# Patient Record
Sex: Male | Born: 1955 | Race: White | Hispanic: No | Marital: Married | Smoking: Current some day smoker
Health system: Southern US, Community
[De-identification: ages and names within clinical notes are randomized; demographics above are authoritative.]

## PROBLEM LIST (undated history)

## (undated) DIAGNOSIS — S43109A Unspecified dislocation of unspecified acromioclavicular joint, initial encounter: Secondary | ICD-10-CM

## (undated) DIAGNOSIS — S92309A Fracture of unspecified metatarsal bone(s), unspecified foot, initial encounter for closed fracture: Secondary | ICD-10-CM

## (undated) DIAGNOSIS — R55 Syncope and collapse: Secondary | ICD-10-CM

## (undated) DIAGNOSIS — I639 Cerebral infarction, unspecified: Secondary | ICD-10-CM

## (undated) DIAGNOSIS — I2510 Atherosclerotic heart disease of native coronary artery without angina pectoris: Secondary | ICD-10-CM

## (undated) DIAGNOSIS — Z720 Tobacco use: Secondary | ICD-10-CM

## (undated) HISTORY — PX: MANDIBLE RECONSTRUCTION: SHX431

## (undated) HISTORY — PX: TONSILLECTOMY: SUR1361

---

## 2012-06-21 ENCOUNTER — Emergency Department: Payer: Self-pay | Admitting: Emergency Medicine

## 2012-07-18 ENCOUNTER — Emergency Department: Payer: Self-pay | Admitting: Internal Medicine

## 2016-05-09 DIAGNOSIS — S43109A Unspecified dislocation of unspecified acromioclavicular joint, initial encounter: Secondary | ICD-10-CM | POA: Insufficient documentation

## 2016-05-21 DIAGNOSIS — S43102A Unspecified dislocation of left acromioclavicular joint, initial encounter: Secondary | ICD-10-CM | POA: Insufficient documentation

## 2016-09-10 ENCOUNTER — Encounter: Payer: Self-pay | Admitting: *Deleted

## 2016-09-10 NOTE — Patient Instructions (Signed)
  Your procedure is scheduled on: 09-11-16 Report to Same Day Surgery 2nd Floor @ 8:30-PT INFORMED OF THIS   Remember: Instructions that are not followed completely may result in serious medical risk, up to and including death, or upon the discretion of your surgeon and anesthesiologist your surgery may need to be rescheduled.    _x___ 1. Do not eat food or drink liquids after midnight. No gum chewing or hard candies.     __x__ 2. No Alcohol for 24 hours before or after surgery.   __x__3. No Smoking for 24 prior to surgery.   ____  4. Bring all medications with you on the day of surgery if instructed.    __x__ 5. Notify your doctor if there is any change in your medical condition     (cold, fever, infections).     Do not wear jewelry, make-up, hairpins, clips or nail polish.  Do not wear lotions, powders, or perfumes. You may wear deodorant.  Do not shave 48 hours prior to surgery. Men may shave face and neck.  Do not bring valuables to the hospital.    [facility] is not responsible for any belongings or valuables.               Contacts, dentures or bridgework may not be worn into surgery.  Leave your suitcase in the car. After surgery it may be brought to your room.  For patients admitted to the hospital, discharge time is determined by your treatment team.   Patients discharged the day of surgery will not be allowed to drive home.    Please read over the following fact sheets that you were given:   [facility] Preparing for Surgery and or MRSA Information   ____ Take these medicines the morning of surgery with A SIP OF WATER:    1. NONE  2.  3.  4.  5.  6.  ____Fleets enema or Magnesium Citrate as directed.   ____ Use CHG Soap or sage wipes as directed on instruction sheet   ____ Use inhalers on the day of surgery and bring to hospital day of surgery  ____ Stop metformin 2 days prior to surgery    ____ Take 1/2 of usual insulin dose the night before surgery and  none on the morning of surgery.   ____ Stop aspirin or coumadin, or plavix  x__ Stop Anti-inflammatories such as Advil, Aleve, Ibuprofen, Motrin, Naproxen,          Naprosyn, Goodies powders or aspirin products. Ok to take Tylenol.   ____ Stop supplements until after surgery.    ____ Bring C-Pap to the hospital.

## 2016-09-10 NOTE — Pre-Procedure Instructions (Signed)
ECG 12-lead6/10/2016 [place] Component Name Value Ref Range  Vent Rate (bpm) 95   PR Interval (msec) 128   QRS Interval (msec) 86   QT Interval (msec) 358   QTc (msec) 449   Result Narrative  Sinus rhythm Multiple ventricular premature complexes Otherwise normal ECG  No previous ECGs available I reviewed and concur with this report. Electronically signed [id number], MD, AUGUSTUS ([id number]) on 05/06/2016 9:49:50 AM  Status Results Details    Hospital Encounter on 05/03/2016 [place]")' [email or web address] Summary

## 2016-09-11 ENCOUNTER — Encounter: Payer: Self-pay | Admitting: Anesthesiology

## 2016-09-11 ENCOUNTER — Ambulatory Visit
Admission: RE | Admit: 2016-09-11 | Discharge: 2016-09-11 | Disposition: A | Discharge status: Home / Self Care | Payer: Medicaid Other | Source: Ambulatory Visit | Attending: Surgery | Admitting: Surgery

## 2016-09-11 ENCOUNTER — Ambulatory Visit: Payer: Medicaid Other | Admitting: Anesthesiology

## 2016-09-11 ENCOUNTER — Encounter
Admission: RE | Disposition: A | Discharge status: Home / Self Care | Payer: Self-pay | Source: Ambulatory Visit | Attending: Surgery

## 2016-09-11 ENCOUNTER — Ambulatory Visit: Payer: Medicaid Other

## 2016-09-11 DIAGNOSIS — M24812 Other specific joint derangements of left shoulder, not elsewhere classified: Secondary | ICD-10-CM | POA: Insufficient documentation

## 2016-09-11 DIAGNOSIS — F17200 Nicotine dependence, unspecified, uncomplicated: Secondary | ICD-10-CM | POA: Insufficient documentation

## 2016-09-11 DIAGNOSIS — Z79899 Other long term (current) drug therapy: Secondary | ICD-10-CM | POA: Diagnosis not present

## 2016-09-11 DIAGNOSIS — Z419 Encounter for procedure for purposes other than remedying health state, unspecified: Secondary | ICD-10-CM

## 2016-09-11 HISTORY — PX: ACROMIO-CLAVICULAR JOINT REPAIR: SHX5183

## 2016-09-11 SURGERY — REPAIR, ACROMIOCLAVICULAR JOINT
Anesthesia: General | Laterality: Left | Wound class: Clean

## 2016-09-11 MED ORDER — PROPOFOL 10 MG/ML IV BOLUS
INTRAVENOUS | Status: DC | PRN
  Administered 2016-09-11: 200 mg via INTRAVENOUS

## 2016-09-11 MED ORDER — ONDANSETRON HCL 4 MG/2ML IJ SOLN: 4.0000 mg | Freq: Once | INTRAMUSCULAR | Status: DC | PRN

## 2016-09-11 MED ORDER — LIDOCAINE HCL (CARDIAC) 20 MG/ML IV SOLN
INTRAVENOUS | Status: DC | PRN
  Administered 2016-09-11: 50 mg via INTRAVENOUS

## 2016-09-11 MED ORDER — CEFAZOLIN SODIUM-DEXTROSE 2-4 GM/100ML-% IV SOLN
INTRAVENOUS | Status: AC
  Filled 2016-09-11: qty 100

## 2016-09-11 MED ORDER — ROCURONIUM BROMIDE 100 MG/10ML IV SOLN
INTRAVENOUS | Status: DC | PRN
  Administered 2016-09-11: 10 mg via INTRAVENOUS
  Administered 2016-09-11: 20 mg via INTRAVENOUS
  Administered 2016-09-11: 40 mg via INTRAVENOUS

## 2016-09-11 MED ORDER — FAMOTIDINE 20 MG PO TABS
20.0000 mg | ORAL_TABLET | Freq: Once | ORAL | Status: AC
  Administered 2016-09-11: 20 mg via ORAL

## 2016-09-11 MED ORDER — CEFAZOLIN SODIUM-DEXTROSE 2-4 GM/100ML-% IV SOLN
2.0000 g | Freq: Once | INTRAVENOUS | Status: AC
  Administered 2016-09-11: 2 g via INTRAVENOUS

## 2016-09-11 MED ORDER — FENTANYL CITRATE (PF) 100 MCG/2ML IJ SOLN: 25.0000 ug | INTRAMUSCULAR | Status: DC | PRN

## 2016-09-11 MED ORDER — MIDAZOLAM HCL 2 MG/2ML IJ SOLN
1.0000 mg | Freq: Once | INTRAMUSCULAR | Status: AC
  Administered 2016-09-11: 1 mg via INTRAVENOUS

## 2016-09-11 MED ORDER — ACETAMINOPHEN 10 MG/ML IV SOLN
INTRAVENOUS | Status: AC
  Filled 2016-09-11: qty 100

## 2016-09-11 MED ORDER — FAMOTIDINE 20 MG PO TABS
ORAL_TABLET | ORAL | Status: AC
  Administered 2016-09-11: 20 mg via ORAL
  Filled 2016-09-11: qty 1

## 2016-09-11 MED ORDER — OXYCODONE HCL 5 MG PO TABS: 5.0000 mg | ORAL_TABLET | ORAL | 0 refills | Status: DC | PRN

## 2016-09-11 MED ORDER — FENTANYL CITRATE (PF) 100 MCG/2ML IJ SOLN
50.0000 ug | Freq: Once | INTRAMUSCULAR | Status: AC
  Administered 2016-09-11: 50 ug via INTRAVENOUS

## 2016-09-11 MED ORDER — HYDROMORPHONE HCL 1 MG/ML IJ SOLN
INTRAMUSCULAR | Status: DC | PRN
  Administered 2016-09-11: 0.5 mg via INTRAVENOUS

## 2016-09-11 MED ORDER — PHENYLEPHRINE HCL 10 MG/ML IJ SOLN
INTRAMUSCULAR | Status: DC | PRN
Start: 2016-09-11 — End: 2016-09-11
  Administered 2016-09-11 (×2): 100 ug via INTRAVENOUS
  Administered 2016-09-11 (×4): 200 ug via INTRAVENOUS

## 2016-09-11 MED ORDER — FENTANYL CITRATE (PF) 100 MCG/2ML IJ SOLN
INTRAMUSCULAR | Status: AC
  Administered 2016-09-11: 50 ug via INTRAVENOUS
  Filled 2016-09-11: qty 2

## 2016-09-11 MED ORDER — NEOMYCIN-POLYMYXIN B GU 40-200000 IR SOLN
Status: DC | PRN
  Administered 2016-09-11: 4 mL

## 2016-09-11 MED ORDER — SODIUM CHLORIDE 0.9 % IV SOLN
INTRAVENOUS | Status: DC | PRN
Start: 2016-09-11 — End: 2016-09-11
  Administered 2016-09-11: 50 ug/min via INTRAVENOUS

## 2016-09-11 MED ORDER — MIDAZOLAM HCL 5 MG/5ML IJ SOLN
INTRAMUSCULAR | Status: AC
  Administered 2016-09-11: 1 mg via INTRAVENOUS
  Filled 2016-09-11: qty 5

## 2016-09-11 MED ORDER — LACTATED RINGERS IV SOLN
INTRAVENOUS | Status: DC | PRN
  Administered 2016-09-11 (×2): via INTRAVENOUS

## 2016-09-11 MED ORDER — DEXAMETHASONE SODIUM PHOSPHATE 10 MG/ML IJ SOLN
INTRAMUSCULAR | Status: DC | PRN
  Administered 2016-09-11: 10 mg via INTRAVENOUS

## 2016-09-11 MED ORDER — BUPIVACAINE-EPINEPHRINE (PF) 0.5% -1:200000 IJ SOLN
INTRAMUSCULAR | Status: DC | PRN
  Administered 2016-09-11: 30 mL via PERINEURAL

## 2016-09-11 MED ORDER — BUPIVACAINE-EPINEPHRINE (PF) 0.5% -1:200000 IJ SOLN
INTRAMUSCULAR | Status: AC
  Filled 2016-09-11: qty 30

## 2016-09-11 MED ORDER — SUGAMMADEX SODIUM 200 MG/2ML IV SOLN
INTRAVENOUS | Status: DC | PRN
  Administered 2016-09-11: 130 mg via INTRAVENOUS

## 2016-09-11 MED ORDER — ACETAMINOPHEN 10 MG/ML IV SOLN
INTRAVENOUS | Status: DC | PRN
  Administered 2016-09-11: 1000 mg via INTRAVENOUS

## 2016-09-11 MED ORDER — LACTATED RINGERS IV SOLN
INTRAVENOUS | Status: DC
  Administered 2016-09-11: 09:00:00 via INTRAVENOUS

## 2016-09-11 MED ORDER — ONDANSETRON HCL 4 MG/2ML IJ SOLN
INTRAMUSCULAR | Status: DC | PRN
  Administered 2016-09-11: 4 mg via INTRAVENOUS

## 2016-09-11 MED ORDER — NEOMYCIN-POLYMYXIN B GU 40-200000 IR SOLN
Status: AC
  Filled 2016-09-11: qty 4

## 2016-09-11 MED ORDER — MIDAZOLAM HCL 2 MG/2ML IJ SOLN
INTRAMUSCULAR | Status: DC | PRN
  Administered 2016-09-11: 2 mg via INTRAVENOUS

## 2016-09-11 SURGICAL SUPPLY — 58 items
Acu-Sinch Repair System ×3 IMPLANT
BANDAGE ACE 4X5 VEL STRL LF (GAUZE/BANDAGES/DRESSINGS) ×6 IMPLANT
BIT DRILL 2.5 X LONG (BIT) ×1
BIT DRILL X LONG 2.5 (BIT) ×1 IMPLANT
BLADE SAW 9.0X18.5X0.4 (BLADE) ×3 IMPLANT
BNDG COHESIVE 4X5 TAN STRL (GAUZE/BANDAGES/DRESSINGS) ×3 IMPLANT
CANISTER SUCT 1200ML W/VALVE (MISCELLANEOUS) ×3 IMPLANT
CHLORAPREP W/TINT 26ML (MISCELLANEOUS) ×6 IMPLANT
DRAPE C-ARM XRAY 36X54 (DRAPES) ×9 IMPLANT
DRAPE IMP U-DRAPE 54X76 (DRAPES) ×6 IMPLANT
DRAPE INCISE IOBAN 66X45 STRL (DRAPES) ×6 IMPLANT
DRAPE U-SHAPE 47X51 STRL (DRAPES) ×3 IMPLANT
DRILL BIT X LONG 2.5 (BIT) ×2
DRSG OPSITE POSTOP 4X10 (GAUZE/BANDAGES/DRESSINGS) ×3 IMPLANT
DRSG OPSITE POSTOP 4X8 (GAUZE/BANDAGES/DRESSINGS) ×6 IMPLANT
ELECT CAUTERY BLADE 6.4 (BLADE) ×3 IMPLANT
ELECT REM PT RETURN 9FT ADLT (ELECTROSURGICAL) ×3
ELECTRODE REM PT RTRN 9FT ADLT (ELECTROSURGICAL) ×1 IMPLANT
GAUZE PETRO XEROFOAM 1X8 (MISCELLANEOUS) ×3 IMPLANT
GAUZE SPONGE 4X4 12PLY STRL (GAUZE/BANDAGES/DRESSINGS) ×3 IMPLANT
GLOVE BIO SURGEON STRL SZ7.5 (GLOVE) ×6 IMPLANT
GLOVE BIO SURGEON STRL SZ8 (GLOVE) ×6 IMPLANT
GLOVE BIOGEL PI IND STRL 8 (GLOVE) ×1 IMPLANT
GLOVE BIOGEL PI INDICATOR 8 (GLOVE) ×2
GLOVE INDICATOR 8.0 STRL GRN (GLOVE) ×3 IMPLANT
GOWN STRL REUS W/ TWL LRG LVL3 (GOWN DISPOSABLE) ×2 IMPLANT
GOWN STRL REUS W/ TWL XL LVL3 (GOWN DISPOSABLE) ×1 IMPLANT
GOWN STRL REUS W/TWL LRG LVL3 (GOWN DISPOSABLE) ×4
GOWN STRL REUS W/TWL XL LVL3 (GOWN DISPOSABLE) ×2
HANDLE YANKAUER SUCT BULB TIP (MISCELLANEOUS) ×3 IMPLANT
IMMOBILIZER SHDR LG LX 900803 (SOFTGOODS) ×3 IMPLANT
KIT ACU SINCH FIX STRL (KITS) ×3
KIT RM TURNOVER STRD PROC AR (KITS) ×3 IMPLANT
KIT STABILIZATION SHOULDER (MISCELLANEOUS) ×3 IMPLANT
MASK FACE SPIDER DISP (MASK) ×3 IMPLANT
NEEDLE HYPO 25X1 1.5 SAFETY (NEEDLE) ×3 IMPLANT
NEEDLE MAYO 6 CRC TAPER PT (NEEDLE) IMPLANT
NS IRRIG 1000ML POUR BTL (IV SOLUTION) ×3 IMPLANT
PACK ARTHROSCOPY SHOULDER (MISCELLANEOUS) ×3 IMPLANT
PAD CAST CTTN 4X4 STRL (SOFTGOODS) IMPLANT
PADDING CAST COTTON 4X4 STRL (SOFTGOODS)
PLATE 3.5MM 4H 18MM HOOK (Plate) ×3 IMPLANT
RETRIEVER SUT LRG (INSTRUMENTS) ×3 IMPLANT
SCREW CORTEX 3.5 18MM (Screw) ×4 IMPLANT
SCREW CORTEX 3.5 20MM (Screw) ×6 IMPLANT
SCREW LOCK CORT ST 3.5X18 (Screw) ×2 IMPLANT
SCREW LOCK CORT ST 3.5X20 (Screw) ×3 IMPLANT
SET ACU-SINCH FIX REPAIR STRL (KITS) ×1 IMPLANT
SLING ULTRA II LG (MISCELLANEOUS) ×3 IMPLANT
STAPLER SKIN PROX 35W (STAPLE) ×3 IMPLANT
STOCKINETTE IMPERVIOUS 9X36 MD (GAUZE/BANDAGES/DRESSINGS) ×3 IMPLANT
SUT ETHIBOND 0 MO6 C/R (SUTURE) ×3 IMPLANT
SUT FIBERWIRE #2 38 BLUE 1/2 (SUTURE)
SUT PROLENE 4 0 PS 2 18 (SUTURE) ×6 IMPLANT
SUT VIC AB 2-0 CT1 27 (SUTURE) ×8
SUT VIC AB 2-0 CT1 TAPERPNT 27 (SUTURE) ×4 IMPLANT
SUTURE FIBERWR #2 38 BLUE 1/2 (SUTURE) IMPLANT
SYRINGE 10CC LL (SYRINGE) ×3 IMPLANT

## 2016-09-11 NOTE — Anesthesia Preprocedure Evaluation (Addendum)
Anesthesia Evaluation  Patient identified by MRN, date of birth, ID band Patient awake    Reviewed: Allergy & Precautions, NPO status , Patient's Chart, lab work & pertinent test results  Airway Mallampati: II  TM Distance: >3 FB     Dental  (+) Poor Dentition   Pulmonary Current Smoker,    Pulmonary exam normal        Cardiovascular negative cardio ROS Normal cardiovascular exam     Neuro/Psych negative psych ROS   GI/Hepatic negative GI ROS, Neg liver ROS,   Endo/Other  negative endocrine ROS  Renal/GU negative Renal ROS  negative genitourinary   Musculoskeletal negative musculoskeletal ROS (+)   Abdominal Normal abdominal exam  (+)   Peds negative pediatric ROS (+)  Hematology negative hematology ROS (+)   Anesthesia Other Findings History reviewed. No pertinent past medical history.  Reproductive/Obstetrics                            Anesthesia Physical Anesthesia Plan  ASA: II  Anesthesia Plan: General   Post-op Pain Management:  Regional for Post-op pain   Induction: Intravenous  Airway Management Planned: Oral ETT  Additional Equipment:   Intra-op Plan:   Post-operative Plan: Extubation in OR  Informed Consent: I have reviewed the patients History and Physical, chart, labs and discussed the procedure including the risks, benefits and alternatives for the proposed anesthesia with the patient or authorized representative who has indicated his/her understanding and acceptance.   Dental advisory given  Plan Discussed with: CRNA and Surgeon  Anesthesia Plan Comments: (Talked with patient about interscalene nerve block for post op pain.  Patient understands risks and agrees with the procedure)       Anesthesia Quick Evaluation

## 2016-09-11 NOTE — Anesthesia Postprocedure Evaluation (Signed)
Anesthesia Post Note  Patient: Kyle Larson  Procedure(s) Performed: Procedure(s) (LRB): ACROMIO-CLAVICULAR RECONSTRUCTION (Left)  Patient location during evaluation: PACU Anesthesia Type: General Level of consciousness: awake and alert and oriented Pain management: pain level controlled Vital Signs Assessment: post-procedure vital signs reviewed and stable Respiratory status: spontaneous breathing Cardiovascular status: blood pressure returned to baseline Anesthetic complications: no    Last Vitals:  Vitals:   09/11/16 1358 09/11/16 1408  BP: 140/84 (!) 150/85  Pulse: 95 (!) 101  Resp: (!) 22 16  Temp: 36.7 C 36.8 C    Last Pain:  Vitals:   09/11/16 1408  TempSrc: Temporal  PainSc:                  [name]

## 2016-09-11 NOTE — Anesthesia Procedure Notes (Signed)
Anesthesia Regional Block:  Interscalene brachial plexus block  Pre-Anesthetic Checklist: ,, timeout performed, Correct Patient, Correct Site, Correct Laterality, Correct Procedure, Correct Position, site marked, Risks and benefits discussed,  Surgical consent,  Pre-op evaluation,  At surgeon's request and post-op pain management  Laterality: Left  Prep: Maximum Sterile Barrier Precautions used, chloraprep, alcohol swabs       Needles:  Injection technique: Single-shot  Needle Type: Stimiplex     Needle Length: 5cm 5 cm Needle Gauge: 22 and 22 G    Additional Needles:  Procedures: ultrasound guided (picture in chart) and nerve stimulator Interscalene brachial plexus block  Nerve Stimulator or Paresthesia:  Response: biceps flexion, 0.5 mA,   Additional Responses:   Narrative:  Start time: 09/11/2016 10:25 AM End time: 09/11/2016 10:35 AM Injection made incrementally with aspirations every 5 mL.  Performed by: Personally  Anesthesiologist: Alvin Critchley  Additional Notes: Time out was called.  A roll was placed under the left side of the back to present the left neck.  The left neck was prepped and draped in sterile fashion.  An US probe was used to identify the brachial plexus in the typical stoplight position at approximately the C6 position.  A skin wheal was made lateral to the probe with 1% Lidocaine plain.  A 22G stimuplex needle was advanced to the nerve bundle with the elicitation of a biceps twitch and fade at 0.5 mAmps.  Incremental injection of 30 cc of a mixture of 0.5% and 0.2% Ropivaciane plain was used with easy injection and good spread.  The patient had no pain on injection and the local was easy to inject.  Patient tolerated the procedure well.
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
## 2016-09-11 NOTE — Discharge Instructions (Addendum)
May shower with intact OpSite dressing.  Apply ice frequently to shoulder. Take ibuprofen 800 mg TID with meals for 7-10 days, then as necessary. Take oxycodone as prescribed when needed.  May supplement with ES Tylenol if necessary. Keep shoulder immobilizer on at all times except may remove for bathing purposes. Follow-up in 10-14 days or as scheduled.   AMBULATORY SURGERY  DISCHARGE INSTRUCTIONS   1) The drugs that you were given will stay in your system until tomorrow so for the next 24 hours you should not:  A) Drive an automobile B) Make any legal decisions C) Drink any alcoholic beverage   2) You may resume regular meals tomorrow.  Today it is better to start with liquids and gradually work up to solid foods.  You may eat anything you prefer, but it is better to start with liquids, then soup and crackers, and gradually work up to solid foods.   3) Please notify your doctor immediately if you have any unusual bleeding, trouble breathing, redness and pain at the surgery site, drainage, fever, or pain not relieved by medication.    4) Additional Instructions:        Please contact your physician with any problems or Same Day Surgery at [phone], Monday through Friday 6 am to 4 pm, or [place] [facility] number at [phone].

## 2016-09-11 NOTE — Anesthesia Procedure Notes (Signed)
Procedure Name: Intubation Date/Time: 09/11/2016 10:59 AM Performed by: Darlyne Russian Pre-anesthesia Checklist: Patient identified, Emergency Drugs available, Suction available and Patient being monitored Patient Re-evaluated:Patient Re-evaluated prior to inductionOxygen Delivery Method: Circle system utilized Preoxygenation: Pre-oxygenation with 100% oxygen Intubation Type: IV induction Ventilation: Mask ventilation without difficulty Laryngoscope Size: Mac and 4 Grade View: Grade II Tube type: Oral Tube size: 7.5 mm Number of attempts: 1 Airway Equipment and Method: Stylet (Soft rolled gauze bite block placed between left molars.) Placement Confirmation: ETT inserted through vocal cords under direct vision,  positive ETCO2 and breath sounds checked- equal and bilateral Secured at: 22 cm Tube secured with: Tape Dental Injury: Teeth and Oropharynx as per pre-operative assessment

## 2016-09-11 NOTE — H&P (Signed)
Paper H&P to be scanned into permanent record. H&P reviewed. No changes. 

## 2016-09-11 NOTE — Transfer of Care (Signed)
Immediate Anesthesia Transfer of Care Note  Patient: Kyle Larson  Procedure(s) Performed: Procedure(s) with comments: ACROMIO-CLAVICULAR RECONSTRUCTION (Left) - clavicle  Patient Location: PACU  Anesthesia Type:General and Regional  Level of Consciousness: unresponsive  Airway & Oxygen Therapy: Patient Spontanous Breathing and Patient connected to nasal cannula oxygen  Post-op Assessment: Report given to RN and Post -op Vital signs reviewed and stable  Post vital signs: Reviewed and stable  Last Vitals:  Vitals:   09/11/16 1039 09/11/16 1313  BP: (!) 137/95 120/79  Pulse: 89 73  Resp: (!) 21 12  Temp:  36.3 C    Last Pain:  Vitals:   09/11/16 1313  TempSrc:   PainSc: Asleep         Complications: No apparent anesthesia complications

## 2016-09-12 ENCOUNTER — Encounter: Payer: Self-pay | Admitting: Surgery

## 2016-09-15 ENCOUNTER — Encounter: Payer: Self-pay | Admitting: Surgery

## 2016-09-15 LAB — SURGICAL PATHOLOGY

## 2016-10-03 ENCOUNTER — Emergency Department
Admission: EM | Admit: 2016-10-03 | Discharge: 2016-10-03 | Disposition: A | Discharge status: Home / Self Care | Payer: Medicaid Other | Attending: Emergency Medicine | Admitting: Emergency Medicine

## 2016-10-03 ENCOUNTER — Emergency Department: Payer: Medicaid Other

## 2016-10-03 DIAGNOSIS — F17210 Nicotine dependence, cigarettes, uncomplicated: Secondary | ICD-10-CM | POA: Diagnosis not present

## 2016-10-03 DIAGNOSIS — Z79899 Other long term (current) drug therapy: Secondary | ICD-10-CM | POA: Insufficient documentation

## 2016-10-03 DIAGNOSIS — R079 Chest pain, unspecified: Secondary | ICD-10-CM

## 2016-10-03 DIAGNOSIS — R0602 Shortness of breath: Secondary | ICD-10-CM | POA: Insufficient documentation

## 2016-10-03 DIAGNOSIS — R0789 Other chest pain: Secondary | ICD-10-CM | POA: Diagnosis not present

## 2016-10-03 LAB — CBC
HCT: 47.6 % (ref 40.0–52.0)
Hemoglobin: 16.5 g/dL (ref 13.0–18.0)
MCH: 34.2 pg — AB (ref 26.0–34.0)
MCHC: 34.7 g/dL (ref 32.0–36.0)
MCV: 98.7 fL (ref 80.0–100.0)
PLATELETS: 263 10*3/uL (ref 150–440)
RBC: 4.82 MIL/uL (ref 4.40–5.90)
RDW: 13.5 % (ref 11.5–14.5)
WBC: 9.5 10*3/uL (ref 3.8–10.6)

## 2016-10-03 LAB — BASIC METABOLIC PANEL
Anion gap: 9 (ref 5–15)
BUN: 6 mg/dL (ref 6–20)
CHLORIDE: 98 mmol/L — AB (ref 101–111)
CO2: 27 mmol/L (ref 22–32)
CREATININE: 0.74 mg/dL (ref 0.61–1.24)
Calcium: 9.7 mg/dL (ref 8.9–10.3)
GFR calc non Af Amer: >60 mL/min (ref >60)
Glucose, Bld: 124 mg/dL — ABNORMAL HIGH (ref 65–99)
Potassium: 4.5 mmol/L (ref 3.5–5.1)
Sodium: 134 mmol/L — ABNORMAL LOW (ref 135–145)

## 2016-10-03 LAB — TROPONIN I
Troponin I: <0.03 ng/mL (ref <0.03)
Troponin I: <0.03 ng/mL (ref <0.03)

## 2016-10-03 NOTE — ED Notes (Addendum)
Pt reports chest pain and shortness of breath - symptoms started last night - chest pain is located in center of chest - pt reports he feels like his heart is going to jump out of his chest and reports pressure to center of chest - pt father had nitro and he took one tablet with relief obtained - pt reports elevated pulse last night - shortness of breath is noted to be worse with exertion but pt reports he is unable to catch his breath since this am - reports being light headed - at this time pt respirations are even and unlabored and he appears in no acute respiratory distress
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
## 2016-10-03 NOTE — Discharge Instructions (Signed)
Please seek medical attention for any high fevers, chest pain, shortness of breath, change in behavior, persistent vomiting, bloody stool or any other new or concerning symptoms.  

## 2016-10-03 NOTE — ED Triage Notes (Signed)
Pt c/o SOB and centralized CP that began last night. Pt feels like he can't take a deep breath. Pt alert and oriented X4, active, cooperative, pt in NAD. RR even and unlabored, color WNL.

## 2016-10-03 NOTE — ED Notes (Signed)
MD Archie Balboa notified of elevated BP - no new orders for BP treatment at this time - VO given for troponin level redraw

## 2019-01-18 ENCOUNTER — Ambulatory Visit: Payer: Self-pay | Admitting: Unknown Physician Specialty

## 2019-01-18 ENCOUNTER — Encounter: Payer: Self-pay | Admitting: Unknown Physician Specialty

## 2019-01-18 VITALS — BP 130/75 | HR 87 | Ht 71.0 in | Wt 145.0 lb

## 2019-01-18 DIAGNOSIS — Z024 Encounter for examination for driving license: Secondary | ICD-10-CM

## 2019-01-18 NOTE — Progress Notes (Signed)
   BP 130/75   Pulse 87   Ht 5\' 11"  (1.803 m)   Wt 145 lb (65.8 kg)   BMI 20.22 kg/m    Subjective:    Patient ID: Kyle Larson, male    DOB: [birth year], 63 y.o.   MRN: [id number]  HPI: Kyle Larson is a 63 y.o. male  Chief Complaint  Patient presents with  . DOT Physical   See form  Relevant past medical, surgical, family and social history reviewed and updated as indicated. Interim medical history since our last visit reviewed. Allergies and medications reviewed and updated.  Review of Systems  Per HPI unless specifically indicated above     Objective:    BP 130/75   Pulse 87   Ht 5\' 11"  (1.803 m)   Wt 145 lb (65.8 kg)   BMI 20.22 kg/m   Wt Readings from Last 3 Encounters:  01/18/19 145 lb (65.8 kg)  10/03/16 140 lb (63.5 kg)  09/11/16 140 lb (63.5 kg)    Physical Exam  See form Results for orders placed or performed during the hospital encounter of 27/78/24  Basic metabolic panel  Result Value Ref Range   Sodium 134 (L) 135 - 145 mmol/L   Potassium 4.5 3.5 - 5.1 mmol/L   Chloride 98 (L) 101 - 111 mmol/L   CO2 27 22 - 32 mmol/L   Glucose, Bld 124 (H) 65 - 99 mg/dL   BUN 6 6 - 20 mg/dL   Creatinine, Ser 0.74 0.61 - 1.24 mg/dL   Calcium 9.7 8.9 - 10.3 mg/dL   GFR calc non Af Amer >60 >60 mL/min   GFR calc Af Amer >60 >60 mL/min   Anion gap 9 5 - 15  CBC  Result Value Ref Range   WBC 9.5 3.8 - 10.6 K/uL   RBC 4.82 4.40 - 5.90 MIL/uL   Hemoglobin 16.5 13.0 - 18.0 g/dL   HCT 47.6 40.0 - 52.0 %   MCV 98.7 80.0 - 100.0 fL   MCH 34.2 (H) 26.0 - 34.0 pg   MCHC 34.7 32.0 - 36.0 g/dL   RDW 13.5 11.5 - 14.5 %   Platelets 263 150 - 440 K/uL  Troponin I  Result Value Ref Range   Troponin I <0.03 <0.03 ng/mL  Troponin I  Result Value Ref Range   Troponin I <0.03 <0.03 ng/mL      Assessment & Plan:   Problem List Items Addressed This Visit    None    Visit Diagnoses    Encounter for commercial driving license (CDL) exam    -  Primary        Follow up plan: No follow-ups on file.   OK for 2 years.  Encouraged to be seen for regular physical with primary care provider

## 2020-03-22 ENCOUNTER — Ambulatory Visit: Payer: Self-pay | Admitting: General Surgery

## 2020-03-22 ENCOUNTER — Other Ambulatory Visit
Admission: RE | Admit: 2020-03-22 | Discharge: 2020-03-22 | Disposition: A | Discharge status: Home / Self Care | Payer: HRSA Program | Source: Ambulatory Visit | Attending: General Surgery | Admitting: General Surgery

## 2020-03-22 DIAGNOSIS — Z01812 Encounter for preprocedural laboratory examination: Secondary | ICD-10-CM | POA: Insufficient documentation

## 2020-03-22 DIAGNOSIS — Z20822 Contact with and (suspected) exposure to covid-19: Secondary | ICD-10-CM | POA: Insufficient documentation

## 2020-03-22 NOTE — H&P (View-Only) (Signed)
PATIENT PROFILE: Kyle Larson is a 64 y.o. male who presents to the Clinic for consultation at the request of Dr. Sherilyn Cooter for evaluation of right inguinal hernia.  PCP:  None  HISTORY OF PRESENT ILLNESS: Mr. Kyle Larson reports he knew that he had inguinal hernia years ago but yesterday he was doing heavy lifting and the hernia popped out.  He reported it was very painful was not able to reduce it.  He put ice but he came down a little bit.  He denies any pain radiation.  Aggravating factor is trying to reduce the hernia.  There is no alleviating factors.  Patient denies any abdominal distention nausea or vomiting.  Patient denies any previous hernia repair.  Patient went to the urgent clinic this morning for evaluation.  Hernia was unable to be reduced by urgent care physician and urgent consult was sent to surgery department.   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      GENERAL REVIEW OF SYSTEMS:   General ROS: negative for - chills, fatigue, fever, weight gain or weight loss Allergy and Immunology ROS: negative for - hives  Hematological and Lymphatic ROS: negative for - bleeding problems or bruising, negative for palpable nodes Endocrine ROS: negative for - heat or cold intolerance, hair changes Respiratory ROS: negative for - cough, shortness of breath or wheezing Cardiovascular ROS: no chest pain or palpitations GI ROS: negative for nausea, vomiting, abdominal pain, diarrhea, constipation Musculoskeletal ROS: negative for - joint swelling or muscle pain Neurological ROS: negative for - confusion, syncope Dermatological ROS: negative for pruritus and rash Psychiatric: negative for anxiety, depression, difficulty sleeping and memory loss  MEDICATIONS: Current Outpatient Medications  Medication Sig Dispense Refill  . multivitamin (MULTIVITAMIN) tablet Take by mouth        No current  facility-administered medications for this visit.    ALLERGIES: Patient has no known allergies.  PAST MEDICAL HISTORY: Past Medical History:  Diagnosis Date  . Abusive head trauma     PAST SURGICAL HISTORY: Past Surgical History:  Procedure Laterality Date  . open reduction and internal fixation of displaced type III A-C seperation left shoulder  Left 09/11/2016   Dr.Poggi   . TONSILLECTOMY     64 yrs old     FAMILY HISTORY: Family History  Problem Relation Age of Onset  . No Known Problems Mother   . No Known Problems Father      SOCIAL HISTORY: Social History   Socioeconomic History  . Marital status: Married    Spouse name: Not on file  . Number of children: Not on file  . Years of education: Not on file  . Highest education level: Not on file  Occupational History  . Not on file  Tobacco Use  . Smoking status: Current Every Day Smoker    Packs/day: 0.50  . Smokeless tobacco: Never Used  Vaping Use  . Vaping Use: Never used  Substance and Sexual Activity  . Alcohol use: No  . Drug use: No  . Sexual activity: Yes  Other Topics Concern  . Not on file  Social History Narrative  . Not on file   Social Determinants of Health   Financial Resource Strain:   . Difficulty of Paying Living Expenses:   Food Insecurity:   . Worried About Charity fundraiser in the Last Year:   . Arboriculturist in  the Last Year:   Transportation Needs:   . Film/video editor (Medical):   Marland Kitchen Lack of Transportation (Non-Medical):     PHYSICAL EXAM: Vitals:   03/22/20 1303  BP: 154/88  Pulse: 88   Body mass index is 19.67 kg/m. Weight: 65.8 kg (145 lb)   GENERAL: Alert, active, oriented x3  HEENT: Pupils equal reactive to light. Extraocular movements are intact. Sclera clear. Palpebral conjunctiva normal red color.Pharynx clear.  NECK: Supple with no palpable mass and no adenopathy.  LUNGS: Sound clear with no rales rhonchi or wheezes.  HEART: Regular rhythm  S1 and S2 without murmur.  ABDOMEN: Soft and depressible, nontender with no palpable mass, no hepatomegaly.  Right groin hernia, reducible difficult on painful open reduction.  It came back quickly after he was able to be reduced.  EXTREMITIES: Well-developed well-nourished symmetrical with no dependent edema.  NEUROLOGICAL: Awake alert oriented, facial expression symmetrical, moving all extremities.  REVIEW OF DATA: I have reviewed the following data today: No visits with results within 3 Month(s) from this visit.  Latest known visit with results is:  Admission on 05/03/2016, Discharged on 05/06/2016  Component Date Value  . Sodium 05/03/2016 128*  . Potassium 05/03/2016 3.7   . Chloride 05/03/2016 93*  . Carbon Dioxide (CO2) 05/03/2016 21   . Urea Nitrogen (BUN) 05/03/2016 2*  . Creatinine 05/03/2016 0.7   . Glucose 05/03/2016 99   . Calcium 05/03/2016 8.8   . Anion Gap 05/03/2016 14*  . BUN/CREA Ratio 05/03/2016 3   . Glomerular Filtration Ra* 05/03/2016 >60   . Ethanol 05/03/2016 263*  . Acetaminophen 05/03/2016 <10*  . Salicylate Q000111Q A999333*  . WBC (White Blood Cell Co* 05/03/2016 10.7*  . Hemoglobin 05/03/2016 13.0*  . Hematocrit 05/03/2016 37.2*  . Platelets 05/03/2016 208   . MCV (Mean Corpuscular Vo* 05/03/2016 93   . MCH (Mean Corpuscular He* 05/03/2016 32.4   . MCHC (Mean Corpuscular H* 05/03/2016 34.9   . RBC (Red Blood Cell Coun* 05/03/2016 4.01*  . RDW-CV (Red Cell Distrib* 05/03/2016 13.1   . NRBC (Nucleated Red Bloo* 05/03/2016 0.00   . NRBC % (Nucleated Red Bl* 05/03/2016 0.0   . MPV (Mean Platelet Volum* 05/03/2016 10.7   . Neutrophil Count 05/03/2016 4.5   . Neutrophil % 05/03/2016 41.4   . Lymphocyte Count 05/03/2016 4.9*  . Lymphocyte % 05/03/2016 45.7   . Monocyte Count 05/03/2016 1.1*  . Monocyte % 05/03/2016 9.9   . Eosinophil Count 05/03/2016 0.19   . Eosinophil % 05/03/2016 1.8   . Basophil Count 05/03/2016 0.09   . Basophil % 05/03/2016  0.8   . Immature Granulocyte Cou* 05/03/2016 0.04   . Immature Granulocyte % 05/03/2016 0.4   . ABO RH TYPE 05/03/2016 O Positive   . Antibody Screen 05/03/2016 Negative   . Specimen Outdate 05/03/2016 05-06-2016 23:59   . Prothrombin Time 05/03/2016 10.2   . Prothrombin INR 05/03/2016 0.9   . Act Partial Thromboplast* 05/03/2016 22.2*  . POC Glucose 05/04/2016 126   . Vent Rate (bpm) 05/05/2016 95   . PR Interval (msec) 05/05/2016 128   . QRS Interval (msec) 05/05/2016 86   . QT Interval (msec) 05/05/2016 358   . QTc (msec) 05/05/2016 449   . WBC (White Blood Cell Co* 05/06/2016 12.8*  . Hemoglobin 05/06/2016 12.6*  . Hematocrit 05/06/2016 35.6*  . Platelets 05/06/2016 174   . MCV (Mean Corpuscular Vo* 05/06/2016 95   . MCH (Mean Corpuscular He* 05/06/2016  33.6   . MCHC (Mean Corpuscular H* 05/06/2016 35.4   . RBC (Red Blood Cell Coun* 05/06/2016 3.75*  . RDW-CV (Red Cell Distrib* 05/06/2016 13.2   . NRBC (Nucleated Red Bloo* 05/06/2016 0.00   . NRBC % (Nucleated Red Bl* 05/06/2016 0.0   . MPV (Mean Platelet Volum* 05/06/2016 11.0   . Sodium 05/06/2016 128*  . Potassium 05/06/2016 3.3*  . Chloride 05/06/2016 93*  . Carbon Dioxide (CO2) 05/06/2016 31*  . Urea Nitrogen (BUN) 05/06/2016 1*  . Creatinine 05/06/2016 0.5*  . Glucose 05/06/2016 94   . Calcium 05/06/2016 8.8   . Anion Gap 05/06/2016 4   . BUN/CREA Ratio 05/06/2016 2   . Glomerular Filtration Ra* 05/06/2016 >60   . Magnesium 05/06/2016 1.6*     ASSESSMENT: Mr. Relles is a 64 y.o. male presenting for consultation for right inguinal hernia.    The patient presents with a symptomatic, reducible inguinal hernia. Patient was oriented about the diagnosis of inguinal hernia and its implication. The patient was oriented about the treatment alternatives (observation vs surgical repair). Due to patient symptoms, repair is recommended. Patient oriented about the surgical procedure, the use of mesh and its risk of  complications such as: infection, bleeding, injury to vas deference, vasculature and testicle, injury to bowel or bladder, and chronic pain.   Patient only takes a multivitamin at home.  He denies any past medical cardiac or pulmonary history.  He denies chest pain or shortness of breath.  He is able to meet 4 METS.  He is pretty active without any exertional chest pain or shortness of breath.  Due to the significant pain upon trying to reduce the hernia and the fact that the hernia came back as soon as I reduced I will try to schedule the surgery as an urgent matter.  Seen he he is not n.p.o. I think there is reasonable to try to wait until tomorrow for the right inguinal hernia repair.  Patient understood and agreed with plan.  Non-recurrent unilateral inguinal hernia without obstruction or gangrene [K40.90]  PLAN: 1.  Robotic assisted laparoscopic right inguinal hernia repair with mesh [id number]) 2.  Avoid taking aspirin 5 days before procedure 3.  Contact us if has any question or concern.  Patient verbalized understanding, all questions were answered, and were agreeable with the plan outlined above.    Herbert Pun, MD  Electronically signed by Herbert Pun, MD

## 2020-03-22 NOTE — H&P (Signed)
PATIENT PROFILE: Kyle Larson is a 64 y.o. male who presents to the Clinic for consultation at the request of Dr. Sherilyn Cooter for evaluation of right inguinal hernia.  PCP:  None  HISTORY OF PRESENT ILLNESS: Kyle Larson reports he knew that he had inguinal hernia years ago but yesterday he was doing heavy lifting and the hernia popped out.  He reported it was very painful was not able to reduce it.  He put ice but he came down a little bit.  He denies any pain radiation.  Aggravating factor is trying to reduce the hernia.  There is no alleviating factors.  Patient denies any abdominal distention nausea or vomiting.  Patient denies any previous hernia repair.  Patient went to the urgent clinic this morning for evaluation.  Hernia was unable to be reduced by urgent care physician and urgent consult was sent to surgery department.   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      GENERAL REVIEW OF SYSTEMS:   General ROS: negative for - chills, fatigue, fever, weight gain or weight loss Allergy and Immunology ROS: negative for - hives  Hematological and Lymphatic ROS: negative for - bleeding problems or bruising, negative for palpable nodes Endocrine ROS: negative for - heat or cold intolerance, hair changes Respiratory ROS: negative for - cough, shortness of breath or wheezing Cardiovascular ROS: no chest pain or palpitations GI ROS: negative for nausea, vomiting, abdominal pain, diarrhea, constipation Musculoskeletal ROS: negative for - joint swelling or muscle pain Neurological ROS: negative for - confusion, syncope Dermatological ROS: negative for pruritus and rash Psychiatric: negative for anxiety, depression, difficulty sleeping and memory loss  MEDICATIONS: Current Outpatient Medications  Medication Sig Dispense Refill  . multivitamin (MULTIVITAMIN) tablet Take by mouth        No current  facility-administered medications for this visit.    ALLERGIES: Patient has no known allergies.  PAST MEDICAL HISTORY: Past Medical History:  Diagnosis Date  . Abusive head trauma     PAST SURGICAL HISTORY: Past Surgical History:  Procedure Laterality Date  . open reduction and internal fixation of displaced type III A-C seperation left shoulder  Left 09/11/2016   Dr.Poggi   . TONSILLECTOMY     64 yrs old     FAMILY HISTORY: Family History  Problem Relation Age of Onset  . No Known Problems Mother   . No Known Problems Father      SOCIAL HISTORY: Social History   Socioeconomic History  . Marital status: Married    Spouse name: Not on file  . Number of children: Not on file  . Years of education: Not on file  . Highest education level: Not on file  Occupational History  . Not on file  Tobacco Use  . Smoking status: Current Every Day Smoker    Packs/day: 0.50  . Smokeless tobacco: Never Used  Vaping Use  . Vaping Use: Never used  Substance and Sexual Activity  . Alcohol use: No  . Drug use: No  . Sexual activity: Yes  Other Topics Concern  . Not on file  Social History Narrative  . Not on file   Social Determinants of Health   Financial Resource Strain:   . Difficulty of Paying Living Expenses:   Food Insecurity:   . Worried About Charity fundraiser in the Last Year:   . Arboriculturist in  the Last Year:   Transportation Needs:   . Film/video editor (Medical):   Marland Kitchen Lack of Transportation (Non-Medical):     PHYSICAL EXAM: Vitals:   03/22/20 1303  BP: 154/88  Pulse: 88   Body mass index is 19.67 kg/m. Weight: 65.8 kg (145 lb)   GENERAL: Alert, active, oriented x3  HEENT: Pupils equal reactive to light. Extraocular movements are intact. Sclera clear. Palpebral conjunctiva normal red color.Pharynx clear.  NECK: Supple with no palpable mass and no adenopathy.  LUNGS: Sound clear with no rales rhonchi or wheezes.  HEART: Regular rhythm  S1 and S2 without murmur.  ABDOMEN: Soft and depressible, nontender with no palpable mass, no hepatomegaly.  Right groin hernia, reducible difficult on painful open reduction.  It came back quickly after he was able to be reduced.  EXTREMITIES: Well-developed well-nourished symmetrical with no dependent edema.  NEUROLOGICAL: Awake alert oriented, facial expression symmetrical, moving all extremities.  REVIEW OF DATA: I have reviewed the following data today: No visits with results within 3 Month(s) from this visit.  Latest known visit with results is:  Admission on 05/03/2016, Discharged on 05/06/2016  Component Date Value  . Sodium 05/03/2016 128*  . Potassium 05/03/2016 3.7   . Chloride 05/03/2016 93*  . Carbon Dioxide (CO2) 05/03/2016 21   . Urea Nitrogen (BUN) 05/03/2016 2*  . Creatinine 05/03/2016 0.7   . Glucose 05/03/2016 99   . Calcium 05/03/2016 8.8   . Anion Gap 05/03/2016 14*  . BUN/CREA Ratio 05/03/2016 3   . Glomerular Filtration Ra* 05/03/2016 >60   . Ethanol 05/03/2016 263*  . Acetaminophen 05/03/2016 <10*  . Salicylate Q000111Q A999333*  . WBC (White Blood Cell Co* 05/03/2016 10.7*  . Hemoglobin 05/03/2016 13.0*  . Hematocrit 05/03/2016 37.2*  . Platelets 05/03/2016 208   . MCV (Mean Corpuscular Vo* 05/03/2016 93   . MCH (Mean Corpuscular He* 05/03/2016 32.4   . MCHC (Mean Corpuscular H* 05/03/2016 34.9   . RBC (Red Blood Cell Coun* 05/03/2016 4.01*  . RDW-CV (Red Cell Distrib* 05/03/2016 13.1   . NRBC (Nucleated Red Bloo* 05/03/2016 0.00   . NRBC % (Nucleated Red Bl* 05/03/2016 0.0   . MPV (Mean Platelet Volum* 05/03/2016 10.7   . Neutrophil Count 05/03/2016 4.5   . Neutrophil % 05/03/2016 41.4   . Lymphocyte Count 05/03/2016 4.9*  . Lymphocyte % 05/03/2016 45.7   . Monocyte Count 05/03/2016 1.1*  . Monocyte % 05/03/2016 9.9   . Eosinophil Count 05/03/2016 0.19   . Eosinophil % 05/03/2016 1.8   . Basophil Count 05/03/2016 0.09   . Basophil % 05/03/2016  0.8   . Immature Granulocyte Cou* 05/03/2016 0.04   . Immature Granulocyte % 05/03/2016 0.4   . ABO RH TYPE 05/03/2016 O Positive   . Antibody Screen 05/03/2016 Negative   . Specimen Outdate 05/03/2016 05-06-2016 23:59   . Prothrombin Time 05/03/2016 10.2   . Prothrombin INR 05/03/2016 0.9   . Act Partial Thromboplast* 05/03/2016 22.2*  . POC Glucose 05/04/2016 126   . Vent Rate (bpm) 05/05/2016 95   . PR Interval (msec) 05/05/2016 128   . QRS Interval (msec) 05/05/2016 86   . QT Interval (msec) 05/05/2016 358   . QTc (msec) 05/05/2016 449   . WBC (White Blood Cell Co* 05/06/2016 12.8*  . Hemoglobin 05/06/2016 12.6*  . Hematocrit 05/06/2016 35.6*  . Platelets 05/06/2016 174   . MCV (Mean Corpuscular Vo* 05/06/2016 95   . MCH (Mean Corpuscular He* 05/06/2016  33.6   . MCHC (Mean Corpuscular H* 05/06/2016 35.4   . RBC (Red Blood Cell Coun* 05/06/2016 3.75*  . RDW-CV (Red Cell Distrib* 05/06/2016 13.2   . NRBC (Nucleated Red Bloo* 05/06/2016 0.00   . NRBC % (Nucleated Red Bl* 05/06/2016 0.0   . MPV (Mean Platelet Volum* 05/06/2016 11.0   . Sodium 05/06/2016 128*  . Potassium 05/06/2016 3.3*  . Chloride 05/06/2016 93*  . Carbon Dioxide (CO2) 05/06/2016 31*  . Urea Nitrogen (BUN) 05/06/2016 1*  . Creatinine 05/06/2016 0.5*  . Glucose 05/06/2016 94   . Calcium 05/06/2016 8.8   . Anion Gap 05/06/2016 4   . BUN/CREA Ratio 05/06/2016 2   . Glomerular Filtration Ra* 05/06/2016 >60   . Magnesium 05/06/2016 1.6*     ASSESSMENT: Kyle Larson is a 64 y.o. male presenting for consultation for right inguinal hernia.    The patient presents with a symptomatic, reducible inguinal hernia. Patient was oriented about the diagnosis of inguinal hernia and its implication. The patient was oriented about the treatment alternatives (observation vs surgical repair). Due to patient symptoms, repair is recommended. Patient oriented about the surgical procedure, the use of mesh and its risk of  complications such as: infection, bleeding, injury to vas deference, vasculature and testicle, injury to bowel or bladder, and chronic pain.   Patient only takes a multivitamin at home.  He denies any past medical cardiac or pulmonary history.  He denies chest pain or shortness of breath.  He is able to meet 4 METS.  He is pretty active without any exertional chest pain or shortness of breath.  Due to the significant pain upon trying to reduce the hernia and the fact that the hernia came back as soon as I reduced I will try to schedule the surgery as an urgent matter.  Seen he he is not n.p.o. I think there is reasonable to try to wait until tomorrow for the right inguinal hernia repair.  Patient understood and agreed with plan.  Non-recurrent unilateral inguinal hernia without obstruction or gangrene [K40.90]  PLAN: 1.  Robotic assisted laparoscopic right inguinal hernia repair with mesh [id number]) 2.  Avoid taking aspirin 5 days before procedure 3.  Contact us if has any question or concern.  Patient verbalized understanding, all questions were answered, and were agreeable with the plan outlined above.    Herbert Pun, MD  Electronically signed by Herbert Pun, MD
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
## 2020-03-23 ENCOUNTER — Ambulatory Visit
Admission: RE | Admit: 2020-03-23 | Discharge: 2020-03-23 | Disposition: A | Discharge status: Home / Self Care | Payer: Self-pay | Attending: General Surgery | Admitting: General Surgery

## 2020-03-23 LAB — SARS CORONAVIRUS 2 (TAT 6-24 HRS): SARS Coronavirus 2: NEGATIVE

## 2020-03-23 NOTE — Interval H&P Note (Signed)
History and Physical Interval Note:  03/23/2020 10:56 AM  Kyle Larson  has presented today for surgery, with the diagnosis of K40.90 non recurrent unilateral inguinal hernia w/o obstruction or gangrene.  Patient ate a sandwich at 9 o'clock this morning. Will reschedule surgery for Monday.    Herbert Pun

## 2020-03-26 ENCOUNTER — Ambulatory Visit: Payer: Self-pay | Admitting: Anesthesiology

## 2020-03-26 ENCOUNTER — Ambulatory Visit
Admission: RE | Admit: 2020-03-26 | Discharge: 2020-03-26 | Disposition: A | Discharge status: Home / Self Care | Payer: Self-pay | Source: Ambulatory Visit | Attending: General Surgery | Admitting: General Surgery

## 2020-03-26 ENCOUNTER — Encounter
Admission: RE | Disposition: A | Discharge status: Home / Self Care | Payer: Self-pay | Source: Ambulatory Visit | Attending: General Surgery

## 2020-03-26 ENCOUNTER — Ambulatory Visit: Admit: 2020-03-26 | Payer: Self-pay | Admitting: General Surgery

## 2020-03-26 ENCOUNTER — Encounter: Payer: Self-pay | Admitting: General Surgery

## 2020-03-26 ENCOUNTER — Other Ambulatory Visit: Payer: Self-pay

## 2020-03-26 DIAGNOSIS — D176 Benign lipomatous neoplasm of spermatic cord: Secondary | ICD-10-CM | POA: Insufficient documentation

## 2020-03-26 DIAGNOSIS — F17210 Nicotine dependence, cigarettes, uncomplicated: Secondary | ICD-10-CM | POA: Insufficient documentation

## 2020-03-26 DIAGNOSIS — K4090 Unilateral inguinal hernia, without obstruction or gangrene, not specified as recurrent: Secondary | ICD-10-CM | POA: Insufficient documentation

## 2020-03-26 HISTORY — PX: XI ROBOTIC ASSISTED INGUINAL HERNIA REPAIR WITH MESH: SHX6706

## 2020-03-26 SURGERY — REPAIR, HERNIA, INGUINAL, ROBOT-ASSISTED, LAPAROSCOPIC, USING MESH
Anesthesia: General | Laterality: Right

## 2020-03-26 SURGERY — REPAIR, HERNIA, INGUINAL, ROBOT-ASSISTED, LAPAROSCOPIC, USING MESH
Anesthesia: General | Site: Groin | Laterality: Right

## 2020-03-26 MED ORDER — ROCURONIUM BROMIDE 100 MG/10ML IV SOLN
INTRAVENOUS | Status: DC | PRN
  Administered 2020-03-26: 50 mg via INTRAVENOUS
  Administered 2020-03-26: 10 mg via INTRAVENOUS
  Administered 2020-03-26: 20 mg via INTRAVENOUS
  Administered 2020-03-26: 10 mg via INTRAVENOUS

## 2020-03-26 MED ORDER — LACTATED RINGERS IV SOLN: INTRAVENOUS | Status: DC

## 2020-03-26 MED ORDER — FAMOTIDINE 20 MG PO TABS: 20.0000 mg | ORAL_TABLET | Freq: Once | ORAL | Status: DC

## 2020-03-26 MED ORDER — BUPIVACAINE-EPINEPHRINE 0.25% -1:200000 IJ SOLN
INTRAMUSCULAR | Status: DC | PRN
  Administered 2020-03-26: 14 mL

## 2020-03-26 MED ORDER — ONDANSETRON HCL 4 MG/2ML IJ SOLN
INTRAMUSCULAR | Status: DC | PRN
  Administered 2020-03-26: 4 mg via INTRAVENOUS

## 2020-03-26 MED ORDER — FENTANYL CITRATE (PF) 100 MCG/2ML IJ SOLN
INTRAMUSCULAR | Status: AC
  Administered 2020-03-26: 25 ug via INTRAVENOUS
  Filled 2020-03-26: qty 2

## 2020-03-26 MED ORDER — PROPOFOL 10 MG/ML IV BOLUS
INTRAVENOUS | Status: DC | PRN
  Administered 2020-03-26: 150 mg via INTRAVENOUS

## 2020-03-26 MED ORDER — HYDROCODONE-ACETAMINOPHEN 5-325 MG PO TABS: 1.0000 | ORAL_TABLET | ORAL | 0 refills | Status: AC | PRN

## 2020-03-26 MED ORDER — FENTANYL CITRATE (PF) 100 MCG/2ML IJ SOLN
INTRAMUSCULAR | Status: DC | PRN
  Administered 2020-03-26 (×3): 50 ug via INTRAVENOUS

## 2020-03-26 MED ORDER — SEVOFLURANE IN SOLN
RESPIRATORY_TRACT | Status: AC
  Filled 2020-03-26: qty 250

## 2020-03-26 MED ORDER — DEXAMETHASONE SODIUM PHOSPHATE 10 MG/ML IJ SOLN
INTRAMUSCULAR | Status: DC | PRN
  Administered 2020-03-26: 10 mg via INTRAVENOUS

## 2020-03-26 MED ORDER — ACETAMINOPHEN 10 MG/ML IV SOLN
INTRAVENOUS | Status: DC | PRN
  Administered 2020-03-26: 1000 mg via INTRAVENOUS

## 2020-03-26 MED ORDER — MIDAZOLAM HCL 2 MG/2ML IJ SOLN
INTRAMUSCULAR | Status: DC | PRN
  Administered 2020-03-26: 2 mg via INTRAVENOUS

## 2020-03-26 MED ORDER — CEFAZOLIN SODIUM-DEXTROSE 2-4 GM/100ML-% IV SOLN
2.0000 g | INTRAVENOUS | Status: AC
  Administered 2020-03-26: 14:00:00 2 g via INTRAVENOUS

## 2020-03-26 MED ORDER — LIDOCAINE HCL (CARDIAC) PF 100 MG/5ML IV SOSY
PREFILLED_SYRINGE | INTRAVENOUS | Status: DC | PRN
  Administered 2020-03-26: 100 mg via INTRAVENOUS

## 2020-03-26 MED ORDER — PROPOFOL 10 MG/ML IV BOLUS
INTRAVENOUS | Status: AC
  Filled 2020-03-26: qty 20

## 2020-03-26 MED ORDER — SUGAMMADEX SODIUM 200 MG/2ML IV SOLN
INTRAVENOUS | Status: DC | PRN
  Administered 2020-03-26: 200 mg via INTRAVENOUS

## 2020-03-26 MED ORDER — DEXMEDETOMIDINE HCL 200 MCG/2ML IV SOLN
INTRAVENOUS | Status: DC | PRN
  Administered 2020-03-26 (×2): 8 ug via INTRAVENOUS

## 2020-03-26 MED ORDER — PHENYLEPHRINE HCL (PRESSORS) 10 MG/ML IV SOLN
INTRAVENOUS | Status: DC | PRN
  Administered 2020-03-26 (×2): 100 ug via INTRAVENOUS
  Administered 2020-03-26: 200 ug via INTRAVENOUS
  Administered 2020-03-26 (×3): 100 ug via INTRAVENOUS

## 2020-03-26 MED ORDER — FENTANYL CITRATE (PF) 100 MCG/2ML IJ SOLN
25.0000 ug | INTRAMUSCULAR | Status: DC | PRN
  Administered 2020-03-26 (×4): 25 ug via INTRAVENOUS

## 2020-03-26 MED ORDER — DEXMEDETOMIDINE HCL IN NACL 80 MCG/20ML IV SOLN
INTRAVENOUS | Status: AC
  Filled 2020-03-26: qty 20

## 2020-03-26 MED ORDER — DEXAMETHASONE SODIUM PHOSPHATE 10 MG/ML IJ SOLN
INTRAMUSCULAR | Status: AC
  Filled 2020-03-26: qty 1

## 2020-03-26 MED ORDER — ROCURONIUM BROMIDE 10 MG/ML (PF) SYRINGE
PREFILLED_SYRINGE | INTRAVENOUS | Status: AC
  Filled 2020-03-26: qty 10

## 2020-03-26 MED ORDER — PHENYLEPHRINE HCL (PRESSORS) 10 MG/ML IV SOLN
INTRAVENOUS | Status: AC
  Filled 2020-03-26: qty 1

## 2020-03-26 MED ORDER — FENTANYL CITRATE (PF) 100 MCG/2ML IJ SOLN
INTRAMUSCULAR | Status: AC
  Filled 2020-03-26: qty 2

## 2020-03-26 MED ORDER — ONDANSETRON HCL 4 MG/2ML IJ SOLN
INTRAMUSCULAR | Status: AC
  Filled 2020-03-26: qty 2

## 2020-03-26 MED ORDER — ACETAMINOPHEN 10 MG/ML IV SOLN
INTRAVENOUS | Status: AC
  Filled 2020-03-26: qty 100

## 2020-03-26 MED ORDER — ONDANSETRON HCL 4 MG/2ML IJ SOLN: 4.0000 mg | Freq: Once | INTRAMUSCULAR | Status: DC | PRN

## 2020-03-26 MED ORDER — MIDAZOLAM HCL 2 MG/2ML IJ SOLN
INTRAMUSCULAR | Status: AC
  Filled 2020-03-26: qty 2

## 2020-03-26 SURGICAL SUPPLY — 54 items
APPLICATOR CHLORAPREP 10 TEAL (MISCELLANEOUS) IMPLANT
BAG INFUSER PRESSURE 100CC (MISCELLANEOUS) IMPLANT
BLADE SURG SZ11 CARB STEEL (BLADE) ×3 IMPLANT
CANISTER SUCT 1200ML W/VALVE (MISCELLANEOUS) ×3 IMPLANT
CHLORAPREP W/TINT 26 (MISCELLANEOUS) ×3 IMPLANT
COVER TIP SHEARS 8 DVNC (MISCELLANEOUS) ×1 IMPLANT
COVER TIP SHEARS 8MM DA VINCI (MISCELLANEOUS) ×3
COVER WAND RF STERILE (DRAPES) ×6 IMPLANT
DEFOGGER SCOPE WARMER CLEARIFY (MISCELLANEOUS) ×3 IMPLANT
DERMABOND ADVANCED (GAUZE/BANDAGES/DRESSINGS) ×2
DERMABOND ADVANCED .7 DNX12 (GAUZE/BANDAGES/DRESSINGS) ×1 IMPLANT
DRAPE 3/4 80X56 (DRAPES) ×1 IMPLANT
DRAPE ARM DVNC X/XI (DISPOSABLE) ×3 IMPLANT
DRAPE COLUMN DVNC XI (DISPOSABLE) ×1 IMPLANT
DRAPE DA VINCI XI ARM (DISPOSABLE) ×9
DRAPE DA VINCI XI COLUMN (DISPOSABLE) ×3
ELECT REM PT RETURN 9FT ADLT (ELECTROSURGICAL) ×3
ELECTRODE REM PT RTRN 9FT ADLT (ELECTROSURGICAL) ×1 IMPLANT
GLOVE BIO SURGEON STRL SZ 6.5 (GLOVE) ×4 IMPLANT
GLOVE BIO SURGEONS STRL SZ 6.5 (GLOVE) ×2
GLOVE BIOGEL PI IND STRL 6.5 (GLOVE) ×2 IMPLANT
GLOVE BIOGEL PI INDICATOR 6.5 (GLOVE) ×4
GOWN STRL REUS W/ TWL LRG LVL3 (GOWN DISPOSABLE) ×3 IMPLANT
GOWN STRL REUS W/TWL LRG LVL3 (GOWN DISPOSABLE) ×9
IRRIGATOR SUCT 8 DISP DVNC XI (IRRIGATION / IRRIGATOR) IMPLANT
IRRIGATOR SUCTION 8MM XI DISP (IRRIGATION / IRRIGATOR)
IV CATH ANGIO 12GX3 LT BLUE (NEEDLE) IMPLANT
IV NS 1000ML (IV SOLUTION)
IV NS 1000ML BAXH (IV SOLUTION) IMPLANT
KIT PINK PAD W/HEAD ARE REST (MISCELLANEOUS) ×3
KIT PINK PAD W/HEAD ARM REST (MISCELLANEOUS) ×1 IMPLANT
LABEL OR SOLS (LABEL) IMPLANT
MESH 3DMAX 4X6 RT LRG (Mesh General) ×2 IMPLANT
MESH 3DMAX MID 4X6 RT LRG (Mesh General) IMPLANT
NDL INSUFFLATION 14GA 120MM (NEEDLE) ×1 IMPLANT
NEEDLE HYPO 22GX1.5 SAFETY (NEEDLE) ×3 IMPLANT
NEEDLE INSUFFLATION 14GA 120MM (NEEDLE) ×3 IMPLANT
OBTURATOR OPTICAL STANDARD 8MM (TROCAR) ×3
OBTURATOR OPTICAL STND 8 DVNC (TROCAR) ×1
OBTURATOR OPTICALSTD 8 DVNC (TROCAR) ×1 IMPLANT
PACK LAP CHOLECYSTECTOMY (MISCELLANEOUS) ×3 IMPLANT
SEAL CANN UNIV 5-8 DVNC XI (MISCELLANEOUS) ×3 IMPLANT
SEAL XI 5MM-8MM UNIVERSAL (MISCELLANEOUS) ×9
SET TUBE SMOKE EVAC HIGH FLOW (TUBING) ×3 IMPLANT
SOLUTION ELECTROLUBE (MISCELLANEOUS) ×3 IMPLANT
SUT MNCRL 4-0 (SUTURE) ×3
SUT MNCRL 4-0 27XMFL (SUTURE) ×1
SUT MNCRL AB 4-0 PS2 18 (SUTURE) ×3 IMPLANT
SUT VIC AB 2-0 SH 27 (SUTURE) ×6
SUT VIC AB 2-0 SH 27XBRD (SUTURE) ×1 IMPLANT
SUT VLOC 90 S/L VL9 GS22 (SUTURE) ×3 IMPLANT
SUTURE MNCRL 4-0 27XMF (SUTURE) IMPLANT
TAPE TRANSPORE STRL 2 31045 (GAUZE/BANDAGES/DRESSINGS) ×2 IMPLANT
TRAY FOLEY MTR SLVR 16FR STAT (SET/KITS/TRAYS/PACK) ×1 IMPLANT

## 2020-03-26 NOTE — Anesthesia Procedure Notes (Signed)
Procedure Name: Intubation Date/Time: 03/26/2020 1:43 PM Performed by: Aline Brochure, CRNA Pre-anesthesia Checklist: Patient identified, Emergency Drugs available, Suction available and Patient being monitored Patient Re-evaluated:Patient Re-evaluated prior to induction Oxygen Delivery Method: Circle system utilized Preoxygenation: Pre-oxygenation with 100% oxygen Induction Type: IV induction Ventilation: Mask ventilation without difficulty and Oral airway inserted - appropriate to patient size Laryngoscope Size: McGraph and 4 Grade View: Grade I Tube type: Oral Tube size: 7.5 mm Number of attempts: 1 Airway Equipment and Method: Stylet and Video-laryngoscopy Placement Confirmation: ETT inserted through vocal cords under direct vision,  positive ETCO2 and breath sounds checked- equal and bilateral Secured at: 23 cm Tube secured with: Tape Dental Injury: Teeth and Oropharynx as per pre-operative assessment

## 2020-03-26 NOTE — Op Note (Signed)
Preoperative diagnosis: Right inguinal hernia.   Postoperative diagnosis: Right inguinal hernia.  Procedure: Robotic assisted Laparoscopic Transabdominal preperitoneal laparoscopic (TAPP) repair of right inguinal hernia.  Anesthesia: GETA  Surgeon: Dr. Windell Moment  Wound Classification: Clean  Indications:  Patient is a 64 y.o. male developed a symptomatic right inguinal hernia. Repair was indicated.  Findings: 1. Right Inguinal hernia identified 2. Vas deferens and cord structures identified and preserved 3. Bard 3D Max mesh used for repair 4. Adequate hemostasis.   Description of procedure: The patient was taken to the operating room and the correct side of surgery was verified. The patient was placed supine with arms tucked at the sides. After obtaining adequate anesthesia, the patient's abdomen was prepped and draped in standard sterile fashion. The patient was placed in the Trendelenburg position. A time-out was completed verifying correct patient, procedure, site, positioning, and implant(s) and/or special equipment prior to beginning this procedure. A Veress needle was placed at the umbilicus and pneumoperitoneum created with insufflation of carbon dioxide to 15 mmHg. After the Veress needle was removed, an 8-mm trocar was placed on epigastric area and the 30 angled laparoscope inserted. Two 8-mm trocars were then placed lateral to the rectus sheath under direct visualization. Both inguinal regions were inspected and the median umbilical ligament, medial umbilical ligament, and lateral umbilical fold were identified.  The robotic arms were docked. The robotic scope was inserted and the pelvic area anatomy targeted.  The peritoneum was incised with scissors along a line 5 cm above the superior edge of the hernia defect, extending from the median umbilical ligament to the anterior superior iliac spine. The peritoneal flap was mobilized inferiorly using blunt and sharp dissection. The  inferior epigastric vessels were exposed and the pubic symphysis was identified. Cooper's ligament was dissected to its junction with the iliac vein. The dissection was continued inferiorly to the iliopubic tract, with care taken to avoid injury to the femoral branch of the genitofemoral nerve and the lateral femoral cutaneous nerve. The cord structures were parietalized. The hernia was identified and reduced by gentle traction.  The right hernia sac and cord lipoma were noted mobilized from the cord structures and reduced into the peritoneal cavity.  A large piece of mesh was rolled longitudinally into a compact cylinder and passed through a trocar. The cylinder was placed along the inferior aspect of the working space and unrolled into place to completely cover the direct, indirect, and femoral spaces. The mesh was secured into place superiorly to the anterior abdominal wall and inferiorly and medially to Cooper's ligament with absorbable sutures. Care was taken to avoid the inferolateral triangles containing the iliac vessels and genital nerves. The peritoneal flap was closed over the mesh and secured with suture in similar positions of safety. After ensuring adequate hemostasis, the trocars were removed and the pneumoperitoneum allowed to escape. The trocar incisions were closed using monocryl and skin adhesive dressings applied.  The patient tolerated the procedure well and was taken to the postanesthesia care unit in stable condition.   Specimen: None  Complications: None  Estimated Blood Loss: 5 mL

## 2020-03-26 NOTE — Anesthesia Preprocedure Evaluation (Signed)
Anesthesia Evaluation  Patient identified by MRN, date of birth, ID band Patient awake    Reviewed: Allergy & Precautions, H&P , NPO status , Patient's Chart, lab work & pertinent test results, reviewed documented beta blocker date and time   Airway Mallampati: II  TM Distance: >3 FB Neck ROM: full    Dental  (+) Teeth Intact   Pulmonary neg pulmonary ROS, Current Smoker,    Pulmonary exam normal        Cardiovascular Exercise Tolerance: Good negative cardio ROS Normal cardiovascular exam Rhythm:regular Rate:Normal     Neuro/Psych negative neurological ROS  negative psych ROS   GI/Hepatic negative GI ROS, Neg liver ROS,   Endo/Other  negative endocrine ROS  Renal/GU negative Renal ROS  negative genitourinary   Musculoskeletal   Abdominal   Peds  Hematology negative hematology ROS (+)   Anesthesia Other Findings History reviewed. No pertinent past medical history. Past Surgical History: 09/11/2016: ACROMIO-CLAVICULAR JOINT REPAIR; Left     Comment:  Procedure: ACROMIO-CLAVICULAR RECONSTRUCTION;  Surgeon:               Corky Mull, MD;  Location: [facility] ORS;  Service:               Orthopedics;  Laterality: Left;  clavicle No date: MANDIBLE RECONSTRUCTION No date: TONSILLECTOMY     Comment:  AGE 63   Reproductive/Obstetrics negative OB ROS                             Anesthesia Physical Anesthesia Plan  ASA: II  Anesthesia Plan: General ETT   Post-op Pain Management:    Induction:   PONV Risk Score and Plan:   Airway Management Planned:   Additional Equipment:   Intra-op Plan:   Post-operative Plan:   Informed Consent: I have reviewed the patients History and Physical, chart, labs and discussed the procedure including the risks, benefits and alternatives for the proposed anesthesia with the patient or authorized representative who has indicated his/her understanding and  acceptance.     Dental Advisory Given  Plan Discussed with: CRNA  Anesthesia Plan Comments:         Anesthesia Quick Evaluation

## 2020-03-26 NOTE — Transfer of Care (Signed)
Immediate Anesthesia Transfer of Care Note  Patient: Kyle Larson  Procedure(s) Performed: XI ROBOTIC ASSISTED INGUINAL HERNIA REPAIR WITH MESH (Right )  Patient Location: PACU  Anesthesia Type:General  Level of Consciousness: drowsy  Airway & Oxygen Therapy: Patient Spontanous Breathing and Patient connected to face mask oxygen  Post-op Assessment: Report given to RN and Post -op Vital signs reviewed and stable  Post vital signs: Reviewed and stable  Last Vitals:  Vitals Value Taken Time  BP 133/96 03/26/20 1523  Temp 36.3 C 03/26/20 1523  Pulse 70 03/26/20 1526  Resp 15 03/26/20 1526  SpO2 100 % 03/26/20 1526  Vitals shown include unvalidated device data.  Last Pain:  Vitals:   03/26/20 1303  TempSrc: Oral  PainSc: 0-No pain         Complications: No apparent anesthesia complications

## 2020-03-26 NOTE — Interval H&P Note (Signed)
History and Physical Interval Note:  03/26/2020 1:24 PM  Kyle Larson  has presented today for surgery, with the diagnosis of K40.90 non recurrent unilateral inguinal hernia w/o obstruction or gangrene.  The various methods of treatment have been discussed with the patient and family. After consideration of risks, benefits and other options for treatment, the patient has consented to  Procedure(s): XI ROBOTIC [place] (Right) as a surgical intervention.  The patient's history has been reviewed, patient examined, no change in status, stable for surgery.  I have reviewed the patient's chart and labs.  Right groin marked in the pre procedure room. Questions were answered to the patient's satisfaction.     Herbert Pun

## 2020-03-26 NOTE — Discharge Instructions (Signed)
  Diet: Resume home heart healthy regular diet.   Activity: No heavy lifting >20 pounds (children, pets, laundry, garbage) or strenuous activity until follow-up, but light activity and walking are encouraged. Do not drive or drink alcohol if taking narcotic pain medications.  Wound care: May shower with soapy water and pat dry (do not rub incisions), but no baths or submerging incision underwater until follow-up. (no swimming)   Medications: Resume all home medications. For mild to moderate pain: acetaminophen (Tylenol) or ibuprofen (if no kidney disease). Combining Tylenol with alcohol can substantially increase your risk of causing liver disease. Narcotic pain medications, if prescribed, can be used for severe pain, though may cause nausea, constipation, and drowsiness. Do not combine Tylenol and Norco within a 6 hour period as Norco contains Tylenol. If you do not need the narcotic pain medication, you do not need to fill the prescription.  Call office ([phone]) at any time if any questions, worsening pain, fevers/chills, bleeding, drainage from incision site, or other concerns.   AMBULATORY SURGERY  DISCHARGE INSTRUCTIONS   1) The drugs that you were given will stay in your system until tomorrow so for the next 24 hours you should not:  A) Drive an automobile B) Make any legal decisions C) Drink any alcoholic beverage   2) You may resume regular meals tomorrow.  Today it is better to start with liquids and gradually work up to solid foods.  You may eat anything you prefer, but it is better to start with liquids, then soup and crackers, and gradually work up to solid foods.   3) Please notify your doctor immediately if you have any unusual bleeding, trouble breathing, redness and pain at the surgery site, drainage, fever, or pain not relieved by medication.    4) Additional Instructions:        Please contact your physician with any problems or Same Day Surgery at  [phone], Monday through Friday 6 am to 4 pm, or [place] [facility] number at [phone]. 

## 2020-03-26 NOTE — Anesthesia Postprocedure Evaluation (Signed)
Anesthesia Post Note  Patient: Kyle Larson  Procedure(s) Performed: XI ROBOTIC ASSISTED INGUINAL HERNIA REPAIR WITH MESH (Right )  Patient location during evaluation: PACU Anesthesia Type: General Level of consciousness: awake and alert Pain management: pain level controlled Vital Signs Assessment: post-procedure vital signs reviewed and stable Respiratory status: spontaneous breathing, nonlabored ventilation, respiratory function stable and patient connected to nasal cannula oxygen Cardiovascular status: blood pressure returned to baseline and stable Postop Assessment: no apparent nausea or vomiting Anesthetic complications: no     Last Vitals:  Vitals:   03/26/20 1608 03/26/20 1620  BP: 135/80 (!) 156/85  Pulse: 82 81  Resp: 11 12  Temp: (!) 36.4 C 36.5 C  SpO2: 96% 97%    Last Pain:  Vitals:   03/26/20 1620  TempSrc: Temporal  PainSc: 4                  Precious Haws Waynetta Metheny

## 2021-01-18 ENCOUNTER — Other Ambulatory Visit: Payer: Self-pay

## 2021-01-18 ENCOUNTER — Emergency Department
Admission: EM | Admit: 2021-01-18 | Discharge: 2021-01-18 | Disposition: A | Discharge status: Home / Self Care | Payer: Self-pay | Attending: Emergency Medicine | Admitting: Emergency Medicine

## 2021-01-18 ENCOUNTER — Encounter: Payer: Self-pay | Admitting: Emergency Medicine

## 2021-01-18 DIAGNOSIS — Z20822 Contact with and (suspected) exposure to covid-19: Secondary | ICD-10-CM | POA: Insufficient documentation

## 2021-01-18 DIAGNOSIS — R42 Dizziness and giddiness: Secondary | ICD-10-CM | POA: Insufficient documentation

## 2021-01-18 DIAGNOSIS — F17210 Nicotine dependence, cigarettes, uncomplicated: Secondary | ICD-10-CM | POA: Insufficient documentation

## 2021-01-18 DIAGNOSIS — R55 Syncope and collapse: Secondary | ICD-10-CM | POA: Insufficient documentation

## 2021-01-18 LAB — RESP PANEL BY RT-PCR (FLU A&B, COVID) ARPGX2
Influenza A by PCR: NEGATIVE
Influenza B by PCR: NEGATIVE
SARS Coronavirus 2 by RT PCR: NEGATIVE

## 2021-01-18 LAB — BASIC METABOLIC PANEL
Anion gap: 7 (ref 5–15)
BUN: 8 mg/dL (ref 8–23)
CO2: 27 mmol/L (ref 22–32)
Calcium: 9.3 mg/dL (ref 8.9–10.3)
Chloride: 100 mmol/L (ref 98–111)
Creatinine, Ser: 0.65 mg/dL (ref 0.61–1.24)
GFR, Estimated: >60 mL/min (ref >60)
Glucose, Bld: 110 mg/dL — ABNORMAL HIGH (ref 70–99)
Potassium: 5 mmol/L (ref 3.5–5.1)
Sodium: 134 mmol/L — ABNORMAL LOW (ref 135–145)

## 2021-01-18 LAB — URINALYSIS, COMPLETE (UACMP) WITH MICROSCOPIC
Bacteria, UA: NONE SEEN
Bilirubin Urine: NEGATIVE
Glucose, UA: NEGATIVE mg/dL
Hgb urine dipstick: NEGATIVE
Ketones, ur: NEGATIVE mg/dL
Leukocytes,Ua: NEGATIVE
Nitrite: NEGATIVE
Protein, ur: NEGATIVE mg/dL
Specific Gravity, Urine: 1.011 (ref 1.005–1.030)
Squamous Epithelial / HPF: NONE SEEN (ref 0–5)
pH: 7 (ref 5.0–8.0)

## 2021-01-18 LAB — TROPONIN I (HIGH SENSITIVITY): Troponin I (High Sensitivity): 5 ng/L (ref <18)

## 2021-01-18 LAB — CBC
HCT: 37.4 % — ABNORMAL LOW (ref 39.0–52.0)
Hemoglobin: 12.6 g/dL — ABNORMAL LOW (ref 13.0–17.0)
MCH: 28.4 pg (ref 26.0–34.0)
MCHC: 33.7 g/dL (ref 30.0–36.0)
MCV: 84.4 fL (ref 80.0–100.0)
Platelets: 371 10*3/uL (ref 150–400)
RBC: 4.43 MIL/uL (ref 4.22–5.81)
RDW: 15.4 % (ref 11.5–15.5)
WBC: 10.9 10*3/uL — ABNORMAL HIGH (ref 4.0–10.5)
nRBC: 0 % (ref 0.0–0.2)
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
## 2021-01-18 NOTE — Discharge Instructions (Addendum)
As discussed please call the numbers provided for both cardiology as well as GI medicine to arrange further evaluation.  Return to the emergency department for any further episodes of feeling like you are going to pass out, for any chest pain, or any other symptom personally concerning to yourself.

## 2021-01-18 NOTE — ED Provider Notes (Signed)
[facility] Emergency Department Provider Note  Time seen: 1:02 PM  I have reviewed the triage vital signs and the nursing notes.   HISTORY  Chief Complaint Near Syncope   HPI Kyle Larson is a 65 y.o. male with no significant past medical history presents to the emergency department with a near syncopal episode.  According to the patient on Saturday he was driving when he began feeling lightheaded and dizzy and had to pull over as he thought he might pass out.  States this lasted approximately 30 seconds to a minute and then his symptoms resolved and he was able to continue.  Patient denies any palpitations or chest pain.  Denies any recent fever cough or shortness of breath.  Denies any recent nausea vomiting or diarrhea.  Patient has not had COVID in the past.   Patient once again experienced dizziness this morning so he came to the emergency department for evaluation.  History reviewed. No pertinent past medical history.  There are no problems to display for this patient.   Past Surgical History:  Procedure Laterality Date  . ACROMIO-CLAVICULAR JOINT REPAIR Left 09/11/2016   Procedure: ACROMIO-CLAVICULAR RECONSTRUCTION;  Surgeon: Corky Mull, MD;  Location: [facility] ORS;  Service: Orthopedics;  Laterality: Left;  clavicle  . MANDIBLE RECONSTRUCTION    . TONSILLECTOMY     AGE 74  . XI ROBOTIC ASSISTED INGUINAL HERNIA REPAIR WITH MESH Right 03/26/2020   Procedure: XI ROBOTIC ASSISTED INGUINAL HERNIA REPAIR WITH MESH;  Surgeon: Herbert Pun, MD;  Location: [facility] ORS;  Service: General;  Laterality: Right;    Prior to Admission medications   Medication Sig Start Date End Date Taking? Authorizing Provider  ibuprofen (ADVIL) 200 MG tablet Take 400 mg by mouth every 6 (six) hours as needed.    [provider]  Multiple Vitamin (MULTIVITAMIN) tablet Take 1 tablet by mouth daily.    [provider]  oxyCODONE (ROXICODONE) 5 MG immediate  release tablet Take 1-2 tablets (5-10 mg total) by mouth every 4 (four) hours as needed for severe pain. Patient not taking: Reported on 03/22/2020 09/11/16   Poggi, Marshall Cork, MD    No Known Allergies  No family history on file.  Social History Social History   Tobacco Use  . Smoking status: Current Every Day Smoker    Packs/day: 1.00    Years: 15.00    Pack years: 15.00    Types: Cigarettes  . Smokeless tobacco: Never Used  Substance Use Topics  . Alcohol use: Yes    Comment: OCC  . Drug use: No    Review of Systems Constitutional: Negative for fever.  Positive for lightheadedness/dizziness several days ago now resolved. Cardiovascular: Negative for chest pain. Respiratory: Negative for shortness of breath. Gastrointestinal: Negative for abdominal pain, vomiting and diarrhea. Genitourinary: Negative for urinary compaints Musculoskeletal: Negative for musculoskeletal complaints Neurological: Negative for headache All other ROS negative  ____________________________________________   PHYSICAL EXAM:  VITAL SIGNS: ED Triage Vitals  Enc Vitals Group     BP 01/18/21 1205 (!) 141/96     Pulse Rate 01/18/21 1205 98     Resp 01/18/21 1205 16     Temp 01/18/21 1205 98.2 F (36.8 C)     Temp Source 01/18/21 1205 Oral     SpO2 01/18/21 1205 98 %     Weight 01/18/21 1206 150 lb (68 kg)     Height 01/18/21 1206 6' (1.829 m)     Head Circumference --  Peak Flow --      Pain Score 01/18/21 1206 0     Pain Loc --      Pain Edu? --      Excl. in [place]? --    Constitutional: Alert and oriented. Well appearing and in no distress. Eyes: Normal exam ENT      Head: Normocephalic and atraumatic.      Mouth/Throat: Mucous membranes are moist. Cardiovascular: Normal rate, regular rhythm.  Respiratory: Normal respiratory effort without tachypnea nor retractions. Breath sounds are clear  Gastrointestinal: Soft and nontender. No distention. Musculoskeletal: Nontender with normal  range of motion in all extremities.  Neurologic:  Normal speech and language. No gross focal neurologic deficits Skin:  Skin is warm, dry and intact.  Psychiatric: Mood and affect are normal.   ____________________________________________    EKG  EKG viewed and interpreted by myself shows a normal sinus rhythm 89 bpm with a narrow QRS, right axis deviation, largely normal intervals with no concerning ST changes.  ____________________________________________  INITIAL IMPRESSION / ASSESSMENT AND PLAN / ED COURSE  Pertinent labs & imaging results that were available during my care of the patient were reviewed by me and considered in my medical decision making (see chart for details).   Patient presents to the emergency department for near syncopal episode 6 days ago with dizziness this morning.  Overall the patient appears well, labs are reassuring, vitals are reassuring.  Given the patient's recurrent symptoms I did discuss follow-up with a cardiologist this week for a Holter monitor and evaluation.  Patient's lab work does show a slight decrease in hemoglobin although hemoglobin remains at 12.6.  Does state a history of hemorrhoids with occasional blood on the toilet paper we will refer to GI medicine as well.  Given the patient's symptoms we will also swab for Covid as a precaution.  I have also added on a troponin as a precaution.  Patient agreeable to plan of care.  Covid and troponin are pending.  Patient states he needs to leave to pick up his wife.  Wishes to leave before results are known.  Kyle Larson was evaluated in Emergency Department on 01/18/2021 for the symptoms described in the history of present illness. He was evaluated in the context of the global COVID-19 pandemic, which necessitated consideration that the patient might be at risk for infection with the SARS-CoV-2 virus that causes COVID-19. Institutional protocols and algorithms that pertain to the evaluation of patients  at risk for COVID-19 are in a state of rapid change based on information released by regulatory bodies including the CDC and federal and state organizations. These policies and algorithms were followed during the patient's care in the ED.  ____________________________________________   FINAL CLINICAL IMPRESSION(S) / ED DIAGNOSES  Near syncope   Harvest Dark, MD 01/18/21 1334

## 2021-03-15 ENCOUNTER — Ambulatory Visit: Payer: Self-pay | Admitting: Gastroenterology

## 2021-03-22 ENCOUNTER — Ambulatory Visit: Payer: Self-pay | Admitting: Gastroenterology

## 2021-05-19 ENCOUNTER — Emergency Department: Payer: Self-pay

## 2021-05-19 ENCOUNTER — Inpatient Hospital Stay
Admission: EM | Admit: 2021-05-19 | Discharge: 2021-05-20 | DRG: 641 | Disposition: A | Discharge status: Home / Self Care | Payer: Self-pay | Attending: Obstetrics and Gynecology | Admitting: Obstetrics and Gynecology

## 2021-05-19 ENCOUNTER — Encounter: Payer: Self-pay | Admitting: Emergency Medicine

## 2021-05-19 ENCOUNTER — Other Ambulatory Visit: Payer: Self-pay

## 2021-05-19 DIAGNOSIS — I700 Atherosclerosis of aorta: Secondary | ICD-10-CM | POA: Diagnosis present

## 2021-05-19 DIAGNOSIS — R7989 Other specified abnormal findings of blood chemistry: Secondary | ICD-10-CM | POA: Diagnosis present

## 2021-05-19 DIAGNOSIS — W1839XA Other fall on same level, initial encounter: Secondary | ICD-10-CM | POA: Diagnosis present

## 2021-05-19 DIAGNOSIS — I6529 Occlusion and stenosis of unspecified carotid artery: Secondary | ICD-10-CM | POA: Diagnosis present

## 2021-05-19 DIAGNOSIS — R55 Syncope and collapse: Secondary | ICD-10-CM | POA: Diagnosis present

## 2021-05-19 DIAGNOSIS — R079 Chest pain, unspecified: Secondary | ICD-10-CM | POA: Diagnosis present

## 2021-05-19 DIAGNOSIS — D72829 Elevated white blood cell count, unspecified: Secondary | ICD-10-CM | POA: Diagnosis present

## 2021-05-19 DIAGNOSIS — S0181XA Laceration without foreign body of other part of head, initial encounter: Secondary | ICD-10-CM | POA: Diagnosis present

## 2021-05-19 DIAGNOSIS — Z23 Encounter for immunization: Secondary | ICD-10-CM

## 2021-05-19 DIAGNOSIS — I493 Ventricular premature depolarization: Secondary | ICD-10-CM | POA: Diagnosis present

## 2021-05-19 DIAGNOSIS — Y9389 Activity, other specified: Secondary | ICD-10-CM

## 2021-05-19 DIAGNOSIS — Z20822 Contact with and (suspected) exposure to covid-19: Secondary | ICD-10-CM | POA: Diagnosis present

## 2021-05-19 DIAGNOSIS — Z720 Tobacco use: Secondary | ICD-10-CM | POA: Diagnosis present

## 2021-05-19 DIAGNOSIS — M25511 Pain in right shoulder: Secondary | ICD-10-CM | POA: Diagnosis present

## 2021-05-19 DIAGNOSIS — F1010 Alcohol abuse, uncomplicated: Secondary | ICD-10-CM

## 2021-05-19 DIAGNOSIS — E871 Hypo-osmolality and hyponatremia: Secondary | ICD-10-CM

## 2021-05-19 DIAGNOSIS — I25119 Atherosclerotic heart disease of native coronary artery with unspecified angina pectoris: Secondary | ICD-10-CM | POA: Diagnosis present

## 2021-05-19 DIAGNOSIS — F17210 Nicotine dependence, cigarettes, uncomplicated: Secondary | ICD-10-CM | POA: Diagnosis present

## 2021-05-19 DIAGNOSIS — G8929 Other chronic pain: Secondary | ICD-10-CM | POA: Diagnosis present

## 2021-05-19 DIAGNOSIS — J439 Emphysema, unspecified: Secondary | ICD-10-CM | POA: Diagnosis present

## 2021-05-19 DIAGNOSIS — E860 Dehydration: Principal | ICD-10-CM | POA: Diagnosis present

## 2021-05-19 DIAGNOSIS — Y92524 Gas station as the place of occurrence of the external cause: Secondary | ICD-10-CM

## 2021-05-19 DIAGNOSIS — R059 Cough, unspecified: Secondary | ICD-10-CM

## 2021-05-19 HISTORY — DX: Fracture of unspecified metatarsal bone(s), unspecified foot, initial encounter for closed fracture: S92.309A

## 2021-05-19 HISTORY — DX: Unspecified dislocation of unspecified acromioclavicular joint, initial encounter: S43.109A

## 2021-05-19 HISTORY — DX: Syncope and collapse: R55

## 2021-05-19 HISTORY — DX: Tobacco use: Z72.0

## 2021-05-19 LAB — URINALYSIS, COMPLETE (UACMP) WITH MICROSCOPIC
Bacteria, UA: NONE SEEN
Bilirubin Urine: NEGATIVE
Glucose, UA: NEGATIVE mg/dL
Hgb urine dipstick: NEGATIVE
Ketones, ur: 5 mg/dL — AB
Leukocytes,Ua: NEGATIVE
Nitrite: NEGATIVE
Protein, ur: NEGATIVE mg/dL
Specific Gravity, Urine: 1.017 (ref 1.005–1.030)
Squamous Epithelial / HPF: NONE SEEN (ref 0–5)
pH: 7 (ref 5.0–8.0)

## 2021-05-19 LAB — RESP PANEL BY RT-PCR (FLU A&B, COVID) ARPGX2
Influenza A by PCR: NEGATIVE
Influenza B by PCR: NEGATIVE
SARS Coronavirus 2 by RT PCR: NEGATIVE

## 2021-05-19 LAB — HEPATIC FUNCTION PANEL
ALT: 31 U/L (ref 0–44)
AST: 50 U/L — ABNORMAL HIGH (ref 15–41)
Albumin: 3.7 g/dL (ref 3.5–5.0)
Alkaline Phosphatase: 57 U/L (ref 38–126)
Bilirubin, Direct: <0.1 mg/dL (ref 0.0–0.2)
Total Bilirubin: 0.5 mg/dL (ref 0.3–1.2)
Total Protein: 7.2 g/dL (ref 6.5–8.1)

## 2021-05-19 LAB — TROPONIN I (HIGH SENSITIVITY)
Troponin I (High Sensitivity): 5 ng/L (ref <18)
Troponin I (High Sensitivity): 6 ng/L (ref <18)

## 2021-05-19 LAB — TSH: TSH: 1.963 u[IU]/mL (ref 0.350–4.500)

## 2021-05-19 LAB — BASIC METABOLIC PANEL
Anion gap: 10 (ref 5–15)
BUN: 6 mg/dL — ABNORMAL LOW (ref 8–23)
CO2: 23 mmol/L (ref 22–32)
Calcium: 9.1 mg/dL (ref 8.9–10.3)
Chloride: 93 mmol/L — ABNORMAL LOW (ref 98–111)
Creatinine, Ser: 0.53 mg/dL — ABNORMAL LOW (ref 0.61–1.24)
GFR, Estimated: >60 mL/min (ref >60)
Glucose, Bld: 97 mg/dL (ref 70–99)
Potassium: 4.4 mmol/L (ref 3.5–5.1)
Sodium: 126 mmol/L — ABNORMAL LOW (ref 135–145)

## 2021-05-19 LAB — CBC
HCT: 35.5 % — ABNORMAL LOW (ref 39.0–52.0)
Hemoglobin: 12.5 g/dL — ABNORMAL LOW (ref 13.0–17.0)
MCH: 28.2 pg (ref 26.0–34.0)
MCHC: 35.2 g/dL (ref 30.0–36.0)
MCV: 80 fL (ref 80.0–100.0)
Platelets: 412 10*3/uL — ABNORMAL HIGH (ref 150–400)
RBC: 4.44 MIL/uL (ref 4.22–5.81)
RDW: 16.9 % — ABNORMAL HIGH (ref 11.5–15.5)
WBC: 13.8 10*3/uL — ABNORMAL HIGH (ref 4.0–10.5)
nRBC: 0 % (ref 0.0–0.2)

## 2021-05-19 LAB — MAGNESIUM
Magnesium: 1.7 mg/dL (ref 1.7–2.4)
Magnesium: 1.7 mg/dL (ref 1.7–2.4)

## 2021-05-19 LAB — D-DIMER, QUANTITATIVE: D-Dimer, Quant: 3.85 ug/mL-FEU — ABNORMAL HIGH (ref 0.00–0.50)

## 2021-05-19 MED ORDER — LORAZEPAM 0.5 MG PO TABS
0.5000 mg | ORAL_TABLET | Freq: Four times a day (QID) | ORAL | Status: DC | PRN
  Administered 2021-05-19: 0.5 mg via ORAL
  Filled 2021-05-19: qty 1

## 2021-05-19 MED ORDER — NITROGLYCERIN 0.4 MG SL SUBL
0.4000 mg | SUBLINGUAL_TABLET | SUBLINGUAL | Status: DC | PRN
  Filled 2021-05-19: qty 1

## 2021-05-19 MED ORDER — NICOTINE 21 MG/24HR TD PT24
21.0000 mg | MEDICATED_PATCH | Freq: Every day | TRANSDERMAL | Status: DC
  Administered 2021-05-19 – 2021-05-20 (×2): 21 mg via TRANSDERMAL
  Filled 2021-05-19 (×2): qty 1

## 2021-05-19 MED ORDER — LACTATED RINGERS IV BOLUS
1000.0000 mL | Freq: Once | INTRAVENOUS | Status: AC
  Administered 2021-05-19: 1000 mL via INTRAVENOUS

## 2021-05-19 MED ORDER — TETANUS-DIPHTH-ACELL PERTUSSIS 5-2.5-18.5 LF-MCG/0.5 IM SUSY
0.5000 mL | PREFILLED_SYRINGE | Freq: Once | INTRAMUSCULAR | Status: AC
  Administered 2021-05-19: 0.5 mL via INTRAMUSCULAR
  Filled 2021-05-19: qty 0.5

## 2021-05-19 MED ORDER — ACETAMINOPHEN 325 MG PO TABS
650.0000 mg | ORAL_TABLET | Freq: Four times a day (QID) | ORAL | Status: DC | PRN
  Administered 2021-05-20: 08:00:00 650 mg via ORAL
  Filled 2021-05-19: qty 2

## 2021-05-19 MED ORDER — ASPIRIN EC 325 MG PO TBEC
325.0000 mg | DELAYED_RELEASE_TABLET | Freq: Once | ORAL | Status: AC
  Administered 2021-05-19: 325 mg via ORAL

## 2021-05-19 MED ORDER — ACETAMINOPHEN 650 MG RE SUPP: 650.0000 mg | Freq: Four times a day (QID) | RECTAL | Status: DC | PRN

## 2021-05-19 MED ORDER — IOHEXOL 350 MG/ML SOLN
75.0000 mL | Freq: Once | INTRAVENOUS | Status: AC | PRN
  Administered 2021-05-19: 75 mL via INTRAVENOUS

## 2021-05-19 NOTE — ED Triage Notes (Signed)
Pt reports has been taking blood thinners that his friend shared with him because he has been getting dizzy. Unsure of what the name is

## 2021-05-19 NOTE — ED Notes (Signed)
Patient transported to CT 

## 2021-05-19 NOTE — ED Notes (Signed)
Pt got oput of bed did not call, pts wife in room , call light was in reach , pt fell to ground did not hit his head. ED MD made aware

## 2021-05-19 NOTE — ED Triage Notes (Signed)
Pt reports was pumping gas and then woke up on the ground with severe pressure in his chest. Pt also reports pain to his left shoulder from the fall. Pt with laceration above left eye and slight swelling.

## 2021-05-19 NOTE — H&P (Signed)
History and Physical    PLEASE NOTE THAT [employer]AGON DICT[employer] SOFTWARE WAS USED IN THE CONSTRUCTION OF THIS NOTE.   Kyle Larson OYD:[id number] DOB: 11-01-1956 DOA: 05/19/2021  PCP: [name], MD Patient coming from: home   I have personally briefly reviewed patient's old medical records in [place]  Chief Complaint: Syncope  HPI: Kyle Larson is a 65 y.o. male with medical history significant for tobacco abuse, who is admitted to [facility] on 05/19/2021 with syncope after presenting from home to [facility] ED complaining of episode of loss of consciousness.   The patient reports that he was pumping gas at a local gas station earlier this afternoon when, without warning, he lost consciousness, falling to the concrete below and hitting his head as a component of this.  He emphasizes that he had already lost consciousness by the time he hit the concrete, as he has no recollection of preceding dizziness or the act of falling to the ground.  This was witnessed by a good Samaritan at the gas station.  Additionally, his wife was present with him at the gas station, although within the associated convenience store at the time of the patient's episode of loss of consciousness.  She was in the store for only 2 to 3 minutes before returning to their car and finding the patient being held back to his feet.  In the minutes leading up to this episode of loss of consciousness, The patient denies any chest pain, palpitations, diaphoresis, nausea, vomiting, dizziness, presyncope.  Leading up to this episode, he also denies any recent trauma or travel.  Episode was not associated with any tongue biting or loss of bowel/bladder function, nor was it associated with any generalized tonic-clonic activity.  The patient denies any recent new onset edema in the lower extremities, new onset erythema in the lower extremities, or recent calf tenderness.  Denies any associated acute focal  neurologic deficits, including no associated acute focal weakness, acute focal numbness, paresthesias, slurred speech, facial droop, acute change in vision, dysphagia, or vertigo.  Over the course of the last 2 weeks leading up to this syncopal episode, the patient reports that he has been experiencing new onset chest pain over that time.  He notes experiencing intermittent substernal chest pressure without radiation that worsens with exertion and improves with rest.  These episodes are associated with mild shortness of breath in the absence of any palpitations, diaphoresis, dizziness, presyncope or nausea/vomiting.  He has not taken any sublingual nitroglycerin while experiencing these episodes.  Episodes typically last for less than 5 minutes before spontaneously resolving.  They are nonpositional, nonpleuritic, and not reproducible with replication of the anterior chest wall.  He does not believe that they are occurring with increasing frequency over the last 2 weeks, or increasing intensity over that time.  Most recent episode occurred on the morning of 05/19/2021 several hours before his presenting episode of syncope.  Denies any current chest pain, nor any chest pain since he has presented to the ED today.  Denies any recent hemoptysis, orthopnea, PND.  Patient reports that he was recommended to pursue a stress test as an outpatient in February 2022, but conveys that he did not subsequently pursue this recommendation due to associated financial concerns.  Denies any previous coronary ischemic evaluation, including no prior stress test or coronary angiography.  He acknowledges that he is a current smoker, but denies any known underlying history of hypertension, hyperlipidemia, or diabetes.  Denies any use  of recreational drugs.  Denies any subjective fever, chills, rigors, or generalized myalgias. Denies any recent headache, neck stiffness, rhinitis, rhinorrhea, sore throat, new wheezing, cough, nausea,  vomiting, abdominal pain, diarrhea, or rash. No recent traveling or known COVID-19 exposures. Denies dysuria, gross hematuria, or change in urinary urgency/frequency.      ED Course:  Vital signs in the ED were notable for the following: Temperature max 98.5, heart rate 87-96; blood pressure 131/85 -150/74; respiratory rate 17-22; oxygen saturation 97 to 100% on room air.  Labs were notable for the following: CMP was notable for the following: Sodium 126, potassium 4.4, chloride 93, bicarbonate 23, anion gap 10, creatinine 0.53, glucose 97, and liver enzymes were found to be within normal limits.  Magnesium level 1.7.  High-sensitivity troponin high initially found to be 5, with repeat value found to be 6.  CBC notable for white blood cell count of 13,800, hemoglobin 12.5.  D-dimer 3.85.  Urinalysis showed no white blood cells, no bacteria, leukocyte Estrace negative, and nitrate negative along with specific gravity of 1.017.  Nasopharyngeal COVID-19/influenza PCR checked in the ED today and found to be negative.  EKG shows sinus rhythm with single PVC, ventricular rate 94, nonspecific T wave inversion in leads III and aVF, which appear new relative to most recent prior EKG on January 21, 2021, with today's EKG showing no evidence of ST changes, including no evidence of ST elevation.  Noncontrast CT that showed no evidence of acute intracranial process, including no evidence of intracranial hemorrhage; CT maxillofacial showed no evidence of acute maxillofacial fracture.  CT cervical spine showed no evidence of acute cervical spine fracture or traumatic listhesis.  Chest x-ray shows no evidence of acute cardiopulmonary process.  CTA chest shows no evidence of acute pulmonary embolus, infiltrate, edema, effusion, or pneumothorax, while showing evidence of coronary artery disease.  While in the ED, the following were administered: Lactated ringer bolus x1 L.  Subsequently, the patient was admitted to the  med telemetry floor for overnight observation for further evaluation management of presenting syncope in the context of a 2 weeks of typical sounding chest pain.     Review of Systems: As per HPI otherwise 10 point review of systems negative.   History reviewed. No pertinent past medical history.  Past Surgical History:  Procedure Laterality Date   ACROMIO-CLAVICULAR JOINT REPAIR Left 09/11/2016   Procedure: ACROMIO-CLAVICULAR RECONSTRUCTION;  Surgeon: Corky Mull, MD;  Location: [facility] ORS;  Service: Orthopedics;  Laterality: Left;  clavicle   MANDIBLE RECONSTRUCTION     TONSILLECTOMY     AGE 59   XI ROBOTIC ASSISTED INGUINAL HERNIA REPAIR WITH MESH Right 03/26/2020   Procedure: XI ROBOTIC ASSISTED INGUINAL HERNIA REPAIR WITH MESH;  Surgeon: Herbert Pun, MD;  Location: [facility] ORS;  Service: General;  Laterality: Right;    Social History:  reports that he has been smoking cigarettes. He has a 15.00 pack-year smoking history. He has never used smokeless tobacco. He reports current alcohol use. He reports that he does not use drugs.   No Known Allergies  Family history reviewed and not pertinent    Prior to Admission medications   Medication Sig Start Date End Date Taking? Authorizing Provider  ibuprofen (ADVIL) 200 MG tablet Take 400 mg by mouth every 6 (six) hours as needed.    [provider]  Multiple Vitamin (MULTIVITAMIN) tablet Take 1 tablet by mouth daily.    [provider]  oxyCODONE (ROXICODONE) 5 MG immediate release tablet  Take 1-2 tablets (5-10 mg total) by mouth every 4 (four) hours as needed for severe pain. Patient not taking: Reported on 03/22/2020 09/11/16   Corky Mull, MD     Objective    Physical Exam: Vitals:   05/19/21 1618 05/19/21 1653 05/19/21 1658 05/19/21 1827  BP: (!) 160/70 (!) 150/74 (!) 150/73 (!) 154/77  Pulse: 93 93 92 96  Resp: 17 (!) 30 18 (!) 22  Temp:   98.4 F (36.9 C)   TempSrc:   Oral   SpO2: 98% 97% 98%  97%  Weight:      Height:        General: appears to be stated age; alert, oriented Skin: warm, dry, no rash Head:  AT/[place] Mouth:  Oral mucosa membranes appear moist, normal dentition Neck: supple; trachea midline Heart:  RRR; did not appreciate any M/R/G Lungs: CTAB, did not appreciate any wheezes, rales, or rhonchi Abdomen: + BS; soft, ND, NT Vascular: 2+ pedal pulses b/l; 2+ radial pulses b/l Extremities: no peripheral edema, no muscle wasting Neuro: strength and sensation intact in upper and lower extremities b/l    Labs on Admission: I have personally reviewed following labs and imaging studies  CBC: Recent Labs  Lab 05/19/21 1506  WBC 13.8*  HGB 12.5*  HCT 35.5*  MCV 80.0  PLT 956*   Basic Metabolic Panel: Recent Labs  Lab 05/19/21 1506 05/19/21 1521  NA 126*  --   K 4.4  --   CL 93*  --   CO2 23  --   GLUCOSE 97  --   BUN 6*  --   CREATININE 0.53*  --   CALCIUM 9.1  --   MG  --  1.7   GFR: Estimated Creatinine Clearance: 88.5 mL/min (A) (by C-G formula based on SCr of 0.53 mg/dL (L)). Liver Function Tests: Recent Labs  Lab 05/19/21 1521  AST 50*  ALT 31  ALKPHOS 57  BILITOT 0.5  PROT 7.2  ALBUMIN 3.7   No results for input(s): LIPASE, AMYLASE in the last 168 hours. No results for input(s): AMMONIA in the last 168 hours. Coagulation Profile: No results for input(s): INR, PROTIME in the last 168 hours. Cardiac Enzymes: No results for input(s): CKTOTAL, CKMB, CKMBINDEX, TROPONINI in the last 168 hours. BNP (last 3 results) No results for input(s): PROBNP in the last 8760 hours. HbA1C: No results for input(s): HGBA1C in the last 72 hours. CBG: No results for input(s): GLUCAP in the last 168 hours. Lipid Profile: No results for input(s): CHOL, HDL, LDLCALC, TRIG, CHOLHDL, LDLDIRECT in the last 72 hours. Thyroid Function Tests: Recent Labs    05/19/21 1611  TSH 1.963   Anemia Panel: No results for input(s): VITAMINB12, FOLATE, FERRITIN,  TIBC, IRON, RETICCTPCT in the last 72 hours. Urine analysis:    Component Value Date/Time   COLORURINE YELLOW (A) 01/18/2021 1220   APPEARANCEUR HAZY (A) 01/18/2021 1220   LABSPEC 1.011 01/18/2021 1220   PHURINE 7.0 01/18/2021 1220   GLUCOSEU NEGATIVE 01/18/2021 1220   HGBUR NEGATIVE 01/18/2021 1220   BILIRUBINUR NEGATIVE 01/18/2021 1220   KETONESUR NEGATIVE 01/18/2021 1220   PROTEINUR NEGATIVE 01/18/2021 1220   NITRITE NEGATIVE 01/18/2021 1220   LEUKOCYTESUR NEGATIVE 01/18/2021 1220    Radiological Exams on Admission: DG Chest 2 View  Result Date: 05/19/2021 CLINICAL DATA:  Chest pain EXAM: CHEST - 2 VIEW COMPARISON:  October 03, 2016. FINDINGS: Lungs are hyperinflated with flattening of RIGHT and LEFT hemidiaphragm. No sign of  consolidation, pleural effusion or pneumothorax. Cardiomediastinal contours and hilar structures are normal. Signs of prior skeletal trauma about the LEFT hemithorax, signs of ORIF the LEFT clavicle with lucency about the lateral aspect of the plate along the under surface of the acromion and lucency about the lateral screw of the clavicular plate. IMPRESSION: 1. No acute cardiopulmonary disease. 2. Hyperinflation, correlate with any signs of COPD. 3. Signs of prior skeletal trauma about the LEFT hemithorax with signs of hardware loosening. 4. Lucency about hardware in the LEFT distal clavicle. May reflect hardware loosening. Correlate with any signs of infection. Electronically Signed   By: Zetta Bills M.D.   On: 05/19/2021 16:11   DG Shoulder Right  Result Date: 05/19/2021 CLINICAL DATA:  Syncope. EXAM: RIGHT SHOULDER - 2+ VIEW COMPARISON:  None FINDINGS: Shoulder is located without sign of fracture. Soft tissues are unremarkable. High riding humeral head. IMPRESSION: 1. No sign of fracture or dislocation. 2. High riding humeral head suggesting rotator cuff injury. Electronically Signed   By: Zetta Bills M.D.   On: 05/19/2021 16:17   CT Head Wo  Contrast  Result Date: 05/19/2021 CLINICAL DATA:  Fall EXAM: CT HEAD WITHOUT CONTRAST CT CERVICAL SPINE WITHOUT CONTRAST TECHNIQUE: Multidetector CT imaging of the head and cervical spine was performed following the standard protocol without intravenous contrast. Multiplanar CT image reconstructions of the cervical spine were also generated. COMPARISON:  None. FINDINGS: CT HEAD FINDINGS Brain: No evidence of acute infarction, hemorrhage, hydrocephalus, extra-axial collection or mass lesion/mass effect. Periventricular white matter hypodensity. Vascular: No hyperdense vessel or unexpected calcification. Skull: Normal. Negative for fracture or focal lesion. Sinuses/Orbits: No acute finding. Other: Dense metallic foreign body in the right parietal scalp (series 3, image 46). CT CERVICAL SPINE FINDINGS Alignment: Degenerative straightening and reversal of the normal cervical lordosis. Skull base and vertebrae: No acute fracture. No primary bone lesion or focal pathologic process. Soft tissues and spinal canal: No prevertebral fluid or swelling. No visible canal hematoma. Disc levels: Severe disc degenerative disease and osteophytosis at C4 through C7. Upper chest: Negative. Other: None. IMPRESSION: 1. No acute intracranial pathology. Small-vessel white matter disease. 2. No fracture or static subluxation of the cervical spine. 3. Severe multilevel cervical disc degenerative disease. Electronically Signed   By: Eddie Candle M.D.   On: 05/19/2021 17:18   CT Head Wo Contrast  Result Date: 05/19/2021 CLINICAL DATA:  Syncope and fall. EXAM: CT HEAD WITHOUT CONTRAST CT MAXILLOFACIAL WITHOUT CONTRAST CT CERVICAL SPINE WITHOUT CONTRAST TECHNIQUE: Multidetector CT imaging of the head, cervical spine, and maxillofacial structures were performed using the standard protocol without intravenous contrast. Multiplanar CT image reconstructions of the cervical spine and maxillofacial structures were also generated. COMPARISON:   None. FINDINGS: CT HEAD FINDINGS Brain: No evidence of acute infarction, hemorrhage, hydrocephalus, extra-axial collection or mass lesion/mass effect. Mild generalized cerebral atrophy. Scattered mild periventricular and subcortical white matter hypodensities are nonspecific, but favored to reflect chronic microvascular ischemic changes. Vascular: No hyperdense vessel or unexpected calcification. Skull: Normal. Negative for fracture or focal lesion. Other: Small radiopaque density in the right parietal scalp. CT MAXILLOFACIAL FINDINGS Osseous: No fracture or mandibular dislocation.  Poor dentition. Orbits: Negative. No traumatic or inflammatory finding. 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 Frothy secretions in the right maxillary sinus. Partial opacification of the ethmoid air cells. The mastoid air cells are clear. Soft tissues: Mild left facial soft tissue swelling. CT CERVICAL SPINE FINDINGS Alignment: No traumatic malalignment. Reversal  of the normal cervical lordosis. Trace anterolisthesis at C2-C3, C3-C4, and C7-T1. Trace retrolisthesis at C6-C7. Skull base and vertebrae: No acute fracture. No primary bone lesion or focal pathologic process. Soft tissues and spinal canal: No prevertebral fluid or swelling. No visible canal hematoma. Disc levels: Moderate to severe disc height loss from C4-C5 through C6-C7 with right greater than left uncovertebral hypertrophy. Multilevel facet arthropathy. Upper chest: Mild emphysema. Other: None. IMPRESSION: CT head: 1. No acute intracranial abnormality. Mild cerebral atrophy and chronic microvascular ischemic changes. CT maxillofacial: 1. No acute maxillofacial fracture. Mild left facial soft tissue swelling. 2. 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 CT cervical spine: 1. No acute cervical spine fracture or traumatic listhesis. Moderate to severe degenerative changes of the  cervical spine. 2. Emphysema (ICD10-J43.9). Electronically Signed   By: Titus Dubin M.D.   On: 05/19/2021 15:41   CT Angio Chest PE W and/or Wo Contrast  Result Date: 05/19/2021 CLINICAL DATA:  PE suspected EXAM: CT ANGIOGRAPHY CHEST WITH CONTRAST TECHNIQUE: Multidetector CT imaging of the chest was performed using the standard protocol during bolus administration of intravenous contrast. Multiplanar CT image reconstructions and MIPs were obtained to evaluate the vascular anatomy. CONTRAST:  25mL OMNIPAQUE IOHEXOL 350 MG/ML SOLN COMPARISON:  None. FINDINGS: Cardiovascular: Satisfactory opacification of the pulmonary arteries to the segmental level. No evidence of pulmonary embolism. Normal heart size. Three-vessel coronary artery calcifications. No pericardial effusion. Aortic atherosclerosis. Mediastinum/Nodes: No enlarged mediastinal, hilar, or axillary lymph nodes. Thyroid gland, trachea, and esophagus demonstrate no significant findings. Lungs/Pleura: Mild centrilobular emphysema. No pleural effusion or pneumothorax. Upper Abdomen: No acute abnormality. Musculoskeletal: No chest wall abnormality. No acute or significant osseous findings. Review of the MIP images confirms the above findings. IMPRESSION: 1. Negative examination for pulmonary embolism. 2. Emphysema. 3. Coronary artery disease. Aortic Atherosclerosis (ICD10-I70.0) and Emphysema (ICD10-J43.9). Electronically Signed   By: Eddie Candle M.D.   On: 05/19/2021 17:27   CT Cervical Spine Wo Contrast  Result Date: 05/19/2021 CLINICAL DATA:  Fall EXAM: CT HEAD WITHOUT CONTRAST CT CERVICAL SPINE WITHOUT CONTRAST TECHNIQUE: Multidetector CT imaging of the head and cervical spine was performed following the standard protocol without intravenous contrast. Multiplanar CT image reconstructions of the cervical spine were also generated. COMPARISON:  None. FINDINGS: CT HEAD FINDINGS Brain: No evidence of acute infarction, hemorrhage, hydrocephalus,  extra-axial collection or mass lesion/mass effect. Periventricular white matter hypodensity. Vascular: No hyperdense vessel or unexpected calcification. Skull: Normal. Negative for fracture or focal lesion. Sinuses/Orbits: No acute finding. Other: Dense metallic foreign body in the right parietal scalp (series 3, image 46). CT CERVICAL SPINE FINDINGS Alignment: Degenerative straightening and reversal of the normal cervical lordosis. Skull base and vertebrae: No acute fracture. No primary bone lesion or focal pathologic process. Soft tissues and spinal canal: No prevertebral fluid or swelling. No visible canal hematoma. Disc levels: Severe disc degenerative disease and osteophytosis at C4 through C7. Upper chest: Negative. Other: None. IMPRESSION: 1. No acute intracranial pathology. Small-vessel white matter disease. 2. No fracture or static subluxation of the cervical spine. 3. Severe multilevel cervical disc degenerative disease. Electronically Signed   By: Eddie Candle M.D.   On: 05/19/2021 17:18   CT Cervical Spine Wo Contrast  Result Date: 05/19/2021 CLINICAL DATA:  Syncope and fall. EXAM: CT HEAD WITHOUT CONTRAST CT MAXILLOFACIAL WITHOUT CONTRAST CT CERVICAL SPINE WITHOUT CONTRAST TECHNIQUE: Multidetector CT imaging of the head, cervical spine, and maxillofacial structures were performed using the standard protocol  without intravenous contrast. Multiplanar CT image reconstructions of the cervical spine and maxillofacial structures were also generated. COMPARISON:  None. FINDINGS: CT HEAD FINDINGS Brain: No evidence of acute infarction, hemorrhage, hydrocephalus, extra-axial collection or mass lesion/mass effect. Mild generalized cerebral atrophy. Scattered mild periventricular and subcortical white matter hypodensities are nonspecific, but favored to reflect chronic microvascular ischemic changes. Vascular: No hyperdense vessel or unexpected calcification. Skull: Normal. Negative for fracture or focal  lesion. Other: Small radiopaque density in the right parietal scalp. CT MAXILLOFACIAL FINDINGS Osseous: No fracture or mandibular dislocation.  Poor dentition. Orbits: Negative. No traumatic or inflammatory finding. 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 Frothy secretions in the right maxillary sinus. Partial opacification of the ethmoid air cells. The mastoid air cells are clear. Soft tissues: Mild left facial soft tissue swelling. CT CERVICAL SPINE FINDINGS Alignment: No traumatic malalignment. Reversal of the normal cervical lordosis. Trace anterolisthesis at C2-C3, C3-C4, and C7-T1. Trace retrolisthesis at C6-C7. Skull base and vertebrae: No acute fracture. No primary bone lesion or focal pathologic process. Soft tissues and spinal canal: No prevertebral fluid or swelling. No visible canal hematoma. Disc levels: Moderate to severe disc height loss from C4-C5 through C6-C7 with right greater than left uncovertebral hypertrophy. Multilevel facet arthropathy. Upper chest: Mild emphysema. Other: None. IMPRESSION: CT head: 1. No acute intracranial abnormality. Mild cerebral atrophy and chronic microvascular ischemic changes. CT maxillofacial: 1. No acute maxillofacial fracture. Mild left facial soft tissue swelling. 2. 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 CT cervical spine: 1. No acute cervical spine fracture or traumatic listhesis. Moderate to severe degenerative changes of the cervical spine. 2. Emphysema (ICD10-J43.9). Electronically Signed   By: Titus Dubin M.D.   On: 05/19/2021 15:41   CT MAXILLOFACIAL WO CONTRAST  Result Date: 05/19/2021 CLINICAL DATA:  Syncope and fall. EXAM: CT HEAD WITHOUT CONTRAST CT MAXILLOFACIAL WITHOUT CONTRAST CT CERVICAL SPINE WITHOUT CONTRAST TECHNIQUE: Multidetector CT imaging of the head, cervical spine, and maxillofacial structures were performed using the standard protocol  without intravenous contrast. Multiplanar CT image reconstructions of the cervical spine and maxillofacial structures were also generated. COMPARISON:  None. FINDINGS: CT HEAD FINDINGS Brain: No evidence of acute infarction, hemorrhage, hydrocephalus, extra-axial collection or mass lesion/mass effect. Mild generalized cerebral atrophy. Scattered mild periventricular and subcortical white matter hypodensities are nonspecific, but favored to reflect chronic microvascular ischemic changes. Vascular: No hyperdense vessel or unexpected calcification. Skull: Normal. Negative for fracture or focal lesion. Other: Small radiopaque density in the right parietal scalp. CT MAXILLOFACIAL FINDINGS Osseous: No fracture or mandibular dislocation.  Poor dentition. Orbits: Negative. No traumatic or inflammatory finding. 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 Frothy secretions in the right maxillary sinus. Partial opacification of the ethmoid air cells. The mastoid air cells are clear. Soft tissues: Mild left facial soft tissue swelling. CT CERVICAL SPINE FINDINGS Alignment: No traumatic malalignment. Reversal of the normal cervical lordosis. Trace anterolisthesis at C2-C3, C3-C4, and C7-T1. Trace retrolisthesis at C6-C7. Skull base and vertebrae: No acute fracture. No primary bone lesion or focal pathologic process. Soft tissues and spinal canal: No prevertebral fluid or swelling. No visible canal hematoma. Disc levels: Moderate to severe disc height loss from C4-C5 through C6-C7 with right greater than left uncovertebral hypertrophy. Multilevel facet arthropathy. Upper chest: Mild emphysema. Other: None. IMPRESSION: CT head: 1. No acute intracranial abnormality. Mild cerebral atrophy and chronic microvascular ischemic changes. CT maxillofacial:  1. No acute maxillofacial fracture. Mild left facial soft tissue swelling. 2. Chronic left maxillary sinusitis with a mucocele extending  into the nasal cavity. CT cervical spine: 1. No acute cervical spine fracture or traumatic listhesis. Moderate to severe degenerative changes of the cervical spine. 2. Emphysema (ICD10-J43.9). Electronically Signed   By: Titus Dubin M.D.   On: 05/19/2021 15:41     EKG: Independently reviewed, with result as described above.    Assessment/Plan   Kyle Larson is a 65 y.o. male with medical history significant for tobacco abuse, who is admitted to [facility] on 05/19/2021 with syncope after presenting from home to [facility] ED complaining of episode of loss of consciousness.    Principal Problem:   Syncope Active Problems:   Chest pain   Acute hyponatremia   Leukocytosis   Tobacco abuse      #) Syncope: Single episode of syncope occurring earlier this afternoon without associated prodrome, which, in the context the patient's report of new onset exertional chest pain over the course of the last 2 weeks is concerning for possibility of cardiogenic contribution, including increased risk for ventricular arrhythmia in the absence of prodrome.  Of note, patient is experienced no chest pain in the minutes leading up to the syncopal episode, has experienced no subsequent chest pain either, including none in the ED.  High-sensitivity troponin I x2 have been nonelevated.  Differential also includes orthostatic hypotension in the setting of clinical evidence of mild dehydration, particular given that the patient had just stood up and getting out of his car to pump gas.  No evidence to suggest acute stroke or seizures.  We will continue to evaluate for underlying cardiogenic contributions, including further trending of serial troponin, monitoring on telemetry, and cardiology consultation to assist with evaluation of the recent development of typical sounding chest pain, as further detailed below.  We will also check orthostatic vital signs, but with the caveat that the patient has  already received a 1 L LR fluid bolus in the ED, potentially compromising the accuracy of these ensuing findings.  Not on any and hypertensive medications as an outpatient, and the patient denies any known underlying diabetes.    Plan: I have placed a nursing communication order requesting that orthostatic vital signs x 1 set be checked and documented, following which will initiate gentle IV fluids in the form of 50 cc/hr given clinical appearance of mild dehydration.  Continue to trend serial troponin.  Close monitoring on telemetry.  Monitor strict I's and O's.  Repeat CMP and CBC in the morning.  In the setting of evidence of a single PVC on presenting EKG along with lack of prodrome associated with patient's syncope, will provide magnesium sulfate 2 g IV every 2 hours x1 dose now, with plan to repeat serum magnesium level in the morning.  Further evaluation management 2 weeks of intermittent chest pain, as further detailed below, including echocardiogram in the morning as well as cardiology consultation.  Check bilateral carotid ultrasound.  Full dose aspirin x1 administered.        #) Chest pain: Exertional chest pain over the last 2 weeks, which she reports is new for him, but in the absence of increasing frequency or associated increase in intensity over that timeframe.  He has not taken any nitroglycerin at to assess response to this intervention.  He denies any known underlying coronary artery disease, although CTA chest today shows evidence of CAD.  Denies any prior coronary ischemic  evaluation, including no prior stress test or coronary angiography, although it appears that the patient was recommended to pursue stress test in February 2022, but refrain from doing so on the basis of prohibitive concerns, namely that of financial constraints, as further detailed above.  No chest pain in the ED thus far.  High-sensitivity troponin I x2 have been nonelevated, which is somewhat reassuring given that  the patient's chest pain has been occurring over the course of the last 2 weeks, providing ample time for interval elevation.  Of note, CTA chest performed today showed no evidence of acute pulmonary embolism, infiltrate, edema, effusion, or pneumothorax.  No evidence of underlying pneumonia.   Plan: Monitor on telemetry.  Trend serial troponin.  Supplement magnesium, as above, with plan to repeat serum magnesium level in the morning.  Echocardiogram in the morning.  Full dose aspirin x1 now.  Cardiology consult.  Check urinary drug screen.  Repeat BMP in the morning.       #) Acute hypo-osmolar hyponatremia: Presenting serum sodium noted to be 126, without need for glycemic adjustment. Suspect an element of hypovolumeia, given clinical evidence of such, including physical exam evidence of dry oral mucosa membranes.  Differential also includes possibility of contribution from SIADH.  Additionally, while less likely, differential includes cerebral salt wasting syndrome given syncope with hitting head earlier today.  In general we will provide gentle IV fluids to attend to suspected contribution from dehydration, while further evaluating for any additional contributing factors, including SIADH, as further detailed below.  Of note, TSH was checked in the ED today and found to be within normal limits.  No overt pharmacologic contributions.  Of note, no evidence of associated acute focal neurologic deficits.  No evidence to suggest acute volume overload, including no evidence of pulmonary edema on chest x-ray or CTA chest.   Plan: monitor strict I's and O's and daily weights.  random urine sodium, urine osmolality.  Check serum osmolality to confirm suspected hypoosmolar etiology.  Repeat BMP in the morning. Gentle IVF's in the form of normal saline at 50 cc/h x 8 hours.  Add on serum alcohol level.       #) Leukocytosis: Mildly elevated white blood cell count of 13,800.  No evidence of underlying  infectious process at this time, including no evidence of UTI, pneumonia, and COVID-19/influenza were found to be negative when checked in the ED today.  Rather, suspect potential multifactorial and noninfectious contributions stemming from hemoconcentration on the basis of suspected mild dehydration as well as potential inflammatory contribution given syncope with fall to the ground, as further detailed above.  Plan: Gentle IV fluids, as above.  Repeat CBC in the morning.        #) Chronic tobacco abuse: Patient reports that he is a current smoker, having smoked approximately 1 pack/day over the last at least 15 to 20 years.  Plan: Counseled the patient on the importance of complete smoking discontinuation.  Nicotine patch ordered for use during this hospitalization.       DVT prophylaxis: SCDs Code Status: Full code Family Communication: none Disposition Plan: Per Rounding Team Admission status: Observation; med telemetry     Of note, this patient was added by me to the following Admit List/Treatment Team: [facility].      PLEASE NOTE THAT DRAGON DICTATION SOFTWARE WAS USED IN THE CONSTRUCTION OF THIS NOTE.   [facility] Pager [phone] From 6PM - 6AM  Otherwise, please contact night-coverage  [email or web address]  Password [facility]   05/19/2021, 6:36 PM

## 2021-05-19 NOTE — Progress Notes (Signed)
Brief note regarding plan, with full H&P to follow:  65 year old male who is admitted for further evaluation and management of episode of syncope earlier today.  Of note, the patient has been experiencing intermittent exertional chest pain over the course of the last 2 weeks, but no chest pain in the ED.  In spite of 2 weeks of this complaint, high-sensitivity troponin I x2 have been found to be 5 on both occasions in the ED today.  Will check orthostatic vital signs and continue to trend serial troponin.  Echocardiogram in morning.  Close monitoring on telemetry overnight. Full aspirin x 1 ordered.      Babs Bertin, DO Hospitalist

## 2021-05-19 NOTE — ED Provider Notes (Signed)
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  Hyponatremia    Medications  nitroGLYCERIN (NITROSTAT) SL tablet 0.4 mg (has no administration in time range)  Tdap (BOOSTRIX) injection 0.5 mL (0.5 mLs Intramuscular Given 05/19/21 1616)  lactated ringers bolus 1,000 mL (0 mLs Intravenous Stopped 05/19/21 1801)  iohexol (OMNIPAQUE) 350 MG/ML injection 75 mL (75 mLs Intravenous Contrast Given 05/19/21 1657)     ED Discharge Orders     None        Note:  This document was prepared using Dragon voice recognition software and may include unintentional dictation errors.    Lucrezia Starch, MD 05/19/21 [phone]

## 2021-05-19 NOTE — ED Provider Notes (Signed)
Emergency Medicine Provider Triage Evaluation Note  Kyle Larson , a 65 y.o. male  was evaluated in triage.  Pt complains of pumping gas at gas station and waking up on the ground. Patient has laceration above left eye. He reports upon waking having severe pain to the chest, feeling like something is sitting on it. Patient has known heart problems but is unable to voice to me what they are. States he has been taking a friend's blood thinner, 20mg  but is unable to name which one. He denies any headache, nausea or vomiting.  Review of Systems  Positive: Chest pain, syncope, mild shortness of breath Negative: headache  Physical Exam  BP (!) 173/96 (BP Location: Left Arm)   Pulse 91   Resp 20   Ht 6' (1.829 m)   Wt 67.1 kg   SpO2 99%   BMI 20.07 kg/m  Gen:   Awake, alert in wheelchair. Resp:  Normal effort MSK:   Moves extremities without difficulty  Other:    Medical Decision Making  Medically screening exam initiated at 3:06 PM.  Appropriate orders placed.  Arrow Achor was informed that the remainder of the evaluation will be completed by another provider, this initial triage assessment does not replace that evaluation, and the importance of remaining in the ED until their evaluation is complete.     Marlana Salvage, PA 05/19/21 1509    Lucrezia Starch, MD 05/19/21 [phone]

## 2021-05-20 ENCOUNTER — Encounter: Payer: Self-pay | Admitting: Cardiovascular Disease

## 2021-05-20 ENCOUNTER — Encounter
Admission: EM | Disposition: A | Discharge status: Home / Self Care | Payer: Self-pay | Source: Home / Self Care | Attending: Internal Medicine

## 2021-05-20 ENCOUNTER — Observation Stay
Admit: 2021-05-20 | Discharge: 2021-05-20 | Disposition: A | Discharge status: Home / Self Care | Payer: Self-pay | Attending: Internal Medicine | Admitting: Internal Medicine

## 2021-05-20 ENCOUNTER — Observation Stay: Payer: Self-pay

## 2021-05-20 DIAGNOSIS — R079 Chest pain, unspecified: Secondary | ICD-10-CM | POA: Diagnosis present

## 2021-05-20 DIAGNOSIS — Z720 Tobacco use: Secondary | ICD-10-CM | POA: Diagnosis present

## 2021-05-20 DIAGNOSIS — E871 Hypo-osmolality and hyponatremia: Secondary | ICD-10-CM

## 2021-05-20 DIAGNOSIS — R55 Syncope and collapse: Secondary | ICD-10-CM

## 2021-05-20 DIAGNOSIS — F1010 Alcohol abuse, uncomplicated: Secondary | ICD-10-CM

## 2021-05-20 DIAGNOSIS — D72829 Elevated white blood cell count, unspecified: Secondary | ICD-10-CM | POA: Diagnosis present

## 2021-05-20 HISTORY — PX: LEFT HEART CATH AND CORONARY ANGIOGRAPHY: CATH118249

## 2021-05-20 LAB — LIPID PANEL
Cholesterol: 127 mg/dL (ref 0–200)
HDL: 84 mg/dL (ref >40)
LDL Cholesterol: 39 mg/dL (ref 0–99)
Total CHOL/HDL Ratio: 1.5 RATIO
Triglycerides: 21 mg/dL (ref <150)
VLDL: 4 mg/dL (ref 0–40)

## 2021-05-20 LAB — URINE DRUG SCREEN, QUALITATIVE (ARMC ONLY)
Amphetamines, Ur Screen: NOT DETECTED
Barbiturates, Ur Screen: NOT DETECTED
Benzodiazepine, Ur Scrn: NOT DETECTED
Cannabinoid 50 Ng, Ur ~~LOC~~: NOT DETECTED
Cocaine Metabolite,Ur ~~LOC~~: NOT DETECTED
MDMA (Ecstasy)Ur Screen: NOT DETECTED
Methadone Scn, Ur: NOT DETECTED
Opiate, Ur Screen: NOT DETECTED
Phencyclidine (PCP) Ur S: NOT DETECTED
Tricyclic, Ur Screen: NOT DETECTED

## 2021-05-20 LAB — COMPREHENSIVE METABOLIC PANEL
ALT: 27 U/L (ref 0–44)
AST: 38 U/L (ref 15–41)
Albumin: 3.3 g/dL — ABNORMAL LOW (ref 3.5–5.0)
Alkaline Phosphatase: 52 U/L (ref 38–126)
Anion gap: 9 (ref 5–15)
BUN: 6 mg/dL — ABNORMAL LOW (ref 8–23)
CO2: 25 mmol/L (ref 22–32)
Calcium: 8.6 mg/dL — ABNORMAL LOW (ref 8.9–10.3)
Chloride: 96 mmol/L — ABNORMAL LOW (ref 98–111)
Creatinine, Ser: 0.5 mg/dL — ABNORMAL LOW (ref 0.61–1.24)
GFR, Estimated: >60 mL/min (ref >60)
Glucose, Bld: 74 mg/dL (ref 70–99)
Potassium: 4.3 mmol/L (ref 3.5–5.1)
Sodium: 130 mmol/L — ABNORMAL LOW (ref 135–145)
Total Bilirubin: 0.9 mg/dL (ref 0.3–1.2)
Total Protein: 6.6 g/dL (ref 6.5–8.1)

## 2021-05-20 LAB — CBC
HCT: 35.1 % — ABNORMAL LOW (ref 39.0–52.0)
HCT: 36.8 % — ABNORMAL LOW (ref 39.0–52.0)
Hemoglobin: 12.2 g/dL — ABNORMAL LOW (ref 13.0–17.0)
Hemoglobin: 12.6 g/dL — ABNORMAL LOW (ref 13.0–17.0)
MCH: 28 pg (ref 26.0–34.0)
MCH: 27.8 pg (ref 26.0–34.0)
MCHC: 34.8 g/dL (ref 30.0–36.0)
MCHC: 34.2 g/dL (ref 30.0–36.0)
MCV: 80.5 fL (ref 80.0–100.0)
MCV: 81.1 fL (ref 80.0–100.0)
Platelets: 409 10*3/uL — ABNORMAL HIGH (ref 150–400)
Platelets: 409 10*3/uL — ABNORMAL HIGH (ref 150–400)
RBC: 4.36 MIL/uL (ref 4.22–5.81)
RBC: 4.54 MIL/uL (ref 4.22–5.81)
RDW: 17.2 % — ABNORMAL HIGH (ref 11.5–15.5)
RDW: 17 % — ABNORMAL HIGH (ref 11.5–15.5)
WBC: 9.8 10*3/uL (ref 4.0–10.5)
WBC: 10.1 10*3/uL (ref 4.0–10.5)
nRBC: 0 % (ref 0.0–0.2)
nRBC: 0 % (ref 0.0–0.2)

## 2021-05-20 LAB — ETHANOL: Alcohol, Ethyl (B): <10 mg/dL (ref <10)

## 2021-05-20 LAB — BASIC METABOLIC PANEL
Anion gap: 7 (ref 5–15)
BUN: 6 mg/dL — ABNORMAL LOW (ref 8–23)
CO2: 26 mmol/L (ref 22–32)
Calcium: 8.9 mg/dL (ref 8.9–10.3)
Chloride: 96 mmol/L — ABNORMAL LOW (ref 98–111)
Creatinine, Ser: 0.56 mg/dL — ABNORMAL LOW (ref 0.61–1.24)
GFR, Estimated: >60 mL/min (ref >60)
Glucose, Bld: 103 mg/dL — ABNORMAL HIGH (ref 70–99)
Potassium: 3.8 mmol/L (ref 3.5–5.1)
Sodium: 129 mmol/L — ABNORMAL LOW (ref 135–145)

## 2021-05-20 LAB — ECHOCARDIOGRAM COMPLETE
AV Mean grad: 2 mmHg
AV Peak grad: 3.4 mmHg
Ao pk vel: 0.92 m/s
Area-P 1/2: 7.66 cm2
Height: 72 in
S' Lateral: 2.98 cm
Weight: 1843.2 oz

## 2021-05-20 LAB — PROTIME-INR
INR: 1 (ref 0.8–1.2)
Prothrombin Time: 12.7 seconds (ref 11.4–15.2)

## 2021-05-20 LAB — OSMOLALITY: Osmolality: 269 mOsm/kg — ABNORMAL LOW (ref 275–295)

## 2021-05-20 LAB — MAGNESIUM: Magnesium: 2.4 mg/dL (ref 1.7–2.4)

## 2021-05-20 LAB — HEPATITIS C ANTIBODY: HCV Ab: NONREACTIVE

## 2021-05-20 LAB — HIV ANTIBODY (ROUTINE TESTING W REFLEX): HIV Screen 4th Generation wRfx: NONREACTIVE

## 2021-05-20 LAB — TSH: TSH: 3.919 u[IU]/mL (ref 0.350–4.500)

## 2021-05-20 LAB — OSMOLALITY, URINE: Osmolality, Ur: 170 mOsm/kg — ABNORMAL LOW (ref 300–900)

## 2021-05-20 LAB — TROPONIN I (HIGH SENSITIVITY): Troponin I (High Sensitivity): 7 ng/L (ref <18)

## 2021-05-20 LAB — SODIUM, URINE, RANDOM: Sodium, Ur: 26 mmol/L

## 2021-05-20 SURGERY — LEFT HEART CATH AND CORONARY ANGIOGRAPHY
Anesthesia: Moderate Sedation

## 2021-05-20 MED ORDER — FENTANYL CITRATE (PF) 100 MCG/2ML IJ SOLN
INTRAMUSCULAR | Status: AC
  Filled 2021-05-20: qty 2

## 2021-05-20 MED ORDER — SODIUM CHLORIDE 0.9% FLUSH: 3.0000 mL | INTRAVENOUS | Status: DC | PRN

## 2021-05-20 MED ORDER — SODIUM CHLORIDE 0.9 % IV SOLN: 250.0000 mL | INTRAVENOUS | Status: DC | PRN

## 2021-05-20 MED ORDER — ASPIRIN 81 MG PO CHEW
CHEWABLE_TABLET | ORAL | Status: AC
  Filled 2021-05-20: qty 1

## 2021-05-20 MED ORDER — IOHEXOL 300 MG/ML  SOLN
INTRAMUSCULAR | Status: DC | PRN
  Administered 2021-05-20: 54 mL

## 2021-05-20 MED ORDER — MIDAZOLAM HCL 2 MG/2ML IJ SOLN
INTRAMUSCULAR | Status: AC
  Filled 2021-05-20: qty 2

## 2021-05-20 MED ORDER — ENSURE ENLIVE PO LIQD: 237.0000 mL | Freq: Three times a day (TID) | ORAL | Status: DC

## 2021-05-20 MED ORDER — HEPARIN (PORCINE) IN NACL 2000-0.9 UNIT/L-% IV SOLN
INTRAVENOUS | Status: DC | PRN
Start: 2021-05-20 — End: 2021-05-20
  Administered 2021-05-20: 1000 mL

## 2021-05-20 MED ORDER — SODIUM CHLORIDE 0.9% FLUSH
3.0000 mL | Freq: Two times a day (BID) | INTRAVENOUS | Status: DC
  Administered 2021-05-20: 3 mL via INTRAVENOUS

## 2021-05-20 MED ORDER — SODIUM CHLORIDE 0.9 % WEIGHT BASED INFUSION: 1.0000 mL/kg/h | INTRAVENOUS | Status: DC

## 2021-05-20 MED ORDER — FENTANYL CITRATE (PF) 100 MCG/2ML IJ SOLN
INTRAMUSCULAR | Status: DC | PRN
  Administered 2021-05-20: 50 ug via INTRAVENOUS

## 2021-05-20 MED ORDER — SODIUM CHLORIDE 0.9% FLUSH: 3.0000 mL | Freq: Two times a day (BID) | INTRAVENOUS | Status: DC

## 2021-05-20 MED ORDER — MIDAZOLAM HCL 2 MG/2ML IJ SOLN
INTRAMUSCULAR | Status: DC | PRN
Start: 2021-05-20 — End: 2021-05-20
  Administered 2021-05-20: 2 mg via INTRAVENOUS

## 2021-05-20 MED ORDER — ADULT MULTIVITAMIN W/MINERALS CH: 1.0000 | ORAL_TABLET | Freq: Every day | ORAL | Status: DC

## 2021-05-20 MED ORDER — SODIUM CHLORIDE 0.9 % IV SOLN: INTRAVENOUS | Status: AC

## 2021-05-20 MED ORDER — SODIUM CHLORIDE 0.9 % IV SOLN: INTRAVENOUS | Status: DC

## 2021-05-20 MED ORDER — MAGNESIUM SULFATE 2 GM/50ML IV SOLN
2.0000 g | Freq: Once | INTRAVENOUS | Status: AC
  Administered 2021-05-20: 2 g via INTRAVENOUS
  Filled 2021-05-20: qty 50

## 2021-05-20 MED ORDER — SODIUM CHLORIDE 0.9 % WEIGHT BASED INFUSION: 3.0000 mL/kg/h | INTRAVENOUS | Status: DC

## 2021-05-20 MED ORDER — LIDOCAINE HCL (PF) 1 % IJ SOLN
INTRAMUSCULAR | Status: AC
  Filled 2021-05-20: qty 30

## 2021-05-20 MED ORDER — LIDOCAINE HCL (PF) 1 % IJ SOLN
INTRAMUSCULAR | Status: DC | PRN
  Administered 2021-05-20: 20 mL

## 2021-05-20 MED ORDER — ACETAMINOPHEN 325 MG PO TABS: 650.0000 mg | ORAL_TABLET | ORAL | Status: DC | PRN

## 2021-05-20 MED ORDER — ONDANSETRON HCL 4 MG/2ML IJ SOLN: 4.0000 mg | Freq: Four times a day (QID) | INTRAMUSCULAR | Status: DC | PRN

## 2021-05-20 MED ORDER — LIDOCAINE 5 % EX PTCH
1.0000 | MEDICATED_PATCH | CUTANEOUS | Status: DC
  Administered 2021-05-20: 1 via TRANSDERMAL
  Filled 2021-05-20: qty 1

## 2021-05-20 MED ORDER — HEPARIN (PORCINE) IN NACL 1000-0.9 UT/500ML-% IV SOLN
INTRAVENOUS | Status: AC
  Filled 2021-05-20: qty 1000

## 2021-05-20 MED ORDER — HEPARIN SODIUM (PORCINE) 1000 UNIT/ML IJ SOLN
INTRAMUSCULAR | Status: AC
  Filled 2021-05-20: qty 1

## 2021-05-20 MED ORDER — HYDRALAZINE HCL 20 MG/ML IJ SOLN: 10.0000 mg | INTRAMUSCULAR | Status: DC | PRN

## 2021-05-20 MED ORDER — ASPIRIN 81 MG PO CHEW
81.0000 mg | CHEWABLE_TABLET | ORAL | Status: AC
  Administered 2021-05-20: 81 mg via ORAL

## 2021-05-20 MED ORDER — LABETALOL HCL 5 MG/ML IV SOLN: 10.0000 mg | INTRAVENOUS | Status: DC | PRN

## 2021-05-20 SURGICAL SUPPLY — 11 items
CATH INFINITI 5FR ANG PIGTAIL (CATHETERS) ×3 IMPLANT
CATH INFINITI 5FR JL4 (CATHETERS) ×3 IMPLANT
CATH INFINITI JR4 5F (CATHETERS) ×3 IMPLANT
DEVICE CLOSURE MYNXGRIP 5F (Vascular Products) ×3 IMPLANT
NEEDLE PERC 18GX7CM (NEEDLE) ×3 IMPLANT
PACK CARDIAC CATH (CUSTOM PROCEDURE TRAY) ×3 IMPLANT
PROTECTION STATION PRESSURIZED (MISCELLANEOUS) ×3
SET ATX SIMPLICITY (MISCELLANEOUS) ×3 IMPLANT
SHEATH AVANTI 5FR X 11CM (SHEATH) ×3 IMPLANT
STATION PROTECTION PRESSURIZED (MISCELLANEOUS) ×1 IMPLANT
WIRE GUIDERIGHT .035X150 (WIRE) ×3 IMPLANT

## 2021-05-20 NOTE — Progress Notes (Signed)
Patient has 30% proximal LAD irregularities and normal LCX/RCA and EF. Cardiac point of view, may go home with f/u this Thursday 9 am.

## 2021-05-20 NOTE — Progress Notes (Signed)
Patient out of room to specials for cardiac cath in stable condition with wife at bedside. Patient to transfer to 2A from specials. Will not return to 1C

## 2021-05-20 NOTE — Progress Notes (Signed)
*  PRELIMINARY RESULTS* Echocardiogram 2D Echocardiogram has been performed.  Kyle Larson 05/20/2021, 9:26 AM

## 2021-05-20 NOTE — Progress Notes (Signed)
  Update: per general [facility] subspecialist preference, non-emergent cardiology consult request sent to Dr. Ronny Flurry via Staff Message to be released at 0600 on 05/20/21.     Babs Bertin, DO Hospitalist

## 2021-05-20 NOTE — Progress Notes (Addendum)
PROGRESS NOTE    Kyle Larson  VOH:[id number] DOB: March 16, 1956 DOA: 05/19/2021 PCP: [name], MD  Outpatient Specialists: none    Brief Narrative:   Hx tobacco abuse, alcohol abuse, denies regular medical care, presenting with syncope. Has had 2 episodes of syncope in the last few weeks. One while driving, and another yesterday while standing pumping gas. No associated prodrome. Also brief episodes of non-exertional chest pain.    Assessment & Plan:   Principal Problem:   Syncope Active Problems:   Chest pain   Acute hyponatremia   Leukocytosis   Tobacco abuse   Alcohol abuse  # Syncope # Chest pain EKG without sig disturbance. Has mild chronic hyponatremia. Is heavy drinker, dehydration could play a significant role. Sig cardiac risk factors. No events on tele other than frequent PVCs. CTA neg for PE. Carotid dopplers neg. UDS neg - cardiology has seen, plan for cath later today - TTE pending - maintain on tele - hydrate w/ NS - f/u lipid panel, a1c  # Hyponatremia Mild, chronic, likely 2/2 etoh - fluids as above - trend  # Alcohol abuse 8-12 drinks most days. Denies history of withdrawal - monitor for s/s withdrawal - f/u hcv, hiv  # Tobacco abuse - nicotine patch  # Chronic right shoulder pain - lidocaine patch   DVT prophylaxis: SCDs Code Status: full Family Communication: none @ bedside  Level of care: Med-Surg Status is: inpt  The patient will require care spanning > 2 midnights and should be moved to inpatient because: Inpatient level of care appropriate due to severity of illness  Dispo: The patient is from: Home              Anticipated d/c is to: Home              Patient currently is not medically stable to d/c.   Difficult to place patient No        Consultants:  cardiology  Procedures: none  Antimicrobials:  none    Subjective: This morning no chest pain, no sob, no cough, no fever  Objective: Vitals:    05/20/21 0100 05/20/21 0348 05/20/21 0512 05/20/21 0800  BP:  131/88  130/80  Pulse:  82  80  Resp: 18 18  18   Temp:  97.9 F (36.6 C)  98 F (36.7 C)  TempSrc:    Oral  SpO2:  100%  99%  Weight:   52.3 kg   Height:        Intake/Output Summary (Last 24 hours) at 05/20/2021 0925 Last data filed at 05/20/2021 0800 Gross per 24 hour  Intake 1258.39 ml  Output 1600 ml  Net -341.61 ml   Filed Weights   05/19/21 1503 05/19/21 1955 05/20/21 0512  Weight: 67.1 kg 52.3 kg 52.3 kg    Examination:  General exam: Appears calm and comfortable  Respiratory system: scatt rhonchi Cardiovascular system: S1 & S2 heard, RRR. Soft systolic murmur Gastrointestinal system: Abdomen is nondistended, soft and nontender. No organomegaly or masses felt. Normal bowel sounds heard. Central nervous system: Alert and oriented. No focal neurological deficits. Extremities: Symmetric 5 x 5 power. Skin: No rashes, lesions or ulcers Psychiatry: Judgement and insight appear normal. Mood & affect appropriate.     Data Reviewed: I have personally reviewed following labs and imaging studies  CBC: Recent Labs  Lab 05/19/21 1506 05/20/21 0418  WBC 13.8* 9.8  HGB 12.5* 12.2*  HCT 35.5* 35.1*  MCV 80.0 80.5  PLT 412*  546*   Basic Metabolic Panel: Recent Labs  Lab 05/19/21 1506 05/19/21 1521 05/19/21 1831 05/20/21 0418  NA 126*  --   --  130*  K 4.4  --   --  4.3  CL 93*  --   --  96*  CO2 23  --   --  25  GLUCOSE 97  --   --  74  BUN 6*  --   --  6*  CREATININE 0.53*  --   --  0.50*  CALCIUM 9.1  --   --  8.6*  MG  --  1.7 1.7 2.4   GFR: Estimated Creatinine Clearance: 69 mL/min (A) (by C-G formula based on SCr of 0.5 mg/dL (L)). Liver Function Tests: Recent Labs  Lab 05/19/21 1521 05/20/21 0418  AST 50* 38  ALT 31 27  ALKPHOS 57 52  BILITOT 0.5 0.9  PROT 7.2 6.6  ALBUMIN 3.7 3.3*   No results for input(s): LIPASE, AMYLASE in the last 168 hours. No results for input(s): AMMONIA  in the last 168 hours. Coagulation Profile: No results for input(s): INR, PROTIME in the last 168 hours. Cardiac Enzymes: No results for input(s): CKTOTAL, CKMB, CKMBINDEX, TROPONINI in the last 168 hours. BNP (last 3 results) No results for input(s): PROBNP in the last 8760 hours. HbA1C: No results for input(s): HGBA1C in the last 72 hours. CBG: No results for input(s): GLUCAP in the last 168 hours. Lipid Profile: No results for input(s): CHOL, HDL, LDLCALC, TRIG, CHOLHDL, LDLDIRECT in the last 72 hours. Thyroid Function Tests: Recent Labs    05/19/21 1611  TSH 1.963   Anemia Panel: No results for input(s): VITAMINB12, FOLATE, FERRITIN, TIBC, IRON, RETICCTPCT in the last 72 hours. Urine analysis:    Component Value Date/Time   COLORURINE STRAW (A) 05/19/2021 2048   APPEARANCEUR CLEAR (A) 05/19/2021 2048   LABSPEC 1.017 05/19/2021 2048   PHURINE 7.0 05/19/2021 2048   GLUCOSEU NEGATIVE 05/19/2021 2048   HGBUR NEGATIVE 05/19/2021 2048   BILIRUBINUR NEGATIVE 05/19/2021 2048   KETONESUR 5 (A) 05/19/2021 2048   PROTEINUR NEGATIVE 05/19/2021 2048   NITRITE NEGATIVE 05/19/2021 2048   LEUKOCYTESUR NEGATIVE 05/19/2021 2048   Sepsis Labs: @LABRCNTIP (procalcitonin:4,lacticidven:4)  ) Recent Results (from the past 240 hour(s))  Resp Panel by RT-PCR (Flu A&B, Covid) Nasopharyngeal Swab     Status: None   Collection Time: 05/19/21  4:09 PM   Specimen: Nasopharyngeal Swab; Nasopharyngeal(NP) swabs in vial transport medium  Result Value Ref Range Status   SARS Coronavirus 2 by RT PCR NEGATIVE NEGATIVE Final    Comment: (NOTE) SARS-CoV-2 target nucleic acids are NOT DETECTED.  The SARS-CoV-2 RNA is generally detectable in upper respiratory specimens during the acute phase of infection. The lowest concentration of SARS-CoV-2 viral copies this assay can detect is 138 copies/mL. A negative result does not preclude SARS-Cov-2 infection and should not be used as the sole basis for  treatment or other patient management decisions. A negative result may occur with  improper specimen collection/handling, submission of specimen other than nasopharyngeal swab, presence of viral mutation(s) within the areas targeted by this assay, and inadequate number of viral copies(<138 copies/mL). A negative result must be combined with clinical observations, patient history, and epidemiological information. The expected result is Negative.  Fact Sheet for Patients:  EntrepreneurPulse.com.au  Fact Sheet for Healthcare Providers:  IncredibleEmployment.be  This test is no t yet approved or cleared by the [place] FDA and  has been authorized for detection and/or  diagnosis of SARS-CoV-2 by FDA under an Emergency Use Authorization (EUA). This EUA will remain  in effect (meaning this test can be used) for the duration of the COVID-19 declaration under Section 564(b)(1) of the Act, 21 U.S.C.section 360bbb-3(b)(1), unless the authorization is terminated  or revoked sooner.       Influenza A by PCR NEGATIVE NEGATIVE Final   Influenza B by PCR NEGATIVE NEGATIVE Final    Comment: (NOTE) The Xpert Xpress SARS-CoV-2/FLU/RSV plus assay is intended as an aid in the diagnosis of influenza from Nasopharyngeal swab specimens and should not be used as a sole basis for treatment. Nasal washings and aspirates are unacceptable for Xpert Xpress SARS-CoV-2/FLU/RSV testing.  Fact Sheet for Patients: EntrepreneurPulse.com.au  Fact Sheet for Healthcare Providers: IncredibleEmployment.be  This test is not yet approved or cleared by the [place] FDA and has been authorized for detection and/or diagnosis of SARS-CoV-2 by FDA under an Emergency Use Authorization (EUA). This EUA will remain in effect (meaning this test can be used) for the duration of the COVID-19 declaration under Section 564(b)(1) of the Act, 21  U.S.C. section 360bbb-3(b)(1), unless the authorization is terminated or revoked.  Performed at [facility], [address] [place], [place] [address]          Radiology Studies: DG Chest 2 View  Result Date: 05/19/2021 CLINICAL DATA:  Chest pain EXAM: CHEST - 2 VIEW COMPARISON:  October 03, 2016. FINDINGS: Lungs are hyperinflated with flattening of RIGHT and LEFT hemidiaphragm. No sign of consolidation, pleural effusion or pneumothorax. Cardiomediastinal contours and hilar structures are normal. Signs of prior skeletal trauma about the LEFT hemithorax, signs of ORIF the LEFT clavicle with lucency about the lateral aspect of the plate along the under surface of the acromion and lucency about the lateral screw of the clavicular plate. IMPRESSION: 1. No acute cardiopulmonary disease. 2. Hyperinflation, correlate with any signs of COPD. 3. Signs of prior skeletal trauma about the LEFT hemithorax with signs of hardware loosening. 4. Lucency about hardware in the LEFT distal clavicle. May reflect hardware loosening. Correlate with any signs of infection. Electronically Signed   By: Zetta Bills M.D.   On: 05/19/2021 16:11   DG Shoulder Right  Result Date: 05/19/2021 CLINICAL DATA:  Syncope. EXAM: RIGHT SHOULDER - 2+ VIEW COMPARISON:  None FINDINGS: Shoulder is located without sign of fracture. Soft tissues are unremarkable. High riding humeral head. IMPRESSION: 1. No sign of fracture or dislocation. 2. High riding humeral head suggesting rotator cuff injury. Electronically Signed   By: Zetta Bills M.D.   On: 05/19/2021 16:17   CT Head Wo Contrast  Result Date: 05/19/2021 CLINICAL DATA:  Fall EXAM: CT HEAD WITHOUT CONTRAST CT CERVICAL SPINE WITHOUT CONTRAST TECHNIQUE: Multidetector CT imaging of the head and cervical spine was performed following the standard protocol without intravenous contrast. Multiplanar CT image reconstructions of the cervical spine were also generated.  COMPARISON:  None. FINDINGS: CT HEAD FINDINGS Brain: No evidence of acute infarction, hemorrhage, hydrocephalus, extra-axial collection or mass lesion/mass effect. Periventricular white matter hypodensity. Vascular: No hyperdense vessel or unexpected calcification. Skull: Normal. Negative for fracture or focal lesion. Sinuses/Orbits: No acute finding. Other: Dense metallic foreign body in the right parietal scalp (series 3, image 46). CT CERVICAL SPINE FINDINGS Alignment: Degenerative straightening and reversal of the normal cervical lordosis. Skull base and vertebrae: No acute fracture. No primary bone lesion or focal pathologic process. Soft tissues and spinal canal: No prevertebral fluid or swelling. No visible canal  hematoma. Disc levels: Severe disc degenerative disease and osteophytosis at C4 through C7. Upper chest: Negative. Other: None. IMPRESSION: 1. No acute intracranial pathology. Small-vessel white matter disease. 2. No fracture or static subluxation of the cervical spine. 3. Severe multilevel cervical disc degenerative disease. Electronically Signed   By: Eddie Candle M.D.   On: 05/19/2021 17:18   CT Head Wo Contrast  Result Date: 05/19/2021 CLINICAL DATA:  Syncope and fall. EXAM: CT HEAD WITHOUT CONTRAST CT MAXILLOFACIAL WITHOUT CONTRAST CT CERVICAL SPINE WITHOUT CONTRAST TECHNIQUE: Multidetector CT imaging of the head, cervical spine, and maxillofacial structures were performed using the standard protocol without intravenous contrast. Multiplanar CT image reconstructions of the cervical spine and maxillofacial structures were also generated. COMPARISON:  None. FINDINGS: CT HEAD FINDINGS Brain: No evidence of acute infarction, hemorrhage, hydrocephalus, extra-axial collection or mass lesion/mass effect. Mild generalized cerebral atrophy. Scattered mild periventricular and subcortical white matter hypodensities are nonspecific, but favored to reflect chronic microvascular ischemic changes.  Vascular: No hyperdense vessel or unexpected calcification. Skull: Normal. Negative for fracture or focal lesion. Other: Small radiopaque density in the right parietal scalp. CT MAXILLOFACIAL FINDINGS Osseous: No fracture or mandibular dislocation.  Poor dentition. Orbits: Negative. No traumatic or inflammatory finding. 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 Frothy secretions in the right maxillary sinus. Partial opacification of the ethmoid air cells. The mastoid air cells are clear. Soft tissues: Mild left facial soft tissue swelling. CT CERVICAL SPINE FINDINGS Alignment: No traumatic malalignment. Reversal of the normal cervical lordosis. Trace anterolisthesis at C2-C3, C3-C4, and C7-T1. Trace retrolisthesis at C6-C7. Skull base and vertebrae: No acute fracture. No primary bone lesion or focal pathologic process. Soft tissues and spinal canal: No prevertebral fluid or swelling. No visible canal hematoma. Disc levels: Moderate to severe disc height loss from C4-C5 through C6-C7 with right greater than left uncovertebral hypertrophy. Multilevel facet arthropathy. Upper chest: Mild emphysema. Other: None. IMPRESSION: CT head: 1. No acute intracranial abnormality. Mild cerebral atrophy and chronic microvascular ischemic changes. CT maxillofacial: 1. No acute maxillofacial fracture. Mild left facial soft tissue swelling. 2. 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 CT cervical spine: 1. No acute cervical spine fracture or traumatic listhesis. Moderate to severe degenerative changes of the cervical spine. 2. Emphysema (ICD10-J43.9). Electronically Signed   By: Titus Dubin M.D.   On: 05/19/2021 15:41   CT Angio Chest PE W and/or Wo Contrast  Result Date: 05/19/2021 CLINICAL DATA:  PE suspected EXAM: CT ANGIOGRAPHY CHEST WITH CONTRAST TECHNIQUE: Multidetector CT imaging of the chest was performed using the standard  protocol during bolus administration of intravenous contrast. Multiplanar CT image reconstructions and MIPs were obtained to evaluate the vascular anatomy. CONTRAST:  73mL OMNIPAQUE IOHEXOL 350 MG/ML SOLN COMPARISON:  None. FINDINGS: Cardiovascular: Satisfactory opacification of the pulmonary arteries to the segmental level. No evidence of pulmonary embolism. Normal heart size. Three-vessel coronary artery calcifications. No pericardial effusion. Aortic atherosclerosis. Mediastinum/Nodes: No enlarged mediastinal, hilar, or axillary lymph nodes. Thyroid gland, trachea, and esophagus demonstrate no significant findings. Lungs/Pleura: Mild centrilobular emphysema. No pleural effusion or pneumothorax. Upper Abdomen: No acute abnormality. Musculoskeletal: No chest wall abnormality. No acute or significant osseous findings. Review of the MIP images confirms the above findings. IMPRESSION: 1. Negative examination for pulmonary embolism. 2. Emphysema. 3. Coronary artery disease. Aortic Atherosclerosis (ICD10-I70.0) and Emphysema (ICD10-J43.9). Electronically Signed   By: Eddie Candle M.D.   On: 05/19/2021 17:27   CT  Cervical Spine Wo Contrast  Result Date: 05/19/2021 CLINICAL DATA:  Fall EXAM: CT HEAD WITHOUT CONTRAST CT CERVICAL SPINE WITHOUT CONTRAST TECHNIQUE: Multidetector CT imaging of the head and cervical spine was performed following the standard protocol without intravenous contrast. Multiplanar CT image reconstructions of the cervical spine were also generated. COMPARISON:  None. FINDINGS: CT HEAD FINDINGS Brain: No evidence of acute infarction, hemorrhage, hydrocephalus, extra-axial collection or mass lesion/mass effect. Periventricular white matter hypodensity. Vascular: No hyperdense vessel or unexpected calcification. Skull: Normal. Negative for fracture or focal lesion. Sinuses/Orbits: No acute finding. Other: Dense metallic foreign body in the right parietal scalp (series 3, image 46). CT CERVICAL SPINE  FINDINGS Alignment: Degenerative straightening and reversal of the normal cervical lordosis. Skull base and vertebrae: No acute fracture. No primary bone lesion or focal pathologic process. Soft tissues and spinal canal: No prevertebral fluid or swelling. No visible canal hematoma. Disc levels: Severe disc degenerative disease and osteophytosis at C4 through C7. Upper chest: Negative. Other: None. IMPRESSION: 1. No acute intracranial pathology. Small-vessel white matter disease. 2. No fracture or static subluxation of the cervical spine. 3. Severe multilevel cervical disc degenerative disease. Electronically Signed   By: Eddie Candle M.D.   On: 05/19/2021 17:18   CT Cervical Spine Wo Contrast  Result Date: 05/19/2021 CLINICAL DATA:  Syncope and fall. EXAM: CT HEAD WITHOUT CONTRAST CT MAXILLOFACIAL WITHOUT CONTRAST CT CERVICAL SPINE WITHOUT CONTRAST TECHNIQUE: Multidetector CT imaging of the head, cervical spine, and maxillofacial structures were performed using the standard protocol without intravenous contrast. Multiplanar CT image reconstructions of the cervical spine and maxillofacial structures were also generated. COMPARISON:  None. FINDINGS: CT HEAD FINDINGS Brain: No evidence of acute infarction, hemorrhage, hydrocephalus, extra-axial collection or mass lesion/mass effect. Mild generalized cerebral atrophy. Scattered mild periventricular and subcortical white matter hypodensities are nonspecific, but favored to reflect chronic microvascular ischemic changes. Vascular: No hyperdense vessel or unexpected calcification. Skull: Normal. Negative for fracture or focal lesion. Other: Small radiopaque density in the right parietal scalp. CT MAXILLOFACIAL FINDINGS Osseous: No fracture or mandibular dislocation.  Poor dentition. Orbits: Negative. No traumatic or inflammatory finding. 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 Frothy secretions in the right maxillary  sinus. Partial opacification of the ethmoid air cells. The mastoid air cells are clear. Soft tissues: Mild left facial soft tissue swelling. CT CERVICAL SPINE FINDINGS Alignment: No traumatic malalignment. Reversal of the normal cervical lordosis. Trace anterolisthesis at C2-C3, C3-C4, and C7-T1. Trace retrolisthesis at C6-C7. Skull base and vertebrae: No acute fracture. No primary bone lesion or focal pathologic process. Soft tissues and spinal canal: No prevertebral fluid or swelling. No visible canal hematoma. Disc levels: Moderate to severe disc height loss from C4-C5 through C6-C7 with right greater than left uncovertebral hypertrophy. Multilevel facet arthropathy. Upper chest: Mild emphysema. Other: None. IMPRESSION: CT head: 1. No acute intracranial abnormality. Mild cerebral atrophy and chronic microvascular ischemic changes. CT maxillofacial: 1. No acute maxillofacial fracture. Mild left facial soft tissue swelling. 2. 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 CT cervical spine: 1. No acute cervical spine fracture or traumatic listhesis. Moderate to severe degenerative changes of the cervical spine. 2. Emphysema (ICD10-J43.9). Electronically Signed   By: Titus Dubin M.D.   On: 05/19/2021 15:41   US Carotid Bilateral  Result Date: 05/20/2021 CLINICAL DATA:  65 year old male with history of syncope. EXAM: BILATERAL CAROTID DUPLEX ULTRASOUND TECHNIQUE: Pearline Cables scale imaging, color Doppler and duplex ultrasound  were performed of bilateral carotid and vertebral arteries in the neck. COMPARISON:  None. FINDINGS: Criteria: Quantification of carotid stenosis is based on velocity parameters that correlate the residual internal carotid diameter with NASCET-based stenosis levels, using the diameter of the distal internal carotid lumen as the denominator for stenosis measurement. The following velocity measurements were obtained: RIGHT ICA: Peak systolic velocity 85 cm/sec,  End diastolic velocity 20 cm/sec CCA: Peak systolic velocity 56 cm/sec SYSTOLIC ICA/CCA RATIO:  1.5 ECA: Peak systolic velocity 60 cm/sec LEFT ICA: Peak systolic velocity 58 cm/sec, End diastolic velocity 26 cm/sec CCA: 44 cm/sec SYSTOLIC ICA/CCA RATIO:  1.3 ECA: 84 cm/sec RIGHT CAROTID ARTERY: No atherosclerotic plaque formation. No significant tortuosity. Normal low resistance waveforms. RIGHT VERTEBRAL ARTERY:  Antegrade flow. LEFT CAROTID ARTERY: No atherosclerotic plaque formation. No significant tortuosity. Normal low resistance waveforms. LEFT VERTEBRAL ARTERY:  Antegrade flow. Upper extremity non-invasive blood pressures: Not obtained. IMPRESSION: 1. Right carotid artery system: Patent without significant atherosclerotic plaque formation. 2. Left carotid artery system: Patent without significant atherosclerotic plaque formation. 3.  Vertebral artery system: Patent with antegrade flow bilaterally. Ruthann Cancer, MD [facility] Electronically Signed   By: Ruthann Cancer MD   On: 05/20/2021 09:05   CT MAXILLOFACIAL WO CONTRAST  Result Date: 05/19/2021 CLINICAL DATA:  Syncope and fall. EXAM: CT HEAD WITHOUT CONTRAST CT MAXILLOFACIAL WITHOUT CONTRAST CT CERVICAL SPINE WITHOUT CONTRAST TECHNIQUE: Multidetector CT imaging of the head, cervical spine, and maxillofacial structures were performed using the standard protocol without intravenous contrast. Multiplanar CT image reconstructions of the cervical spine and maxillofacial structures were also generated. COMPARISON:  None. FINDINGS: CT HEAD FINDINGS Brain: No evidence of acute infarction, hemorrhage, hydrocephalus, extra-axial collection or mass lesion/mass effect. Mild generalized cerebral atrophy. Scattered mild periventricular and subcortical white matter hypodensities are nonspecific, but favored to reflect chronic microvascular ischemic changes. Vascular: No hyperdense vessel or unexpected  calcification. Skull: Normal. Negative for fracture or focal lesion. Other: Small radiopaque density in the right parietal scalp. CT MAXILLOFACIAL FINDINGS Osseous: No fracture or mandibular dislocation.  Poor dentition. Orbits: Negative. No traumatic or inflammatory finding. 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 Frothy secretions in the right maxillary sinus. Partial opacification of the ethmoid air cells. The mastoid air cells are clear. Soft tissues: Mild left facial soft tissue swelling. CT CERVICAL SPINE FINDINGS Alignment: No traumatic malalignment. Reversal of the normal cervical lordosis. Trace anterolisthesis at C2-C3, C3-C4, and C7-T1. Trace retrolisthesis at C6-C7. Skull base and vertebrae: No acute fracture. No primary bone lesion or focal pathologic process. Soft tissues and spinal canal: No prevertebral fluid or swelling. No visible canal hematoma. Disc levels: Moderate to severe disc height loss from C4-C5 through C6-C7 with right greater than left uncovertebral hypertrophy. Multilevel facet arthropathy. Upper chest: Mild emphysema. Other: None. IMPRESSION: CT head: 1. No acute intracranial abnormality. Mild cerebral atrophy and chronic microvascular ischemic changes. CT maxillofacial: 1. No acute maxillofacial fracture. Mild left facial soft tissue swelling. 2. 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 CT cervical spine: 1. No acute cervical spine fracture or traumatic listhesis. Moderate to severe degenerative changes of the cervical spine. 2. Emphysema (ICD10-J43.9). Electronically Signed   By: Titus Dubin M.D.   On: 05/19/2021 15:41        Scheduled Meds:  lidocaine  1 patch Transdermal Q24H   nicotine  21 mg Transdermal Daily   sodium chloride flush  3  mL Intravenous Q12H   Continuous Infusions:  sodium chloride       LOS: 0 days    Time spent: 40 min    Desma Maxim, MD [facility]   If 7PM-7AM, please contact night-coverage [email or web address] Password [facility] 05/20/2021, 9:25 AM

## 2021-05-20 NOTE — Progress Notes (Signed)
Initial Nutrition Assessment  DOCUMENTATION CODES:  Underweight  INTERVENTION:  Add Ensure Enlive po TID, each supplement provides 350 kcal and 20 grams of protein.  Add Magic cup TID with meals, each supplement provides 290 kcal and 9 grams of protein.  Add MVI with minerals daily.   NUTRITION DIAGNOSIS:  Increased nutrient needs related to chronic illness as evidenced by estimated needs.  GOAL:  Patient will meet greater than or equal to 90% of their needs  MONITOR:  Diet advancement, PO intake, Supplement acceptance, Labs, Weight trends, I & O's  REASON FOR ASSESSMENT:  Other (Comment) (Low BMI)    ASSESSMENT:  65 yo male with a PMH of tobacco abuse, EtOH abuse, and lack of regular medical care who presents with syncope.  Pt in cardiac cath procedure at time of RD visit. Per MST screen, pt did not report any changes in his weight or appetite. Also, no complaints of GI distress or poor PO intake in H&P.  Per Epic, pt has lost 15 lbs (10%) in the last 4 months, which is significant and severe for the time frame.  Suspect pt is malnourished, but cannot definitively diagnose malnutrition at this time.  Recommend adding Ensure Enlive TID, Magic Cup TID, and MVI with minerals daily to aid with repletion and weight gain.  Medications: reviewed; NaCl @ 125 ml/hr  Labs: reviewed; Na 129 (L), Glucose 103 (H)  NUTRITION - FOCUSED PHYSICAL EXAM: Unable to perform  Diet Order:   Diet Order             Diet NPO time specified  Diet effective now                  EDUCATION NEEDS:  No education needs have been identified at this time  Skin:  Skin Assessment: Reviewed RN Assessment (Dry, flaky skin with abrasion on hand and head)  Last BM:  05/19/21  Height:  Ht Readings from Last 1 Encounters:  05/19/21 6' (1.829 m)   Weight:  Wt Readings from Last 1 Encounters:  05/20/21 61.1 kg   Ideal Body Weight:  81 kg  BMI:  Body mass index is 18.26 kg/m.  Estimated  Nutritional Needs:  Kcal:  2100-2300 Protein:  90-105 grams Fluid:  >2 L  Derrel Nip, RD, LDN (she/her/hers) Registered Dietitian I After-Hours/Weekend Pager # in [place]

## 2021-05-20 NOTE — Consult Note (Signed)
Jourdin Connors is a 65 y.o. male  [id number]  Primary Cardiologist: Neoma Laming Reason for Consultation: Syncope and chest pain  HPI: 65 year old white male with a history of tobacco abuse presented to the hospital with syncope.  Patient states for the past couple weeks has been having chest pain lasting up to few hours described as pressure type associated with shortness of breath.  Yesterday he was at a gas station and all of a sudden while pumping gas he passed out and his wife brought him to the hospital.   Review of Systems: No chest pain at this time no orthopnea PND   Past Medical History:  Diagnosis Date   AC joint dislocation    Metatarsal bone fracture    3rd metatarsal , left foot; April 2021   Syncope    June '22   Tobacco abuse     Medications Prior to Admission  Medication Sig Dispense Refill   ibuprofen (ADVIL) 200 MG tablet Take 400 mg by mouth every 6 (six) hours as needed.     Multiple Vitamin (MULTIVITAMIN) tablet Take 1 tablet by mouth daily.     oxyCODONE (ROXICODONE) 5 MG immediate release tablet Take 1-2 tablets (5-10 mg total) by mouth every 4 (four) hours as needed for severe pain. (Patient not taking: Reported on 03/22/2020) 50 tablet 0      nicotine  21 mg Transdermal Daily    Infusions:  sodium chloride 50 mL/hr at 05/20/21 0602    No Known Allergies  Social History   Socioeconomic History   Marital status: Married    Spouse name: Not on file   Number of children: Not on file   Years of education: Not on file   Highest education level: Not on file  Occupational History   Not on file  Tobacco Use   Smoking status: Every Day    Packs/day: 1.00    Years: 15.00    Pack years: 15.00    Types: Cigarettes   Smokeless tobacco: Never  Substance and Sexual Activity   Alcohol use: Yes    Comment: OCC   Drug use: No   Sexual activity: Not on file  Other Topics Concern   Not on file  Social History Narrative   Not on file   Social  Determinants of Health   Financial Resource Strain: Not on file  Food Insecurity: Not on file  Transportation Needs: Not on file  Physical Activity: Not on file  Stress: Not on file  Social Connections: Not on file  Intimate Partner Violence: Not on file    History reviewed. No pertinent family history.  PHYSICAL EXAM: Vitals:   05/20/21 0348 05/20/21 0800  BP: 131/88 130/80  Pulse: 82 80  Resp: 18 18  Temp: 97.9 F (36.6 C) 98 F (36.7 C)  SpO2: 100% 99%     Intake/Output Summary (Last 24 hours) at 05/20/2021 0847 Last data filed at 05/20/2021 0800 Gross per 24 hour  Intake 1258.39 ml  Output 1600 ml  Net -341.61 ml    General:  Well appearing. No respiratory difficulty HEENT: normal Neck: supple. no JVD. Carotids 2+ bilat; no bruits. No lymphadenopathy or thryomegaly appreciated. Cor: PMI nondisplaced. Regular rate & rhythm. No rubs, gallops or murmurs. Lungs: clear Abdomen: soft, nontender, nondistended. No hepatosplenomegaly. No bruits or masses. Good bowel sounds. Extremities: no cyanosis, clubbing, rash, edema Neuro: alert & oriented x 3, cranial nerves grossly intact. moves all 4 extremities w/o difficulty. Affect pleasant.  ECG: Normal sinus rhythm with T wave inversion in the inferior leads and possible old inferior wall myocardial infarction and nonspecific ST-T changes  Results for orders placed or performed during the hospital encounter of 05/19/21 (from the past 24 hour(s))  Basic metabolic panel     Status: Abnormal   Collection Time: 05/19/21  3:06 PM  Result Value Ref Range   Sodium 126 (L) 135 - 145 mmol/L   Potassium 4.4 3.5 - 5.1 mmol/L   Chloride 93 (L) 98 - 111 mmol/L   CO2 23 22 - 32 mmol/L   Glucose, Bld 97 70 - 99 mg/dL   BUN 6 (L) 8 - 23 mg/dL   Creatinine, Ser 0.53 (L) 0.61 - 1.24 mg/dL   Calcium 9.1 8.9 - 10.3 mg/dL   GFR, Estimated >60 >60 mL/min   Anion gap 10 5 - 15  CBC     Status: Abnormal   Collection Time: 05/19/21  3:06 PM   Result Value Ref Range   WBC 13.8 (H) 4.0 - 10.5 K/uL   RBC 4.44 4.22 - 5.81 MIL/uL   Hemoglobin 12.5 (L) 13.0 - 17.0 g/dL   HCT 35.5 (L) 39.0 - 52.0 %   MCV 80.0 80.0 - 100.0 fL   MCH 28.2 26.0 - 34.0 pg   MCHC 35.2 30.0 - 36.0 g/dL   RDW 16.9 (H) 11.5 - 15.5 %   Platelets 412 (H) 150 - 400 K/uL   nRBC 0.0 0.0 - 0.2 %  Troponin I (High Sensitivity)     Status: None   Collection Time: 05/19/21  3:06 PM  Result Value Ref Range   Troponin I (High Sensitivity) 5 <18 ng/L  Hepatic function panel     Status: Abnormal   Collection Time: 05/19/21  3:21 PM  Result Value Ref Range   Total Protein 7.2 6.5 - 8.1 g/dL   Albumin 3.7 3.5 - 5.0 g/dL   AST 50 (H) 15 - 41 U/L   ALT 31 0 - 44 U/L   Alkaline Phosphatase 57 38 - 126 U/L   Total Bilirubin 0.5 0.3 - 1.2 mg/dL   Bilirubin, Direct <0.1 0.0 - 0.2 mg/dL   Indirect Bilirubin NOT CALCULATED 0.3 - 0.9 mg/dL  Magnesium     Status: None   Collection Time: 05/19/21  3:21 PM  Result Value Ref Range   Magnesium 1.7 1.7 - 2.4 mg/dL  Resp Panel by RT-PCR (Flu A&B, Covid) Nasopharyngeal Swab     Status: None   Collection Time: 05/19/21  4:09 PM   Specimen: Nasopharyngeal Swab; Nasopharyngeal(NP) swabs in vial transport medium  Result Value Ref Range   SARS Coronavirus 2 by RT PCR NEGATIVE NEGATIVE   Influenza A by PCR NEGATIVE NEGATIVE   Influenza B by PCR NEGATIVE NEGATIVE  TSH     Status: None   Collection Time: 05/19/21  4:11 PM  Result Value Ref Range   TSH 1.963 0.350 - 4.500 uIU/mL  D-dimer, quantitative     Status: Abnormal   Collection Time: 05/19/21  4:11 PM  Result Value Ref Range   D-Dimer, Quant 3.85 (H) 0.00 - 0.50 ug/mL-FEU  Troponin I (High Sensitivity)     Status: None   Collection Time: 05/19/21  6:31 PM  Result Value Ref Range   Troponin I (High Sensitivity) 6 <18 ng/L  Magnesium     Status: None   Collection Time: 05/19/21  6:31 PM  Result Value Ref Range   Magnesium 1.7 1.7 - 2.4  mg/dL  Urinalysis, Complete w  Microscopic Urine, Clean Catch     Status: Abnormal   Collection Time: 05/19/21  8:48 PM  Result Value Ref Range   Color, Urine STRAW (A) YELLOW   APPearance CLEAR (A) CLEAR   Specific Gravity, Urine 1.017 1.005 - 1.030   pH 7.0 5.0 - 8.0   Glucose, UA NEGATIVE NEGATIVE mg/dL   Hgb urine dipstick NEGATIVE NEGATIVE   Bilirubin Urine NEGATIVE NEGATIVE   Ketones, ur 5 (A) NEGATIVE mg/dL   Protein, ur NEGATIVE NEGATIVE mg/dL   Nitrite NEGATIVE NEGATIVE   Leukocytes,Ua NEGATIVE NEGATIVE   RBC / HPF 0-5 0 - 5 RBC/hpf   WBC, UA 0-5 0 - 5 WBC/hpf   Bacteria, UA NONE SEEN NONE SEEN   Squamous Epithelial / LPF NONE SEEN 0 - 5  Urine Drug Screen, Qualitative ([facility] only)     Status: None   Collection Time: 05/19/21  8:48 PM  Result Value Ref Range   Tricyclic, Ur Screen NONE DETECTED NONE DETECTED   Amphetamines, Ur Screen NONE DETECTED NONE DETECTED   MDMA (Ecstasy)Ur Screen NONE DETECTED NONE DETECTED   Cocaine Metabolite,Ur [place] NONE DETECTED NONE DETECTED   Opiate, Ur Screen NONE DETECTED NONE DETECTED   Phencyclidine (PCP) Ur S NONE DETECTED NONE DETECTED   Cannabinoid 50 Ng, Ur [place] NONE DETECTED NONE DETECTED   Barbiturates, Ur Screen NONE DETECTED NONE DETECTED   Benzodiazepine, Ur Scrn NONE DETECTED NONE DETECTED   Methadone Scn, Ur NONE DETECTED NONE DETECTED  Sodium, urine, random     Status: None   Collection Time: 05/19/21  8:48 PM  Result Value Ref Range   Sodium, Ur 26 mmol/L  Osmolality, urine     Status: Abnormal   Collection Time: 05/19/21  8:48 PM  Result Value Ref Range   Osmolality, Ur 170 (L) 300 - 900 mOsm/kg  Osmolality     Status: Abnormal   Collection Time: 05/20/21  1:30 AM  Result Value Ref Range   Osmolality 269 (L) 275 - 295 mOsm/kg  Ethanol     Status: None   Collection Time: 05/20/21  1:30 AM  Result Value Ref Range   Alcohol, Ethyl (B) <10 <10 mg/dL  Magnesium     Status: None   Collection Time: 05/20/21  4:18 AM  Result Value Ref Range   Magnesium  2.4 1.7 - 2.4 mg/dL  Comprehensive metabolic panel     Status: Abnormal   Collection Time: 05/20/21  4:18 AM  Result Value Ref Range   Sodium 130 (L) 135 - 145 mmol/L   Potassium 4.3 3.5 - 5.1 mmol/L   Chloride 96 (L) 98 - 111 mmol/L   CO2 25 22 - 32 mmol/L   Glucose, Bld 74 70 - 99 mg/dL   BUN 6 (L) 8 - 23 mg/dL   Creatinine, Ser 0.50 (L) 0.61 - 1.24 mg/dL   Calcium 8.6 (L) 8.9 - 10.3 mg/dL   Total Protein 6.6 6.5 - 8.1 g/dL   Albumin 3.3 (L) 3.5 - 5.0 g/dL   AST 38 15 - 41 U/L   ALT 27 0 - 44 U/L   Alkaline Phosphatase 52 38 - 126 U/L   Total Bilirubin 0.9 0.3 - 1.2 mg/dL   GFR, Estimated >60 >60 mL/min   Anion gap 9 5 - 15  CBC     Status: Abnormal   Collection Time: 05/20/21  4:18 AM  Result Value Ref Range   WBC 9.8 4.0 - 10.5 K/uL  RBC 4.36 4.22 - 5.81 MIL/uL   Hemoglobin 12.2 (L) 13.0 - 17.0 g/dL   HCT 35.1 (L) 39.0 - 52.0 %   MCV 80.5 80.0 - 100.0 fL   MCH 28.0 26.0 - 34.0 pg   MCHC 34.8 30.0 - 36.0 g/dL   RDW 17.2 (H) 11.5 - 15.5 %   Platelets 409 (H) 150 - 400 K/uL   nRBC 0.0 0.0 - 0.2 %  Troponin I (High Sensitivity)     Status: None   Collection Time: 05/20/21  4:18 AM  Result Value Ref Range   Troponin I (High Sensitivity) 7 <18 ng/L   DG Chest 2 View  Result Date: 05/19/2021 CLINICAL DATA:  Chest pain EXAM: CHEST - 2 VIEW COMPARISON:  October 03, 2016. FINDINGS: Lungs are hyperinflated with flattening of RIGHT and LEFT hemidiaphragm. No sign of consolidation, pleural effusion or pneumothorax. Cardiomediastinal contours and hilar structures are normal. Signs of prior skeletal trauma about the LEFT hemithorax, signs of ORIF the LEFT clavicle with lucency about the lateral aspect of the plate along the under surface of the acromion and lucency about the lateral screw of the clavicular plate. IMPRESSION: 1. No acute cardiopulmonary disease. 2. Hyperinflation, correlate with any signs of COPD. 3. Signs of prior skeletal trauma about the LEFT hemithorax with signs of  hardware loosening. 4. Lucency about hardware in the LEFT distal clavicle. May reflect hardware loosening. Correlate with any signs of infection. Electronically Signed   By: Zetta Bills M.D.   On: 05/19/2021 16:11   DG Shoulder Right  Result Date: 05/19/2021 CLINICAL DATA:  Syncope. EXAM: RIGHT SHOULDER - 2+ VIEW COMPARISON:  None FINDINGS: Shoulder is located without sign of fracture. Soft tissues are unremarkable. High riding humeral head. IMPRESSION: 1. No sign of fracture or dislocation. 2. High riding humeral head suggesting rotator cuff injury. Electronically Signed   By: Zetta Bills M.D.   On: 05/19/2021 16:17   CT Head Wo Contrast  Result Date: 05/19/2021 CLINICAL DATA:  Fall EXAM: CT HEAD WITHOUT CONTRAST CT CERVICAL SPINE WITHOUT CONTRAST TECHNIQUE: Multidetector CT imaging of the head and cervical spine was performed following the standard protocol without intravenous contrast. Multiplanar CT image reconstructions of the cervical spine were also generated. COMPARISON:  None. FINDINGS: CT HEAD FINDINGS Brain: No evidence of acute infarction, hemorrhage, hydrocephalus, extra-axial collection or mass lesion/mass effect. Periventricular white matter hypodensity. Vascular: No hyperdense vessel or unexpected calcification. Skull: Normal. Negative for fracture or focal lesion. Sinuses/Orbits: No acute finding. Other: Dense metallic foreign body in the right parietal scalp (series 3, image 46). CT CERVICAL SPINE FINDINGS Alignment: Degenerative straightening and reversal of the normal cervical lordosis. Skull base and vertebrae: No acute fracture. No primary bone lesion or focal pathologic process. Soft tissues and spinal canal: No prevertebral fluid or swelling. No visible canal hematoma. Disc levels: Severe disc degenerative disease and osteophytosis at C4 through C7. Upper chest: Negative. Other: None. IMPRESSION: 1. No acute intracranial pathology. Small-vessel white matter disease. 2. No  fracture or static subluxation of the cervical spine. 3. Severe multilevel cervical disc degenerative disease. Electronically Signed   By: Eddie Candle M.D.   On: 05/19/2021 17:18   CT Head Wo Contrast  Result Date: 05/19/2021 CLINICAL DATA:  Syncope and fall. EXAM: CT HEAD WITHOUT CONTRAST CT MAXILLOFACIAL WITHOUT CONTRAST CT CERVICAL SPINE WITHOUT CONTRAST TECHNIQUE: Multidetector CT imaging of the head, cervical spine, and maxillofacial structures were performed using the standard protocol without intravenous contrast. Multiplanar CT image reconstructions  of the cervical spine and maxillofacial structures were also generated. COMPARISON:  None. FINDINGS: CT HEAD FINDINGS Brain: No evidence of acute infarction, hemorrhage, hydrocephalus, extra-axial collection or mass lesion/mass effect. Mild generalized cerebral atrophy. Scattered mild periventricular and subcortical white matter hypodensities are nonspecific, but favored to reflect chronic microvascular ischemic changes. Vascular: No hyperdense vessel or unexpected calcification. Skull: Normal. Negative for fracture or focal lesion. Other: Small radiopaque density in the right parietal scalp. CT MAXILLOFACIAL FINDINGS Osseous: No fracture or mandibular dislocation.  Poor dentition. Orbits: Negative. No traumatic or inflammatory finding. 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 Frothy secretions in the right maxillary sinus. Partial opacification of the ethmoid air cells. The mastoid air cells are clear. Soft tissues: Mild left facial soft tissue swelling. CT CERVICAL SPINE FINDINGS Alignment: No traumatic malalignment. Reversal of the normal cervical lordosis. Trace anterolisthesis at C2-C3, C3-C4, and C7-T1. Trace retrolisthesis at C6-C7. Skull base and vertebrae: No acute fracture. No primary bone lesion or focal pathologic process. Soft tissues and spinal canal: No prevertebral fluid or swelling. No  visible canal hematoma. Disc levels: Moderate to severe disc height loss from C4-C5 through C6-C7 with right greater than left uncovertebral hypertrophy. Multilevel facet arthropathy. Upper chest: Mild emphysema. Other: None. IMPRESSION: CT head: 1. No acute intracranial abnormality. Mild cerebral atrophy and chronic microvascular ischemic changes. CT maxillofacial: 1. No acute maxillofacial fracture. Mild left facial soft tissue swelling. 2. 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 CT cervical spine: 1. No acute cervical spine fracture or traumatic listhesis. Moderate to severe degenerative changes of the cervical spine. 2. Emphysema (ICD10-J43.9). Electronically Signed   By: Titus Dubin M.D.   On: 05/19/2021 15:41   CT Angio Chest PE W and/or Wo Contrast  Result Date: 05/19/2021 CLINICAL DATA:  PE suspected EXAM: CT ANGIOGRAPHY CHEST WITH CONTRAST TECHNIQUE: Multidetector CT imaging of the chest was performed using the standard protocol during bolus administration of intravenous contrast. Multiplanar CT image reconstructions and MIPs were obtained to evaluate the vascular anatomy. CONTRAST:  28mL OMNIPAQUE IOHEXOL 350 MG/ML SOLN COMPARISON:  None. FINDINGS: Cardiovascular: Satisfactory opacification of the pulmonary arteries to the segmental level. No evidence of pulmonary embolism. Normal heart size. Three-vessel coronary artery calcifications. No pericardial effusion. Aortic atherosclerosis. Mediastinum/Nodes: No enlarged mediastinal, hilar, or axillary lymph nodes. Thyroid gland, trachea, and esophagus demonstrate no significant findings. Lungs/Pleura: Mild centrilobular emphysema. No pleural effusion or pneumothorax. Upper Abdomen: No acute abnormality. Musculoskeletal: No chest wall abnormality. No acute or significant osseous findings. Review of the MIP images confirms the above findings. IMPRESSION: 1. Negative examination for pulmonary embolism. 2.  Emphysema. 3. Coronary artery disease. Aortic Atherosclerosis (ICD10-I70.0) and Emphysema (ICD10-J43.9). Electronically Signed   By: Eddie Candle M.D.   On: 05/19/2021 17:27   CT Cervical Spine Wo Contrast  Result Date: 05/19/2021 CLINICAL DATA:  Fall EXAM: CT HEAD WITHOUT CONTRAST CT CERVICAL SPINE WITHOUT CONTRAST TECHNIQUE: Multidetector CT imaging of the head and cervical spine was performed following the standard protocol without intravenous contrast. Multiplanar CT image reconstructions of the cervical spine were also generated. COMPARISON:  None. FINDINGS: CT HEAD FINDINGS Brain: No evidence of acute infarction, hemorrhage, hydrocephalus, extra-axial collection or mass lesion/mass effect. Periventricular white matter hypodensity. Vascular: No hyperdense vessel or unexpected calcification. Skull: Normal. Negative for fracture or focal lesion. Sinuses/Orbits: No acute finding. Other: Dense metallic foreign body in the right parietal scalp (series 3, image 46). CT CERVICAL SPINE FINDINGS  Alignment: Degenerative straightening and reversal of the normal cervical lordosis. Skull base and vertebrae: No acute fracture. No primary bone lesion or focal pathologic process. Soft tissues and spinal canal: No prevertebral fluid or swelling. No visible canal hematoma. Disc levels: Severe disc degenerative disease and osteophytosis at C4 through C7. Upper chest: Negative. Other: None. IMPRESSION: 1. No acute intracranial pathology. Small-vessel white matter disease. 2. No fracture or static subluxation of the cervical spine. 3. Severe multilevel cervical disc degenerative disease. Electronically Signed   By: Eddie Candle M.D.   On: 05/19/2021 17:18   CT Cervical Spine Wo Contrast  Result Date: 05/19/2021 CLINICAL DATA:  Syncope and fall. EXAM: CT HEAD WITHOUT CONTRAST CT MAXILLOFACIAL WITHOUT CONTRAST CT CERVICAL SPINE WITHOUT CONTRAST TECHNIQUE: Multidetector CT imaging of the head, cervical spine, and maxillofacial  structures were performed using the standard protocol without intravenous contrast. Multiplanar CT image reconstructions of the cervical spine and maxillofacial structures were also generated. COMPARISON:  None. FINDINGS: CT HEAD FINDINGS Brain: No evidence of acute infarction, hemorrhage, hydrocephalus, extra-axial collection or mass lesion/mass effect. Mild generalized cerebral atrophy. Scattered mild periventricular and subcortical white matter hypodensities are nonspecific, but favored to reflect chronic microvascular ischemic changes. Vascular: No hyperdense vessel or unexpected calcification. Skull: Normal. Negative for fracture or focal lesion. Other: Small radiopaque density in the right parietal scalp. CT MAXILLOFACIAL FINDINGS Osseous: No fracture or mandibular dislocation.  Poor dentition. Orbits: Negative. No traumatic or inflammatory finding. 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 Frothy secretions in the right maxillary sinus. Partial opacification of the ethmoid air cells. The mastoid air cells are clear. Soft tissues: Mild left facial soft tissue swelling. CT CERVICAL SPINE FINDINGS Alignment: No traumatic malalignment. Reversal of the normal cervical lordosis. Trace anterolisthesis at C2-C3, C3-C4, and C7-T1. Trace retrolisthesis at C6-C7. Skull base and vertebrae: No acute fracture. No primary bone lesion or focal pathologic process. Soft tissues and spinal canal: No prevertebral fluid or swelling. No visible canal hematoma. Disc levels: Moderate to severe disc height loss from C4-C5 through C6-C7 with right greater than left uncovertebral hypertrophy. Multilevel facet arthropathy. Upper chest: Mild emphysema. Other: None. IMPRESSION: CT head: 1. No acute intracranial abnormality. Mild cerebral atrophy and chronic microvascular ischemic changes. CT maxillofacial: 1. No acute maxillofacial fracture. Mild left facial soft tissue swelling. 2. Chronic  left maxillary sinusitis with a mucocele extending into the nasal cavity. CT cervical spine: 1. No acute cervical spine fracture or traumatic listhesis. Moderate to severe degenerative changes of the cervical spine. 2. Emphysema (ICD10-J43.9). Electronically Signed   By: Titus Dubin M.D.   On: 05/19/2021 15:41   CT MAXILLOFACIAL WO CONTRAST  Result Date: 05/19/2021 CLINICAL DATA:  Syncope and fall. EXAM: CT HEAD WITHOUT CONTRAST CT MAXILLOFACIAL WITHOUT CONTRAST CT CERVICAL SPINE WITHOUT CONTRAST TECHNIQUE: Multidetector CT imaging of the head, cervical spine, and maxillofacial structures were performed using the standard protocol without intravenous contrast. Multiplanar CT image reconstructions of the cervical spine and maxillofacial structures were also generated. COMPARISON:  None. FINDINGS: CT HEAD FINDINGS Brain: No evidence of acute infarction, hemorrhage, hydrocephalus, extra-axial collection or mass lesion/mass effect. Mild generalized cerebral atrophy. Scattered mild periventricular and subcortical white matter hypodensities are nonspecific, but favored to reflect chronic microvascular ischemic changes. Vascular: No hyperdense vessel or unexpected calcification. Skull: Normal. Negative for fracture or focal lesion. Other: Small radiopaque density in the right parietal scalp. CT MAXILLOFACIAL FINDINGS Osseous: No fracture or mandibular dislocation.  Poor dentition. Orbits: Negative.  No traumatic or inflammatory finding. 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 Frothy secretions in the right maxillary sinus. Partial opacification of the ethmoid air cells. The mastoid air cells are clear. Soft tissues: Mild left facial soft tissue swelling. CT CERVICAL SPINE FINDINGS Alignment: No traumatic malalignment. Reversal of the normal cervical lordosis. Trace anterolisthesis at C2-C3, C3-C4, and C7-T1. Trace retrolisthesis at C6-C7. Skull base and vertebrae:  No acute fracture. No primary bone lesion or focal pathologic process. Soft tissues and spinal canal: No prevertebral fluid or swelling. No visible canal hematoma. Disc levels: Moderate to severe disc height loss from C4-C5 through C6-C7 with right greater than left uncovertebral hypertrophy. Multilevel facet arthropathy. Upper chest: Mild emphysema. Other: None. IMPRESSION: CT head: 1. No acute intracranial abnormality. Mild cerebral atrophy and chronic microvascular ischemic changes. CT maxillofacial: 1. No acute maxillofacial fracture. Mild left facial soft tissue swelling. 2. 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 CT cervical spine: 1. No acute cervical spine fracture or traumatic listhesis. Moderate to severe degenerative changes of the cervical spine. 2. Emphysema (ICD10-J43.9). Electronically Signed   By: Titus Dubin M.D.   On: 05/19/2021 15:41     ASSESSMENT AND PLAN: Syncope associated with anginal type chest pain ruled out for myocardial infarction but abnormal EKG and smoker have to rule out coronary artery disease.  Patient is not reliable he was scheduled for a stress test before and because of cost reason and some other reasons did not have it done.  Since patient has syncope associated with chest pain and is high risk for coronary artery disease will proceed with cardiac catheterization.  Risk and benefits were explained and patient has agreed to the procedure and will be scheduled today.  Avalina Benko A con

## 2021-05-20 NOTE — Discharge Summary (Signed)
Kyle Larson EXN:[id number] DOB: 11-Jan-1956 DOA: 05/19/2021  PCP: [name], MD  Admit date: 05/19/2021 Discharge date: 05/20/2021  Time spent: 35 minutes  Recommendations for Outpatient Follow-up:  F/u with cardiology in 3 days Establish with pcp Check of serum sodium at f/u      Discharge Diagnoses:  Principal Problem:   Syncope Active Problems:   Chest pain   Chronic hyponatremia   Leukocytosis   Tobacco abuse   Alcohol abuse   Discharge Condition: stable  Diet recommendation: heart healthy  Filed Weights   05/20/21 0512 05/20/21 0938 05/20/21 1521  Weight: 52.3 kg 61.1 kg 65.9 kg    History of present illness:  Kyle Larson is a 65 y.o. male with medical history significant for tobacco abuse, who is admitted to [facility] on 05/19/2021 with syncope after presenting from home to [facility] ED complaining of episode of loss of consciousness.    The patient reports that he was pumping gas at a local gas station earlier this afternoon when, without warning, he lost consciousness, falling to the concrete below and hitting his head as a component of this.  He emphasizes that he had already lost consciousness by the time he hit the concrete, as he has no recollection of preceding dizziness or the act of falling to the ground.  This was witnessed by a good Samaritan at the gas station.  Additionally, his wife was present with him at the gas station, although within the associated convenience store at the time of the patient's episode of loss of consciousness.  She was in the store for only 2 to 3 minutes before returning to their car and finding the patient being held back to his feet.  In the minutes leading up to this episode of loss of consciousness, The patient denies any chest pain, palpitations, diaphoresis, nausea, vomiting, dizziness, presyncope.  Leading up to this episode, he also denies any recent trauma or travel.  Episode was not associated with  any tongue biting or loss of bowel/bladder function, nor was it associated with any generalized tonic-clonic activity.  The patient denies any recent new onset edema in the lower extremities, new onset erythema in the lower extremities, or recent calf tenderness.  Denies any associated acute focal neurologic deficits, including no associated acute focal weakness, acute focal numbness, paresthesias, slurred speech, facial droop, acute change in vision, dysphagia, or vertigo.   Over the course of the last 2 weeks leading up to this syncopal episode, the patient reports that he has been experiencing new onset chest pain over that time.  He notes experiencing intermittent substernal chest pressure without radiation that worsens with exertion and improves with rest.  These episodes are associated with mild shortness of breath in the absence of any palpitations, diaphoresis, dizziness, presyncope or nausea/vomiting.  He has not taken any sublingual nitroglycerin while experiencing these episodes.  Episodes typically last for less than 5 minutes before spontaneously resolving.  They are nonpositional, nonpleuritic, and not reproducible with replication of the anterior chest wall.  He does not believe that they are occurring with increasing frequency over the last 2 weeks, or increasing intensity over that time.  Most recent episode occurred on the morning of 05/19/2021 several hours before his presenting episode of syncope.  Denies any current chest pain, nor any chest pain since he has presented to the ED today.  Denies any recent hemoptysis, orthopnea, PND.  Patient reports that he was recommended to pursue a stress test as  an outpatient in February 2022, but conveys that he did not subsequently pursue this recommendation due to associated financial concerns.  Denies any previous coronary ischemic evaluation, including no prior stress test or coronary angiography.  He acknowledges that he is a current smoker, but denies  any known underlying history of hypertension, hyperlipidemia, or diabetes.  Denies any use of recreational drugs.   Denies any subjective fever, chills, rigors, or generalized myalgias. Denies any recent headache, neck stiffness, rhinitis, rhinorrhea, sore throat, new wheezing, cough, nausea, vomiting, abdominal pain, diarrhea, or rash. No recent traveling or known COVID-19 exposures. Denies dysuria, gross hematuria, or change in urinary urgency/frequency.  Hospital Course:  Patient presented with syncope and chest pain. Evaluation included left heart cath which showed no significant CAD, CTA of chest which showed no PE (did show emphysematous changes), TTE (no grossly abnormal findings but did show lvh and grade 1 dd), carotid dopplers (no sig abnormalities), cardiac monitoring (no significant abnormalities), and lab testing (chronic hyponatremia). Patient does endorse heavy alcohol use which is the likely cause of his chronic hyponatremia. He will f/u with cardiology in 2 days, with consideration of placement of cardiac monitor. He was counseled in reducing alcohol consumption and in smoking cessation; we also discussed adequate hydration as dehydration likely contributed to his syncope. He was also referred to establish with a PCP.  Procedures Left heart cath Ost LAD to Prox LAD lesion is 30% stenosed.   No significant CAD, normal LVEF  Consultations: cardiology  Discharge Exam: Vitals:   05/20/21 1430 05/20/21 1521  BP: (!) 153/89 (!) 149/94  Pulse:  89  Resp: (!) 26   Temp:  (!) 97.2 F (36.2 C)  SpO2:  99%    General exam: Appears calm and comfortable Respiratory system: scatt rhonchi Cardiovascular system: S1 & S2 heard, RRR. Soft systolic murmur Gastrointestinal system: Abdomen is nondistended, soft and nontender. No organomegaly or masses felt. Normal bowel sounds heard. Central nervous system: Alert and oriented. No focal neurological deficits. Extremities: Symmetric 5 x 5  power. Skin: No rashes, lesions or ulcers Psychiatry: Judgement and insight appear normal. Mood & affect appropriate.  Discharge Instructions   Discharge Instructions     Diet - low sodium heart healthy   Complete by: As directed    Increase activity slowly   Complete by: As directed       Allergies as of 05/20/2021   No Known Allergies      Medication List     STOP taking these medications    Advil 200 MG tablet Generic drug: ibuprofen   multivitamin tablet   oxyCODONE 5 MG immediate release tablet Commonly known as: Roxicodone       No Known Allergies  Follow-up Information     [name], MD Follow up on 05/23/2021.   Specialty: Cardiology Why: at 9:00 AM. Please call to confirm appointment Contact information: [place] [address] [phone]         [facility] [place] Follow up.   Specialty: General Practice Why: call to establish care Contact information: [address] [place] [address] [phone]                  The results of significant diagnostics from this hospitalization (including imaging, microbiology, ancillary and laboratory) are listed below for reference.    Significant Diagnostic Studies: DG Chest 2 View  Result Date: 05/19/2021 CLINICAL DATA:  Chest pain EXAM: CHEST - 2 VIEW COMPARISON:  October 03, 2016. FINDINGS: Lungs are hyperinflated with flattening of RIGHT and LEFT hemidiaphragm. No sign of consolidation, pleural effusion or pneumothorax. Cardiomediastinal contours and hilar structures are normal. Signs of prior skeletal trauma about the LEFT hemithorax, signs of ORIF the LEFT clavicle with lucency about the lateral aspect of the plate along the under surface of the acromion and lucency about the lateral screw of the clavicular plate. IMPRESSION: 1. No acute cardiopulmonary disease. 2. Hyperinflation, correlate with any signs of COPD. 3. Signs of prior  skeletal trauma about the LEFT hemithorax with signs of hardware loosening. 4. Lucency about hardware in the LEFT distal clavicle. May reflect hardware loosening. Correlate with any signs of infection. Electronically Signed   By: Zetta Bills M.D.   On: 05/19/2021 16:11   DG Shoulder Right  Result Date: 05/19/2021 CLINICAL DATA:  Syncope. EXAM: RIGHT SHOULDER - 2+ VIEW COMPARISON:  None FINDINGS: Shoulder is located without sign of fracture. Soft tissues are unremarkable. High riding humeral head. IMPRESSION: 1. No sign of fracture or dislocation. 2. High riding humeral head suggesting rotator cuff injury. Electronically Signed   By: Zetta Bills M.D.   On: 05/19/2021 16:17   CT Head Wo Contrast  Result Date: 05/19/2021 CLINICAL DATA:  Fall EXAM: CT HEAD WITHOUT CONTRAST CT CERVICAL SPINE WITHOUT CONTRAST TECHNIQUE: Multidetector CT imaging of the head and cervical spine was performed following the standard protocol without intravenous contrast. Multiplanar CT image reconstructions of the cervical spine were also generated. COMPARISON:  None. FINDINGS: CT HEAD FINDINGS Brain: No evidence of acute infarction, hemorrhage, hydrocephalus, extra-axial collection or mass lesion/mass effect. Periventricular white matter hypodensity. Vascular: No hyperdense vessel or unexpected calcification. Skull: Normal. Negative for fracture or focal lesion. Sinuses/Orbits: No acute finding. Other: Dense metallic foreign body in the right parietal scalp (series 3, image 46). CT CERVICAL SPINE FINDINGS Alignment: Degenerative straightening and reversal of the normal cervical lordosis. Skull base and vertebrae: No acute fracture. No primary bone lesion or focal pathologic process. Soft tissues and spinal canal: No prevertebral fluid or swelling. No visible canal hematoma. Disc levels: Severe disc degenerative disease and osteophytosis at C4 through C7. Upper chest: Negative. Other: None. IMPRESSION: 1. No acute intracranial  pathology. Small-vessel white matter disease. 2. No fracture or static subluxation of the cervical spine. 3. Severe multilevel cervical disc degenerative disease. Electronically Signed   By: Eddie Candle M.D.   On: 05/19/2021 17:18   CT Head Wo Contrast  Result Date: 05/19/2021 CLINICAL DATA:  Syncope and fall. EXAM: CT HEAD WITHOUT CONTRAST CT MAXILLOFACIAL WITHOUT CONTRAST CT CERVICAL SPINE WITHOUT CONTRAST TECHNIQUE: Multidetector CT imaging of the head, cervical spine, and maxillofacial structures were performed using the standard protocol without intravenous contrast. Multiplanar CT image reconstructions of the cervical spine and maxillofacial structures were also generated. COMPARISON:  None. FINDINGS: CT HEAD FINDINGS Brain: No evidence of acute infarction, hemorrhage, hydrocephalus, extra-axial collection or mass lesion/mass effect. Mild generalized cerebral atrophy. Scattered mild periventricular and subcortical white matter hypodensities are nonspecific, but favored to reflect chronic microvascular ischemic changes. Vascular: No hyperdense vessel or unexpected calcification. Skull: Normal. Negative for fracture or focal lesion. Other: Small radiopaque density in the right parietal scalp. CT MAXILLOFACIAL FINDINGS Osseous: No fracture or mandibular dislocation.  Poor dentition. Orbits: Negative. No traumatic or inflammatory finding. 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 Frothy secretions in the right maxillary sinus. Partial opacification of the ethmoid air cells. The mastoid air cells are clear.  Soft tissues: Mild left facial soft tissue swelling. CT CERVICAL SPINE FINDINGS Alignment: No traumatic malalignment. Reversal of the normal cervical lordosis. Trace anterolisthesis at C2-C3, C3-C4, and C7-T1. Trace retrolisthesis at C6-C7. Skull base and vertebrae: No acute fracture. No primary bone lesion or focal pathologic process. Soft tissues and spinal  canal: No prevertebral fluid or swelling. No visible canal hematoma. Disc levels: Moderate to severe disc height loss from C4-C5 through C6-C7 with right greater than left uncovertebral hypertrophy. Multilevel facet arthropathy. Upper chest: Mild emphysema. Other: None. IMPRESSION: CT head: 1. No acute intracranial abnormality. Mild cerebral atrophy and chronic microvascular ischemic changes. CT maxillofacial: 1. No acute maxillofacial fracture. Mild left facial soft tissue swelling. 2. 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 CT cervical spine: 1. No acute cervical spine fracture or traumatic listhesis. Moderate to severe degenerative changes of the cervical spine. 2. Emphysema (ICD10-J43.9). Electronically Signed   By: Titus Dubin M.D.   On: 05/19/2021 15:41   CT Angio Chest PE W and/or Wo Contrast  Result Date: 05/19/2021 CLINICAL DATA:  PE suspected EXAM: CT ANGIOGRAPHY CHEST WITH CONTRAST TECHNIQUE: Multidetector CT imaging of the chest was performed using the standard protocol during bolus administration of intravenous contrast. Multiplanar CT image reconstructions and MIPs were obtained to evaluate the vascular anatomy. CONTRAST:  81mL OMNIPAQUE IOHEXOL 350 MG/ML SOLN COMPARISON:  None. FINDINGS: Cardiovascular: Satisfactory opacification of the pulmonary arteries to the segmental level. No evidence of pulmonary embolism. Normal heart size. Three-vessel coronary artery calcifications. No pericardial effusion. Aortic atherosclerosis. Mediastinum/Nodes: No enlarged mediastinal, hilar, or axillary lymph nodes. Thyroid gland, trachea, and esophagus demonstrate no significant findings. Lungs/Pleura: Mild centrilobular emphysema. No pleural effusion or pneumothorax. Upper Abdomen: No acute abnormality. Musculoskeletal: No chest wall abnormality. No acute or significant osseous findings. Review of the MIP images confirms the above findings. IMPRESSION: 1. Negative  examination for pulmonary embolism. 2. Emphysema. 3. Coronary artery disease. Aortic Atherosclerosis (ICD10-I70.0) and Emphysema (ICD10-J43.9). Electronically Signed   By: Eddie Candle M.D.   On: 05/19/2021 17:27   CT Cervical Spine Wo Contrast  Result Date: 05/19/2021 CLINICAL DATA:  Fall EXAM: CT HEAD WITHOUT CONTRAST CT CERVICAL SPINE WITHOUT CONTRAST TECHNIQUE: Multidetector CT imaging of the head and cervical spine was performed following the standard protocol without intravenous contrast. Multiplanar CT image reconstructions of the cervical spine were also generated. COMPARISON:  None. FINDINGS: CT HEAD FINDINGS Brain: No evidence of acute infarction, hemorrhage, hydrocephalus, extra-axial collection or mass lesion/mass effect. Periventricular white matter hypodensity. Vascular: No hyperdense vessel or unexpected calcification. Skull: Normal. Negative for fracture or focal lesion. Sinuses/Orbits: No acute finding. Other: Dense metallic foreign body in the right parietal scalp (series 3, image 46). CT CERVICAL SPINE FINDINGS Alignment: Degenerative straightening and reversal of the normal cervical lordosis. Skull base and vertebrae: No acute fracture. No primary bone lesion or focal pathologic process. Soft tissues and spinal canal: No prevertebral fluid or swelling. No visible canal hematoma. Disc levels: Severe disc degenerative disease and osteophytosis at C4 through C7. Upper chest: Negative. Other: None. IMPRESSION: 1. No acute intracranial pathology. Small-vessel white matter disease. 2. No fracture or static subluxation of the cervical spine. 3. Severe multilevel cervical disc degenerative disease. Electronically Signed   By: Eddie Candle M.D.   On: 05/19/2021 17:18   CT Cervical Spine Wo Contrast  Result Date: 05/19/2021 CLINICAL DATA:  Syncope and fall. EXAM: CT HEAD WITHOUT CONTRAST CT MAXILLOFACIAL WITHOUT CONTRAST CT CERVICAL SPINE WITHOUT CONTRAST TECHNIQUE:  Multidetector CT imaging of the  head, cervical spine, and maxillofacial structures were performed using the standard protocol without intravenous contrast. Multiplanar CT image reconstructions of the cervical spine and maxillofacial structures were also generated. COMPARISON:  None. FINDINGS: CT HEAD FINDINGS Brain: No evidence of acute infarction, hemorrhage, hydrocephalus, extra-axial collection or mass lesion/mass effect. Mild generalized cerebral atrophy. Scattered mild periventricular and subcortical white matter hypodensities are nonspecific, but favored to reflect chronic microvascular ischemic changes. Vascular: No hyperdense vessel or unexpected calcification. Skull: Normal. Negative for fracture or focal lesion. Other: Small radiopaque density in the right parietal scalp. CT MAXILLOFACIAL FINDINGS Osseous: No fracture or mandibular dislocation.  Poor dentition. Orbits: Negative. No traumatic or inflammatory finding. 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 Frothy secretions in the right maxillary sinus. Partial opacification of the ethmoid air cells. The mastoid air cells are clear. Soft tissues: Mild left facial soft tissue swelling. CT CERVICAL SPINE FINDINGS Alignment: No traumatic malalignment. Reversal of the normal cervical lordosis. Trace anterolisthesis at C2-C3, C3-C4, and C7-T1. Trace retrolisthesis at C6-C7. Skull base and vertebrae: No acute fracture. No primary bone lesion or focal pathologic process. Soft tissues and spinal canal: No prevertebral fluid or swelling. No visible canal hematoma. Disc levels: Moderate to severe disc height loss from C4-C5 through C6-C7 with right greater than left uncovertebral hypertrophy. Multilevel facet arthropathy. Upper chest: Mild emphysema. Other: None. IMPRESSION: CT head: 1. No acute intracranial abnormality. Mild cerebral atrophy and chronic microvascular ischemic changes. CT maxillofacial: 1. No acute maxillofacial fracture. Mild left  facial soft tissue swelling. 2. 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 CT cervical spine: 1. No acute cervical spine fracture or traumatic listhesis. Moderate to severe degenerative changes of the cervical spine. 2. Emphysema (ICD10-J43.9). Electronically Signed   By: Titus Dubin M.D.   On: 05/19/2021 15:41   CARDIAC CATHETERIZATION  Result Date: 05/20/2021  Ost LAD to Prox LAD lesion is 30% stenosed.  No significant CAD, normal LVEF   US Carotid Bilateral  Result Date: 05/20/2021 CLINICAL DATA:  65 year old male with history of syncope. EXAM: BILATERAL CAROTID DUPLEX ULTRASOUND TECHNIQUE: Pearline Cables scale imaging, color Doppler and duplex ultrasound were performed of bilateral carotid and vertebral arteries in the neck. COMPARISON:  None. FINDINGS: Criteria: Quantification of carotid stenosis is based on velocity parameters that correlate the residual internal carotid diameter with NASCET-based stenosis levels, using the diameter of the distal internal carotid lumen as the denominator for stenosis measurement. The following velocity measurements were obtained: RIGHT ICA: Peak systolic velocity 85 cm/sec, End diastolic velocity 20 cm/sec CCA: Peak systolic velocity 56 cm/sec SYSTOLIC ICA/CCA RATIO:  1.5 ECA: Peak systolic velocity 60 cm/sec LEFT ICA: Peak systolic velocity 58 cm/sec, End diastolic velocity 26 cm/sec CCA: 44 cm/sec SYSTOLIC ICA/CCA RATIO:  1.3 ECA: 84 cm/sec RIGHT CAROTID ARTERY: No atherosclerotic plaque formation. No significant tortuosity. Normal low resistance waveforms. RIGHT VERTEBRAL ARTERY:  Antegrade flow. LEFT CAROTID ARTERY: No atherosclerotic plaque formation. No significant tortuosity. Normal low resistance waveforms. LEFT VERTEBRAL ARTERY:  Antegrade flow. Upper extremity non-invasive blood pressures: Not obtained. IMPRESSION: 1. Right carotid artery system: Patent without significant atherosclerotic plaque formation. 2. Left carotid  artery system: Patent without significant atherosclerotic plaque formation. 3.  Vertebral artery system: Patent with antegrade flow bilaterally. Ruthann Cancer, MD [facility] Electronically Signed   By: Ruthann Cancer MD   On: 05/20/2021 09:05   ECHOCARDIOGRAM COMPLETE  Result Date: 05/20/2021    ECHOCARDIOGRAM REPORT   Patient Name:   OBRIAN BULSON Date of Exam: 05/20/2021 Medical Rec #:  [id number]       Height:       72.0 in Accession #:    [id number]      Weight:       115.2 lb Date of Birth:  [birth year]       BSA:          1.685 m Patient Age:    54 years        BP:           130/80 mmHg Patient Gender: M               HR:           80 bpm. Exam Location:  [facility] Procedure: 2D Echo, Cardiac Doppler and Color Doppler Indications:     Chest pain R07.9  History:         Patient has no prior history of Echocardiogram examinations.                  Signs/Symptoms:Syncope. Tobacco abuse.  Sonographer:     Sherrie Sport RDCS (AE) Referring Phys:  [id number] Rhetta Mura Diagnosing Phys: Neoma Laming MD  Sonographer Comments: No parasternal window and Technically challenging study due to limited acoustic windows. IMPRESSIONS  1. Left ventricular ejection fraction, by estimation, is 60 to 65%. The left ventricle has normal function. The left ventricle has no regional wall motion abnormalities. There is mild concentric left ventricular hypertrophy. Left ventricular diastolic parameters are consistent with Grade I diastolic dysfunction (impaired relaxation).  2. Right ventricular systolic function is normal. The right ventricular size is normal.  3. The mitral valve is normal in structure. No evidence of mitral valve regurgitation. No evidence of mitral stenosis.  4. The aortic valve is normal in structure. Aortic valve regurgitation is not visualized. No aortic stenosis is present.  5. The inferior vena cava is normal in size with greater than 50% respiratory  variability, suggesting right atrial pressure of 3 mmHg. FINDINGS  Left Ventricle: Left ventricular ejection fraction, by estimation, is 60 to 65%. The left ventricle has normal function. The left ventricle has no regional wall motion abnormalities. The left ventricular internal cavity size was normal in size. There is  mild concentric left ventricular hypertrophy. Left ventricular diastolic parameters are consistent with Grade I diastolic dysfunction (impaired relaxation). Right Ventricle: The right ventricular size is normal. No increase in right ventricular wall thickness. Right ventricular systolic function is normal. Left Atrium: Left atrial size was normal in size. Right Atrium: Right atrial size was normal in size. Pericardium: There is no evidence of pericardial effusion. Mitral Valve: The mitral valve is normal in structure. No evidence of mitral valve regurgitation. No evidence of mitral valve stenosis. Tricuspid Valve: The tricuspid valve is normal in structure. Tricuspid valve regurgitation is not demonstrated. No evidence of tricuspid stenosis. Aortic Valve: The aortic valve is normal in structure. Aortic valve regurgitation is not visualized. No aortic stenosis is present. Aortic valve mean gradient measures 2.0 mmHg. Aortic valve peak gradient measures 3.4 mmHg. Pulmonic Valve: The pulmonic valve was normal in structure. Pulmonic valve regurgitation is not visualized. No evidence of pulmonic stenosis. Aorta: The aortic root is normal in size and structure. Venous: The inferior vena cava is normal in size with greater than 50% respiratory variability, suggesting right atrial pressure of 3 mmHg. IAS/Shunts: No atrial level shunt  detected by color flow Doppler.  LEFT VENTRICLE PLAX 2D LVIDd:         4.28 cm LVIDs:         2.98 cm LV PW:         1.00 cm LV IVS:        0.76 cm  RIGHT VENTRICLE RV Basal diam:  3.93 cm RV S prime:     12.00 cm/s TAPSE (M-mode): 2.8 cm LEFT ATRIUM           Index       RIGHT  ATRIUM           Index LA diam:      2.80 cm 1.66 cm/m  RA Area:     10.70 cm LA Vol (A2C): 16.8 ml 9.97 ml/m  RA Volume:   24.70 ml  14.66 ml/m LA Vol (A4C): 22.7 ml 13.47 ml/m  AORTIC VALVE                   PULMONIC VALVE AV Vmax:           91.80 cm/s  PV Vmax:        0.80 m/s AV Vmean:          70.000 cm/s PV Peak grad:   2.6 mmHg AV VTI:            0.132 m     RVOT Peak grad: 3 mmHg AV Peak Grad:      3.4 mmHg AV Mean Grad:      2.0 mmHg LVOT Vmax:         70.50 cm/s LVOT Vmean:        43.400 cm/s LVOT VTI:          0.106 m LVOT/AV VTI ratio: 0.80 MITRAL VALVE               TRICUSPID VALVE MV Area (PHT): 7.66 cm    TR Peak grad:   9.4 mmHg MV Decel Time: 99 msec     TR Vmax:        153.00 cm/s MV E velocity: 54.00 cm/s MV A velocity: 69.40 cm/s  SHUNTS MV E/A ratio:  0.78        Systemic VTI: 0.11 m Neoma Laming MD Electronically signed by Neoma Laming MD Signature Date/Time: 05/20/2021/12:28:43 PM    Final    CT MAXILLOFACIAL WO CONTRAST  Result Date: 05/19/2021 CLINICAL DATA:  Syncope and fall. EXAM: CT HEAD WITHOUT CONTRAST CT MAXILLOFACIAL WITHOUT CONTRAST CT CERVICAL SPINE WITHOUT CONTRAST TECHNIQUE: Multidetector CT imaging of the head, cervical spine, and maxillofacial structures were performed using the standard protocol without intravenous contrast. Multiplanar CT image reconstructions of the cervical spine and maxillofacial structures were also generated. COMPARISON:  None. FINDINGS: CT HEAD FINDINGS Brain: No evidence of acute infarction, hemorrhage, hydrocephalus, extra-axial collection or mass lesion/mass effect. Mild generalized cerebral atrophy. Scattered mild periventricular and subcortical white matter hypodensities are nonspecific, but favored to reflect chronic microvascular ischemic changes. Vascular: No hyperdense vessel or unexpected calcification. Skull: Normal. Negative for fracture or focal lesion. Other: Small radiopaque density in the right parietal scalp. CT MAXILLOFACIAL  FINDINGS Osseous: No fracture or mandibular dislocation.  Poor dentition. Orbits: Negative. No traumatic or inflammatory finding. 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 Frothy secretions in the right maxillary sinus. Partial opacification of the ethmoid air cells. The mastoid air cells are clear. Soft tissues: Mild left facial soft tissue swelling. CT  CERVICAL SPINE FINDINGS Alignment: No traumatic malalignment. Reversal of the normal cervical lordosis. Trace anterolisthesis at C2-C3, C3-C4, and C7-T1. Trace retrolisthesis at C6-C7. Skull base and vertebrae: No acute fracture. No primary bone lesion or focal pathologic process. Soft tissues and spinal canal: No prevertebral fluid or swelling. No visible canal hematoma. Disc levels: Moderate to severe disc height loss from C4-C5 through C6-C7 with right greater than left uncovertebral hypertrophy. Multilevel facet arthropathy. Upper chest: Mild emphysema. Other: None. IMPRESSION: CT head: 1. No acute intracranial abnormality. Mild cerebral atrophy and chronic microvascular ischemic changes. CT maxillofacial: 1. No acute maxillofacial fracture. Mild left facial soft tissue swelling. 2. 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 CT cervical spine: 1. No acute cervical spine fracture or traumatic listhesis. Moderate to severe degenerative changes of the cervical spine. 2. Emphysema (ICD10-J43.9). Electronically Signed   By: Titus Dubin M.D.   On: 05/19/2021 15:41    Microbiology: Recent Results (from the past 240 hour(s))  Resp Panel by RT-PCR (Flu A&B, Covid) Nasopharyngeal Swab     Status: None   Collection Time: 05/19/21  4:09 PM   Specimen: Nasopharyngeal Swab; Nasopharyngeal(NP) swabs in vial transport medium  Result Value Ref Range Status   SARS Coronavirus 2 by RT PCR NEGATIVE NEGATIVE Final    Comment: (NOTE) SARS-CoV-2 target nucleic acids are NOT  DETECTED.  The SARS-CoV-2 RNA is generally detectable in upper respiratory specimens during the acute phase of infection. The lowest concentration of SARS-CoV-2 viral copies this assay can detect is 138 copies/mL. A negative result does not preclude SARS-Cov-2 infection and should not be used as the sole basis for treatment or other patient management decisions. A negative result may occur with  improper specimen collection/handling, submission of specimen other than nasopharyngeal swab, presence of viral mutation(s) within the areas targeted by this assay, and inadequate number of viral copies(<138 copies/mL). A negative result must be combined with clinical observations, patient history, and epidemiological information. The expected result is Negative.  Fact Sheet for Patients:  EntrepreneurPulse.com.au  Fact Sheet for Healthcare Providers:  IncredibleEmployment.be  This test is no t yet approved or cleared by the [place] FDA and  has been authorized for detection and/or diagnosis of SARS-CoV-2 by FDA under an Emergency Use Authorization (EUA). This EUA will remain  in effect (meaning this test can be used) for the duration of the COVID-19 declaration under Section 564(b)(1) of the Act, 21 U.S.C.section 360bbb-3(b)(1), unless the authorization is terminated  or revoked sooner.       Influenza A by PCR NEGATIVE NEGATIVE Final   Influenza B by PCR NEGATIVE NEGATIVE Final    Comment: (NOTE) The Xpert Xpress SARS-CoV-2/FLU/RSV plus assay is intended as an aid in the diagnosis of influenza from Nasopharyngeal swab specimens and should not be used as a sole basis for treatment. Nasal washings and aspirates are unacceptable for Xpert Xpress SARS-CoV-2/FLU/RSV testing.  Fact Sheet for Patients: EntrepreneurPulse.com.au  Fact Sheet for Healthcare Providers: IncredibleEmployment.be  This test is not yet  approved or cleared by the [place] FDA and has been authorized for detection and/or diagnosis of SARS-CoV-2 by FDA under an Emergency Use Authorization (EUA). This EUA will remain in effect (meaning this test can be used) for the duration of the COVID-19 declaration under Section 564(b)(1) of the Act, 21 U.S.C. section 360bbb-3(b)(1), unless the authorization is terminated or revoked.  Performed at [facility], [address], [place], [place] [address]  Labs: Basic Metabolic Panel: Recent Labs  Lab 05/19/21 1506 05/19/21 1521 05/19/21 1831 05/20/21 0418 05/20/21 1001  NA 126*  --   --  130* 129*  K 4.4  --   --  4.3 3.8  CL 93*  --   --  96* 96*  CO2 23  --   --  25 26  GLUCOSE 97  --   --  74 103*  BUN 6*  --   --  6* 6*  CREATININE 0.53*  --   --  0.50* 0.56*  CALCIUM 9.1  --   --  8.6* 8.9  MG  --  1.7 1.7 2.4  --    Liver Function Tests: Recent Labs  Lab 05/19/21 1521 05/20/21 0418  AST 50* 38  ALT 31 27  ALKPHOS 57 52  BILITOT 0.5 0.9  PROT 7.2 6.6  ALBUMIN 3.7 3.3*   No results for input(s): LIPASE, AMYLASE in the last 168 hours. No results for input(s): AMMONIA in the last 168 hours. CBC: Recent Labs  Lab 05/19/21 1506 05/20/21 0418 05/20/21 1001  WBC 13.8* 9.8 10.1  HGB 12.5* 12.2* 12.6*  HCT 35.5* 35.1* 36.8*  MCV 80.0 80.5 81.1  PLT 412* 409* 409*   Cardiac Enzymes: No results for input(s): CKTOTAL, CKMB, CKMBINDEX, TROPONINI in the last 168 hours. BNP: BNP (last 3 results) No results for input(s): BNP in the last 8760 hours.  ProBNP (last 3 results) No results for input(s): PROBNP in the last 8760 hours.  CBG: No results for input(s): GLUCAP in the last 168 hours.     Signed:  Desma Maxim MD.  [facility] 05/20/2021, 4:35 PM

## 2021-05-21 LAB — HEMOGLOBIN A1C
Hgb A1c MFr Bld: 5.6 % (ref 4.8–5.6)
Mean Plasma Glucose: 114 mg/dL

## 2021-08-10 ENCOUNTER — Other Ambulatory Visit: Payer: Self-pay

## 2021-08-10 DIAGNOSIS — F17210 Nicotine dependence, cigarettes, uncomplicated: Secondary | ICD-10-CM | POA: Insufficient documentation

## 2021-08-10 DIAGNOSIS — E871 Hypo-osmolality and hyponatremia: Secondary | ICD-10-CM | POA: Insufficient documentation

## 2021-08-10 DIAGNOSIS — R339 Retention of urine, unspecified: Secondary | ICD-10-CM | POA: Insufficient documentation

## 2021-08-10 LAB — URINALYSIS, COMPLETE (UACMP) WITH MICROSCOPIC
Bilirubin Urine: NEGATIVE
Glucose, UA: NEGATIVE mg/dL
Ketones, ur: NEGATIVE mg/dL
Leukocytes,Ua: NEGATIVE
Nitrite: NEGATIVE
Protein, ur: NEGATIVE mg/dL
Specific Gravity, Urine: 1.01 (ref 1.005–1.030)
pH: 7 (ref 5.0–8.0)

## 2021-08-10 NOTE — ED Triage Notes (Signed)
First RN Note: pt to ED via POV, states increasing problems with his prostate over the last month and increasing swelling. Pt states "I haven't been able to pee more than a glass of water all day". Pt states feeling like a cantaloupe in his stomach due to pain. Bladder scan approx 470m in bladder.

## 2021-08-10 NOTE — ED Triage Notes (Signed)
Patient reports difficulty urinating over the past week.

## 2021-08-11 ENCOUNTER — Emergency Department
Admission: EM | Admit: 2021-08-11 | Discharge: 2021-08-11 | Disposition: A | Discharge status: Home / Self Care | Payer: Self-pay | Attending: Emergency Medicine | Admitting: Emergency Medicine

## 2021-08-11 DIAGNOSIS — R339 Retention of urine, unspecified: Secondary | ICD-10-CM

## 2021-08-11 DIAGNOSIS — E871 Hypo-osmolality and hyponatremia: Secondary | ICD-10-CM

## 2021-08-11 LAB — CBC WITH DIFFERENTIAL/PLATELET
Abs Immature Granulocytes: 0.07 10*3/uL (ref 0.00–0.07)
Basophils Absolute: 0.1 10*3/uL (ref 0.0–0.1)
Basophils Relative: 0 %
Eosinophils Absolute: 0.1 10*3/uL (ref 0.0–0.5)
Eosinophils Relative: 0 %
HCT: 34.6 % — ABNORMAL LOW (ref 39.0–52.0)
Hemoglobin: 11.7 g/dL — ABNORMAL LOW (ref 13.0–17.0)
Immature Granulocytes: 0 %
Lymphocytes Relative: 16 %
Lymphs Abs: 2.7 10*3/uL (ref 0.7–4.0)
MCH: 27.9 pg (ref 26.0–34.0)
MCHC: 33.8 g/dL (ref 30.0–36.0)
MCV: 82.4 fL (ref 80.0–100.0)
Monocytes Absolute: 1.8 10*3/uL — ABNORMAL HIGH (ref 0.1–1.0)
Monocytes Relative: 11 %
Neutro Abs: 11.6 10*3/uL — ABNORMAL HIGH (ref 1.7–7.7)
Neutrophils Relative %: 73 %
Platelets: 515 10*3/uL — ABNORMAL HIGH (ref 150–400)
RBC: 4.2 MIL/uL — ABNORMAL LOW (ref 4.22–5.81)
RDW: 18.2 % — ABNORMAL HIGH (ref 11.5–15.5)
WBC: 16.2 10*3/uL — ABNORMAL HIGH (ref 4.0–10.5)
nRBC: 0 % (ref 0.0–0.2)

## 2021-08-11 LAB — BASIC METABOLIC PANEL
Anion gap: 12 (ref 5–15)
BUN: 5 mg/dL — ABNORMAL LOW (ref 8–23)
CO2: 24 mmol/L (ref 22–32)
Calcium: 9.1 mg/dL (ref 8.9–10.3)
Chloride: 91 mmol/L — ABNORMAL LOW (ref 98–111)
Creatinine, Ser: 0.66 mg/dL (ref 0.61–1.24)
GFR, Estimated: >60 mL/min (ref >60)
Glucose, Bld: 117 mg/dL — ABNORMAL HIGH (ref 70–99)
Potassium: 3.8 mmol/L (ref 3.5–5.1)
Sodium: 127 mmol/L — ABNORMAL LOW (ref 135–145)

## 2021-08-11 MED ORDER — TAMSULOSIN HCL 0.4 MG PO CAPS
0.4000 mg | ORAL_CAPSULE | Freq: Every day | ORAL | 0 refills | Status: DC
Start: 2021-08-11 — End: 2021-11-26

## 2021-08-11 NOTE — ED Provider Notes (Signed)
[facility] Emergency Department Provider Note  ____________________________________________   Event Date/Time   First MD Initiated Contact with Patient 08/11/21 0119     (approximate)  I have reviewed the triage vital signs and the nursing notes.   HISTORY  Chief Complaint Urinary Retention    HPI Kyle Larson is a 65 y.o. male with no significant past medical history who presents to the emergency department with urinary retention.  States he has had difficulty emptying his bladder for the past week.  States he is at max able to urinate about 1 cup of urine at a time.  He states he will get up frequently and only able to get out a small amount with a poor stream.  No dysuria or hematuria.  No fever.  Was found to have approximately 500 mL on his bladder on arrival.  A Foley catheter was placed in triage and patient reports he is feeling much better.  He has never had a Foley catheter before.  States he has tried over-the-counter medications for his prostate without relief.  He does not have a urologist.        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/21/2016    Past Surgical History:  Procedure Laterality Date   ACROMIO-CLAVICULAR JOINT REPAIR Left 09/11/2016   Procedure: ACROMIO-CLAVICULAR RECONSTRUCTION;  Surgeon: Corky Mull, MD;  Location: [facility] ORS;  Service: Orthopedics;  Laterality: Left;  clavicle   LEFT HEART CATH AND CORONARY ANGIOGRAPHY N/A 05/20/2021   Procedure: LEFT HEART CATH AND CORONARY ANGIOGRAPHY with coronary intervention;  Surgeon: [name], MD;  Location: [facility];  Service: Cardiovascular;  Laterality: N/A;   MANDIBLE RECONSTRUCTION     TONSILLECTOMY     AGE 45   XI ROBOTIC ASSISTED INGUINAL HERNIA REPAIR WITH MESH Right 03/26/2020   Procedure: XI ROBOTIC ASSISTED INGUINAL HERNIA REPAIR WITH MESH;  Surgeon: Herbert Pun, MD;  Location: [facility] ORS;  Service: General;  Laterality: Right;    Prior to Admission medications   Medication Sig Start Date End Date Taking? Authorizing Provider  tamsulosin (FLOMAX) 0.4 MG CAPS capsule Take 1 capsule (0.4 mg total) by mouth daily. 08/11/21  Yes Marisabel Macpherson, Delice Bison, DO    Allergies Patient has no known allergies.  No family history on file.  Social History Social History   Tobacco Use   Smoking status: Every Day    Packs/day: 1.00    Years: 15.00    Pack years: 15.00    Types: Cigarettes   Smokeless tobacco: Never  Substance Use Topics   Alcohol use: Yes    Comment: OCC   Drug use: No    Review of Systems Constitutional: No fever. Eyes: No visual changes. ENT: No sore throat. Cardiovascular: Denies chest pain. Respiratory: Denies shortness of breath. Gastrointestinal: No nausea, vomiting, diarrhea. Genitourinary: Negative for dysuria. Musculoskeletal: Negative for back pain. Skin: Negative for rash. Neurological: Negative for focal weakness or numbness.  ____________________________________________   PHYSICAL EXAM:  VITAL SIGNS: ED Triage Vitals  Enc Vitals Group     BP 08/10/21 1955 122/85     Pulse Rate 08/10/21 1955 91     Resp 08/10/21 1955 (!) 26     Temp 08/10/21 1955 98.5 F (36.9 C)     Temp Source 08/10/21 1955 Oral     SpO2 08/10/21 1955 100 %     Weight 08/10/21 2001 145 lb (65.8 kg)     Height 08/10/21 2001 6' (1.829 m)     Head Circumference --      Peak Flow --      Pain Score --      Pain Loc --      Pain Edu? --      Excl. in [place]? --    CONSTITUTIONAL: Alert and oriented and responds appropriately to questions.  Well-appearing; well-nourished HEAD: Normocephalic EYES: Conjunctivae clear, pupils appear equal, EOM appear intact ENT: normal nose; moist mucous membranes NECK: Supple, normal ROM CARD: RRR; S1 and S2 appreciated; no murmurs, no clicks, no rubs, no gallops RESP: Normal chest excursion without splinting or tachypnea; breath sounds clear and equal bilaterally; no wheezes, no rhonchi, no rales, no hypoxia or respiratory distress, speaking full sentences ABD/GI: Normal bowel sounds; non-distended; soft, non-tender, no rebound, no guarding, no peritoneal signs, no hepatosplenomegaly BACK: The back appears normal EXT: Normal ROM in all joints; no deformity noted, no edema; no cyanosis SKIN: Normal color for age and race; warm; no rash on exposed skin NEURO: Moves all extremities equally PSYCH: The patient's mood and manner are appropriate.  ____________________________________________   LABS (all labs ordered are listed, but only abnormal results are displayed)  Labs Reviewed  URINALYSIS, COMPLETE (UACMP) WITH MICROSCOPIC - Abnormal; Notable for the following components:      Result Value   Hgb urine dipstick TRACE (*)    Bacteria, UA RARE (*)    All other components within normal limits  CBC WITH DIFFERENTIAL/PLATELET - Abnormal; Notable for the following components:   WBC 16.2 (*)    RBC 4.20 (*)    Hemoglobin 11.7 (*)    HCT 34.6 (*)    RDW 18.2 (*)    Platelets 515 (*)    Neutro Abs 11.6 (*)    Monocytes Absolute 1.8 (*)    All other components within normal limits  BASIC METABOLIC PANEL - Abnormal; Notable for the following components:   Sodium 127 (*)    Chloride 91 (*)    Glucose, Bld 117 (*)    BUN 5 (*)    All other components within normal limits   ____________________________________________  EKG   ____________________________________________  RADIOLOGY I, Zerenity Bowron, personally viewed and evaluated these images (plain radiographs) as part of my medical  decision making, as well as reviewing the written report by the radiologist.  ED MD interpretation:    Official radiology report(s): No results found.  ____________________________________________   PROCEDURES  Procedure(s) performed (including Critical Care):  Procedures  ____________________________________________   INITIAL IMPRESSION / ASSESSMENT AND PLAN / ED COURSE  As part of my medical decision making, I reviewed the following data within the [place] notes reviewed and incorporated, Labs reviewed , Old chart reviewed, and Notes from prior ED visits         Patient here with urinary retention likely from BPH.  We will start him on Flomax.  He has a Foley catheter in place and reports feeling much better.  Urine shows no sign of infection.  We will  have him follow-up with urology in the next 1 to 2 weeks.  Discussed return precautions.  He is comfortable with this plan.  Labs do show a leukocytosis of 16,000 which is likely reactive.  He is nontoxic with benign abdominal exam and no fever.  Also has slight hyponatremia which looks like this has been an issue for him before.  Discussed this finding with patient and recommend he have this rechecked by his primary care doctor.  Discussed return precautions.  Patient is comfortable with plan for discharge home.  At this time, I do not feel there is any life-threatening condition present. I have reviewed, interpreted and discussed all results (EKG, imaging, lab, urine as appropriate) and exam findings with patient/family. I have reviewed nursing notes and appropriate previous records.  I feel the patient is safe to be discharged home without further emergent workup and can continue workup as an outpatient as needed. Discussed usual and customary return precautions. Patient/family verbalize understanding and are comfortable with this plan.  Outpatient follow-up has been provided as needed. All questions have  been answered.  ____________________________________________   FINAL CLINICAL IMPRESSION(S) / ED DIAGNOSES  Final diagnoses:  Urinary retention  Hyponatremia     ED Discharge Orders          Ordered    tamsulosin (FLOMAX) 0.4 MG CAPS capsule  Daily        08/11/21 0131            *Please note:  Okley Mcgarey was evaluated in Emergency Department on 08/11/2021 for the symptoms described in the history of present illness. He was evaluated in the context of the global COVID-19 pandemic, which necessitated consideration that the patient might be at risk for infection with the SARS-CoV-2 virus that causes COVID-19. Institutional protocols and algorithms that pertain to the evaluation of patients at risk for COVID-19 are in a state of rapid change based on information released by regulatory bodies including the CDC and federal and state organizations. These policies and algorithms were followed during the patient's care in the ED.  Some ED evaluations and interventions may be delayed as a result of limited staffing during and the pandemic.*   Note:  This document was prepared using Dragon voice recognition software and may include unintentional dictation errors.    Jartavious Mckimmy, Delice Bison, DO 08/11/21 [phone]

## 2021-08-11 NOTE — ED Notes (Signed)
Lab called about labs.  Blood is in lab already

## 2021-11-04 ENCOUNTER — Emergency Department
Admission: EM | Admit: 2021-11-04 | Discharge: 2021-11-04 | Disposition: A | Discharge status: Home / Self Care | Payer: Self-pay | Attending: Emergency Medicine | Admitting: Emergency Medicine

## 2021-11-04 ENCOUNTER — Other Ambulatory Visit: Payer: Self-pay

## 2021-11-04 ENCOUNTER — Ambulatory Visit: Admission: EM | Admit: 2021-11-04 | Discharge: 2021-11-04 | Payer: Self-pay

## 2021-11-04 ENCOUNTER — Emergency Department: Payer: Self-pay

## 2021-11-04 DIAGNOSIS — R338 Other retention of urine: Secondary | ICD-10-CM

## 2021-11-04 DIAGNOSIS — D72829 Elevated white blood cell count, unspecified: Secondary | ICD-10-CM | POA: Insufficient documentation

## 2021-11-04 DIAGNOSIS — N5089 Other specified disorders of the male genital organs: Secondary | ICD-10-CM

## 2021-11-04 DIAGNOSIS — F17210 Nicotine dependence, cigarettes, uncomplicated: Secondary | ICD-10-CM | POA: Insufficient documentation

## 2021-11-04 DIAGNOSIS — N50819 Testicular pain, unspecified: Secondary | ICD-10-CM

## 2021-11-04 DIAGNOSIS — N451 Epididymitis: Secondary | ICD-10-CM | POA: Insufficient documentation

## 2021-11-04 DIAGNOSIS — N50812 Left testicular pain: Secondary | ICD-10-CM

## 2021-11-04 LAB — CBC WITH DIFFERENTIAL/PLATELET
Abs Immature Granulocytes: 0.05 10*3/uL (ref 0.00–0.07)
Basophils Absolute: 0.1 10*3/uL (ref 0.0–0.1)
Basophils Relative: 1 %
Eosinophils Absolute: 0.2 10*3/uL (ref 0.0–0.5)
Eosinophils Relative: 1 %
HCT: 35 % — ABNORMAL LOW (ref 39.0–52.0)
Hemoglobin: 11.6 g/dL — ABNORMAL LOW (ref 13.0–17.0)
Immature Granulocytes: 0 %
Lymphocytes Relative: 21 %
Lymphs Abs: 2.6 10*3/uL (ref 0.7–4.0)
MCH: 27.3 pg (ref 26.0–34.0)
MCHC: 33.1 g/dL (ref 30.0–36.0)
MCV: 82.4 fL (ref 80.0–100.0)
Monocytes Absolute: 1.5 10*3/uL — ABNORMAL HIGH (ref 0.1–1.0)
Monocytes Relative: 12 %
Neutro Abs: 8.2 10*3/uL — ABNORMAL HIGH (ref 1.7–7.7)
Neutrophils Relative %: 65 %
Platelets: 497 10*3/uL — ABNORMAL HIGH (ref 150–400)
RBC: 4.25 MIL/uL (ref 4.22–5.81)
RDW: 17.4 % — ABNORMAL HIGH (ref 11.5–15.5)
WBC: 12.7 10*3/uL — ABNORMAL HIGH (ref 4.0–10.5)
nRBC: 0 % (ref 0.0–0.2)

## 2021-11-04 LAB — URINALYSIS, ROUTINE W REFLEX MICROSCOPIC
Bilirubin Urine: NEGATIVE
Glucose, UA: NEGATIVE mg/dL
Hgb urine dipstick: NEGATIVE
Ketones, ur: NEGATIVE mg/dL
Nitrite: POSITIVE — AB
Protein, ur: NEGATIVE mg/dL
Specific Gravity, Urine: 1.006 (ref 1.005–1.030)
Squamous Epithelial / HPF: NONE SEEN (ref 0–5)
pH: 7 (ref 5.0–8.0)

## 2021-11-04 LAB — COMPREHENSIVE METABOLIC PANEL
ALT: 10 U/L (ref 0–44)
AST: 15 U/L (ref 15–41)
Albumin: 3.5 g/dL (ref 3.5–5.0)
Alkaline Phosphatase: 61 U/L (ref 38–126)
Anion gap: 8 (ref 5–15)
BUN: 6 mg/dL — ABNORMAL LOW (ref 8–23)
CO2: 27 mmol/L (ref 22–32)
Calcium: 9.3 mg/dL (ref 8.9–10.3)
Chloride: 95 mmol/L — ABNORMAL LOW (ref 98–111)
Creatinine, Ser: 0.5 mg/dL — ABNORMAL LOW (ref 0.61–1.24)
GFR, Estimated: >60 mL/min (ref >60)
Glucose, Bld: 122 mg/dL — ABNORMAL HIGH (ref 70–99)
Potassium: 4.1 mmol/L (ref 3.5–5.1)
Sodium: 130 mmol/L — ABNORMAL LOW (ref 135–145)
Total Bilirubin: 0.5 mg/dL (ref 0.3–1.2)
Total Protein: 7.9 g/dL (ref 6.5–8.1)

## 2021-11-04 MED ORDER — LEVOFLOXACIN 500 MG PO TABS
500.0000 mg | ORAL_TABLET | Freq: Once | ORAL | Status: AC
  Administered 2021-11-04: 500 mg via ORAL
  Filled 2021-11-04: qty 1

## 2021-11-04 MED ORDER — OXYCODONE-ACETAMINOPHEN 5-325 MG PO TABS: 1.0000 | ORAL_TABLET | ORAL | 0 refills | Status: DC | PRN

## 2021-11-04 MED ORDER — LEVOFLOXACIN 500 MG PO TABS: 500.0000 mg | ORAL_TABLET | Freq: Every day | ORAL | 0 refills | Status: AC

## 2021-11-04 NOTE — ED Triage Notes (Signed)
Patient presents to Urgent Care with complaints of testicular pain since Saturday. He reports increased swelling and pain. Treating pain with ibuprofen and ice. He has stated urinary retention.   Denies fever.

## 2021-11-04 NOTE — ED Provider Notes (Signed)
[facility] Emergency Department Provider Note   ____________________________________________   Event Date/Time   First MD Initiated Contact with Patient 11/04/21 1933     (approximate)  I have reviewed the triage vital signs and the nursing notes.   HISTORY  Chief Complaint Testicle Pain    HPI Kyle Larson is a 65 y.o. male who reports left testicular pain and swelling since Saturday.  He was sent from urgent care here.  He denies any urgency or frequency.  He is not having a fever or any other complaints at this point.  Pain is moderately severe.         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/21/2016    Past Surgical History:  Procedure Laterality Date   ACROMIO-CLAVICULAR JOINT REPAIR Left 09/11/2016   Procedure: ACROMIO-CLAVICULAR RECONSTRUCTION;  Surgeon: Corky Mull, MD;  Location: [facility] ORS;  Service: Orthopedics;  Laterality: Left;  clavicle   LEFT HEART CATH AND CORONARY ANGIOGRAPHY N/A 05/20/2021   Procedure: LEFT HEART CATH AND CORONARY ANGIOGRAPHY with coronary intervention;  Surgeon: [name], MD;  Location: [place] [facility];  Service: Cardiovascular;  Laterality: N/A;   MANDIBLE RECONSTRUCTION     TONSILLECTOMY     AGE 81   XI ROBOTIC ASSISTED INGUINAL HERNIA REPAIR WITH MESH Right 03/26/2020   Procedure: XI ROBOTIC ASSISTED INGUINAL HERNIA REPAIR WITH MESH;  Surgeon: Herbert Pun, MD;  Location: [facility] ORS;  Service: General;  Laterality: Right;    Prior to Admission medications   Medication Sig Start Date End Date Taking?  Authorizing Provider  levofloxacin (LEVAQUIN) 500 MG tablet Take 1 tablet (500 mg total) by mouth daily for 10 days. 11/04/21 11/14/21 Yes Nena Polio, MD  oxyCODONE-acetaminophen (PERCOCET) 5-325 MG tablet Take 1 tablet by mouth every 4 (four) hours as needed for severe pain. 11/04/21 11/04/22 Yes Nena Polio, MD  tamsulosin (FLOMAX) 0.4 MG CAPS capsule Take 1 capsule (0.4 mg total) by mouth daily. 08/11/21   Ward, Delice Bison, DO    Allergies Patient has no known allergies.  No family history on file.  Social History Social History   Tobacco Use   Smoking status: Every Day    Packs/day: 1.00    Years: 15.00    Pack years: 15.00    Types: Cigarettes   Smokeless tobacco: Never  Substance Use Topics   Alcohol use: Yes    Comment: OCC   Drug use: No    Review of Systems  Constitutional: No fever/chills Eyes: No visual changes. ENT: No sore throat. Cardiovascular: Denies chest pain. Respiratory: Denies shortness of breath. Gastrointestinal: No abdominal pain.  No nausea, no vomiting.  No diarrhea.  No constipation. Genitourinary: Negative for dysuria. Musculoskeletal: Negative for back pain. Skin: Negative for rash. Neurological: Negative for headaches, focal weakness   ____________________________________________   PHYSICAL EXAM:  VITAL SIGNS: ED Triage Vitals  Enc Vitals Group     BP 11/04/21 1543 (!) 140/100     Pulse Rate 11/04/21 1543 (!) 107  Resp 11/04/21 1543 17     Temp 11/04/21 1543 98.3 F (36.8 C)     Temp Source 11/04/21 1543 Oral     SpO2 11/04/21 1543 97 %     Weight 11/04/21 1545 135 lb (61.2 kg)     Height 11/04/21 1545 6' (1.829 m)     Head Circumference --      Peak Flow --      Pain Score 11/04/21 1544 10     Pain Loc --      Pain Edu? --      Excl. in [place]? --     Constitutional: Alert and oriented. Well appearing and in no acute distress. Eyes: Conjunctivae are normal.  Head: Atraumatic. Nose: No  congestion/rhinnorhea. Mouth/Throat: Mucous membranes are moist.  Oropharynx non-erythematous. Neck: No stridor.   Cardiovascular: Normal rate, regular rhythm. Grossly normal heart sounds.  Good peripheral circulation. Respiratory: Normal respiratory effort.  No retractions. Lungs CTAB. Gastrointestinal: Soft and nontender. No distention. No abdominal bruits.  Genitourinary: Normal male genitalia.  The left side of the scrotum is red and tender left testicle and epididymis are swollen and very tender.  Exam is made difficult by the tenderness. Musculoskeletal: No lower extremity tenderness nor edema.  Neurologic:  Normal speech and language. No gross focal neurologic deficits are appreciated. No gait instability. Skin:  Skin is warm, dry and intact. No rash noted.   ____________________________________________   LABS (all labs ordered are listed, but only abnormal results are displayed)  Labs Reviewed  CBC WITH DIFFERENTIAL/PLATELET - Abnormal; Notable for the following components:      Result Value   WBC 12.7 (*)    Hemoglobin 11.6 (*)    HCT 35.0 (*)    RDW 17.4 (*)    Platelets 497 (*)    Neutro Abs 8.2 (*)    Monocytes Absolute 1.5 (*)    All other components within normal limits  COMPREHENSIVE METABOLIC PANEL - Abnormal; Notable for the following components:   Sodium 130 (*)    Chloride 95 (*)    Glucose, Bld 122 (*)    BUN 6 (*)    Creatinine, Ser 0.50 (*)    All other components within normal limits  URINALYSIS, ROUTINE W REFLEX MICROSCOPIC - Abnormal; Notable for the following components:   Color, Urine YELLOW (*)    APPearance HAZY (*)    Nitrite POSITIVE (*)    Leukocytes,Ua LARGE (*)    Bacteria, UA MANY (*)    All other components within normal limits   ____________________________________________  EKG   ____________________________________________  RADIOLOGY Gertha Calkin, personally viewed and evaluated these images (plain radiographs) as part of  my medical decision making, as well as reviewing the written report by the radiologist.  ED MD interpretation: Ultrasound read by radiology is consistent with epididymitis.  This also matches the patient's exam.  Official radiology report(s): US SCROTUM W/DOPPLER  Result Date: 11/04/2021 CLINICAL DATA:  Left testicular pain and edema for 3 days. No known injury EXAM: SCROTAL ULTRASOUND DOPPLER ULTRASOUND OF THE TESTICLES TECHNIQUE: Complete ultrasound examination of the testicles, epididymis, and other scrotal structures was performed. Color and spectral Doppler ultrasound were also utilized to evaluate blood flow to the testicles. COMPARISON:  None. FINDINGS: Right testicle Measurements: 4.0 x 3.0 x 2.3 cm. No mass or microlithiasis visualized. Left testicle Measurements: 3.0 x 2.9 x 2.4. No mass or microlithiasis visualized. Right epididymis: Normal in size and appearance. Small cyst measuring up to  4 mm. Left epididymis: Left epididymis is enlarged measuring 5.1 x 1.0 by 1.7 cm with increased vascularity. Hydrocele:  Bilateral clear hydrocele, right greater than the left. Varicocele:  None visualized. Pulsed Doppler interrogation of both testes demonstrates normal low resistance arterial and venous waveforms bilaterally. IMPRESSION: 1. Enlarged and hyperemic left epididymis concerning for epididymitis. 2.  Bilateral hydrocele, the right greater than the left. 3. Normal Doppler flow to bilateral testes without evidence for torsion. Electronically Signed   By: Keane Police D.O.   On: 11/04/2021 17:10    ____________________________________________   PROCEDURES  Procedure(s) performed (including Critical Care):  Procedures   ____________________________________________   INITIAL IMPRESSION / ASSESSMENT AND PLAN / ED COURSE   My symptoms and clinically the patient has epididymitis.  This is confirmed by the ultrasound.  Patient does also have a urinalysis consistent with a UTI and a somewhat  elevated white count although his white count has been elevated in the past.  He also has a stable  hyponatremia.  I will treat the patient with Levaquin.  I have warned him and his significant other of the need to monitor for mental status changes and/or arm or leg pain which could be a sign of impending tendinopathy or rupture.  Patient will return if he is worse.  I will have him follow-up with Dr. Bernardo Heater who is the urologist on-call for recheck in about a week or so.  We will give him some Percocet if needed for pain.  I have warned him about constipation and need to be careful not to fall or drive on the Percocet as well.          ____________________________________________   FINAL CLINICAL IMPRESSION(S) / ED DIAGNOSES  Final diagnoses:  Testicle pain  Epididymitis     ED Discharge Orders          Ordered    levofloxacin (LEVAQUIN) 500 MG tablet  Daily        11/04/21 1937    oxyCODONE-acetaminophen (PERCOCET) 5-325 MG tablet  Every 4 hours PRN        11/04/21 1938             Note:  This document was prepared using Dragon voice recognition software and may include unintentional dictation errors.    Nena Polio, MD 11/04/21 1946

## 2021-11-04 NOTE — ED Provider Notes (Signed)
[facility]    CSN: [id number] Arrival date & time: 11/04/21  1212      History   Chief Complaint Chief Complaint  Patient presents with   Testicle Pain    HPI Kyle Larson is a 65 y.o. male.   HPI  65 year old male here for evaluation of genital issues.  Patient reports that he developed pain in his left testicle 2 days ago and then developed swelling yesterday.  He states that the swelling got so intense that he had to put ice on his testicle to relieve the pain and swelling but that it did not help.  He has been taking over-the-counter Tylenol and ibuprofen without any relief of his symptoms.  He states that today he started to have difficulty with starting his urinary stream and he experiences burning both before and during urination.  He is also experiencing nausea.  He denies any fever, vomiting, or blood in his urine.  Patient is in a significant amount of discomfort.  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/21/2016    Past Surgical History:  Procedure Laterality Date   ACROMIO-CLAVICULAR JOINT REPAIR Left 09/11/2016   Procedure: ACROMIO-CLAVICULAR RECONSTRUCTION;  Surgeon: Corky Mull, MD;  Location: [facility];  Service: Orthopedics;  Laterality: Left;  clavicle   LEFT HEART CATH AND CORONARY ANGIOGRAPHY N/A 05/20/2021   Procedure: LEFT HEART CATH AND CORONARY ANGIOGRAPHY with coronary intervention;  Surgeon: [name], MD;  Location: [place] [facility];  Service: Cardiovascular;  Laterality: N/A;   MANDIBLE RECONSTRUCTION     TONSILLECTOMY     AGE 10   XI  ROBOTIC ASSISTED INGUINAL HERNIA REPAIR WITH MESH Right 03/26/2020   Procedure: XI ROBOTIC ASSISTED INGUINAL HERNIA REPAIR WITH MESH;  Surgeon: Herbert Pun, MD;  Location: [facility];  Service: General;  Laterality: Right;       Home Medications    Prior to Admission medications   Medication Sig Start Date End Date Taking? Authorizing Provider  tamsulosin (FLOMAX) 0.4 MG CAPS capsule Take 1 capsule (0.4 mg total) by mouth daily. 08/11/21   Ward, Delice Bison, DO    Family History History reviewed. No pertinent family history.  Social History Social History   Tobacco Use   Smoking status: Every Day    Packs/day: 1.00    Years: 15.00    Pack years: 15.00    Types: Cigarettes   Smokeless tobacco: Never  Substance Use Topics   Alcohol use: Yes    Comment: OCC   Drug use: No     Allergies   Patient has no known allergies.   Review of Systems Review of Systems  Constitutional:  Negative for fever.  Gastrointestinal:  Positive for nausea. Negative for vomiting.  Genitourinary:  Positive for difficulty urinating, dysuria, scrotal swelling and testicular pain.  Skin:  Negative for color change.  Hematological: Negative.   Psychiatric/Behavioral: Negative.      Physical Exam Triage Vital Signs ED Triage Vitals  Enc Vitals Group     BP 11/04/21  1257 (!) 142/102     Pulse Rate 11/04/21 1257 (!) 106     Resp 11/04/21 1257 16     Temp 11/04/21 1257 98.2 F (36.8 C)     Temp Source 11/04/21 1257 Oral     SpO2 11/04/21 1257 99 %     Weight --      Height --      Head Circumference --      Peak Flow --      Pain Score 11/04/21 1256 10     Pain Loc --      Pain Edu? --      Excl. in [place]? --    No data found.  Updated Vital Signs BP (!) 142/102 (BP Location: Right Arm)   Pulse (!) 106   Temp 98.2 F (36.8 C) (Oral)   Resp 16   SpO2 99%   Visual Acuity Right Eye Distance:   Left Eye Distance:   Bilateral Distance:    Right Eye Near:   Left Eye Near:     Bilateral Near:     Physical Exam Vitals and nursing note reviewed.  Constitutional:      General: He is in acute distress.  HENT:     Head: Normocephalic and atraumatic.  Genitourinary:    Comments: Patient's right testicle is normal in size, smooth in texture, and free of tenderness.  The left testicle is markedly edematous, hard, and exquisitely tender.  I am unable to garner a good exam.  No hernia present on examination however. Skin:    General: Skin is warm and dry.     Capillary Refill: Capillary refill takes less than 2 seconds.     Findings: No bruising or erythema.  Neurological:     General: No focal deficit present.     Mental Status: He is alert and oriented to person, place, and time.  Psychiatric:        Mood and Affect: Mood normal.        Behavior: Behavior normal.        Thought Content: Thought content normal.        Judgment: Judgment normal.     UC Treatments / Results  Labs (all labs ordered are listed, but only abnormal results are displayed) Labs Reviewed - No data to display  EKG   Radiology No results found.  Procedures Procedures (including critical care time)  Medications Ordered in UC Medications - No data to display  Initial Impression / Assessment and Plan / UC Course  I have reviewed the triage vital signs and the nursing notes.  Pertinent labs & imaging results that were available during my care of the patient were reviewed by me and considered in my medical decision making (see chart for details).  Patient is a 65 year old male who is in significant pain presenting for examination of swelling and pain to his left testicle that has been ongoing for the past 2 days and has not responded to OTC NSAIDs, Tylenol, and ice.  Patient is now endorsing urinary hesitancy with dysuria.  No complaints of hematuria or fever.  Patient has had nausea but no vomiting.  On exam patient's left scrotum is markedly edematous and hard.  There is no  erythema or ecchymosis noted.  The right-sided scrotum is normal in appearance.  The right testicle is normal in size, smooth in texture, and free of tenderness.  The left testicle is unable to be adequately examined due to the exquisite tenderness that  the patient is experiencing at this point.  Patient does report that he is having difficulty starting his stream and that it burns before and during urination.  No hematuria.  Unclear about urgency or frequency of urination.  Will order UA to look for the presence of infection but due to the swelling and exquisite tenderness I have also ordered a scrotal ultrasound to evaluate for torsion or other pathology.  Patient is unable to provide a urine specimen as he is unable to generate a urinary stream which is concerning for acute urinary retention.  We are also unable to get an ultrasound due to outpatient imaging schedule being full today.  For this reason I have suggested to the patient that he needs to go to the emergency department to be evaluated because he is got acute urinary retention and will need a catheter, which we cannot place in the urgent care, and he needs an ultrasound of his scrotum to figure out the cause of his testicular pain and swelling.  Patient left ambulatory via POV to go to [facility] ER.   Final Clinical Impressions(s) / UC Diagnoses   Final diagnoses:  Testicular pain, left  Testicular swelling, left  Acute urinary retention     Discharge Instructions      Because you are unable to urinate there is a concern that you have developed urinary retention, possibly secondary to what ever is causing the swelling in your scrotum and pain in your left testicle, and therefore I recommend you go to the emergency department at [facility] for evaluation and catheter placement as we are unable to place a catheter here in the urgent care.     ED Prescriptions   None    PDMP not reviewed this encounter.   Margarette Canada, NP 11/04/21  1407

## 2021-11-04 NOTE — ED Triage Notes (Signed)
Pt c/o left testicle pain with swelling and hardness since Saturday, denies injury

## 2021-11-04 NOTE — Discharge Instructions (Addendum)
Because you are unable to urinate there is a concern that you have developed urinary retention, possibly secondary to what ever is causing the swelling in your scrotum and pain in your left testicle, and therefore I recommend you go to the emergency department at [facility] for evaluation and catheter placement as we are unable to place a catheter here in the urgent care.

## 2021-11-04 NOTE — Discharge Instructions (Addendum)
Please take the Levaquin 1 pill a day.  If she needs something for pain you can take the Percocet 1 pill 4 times a day.  Be careful the Percocet can make you sleepy and constipated.  Do not drive on it.  The Levaquin occasionally will make you act differently.  If that happens stop it and come in.  Occasionally Levaquin can cause tendon rupture this is most often preceded by pain in the arms or legs if you develop this please return.  Please follow-up with Dr. Bernardo Heater the urologist.  Give his office a call in the morning and let him know you are in the emergency room with epididymitis.  He can check on you in a week or so and make sure you are improving.  If you start to get worse with worsening pain or fever or vomiting or more swelling please return here quickly.

## 2021-11-04 NOTE — ED Notes (Signed)
Patient is being discharged from the Urgent Care and sent to the Emergency Department via POV . Per Thurmond Butts, NP patient is in need of higher level of care due to urinary retention and testicular pain, requiring [place]. Patient is aware and verbalizes understanding of plan of care.  Vitals:   11/04/21 1257  BP: (!) 142/102  Pulse: (!) 106  Resp: 16  Temp: 98.2 F (36.8 C)  SpO2: 99%

## 2021-11-04 NOTE — ED Provider Notes (Signed)
Emergency Medicine Provider Triage Evaluation Note  Kyle Larson , a 65 y.o. male  was evaluated in triage.  Pt complains of left testicle pain and swelling.  Patient notes onset of symptoms since Saturday.  He was referred from urgent care for further evaluation via testicular ultrasound/Doppler.  Patient would endorse some urinary retention but denies any hematuria or dysuria.  Review of Systems  Positive: Left testicle pain /swelling Negative: FCS  Physical Exam  BP (!) 140/100 (BP Location: Left Arm)   Pulse (!) 107   Temp 98.3 F (36.8 C) (Oral)   Resp 17   Ht 6' (1.829 m)   Wt 61.2 kg   SpO2 97%   BMI 18.31 kg/m  Gen:   Awake, no distress  NAD Resp:  Normal effort CTA MSK:   Moves extremities without difficulty  Other:  ABD: soft, nontender  Medical Decision Making  Medically screening exam initiated at 3:49 PM.  Appropriate orders placed.  Marcin Mcquaid was informed that the remainder of the evaluation will be completed by another provider, this initial triage assessment does not replace that evaluation, and the importance of remaining in the ED until their evaluation is complete.  Patient with ED evaluation of left testicular pain and swelling since Saturday.  He denies any injury or trauma.   Melvenia Needles, PA-C 11/04/21 1553    Arta Silence, MD 11/04/21 1926

## 2021-11-26 ENCOUNTER — Other Ambulatory Visit: Payer: Self-pay

## 2021-11-26 ENCOUNTER — Ambulatory Visit
Admission: EM | Admit: 2021-11-26 | Discharge: 2021-11-26 | Disposition: A | Discharge status: Home / Self Care | Payer: Self-pay | Attending: Internal Medicine | Admitting: Internal Medicine

## 2021-11-26 DIAGNOSIS — N3090 Cystitis, unspecified without hematuria: Secondary | ICD-10-CM | POA: Insufficient documentation

## 2021-11-26 DIAGNOSIS — R3911 Hesitancy of micturition: Secondary | ICD-10-CM | POA: Insufficient documentation

## 2021-11-26 LAB — URINALYSIS, COMPLETE (UACMP) WITH MICROSCOPIC
Bilirubin Urine: NEGATIVE
Glucose, UA: NEGATIVE mg/dL
Hgb urine dipstick: NEGATIVE
Ketones, ur: NEGATIVE mg/dL
Nitrite: NEGATIVE
Protein, ur: NEGATIVE mg/dL
Specific Gravity, Urine: 1.015 (ref 1.005–1.030)
pH: 6.5 (ref 5.0–8.0)

## 2021-11-26 MED ORDER — TAMSULOSIN HCL 0.4 MG PO CAPS: 0.4000 mg | ORAL_CAPSULE | Freq: Every day | ORAL | 0 refills | Status: DC

## 2021-11-26 MED ORDER — CIPROFLOXACIN HCL 500 MG PO TABS: 500.0000 mg | ORAL_TABLET | Freq: Two times a day (BID) | ORAL | 0 refills | Status: DC

## 2021-11-26 NOTE — ED Triage Notes (Signed)
Pt treated on 12/12 for Epididymitis. Was taking Levaquin and Tamsulosin and was improving but reports he needs more of the medicine. Reports dysuria and urgency, frequency.

## 2021-11-26 NOTE — ED Provider Notes (Signed)
[facility]    CSN: [id number] Arrival date & time: 11/26/21  1317      History   Chief Complaint Chief Complaint  Patient presents with   Urinary Frequency   Dysuria    HPI Kyle Larson is a 66 y.o. male. Who presents due to having dysuria, frequency and urgency x 4 days. Has had hesitation several times. Denies fever, chills or flank pain. His urine looked cloudy again. Denies testicular swelling or pain. He was approved to have disability, so will work on getting a PCP which his cardiologist sent him to one, but never went due to not having insurance.    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/21/2016    Past Surgical History:  Procedure Laterality Date   ACROMIO-CLAVICULAR JOINT REPAIR Left 09/11/2016   Procedure: ACROMIO-CLAVICULAR RECONSTRUCTION;  Surgeon: Corky Mull, MD;  Location: [facility];  Service: Orthopedics;  Laterality: Left;  clavicle   LEFT HEART CATH AND CORONARY ANGIOGRAPHY N/A 05/20/2021   Procedure: LEFT HEART CATH AND CORONARY ANGIOGRAPHY with coronary intervention;  Surgeon: [name], MD;  Location: [facility];  Service: Cardiovascular;  Laterality: N/A;   MANDIBLE RECONSTRUCTION     TONSILLECTOMY     AGE 17   XI ROBOTIC ASSISTED INGUINAL HERNIA REPAIR WITH MESH Right 03/26/2020   Procedure: XI ROBOTIC ASSISTED INGUINAL HERNIA REPAIR WITH MESH;  Surgeon: Herbert Pun, MD;  Location: [facility];  Service: General;  Laterality: Right;       Home Medications    Prior to Admission medications   Not on File    Family  History History reviewed. No pertinent family history.  Social History Social History   Tobacco Use   Smoking status: Every Day    Packs/day: 1.00    Years: 15.00    Pack years: 15.00    Types: Cigarettes   Smokeless tobacco: Never  Substance Use Topics   Alcohol use: Yes    Comment: OCC   Drug use: No     Allergies   Patient has no known allergies.   Review of Systems Review of Systems  Constitutional:  Negative for chills and fever.  Gastrointestinal:  Negative for constipation.  Genitourinary:  Positive for dysuria, frequency and urgency. Negative for flank pain.       + hesitation + cloudy urine  Skin:  Negative for rash.    Physical Exam Triage Vital Signs ED Triage Vitals  Enc Vitals Group     BP 11/26/21 1403 140/89     Pulse Rate 11/26/21 1403 95     Resp 11/26/21 1403 16     Temp 11/26/21 1403 98.3 F (36.8 C)     Temp Source 11/26/21 1403 Oral     SpO2 11/26/21 1403 98 %     Weight 11/26/21 1400 135 lb (61.2 kg)     Height 11/26/21 1400 6' (1.829 m)     Head Circumference --      Peak Flow --  Pain Score 11/26/21 1400 3     Pain Loc --      Pain Edu? --      Excl. in [place]? --    No data found.  Updated Vital Signs BP 140/89 (BP Location: Left Arm)    Pulse 95    Temp 98.3 F (36.8 C) (Oral)    Resp 16    Ht 6' (1.829 m)    Wt 135 lb (61.2 kg)    SpO2 98%    BMI 18.31 kg/m   Visual Acuity Right Eye Distance:   Left Eye Distance:   Bilateral Distance:    Right Eye Near:   Left Eye Near:    Bilateral Near:     Physical Exam Physical Exam Vitals and nursing note reviewed.  Constitutional:      General: She is not in acute distress.    Appearance: She is not toxic-appearing.  HENT:     Head: Normocephalic.     Right Ear: External ear normal.     Left Ear: External ear normal.  Eyes:     General: No scleral icterus.    Conjunctiva/sclera: Conjunctivae normal.  Pulmonary:     Effort: Pulmonary effort is normal.  Abdominal:      General: Bowel sounds are normal.     Palpations: Abdomen is soft. There is no mass.     Tenderness: has tenderness on suprapubic region, but bladder does not feel distended. There is no guarding or rebound.     Comments: - CVA tenderness   Musculoskeletal:        General: Normal range of motion.     Cervical back: Neck supple.   Skin:    General: Skin is warm and dry.     Findings: No rash.  Neurological:     Mental Status: She is alert and oriented to person, place, and time.     Gait: Gait normal.  Psychiatric:        Mood and Affect: Mood normal.        Behavior: Behavior normal.        Thought Content: Thought content normal.        Judgment: Judgment normal.    UC Treatments / Results  Labs (all labs ordered are listed, but only abnormal results are displayed) Labs Reviewed  URINALYSIS, COMPLETE (UACMP) WITH MICROSCOPIC    EKG   Radiology No results found.  Procedures Procedures (including critical care time)  Medications Ordered in UC Medications - No data to display  Initial Impression / Assessment and Plan / UC Course  I have reviewed the triage vital signs and the nursing notes.  Pertinent labs results that were available during my care of the patient were reviewed by me and considered in my medical decision making (see chart for details). UTI and possibly has BPH I placed him on Cipro and Refilled the Flomax. See instructions.  Urine culture ordered.    Final Clinical Impressions(s) / UC Diagnoses   Final diagnoses:  None   Discharge Instructions   None    ED Prescriptions   None    PDMP not reviewed this encounter.   Shelby Mattocks, PA-C 11/26/21 1502

## 2021-11-26 NOTE — Discharge Instructions (Addendum)
Your urine shows early infection and I sent prescription for infection. You need to see a urologist for your flow problems and to continue being prescribed Tamsulosin  Make an appointment to get established with a primary care doctor and follow up with a urologist for your flow problems. We cant continue refilling the Tamsulosin here.   I am ordering a urine culture, and if we need to change your antibiotic we will do that, but we will call you.

## 2021-11-28 LAB — URINE CULTURE
Culture: NO GROWTH
Special Requests: NORMAL

## 2021-12-18 ENCOUNTER — Encounter: Payer: Self-pay | Admitting: Urology

## 2021-12-18 ENCOUNTER — Ambulatory Visit (INDEPENDENT_AMBULATORY_CARE_PROVIDER_SITE_OTHER): Payer: Self-pay | Admitting: Urology

## 2021-12-18 ENCOUNTER — Other Ambulatory Visit: Payer: Self-pay

## 2021-12-18 VITALS — BP 143/89 | HR 55 | Ht 72.0 in | Wt 140.0 lb

## 2021-12-18 DIAGNOSIS — N401 Enlarged prostate with lower urinary tract symptoms: Secondary | ICD-10-CM

## 2021-12-18 DIAGNOSIS — N390 Urinary tract infection, site not specified: Secondary | ICD-10-CM

## 2021-12-18 DIAGNOSIS — N138 Other obstructive and reflux uropathy: Secondary | ICD-10-CM

## 2021-12-18 DIAGNOSIS — Z125 Encounter for screening for malignant neoplasm of prostate: Secondary | ICD-10-CM

## 2021-12-18 LAB — URINALYSIS, COMPLETE
Bilirubin, UA: NEGATIVE
Glucose, UA: NEGATIVE
Nitrite, UA: NEGATIVE
Protein,UA: NEGATIVE
RBC, UA: NEGATIVE
Specific Gravity, UA: 1.02 (ref 1.005–1.030)
Urobilinogen, Ur: 0.2 mg/dL (ref 0.2–1.0)
pH, UA: 6.5 (ref 5.0–7.5)

## 2021-12-18 LAB — MICROSCOPIC EXAMINATION: Bacteria, UA: NONE SEEN

## 2021-12-18 LAB — BLADDER SCAN AMB NON-IMAGING

## 2021-12-18 NOTE — Progress Notes (Signed)
12/18/21 10:56 AM   Kyle Larson 04-03-1956 [id number]  CC: BPH/urinary symptoms, history of urinary retention, UTIs  HPI: 66 year old male who has not gotten routine medical care who presents with worsening urinary symptoms over the last year.  He reports weak stream, difficulty initiating urination with a 5 to 10-minute delay before being able to void, significant urinary frequency overnight, incontinence during the day.  This culminated in urinary retention in September 2022 when a Foley catheter was placed in the ER for an elevated bladder scan of 572ml with lower abdominal pain.  It sounds like he removed his catheter at home in the shower by cutting the balloon port.  He then developed a UTI/epididymitis that was diagnosed in early December.  Most recently, he was seen in the ER on 11/26/2021 with urinary frequency and dysuria and discharged on Cipro and Flomax with urology follow-up recommended.  There are no prior PSA values to review.  No prior cross-sectional imaging to evaluate prostate volume.  He drinks a fair amount of coffee, Gatorade, Pepsi, and tea, as well as beers in the evening.  He denies any gross hematuria aside from the episode when he had a catheter in.  He has an extensive 50-pack-year smoking history, and continues to smoke a pack a day.  He reports family history of prostate cancer in his father.   PMH: Past Medical History:  Diagnosis Date   AC joint dislocation    Metatarsal bone fracture    3rd metatarsal , left foot; April 2021   Syncope    June '22   Tobacco abuse     Surgical History: Past Surgical History:  Procedure Laterality Date   ACROMIO-CLAVICULAR JOINT REPAIR Left 09/11/2016   Procedure: ACROMIO-CLAVICULAR RECONSTRUCTION;  Surgeon: Corky Mull, MD;  Location: [facility];  Service: Orthopedics;  Laterality: Left;  clavicle   LEFT HEART CATH AND CORONARY ANGIOGRAPHY N/A 05/20/2021   Procedure: LEFT HEART CATH AND CORONARY ANGIOGRAPHY with  coronary intervention;  Surgeon: [name], MD;  Location: [facility];  Service: Cardiovascular;  Laterality: N/A;   MANDIBLE RECONSTRUCTION     TONSILLECTOMY     AGE 86   XI ROBOTIC ASSISTED INGUINAL HERNIA REPAIR WITH MESH Right 03/26/2020   Procedure: XI ROBOTIC ASSISTED INGUINAL HERNIA REPAIR WITH MESH;  Surgeon: Herbert Pun, MD;  Location: [facility];  Service: General;  Laterality: Right;     Social History:  reports that he has been smoking cigarettes. He has a 15.00 pack-year smoking history. He has never used smokeless tobacco. He reports current alcohol use. He reports that he does not use drugs.  Physical Exam: BP (!) 143/89    Pulse (!) 55    Ht 6' (1.829 m)    Wt 140 lb (63.5 kg)    BMI 18.99 kg/m    Constitutional:  Alert and oriented, No acute distress. Cardiovascular: No clubbing, cyanosis, or edema. Respiratory: Normal respiratory effort, no increased work of breathing. GI: Abdomen is soft, nontender, nondistended, no abdominal masses GU: Widely patent meatus, no lesions DRE: 80 g, smooth, no nodules or masses  Laboratory Data: Urinalysis today 0-5 WBCs, 0-2 RBCs, no bacteria, nitrite negative, trace leukocytes  Pertinent Imaging: I have personally viewed and interpreted the scrotal ultrasound dated 11/04/2021 showing left epididymitis and bilateral hydroceles  Assessment & Plan:   66 year old male with BPH symptoms of weak stream, incomplete emptying, Foley dependent urinary retention in September 2022, and UTIs.  No prior PSA values, and he  has a family history of prostate cancer.  DRE shows an enlarged 80 g prostate that is smooth with no nodules or masses.    High suspicion for BPH as etiology of all of his urinary symptoms, but recommend PSA today to rule out prostate cancer with his family history.  We discussed possible need for prostate biopsy pending PSA results.  I also recommended cystoscopy/TRUS for evaluation of anatomy and prostate  volume prior to proceeding with an outlet procedure, but most likely would be a candidate for HOLEP for definitive management of his BPH and urinary symptoms.  We discussed the risks and benefits of HoLEP at length.  The procedure requires general anesthesia and takes 1 to 2 hours, and a holmium laser is used to enucleate the prostate and push this tissue into the bladder.  A morcellator is then used to remove this tissue, which is sent for pathology.  The vast majority(>95%) of patients are able to discharge the same day with a catheter in place for 2 to 3 days, and will follow-up in clinic for a voiding trial.  We specifically discussed the risks of bleeding, infection, retrograde ejaculation, temporary urgency and urge incontinence, very low risk of long-term incontinence, urethral stricture/bladder neck contracture, pathologic evaluation of prostate tissue and possible detection of prostate cancer or other malignancy, and possible need for additional procedures.  -PSA today -Follow-up for cystoscopy/TRUS(prostate biopsy if PSA elevated) -Anticipate HOLEP within the next month pending above results   Kyle Madrid, MD 12/18/2021  [facility] [address], [place], [place] [address] [phone]

## 2021-12-18 NOTE — Patient Instructions (Signed)
Holmium Laser Enucleation of the Prostate (HoLEP)  HoLEP is a treatment for men with benign prostatic hyperplasia (BPH). The laser surgery removed blockages of urine flow, and is done without any incisions on the body.     What is HoLEP?  HoLEP is a type of laser surgery used to treat obstruction (blockage) of urine flow as a result of benign prostatic hyperplasia (BPH). In men with BPH, the prostate gland is not cancerous, but has become enlarged. An enlarged prostate can result in a number of urinary tract symptoms such as weak urinary stream, difficulty in starting urination, inability to urinate, frequent urination, or getting up at night to urinate.  HoLEP was developed in the 1990's as a more effective and less expensive surgical option for BPH, compared to other surgical options such as laser vaporization(PVP/greenlight laser), transurethral resection of the prostate(TURP), and open simple prostatectomy.   What happens during a HoLEP?  HoLEP requires general anesthesia (asleep throughout the procedure).   An antibiotic is given to reduce the risk of infection  A surgical instrument called a resectoscope is inserted through the urethra (the tube that carries urine from the bladder). The resectoscope has a camera that allows the surgeon to view the internal structure of the prostate gland, and to see where the incisions are being made during surgery.  The laser is inserted into the resectoscope and is used to enucleate (free up) the enlarged prostate tissue from the capsule (outer shell) and then to seal up any blood vessels. The tissue that has been removed is pushed back into the bladder.  A morcellator is placed through the resectoscope, and is used to suction out the prostate tissue that has been pushed into the bladder.  When the prostate tissue has been removed, the resectoscope is removed, and a foley catheter is placed to allow healing and drain the urine from the  bladder.     What happens after a HoLEP?  More than 90% of patients go home the same day a few hours after surgery. Less than 10% will be admitted to the hospital overnight for observation to monitor the urine, or if they have other medical problems.  Fluid is flushed through the catheter for about 1 hour after surgery to clear any blood from the urine. It is normal to have some blood in the urine after surgery. The need for blood transfusion is extremely rare.  Eating and drinking are permitted after the procedure once the patient has fully awakened from anesthesia.  The catheter is usually removed 2-3 days after surgery- the patient will come to clinic to have the catheter removed and make sure they can urinate on their own.  It is very important to drink lots of fluids after surgery for one week to keep the bladder flushed.  At first, there may be some burning with urination, but this typically improved within a few hours to days. Most patients do not have a significant amount of pain, and narcotic pain medications are rarely needed.  Symptoms of urinary frequency, urgency, and even leakage are NORMAL for the first few weeks after surgery as the bladder adjusts after having to work hard against blockage from the prostate for many years. This will improve, but can sometimes take several months.  The use of pelvic floor exercises (Kegel exercises) can help improve problems with urinary incontinence.   After catheter removal, patients will be seen at 6 weeks and 6 months for symptom check  No heavy lifting for  at least 2-3 weeks after surgery, however patients can walk and do light activities the first day after surgery. Return to work time depends on occupation.    What are the advantages of HoLEP?  HoLEP has been studied in many different parts of the world and has been shown to be a safe and effective procedure. Although there are many types of BPH surgeries available, HoLEP offers a  unique advantage in being able to remove a large amount of tissue without any incisions on the body, even in very large prostates, while decreasing the risk of bleeding and providing tissue for pathology (to look for cancer). This decreases the need for blood transfusions during surgery, minimizes hospital stay, and reduces the risk of needing repeat treatment.  What are the side effects of HoLEP?  Temporary burning and bleeding during urination. Some blood may be seen in the urine for weeks after surgery and is part of the healing process.  Urinary incontinence (inability to control urine flow) is expected in all patients immediately after surgery and they should wear pads for the first few days/weeks. This typically improves over the course of several weeks. Performing Kegel exercises can help decrease leakage from stress maneuvers such as coughing, sneezing, or lifting. The rate of long term leakage is very low. Patients may also have leakage with urgency and this may be treated with medication. The risk of urge incontinence can be dependent on several factors including age, prostate size, symptoms, and other medical problems.  Retrograde ejaculation or backwards ejaculation. In 75% of cases, the patient will not see any fluid during ejaculation after surgery.  Erectile function is generally not significantly affected.   What are the risks of HoLEP?  Injury to the urethra or development of scar tissue at a later date  Injury to the capsule of the prostate (typically treated with longer catheterization).  Injury to the bladder or ureteral orifices (where the urine from the kidney drains out)  Infection of the bladder, testes, or kidneys  Return of urinary obstruction at a later date requiring another operation (<2%)  Need for blood transfusion or re-operation due to bleeding  Failure to relieve all symptoms and/or need for prolonged catheterization after surgery  5-15% of patients are  found to have previously undiagnosed prostate cancer in their specimen. Prostate cancer can be treated after HoLEP.  Standard risks of anesthesia including blood clots, heart attacks, etc  When should I call my doctor?  Fever over 101.3 degrees  Inability to urinate, or large blood clots in the urine    Prostate Cancer Screening Prostate cancer screening is testing that is done to check for the presence of prostate cancer in men. The prostate gland is a walnut-sized gland that is located below the bladder and in front of the rectum in males. The function of the prostate is to add fluid to semen during ejaculation. Prostate cancer is one of the most common types of cancer in men. Who should have prostate cancer screening? Screening recommendations vary based on age and other risk factors, as well as between the professional organizations who make the recommendations. In general, screening is recommended if: You are age 68 to 70 and have an average risk for prostate cancer. You should talk with your health care provider about your need for screening and how often screening should be done. Because most prostate cancers are slow growing and will not cause death, screening in this age group is generally reserved for men who have  a 10- to 15-year life expectancy. You are younger than age 73, and you have these risk factors: Having a father, brother, or uncle who has been diagnosed with prostate cancer. The risk is higher if your family member's cancer occurred at an early age or if you have multiple family members with prostate cancer at an early age. Being a male who is [place] or is of [place] or sub-Saharan African descent. In general, screening is not recommended if: You are younger than age 77. You are between the ages of 67 and 94 and you have no risk factors. You are 49 years of age or older. At this age, the risks that screening can cause are greater than the benefits that it may  provide. If you are at high risk for prostate cancer, your health care provider may recommend that you have screenings more often or that you start screening at a younger age. How is screening for prostate cancer done? The recommended prostate cancer screening test is a blood test called the prostate-specific antigen (PSA) test. PSA is a protein that is made in the prostate. As you age, your prostate naturally produces more PSA. Abnormally high PSA levels may be caused by: Prostate cancer. An enlarged prostate that is not caused by cancer (benign prostatic hyperplasia, or BPH). This condition is very common in older men. A prostate gland infection (prostatitis) or urinary tract infection. Certain medicines such as male hormones (like testosterone) or other medicines that raise testosterone levels. A rectal exam may be done as part of prostate cancer screening to help provide information about the size of your prostate gland. When a rectal exam is performed, it should be done after the PSA level is drawn to avoid any effect on the results. Depending on the PSA results, you may need more tests, such as: A physical exam to check the size of your prostate gland, if not done as part of screening. Blood and imaging tests. A procedure to remove tissue samples from your prostate gland for testing (biopsy). This is the only way to know for certain if you have prostate cancer. What are the benefits of prostate cancer screening? Screening can help to identify cancer at an early stage, before symptoms start and when the cancer can be treated more easily. There is a small chance that screening may lower your risk of dying from prostate cancer. The chance is small because prostate cancer is a slow-growing cancer, and most men with prostate cancer die from a different cause. What are the risks of prostate cancer screening? The main risk of prostate cancer screening is diagnosing and treating prostate cancer that  would never have caused any symptoms or problems. This is called overdiagnosisand overtreatment. PSA screening cannot tell you if your PSA is high due to cancer or a different cause. A prostate biopsy is the only procedure to diagnose prostate cancer. Even the results of a biopsy may not tell you if your cancer needs to be treated. Slow-growing prostate cancer may not need any treatment other than monitoring, so diagnosing and treating it may cause unnecessary stress or other side effects. Questions to ask your health care provider When should I start prostate cancer screening? What is my risk for prostate cancer? How often do I need screening? What type of screening tests do I need? How do I get my test results? What do my results mean? Do I need treatment? Where to find more information The American Cancer Society: www.cancer.org American Urological Association:  www.auanet.org Contact a health care provider if: You have difficulty urinating. You have pain when you urinate or ejaculate. You have blood in your urine or semen. You have pain in your back or in the area of your prostate. Summary Prostate cancer is a common type of cancer in men. The prostate gland is located below the bladder and in front of the rectum. This gland adds fluid to semen during ejaculation. Prostate cancer screening may identify cancer at an early stage, when the cancer can be treated more easily and is less likely to have spread to other areas of the body. The prostate-specific antigen (PSA) test is the recommended screening test for prostate cancer, but it has associated risks. Discuss the risks and benefits of prostate cancer screening with your health care provider. If you are age 27 or older, the risks that screening can cause are greater than the benefits that it may provide. This information is not intended to replace advice given to you by your health care provider. Make sure you discuss any questions you have  with your health care provider. Document Revised: 05/06/2021 Document Reviewed: 05/06/2021 Elsevier Patient Education  [employer].

## 2021-12-19 ENCOUNTER — Other Ambulatory Visit: Payer: Self-pay | Admitting: Urology

## 2021-12-19 ENCOUNTER — Telehealth: Payer: Self-pay

## 2021-12-19 LAB — PSA TOTAL (REFLEX TO FREE): Prostate Specific Ag, Serum: 5.5 ng/mL — ABNORMAL HIGH (ref 0.0–4.0)

## 2021-12-19 LAB — FPSA% REFLEX
% FREE PSA: 9.6 %
PSA, FREE: 0.53 ng/mL

## 2021-12-19 MED ORDER — DIAZEPAM 5 MG PO TABS: 5.0000 mg | ORAL_TABLET | Freq: Once | ORAL | 0 refills | Status: DC | PRN

## 2021-12-19 NOTE — Telephone Encounter (Signed)
Called pt no answer. LM for pt to call back to review biopsy instructions. 1st attempt. Information also sent via [place].

## 2021-12-19 NOTE — Telephone Encounter (Signed)
-----   Message from Billey Co, MD sent at 12/19/2021  8:13 AM EST ----- PSA is slightly elevated, we will need to do prostate biopsy to rule out prostate cancer, can change upcoming cysto/TRUS to cysto/prostate biopsy, please review instructions, I will send in 5mg  valium as well  Nickolas Madrid, MD 12/19/2021

## 2021-12-22 LAB — CULTURE, URINE COMPREHENSIVE

## 2021-12-24 NOTE — Telephone Encounter (Signed)
Called pt informed him of the information below, pt voiced understanding. Bx instructions reviewed, mailed, and sent via mychart.

## 2022-01-02 ENCOUNTER — Other Ambulatory Visit: Payer: Self-pay

## 2022-01-02 MED ORDER — TAMSULOSIN HCL 0.4 MG PO CAPS: 0.4000 mg | ORAL_CAPSULE | Freq: Every day | ORAL | 0 refills | Status: DC

## 2022-01-02 NOTE — Telephone Encounter (Signed)
Patient called, needs refill on flomax.

## 2022-01-06 ENCOUNTER — Emergency Department: Payer: Medicare Other

## 2022-01-06 ENCOUNTER — Observation Stay: Payer: Medicare Other

## 2022-01-06 ENCOUNTER — Observation Stay
Admission: EM | Admit: 2022-01-06 | Discharge: 2022-01-07 | Disposition: A | Discharge status: Home / Self Care | Payer: Medicare Other | Attending: Internal Medicine | Admitting: Internal Medicine

## 2022-01-06 ENCOUNTER — Other Ambulatory Visit: Payer: Self-pay

## 2022-01-06 DIAGNOSIS — D509 Iron deficiency anemia, unspecified: Secondary | ICD-10-CM | POA: Insufficient documentation

## 2022-01-06 DIAGNOSIS — M6281 Muscle weakness (generalized): Secondary | ICD-10-CM | POA: Insufficient documentation

## 2022-01-06 DIAGNOSIS — Z7902 Long term (current) use of antithrombotics/antiplatelets: Secondary | ICD-10-CM | POA: Diagnosis not present

## 2022-01-06 DIAGNOSIS — R2990 Unspecified symptoms and signs involving the nervous system: Secondary | ICD-10-CM

## 2022-01-06 DIAGNOSIS — F17210 Nicotine dependence, cigarettes, uncomplicated: Secondary | ICD-10-CM | POA: Insufficient documentation

## 2022-01-06 DIAGNOSIS — R197 Diarrhea, unspecified: Secondary | ICD-10-CM | POA: Diagnosis not present

## 2022-01-06 DIAGNOSIS — R29898 Other symptoms and signs involving the musculoskeletal system: Secondary | ICD-10-CM | POA: Insufficient documentation

## 2022-01-06 DIAGNOSIS — J320 Chronic maxillary sinusitis: Secondary | ICD-10-CM | POA: Insufficient documentation

## 2022-01-06 DIAGNOSIS — F1010 Alcohol abuse, uncomplicated: Secondary | ICD-10-CM | POA: Diagnosis not present

## 2022-01-06 DIAGNOSIS — Z720 Tobacco use: Secondary | ICD-10-CM

## 2022-01-06 DIAGNOSIS — F419 Anxiety disorder, unspecified: Secondary | ICD-10-CM | POA: Insufficient documentation

## 2022-01-06 DIAGNOSIS — I639 Cerebral infarction, unspecified: Secondary | ICD-10-CM | POA: Diagnosis not present

## 2022-01-06 DIAGNOSIS — E871 Hypo-osmolality and hyponatremia: Secondary | ICD-10-CM | POA: Insufficient documentation

## 2022-01-06 DIAGNOSIS — Z79899 Other long term (current) drug therapy: Secondary | ICD-10-CM | POA: Insufficient documentation

## 2022-01-06 DIAGNOSIS — Z20822 Contact with and (suspected) exposure to covid-19: Secondary | ICD-10-CM | POA: Diagnosis not present

## 2022-01-06 DIAGNOSIS — G8194 Hemiplegia, unspecified affecting left nondominant side: Secondary | ICD-10-CM

## 2022-01-06 DIAGNOSIS — R531 Weakness: Secondary | ICD-10-CM | POA: Diagnosis present

## 2022-01-06 LAB — URINE DRUG SCREEN, QUALITATIVE (ARMC ONLY)
Amphetamines, Ur Screen: NOT DETECTED
Barbiturates, Ur Screen: NOT DETECTED
Benzodiazepine, Ur Scrn: NOT DETECTED
Cannabinoid 50 Ng, Ur ~~LOC~~: NOT DETECTED
Cocaine Metabolite,Ur ~~LOC~~: NOT DETECTED
MDMA (Ecstasy)Ur Screen: NOT DETECTED
Methadone Scn, Ur: NOT DETECTED
Opiate, Ur Screen: NOT DETECTED
Phencyclidine (PCP) Ur S: NOT DETECTED
Tricyclic, Ur Screen: NOT DETECTED

## 2022-01-06 LAB — CBC
HCT: 32.6 % — ABNORMAL LOW (ref 39.0–52.0)
Hemoglobin: 10.3 g/dL — ABNORMAL LOW (ref 13.0–17.0)
MCH: 25 pg — ABNORMAL LOW (ref 26.0–34.0)
MCHC: 31.6 g/dL (ref 30.0–36.0)
MCV: 79.1 fL — ABNORMAL LOW (ref 80.0–100.0)
Platelets: 451 10*3/uL — ABNORMAL HIGH (ref 150–400)
RBC: 4.12 MIL/uL — ABNORMAL LOW (ref 4.22–5.81)
RDW: 17.3 % — ABNORMAL HIGH (ref 11.5–15.5)
WBC: 10.3 10*3/uL (ref 4.0–10.5)
nRBC: 0 % (ref 0.0–0.2)

## 2022-01-06 LAB — DIFFERENTIAL
Abs Immature Granulocytes: 0.04 10*3/uL (ref 0.00–0.07)
Basophils Absolute: 0.1 10*3/uL (ref 0.0–0.1)
Basophils Relative: 1 %
Eosinophils Absolute: 0.3 10*3/uL (ref 0.0–0.5)
Eosinophils Relative: 3 %
Immature Granulocytes: 0 %
Lymphocytes Relative: 19 %
Lymphs Abs: 1.9 10*3/uL (ref 0.7–4.0)
Monocytes Absolute: 1.4 10*3/uL — ABNORMAL HIGH (ref 0.1–1.0)
Monocytes Relative: 14 %
Neutro Abs: 6.6 10*3/uL (ref 1.7–7.7)
Neutrophils Relative %: 63 %

## 2022-01-06 LAB — LIPID PANEL
Cholesterol: 151 mg/dL (ref 0–200)
HDL: 74 mg/dL (ref >40)
LDL Cholesterol: 70 mg/dL (ref 0–99)
Total CHOL/HDL Ratio: 2 RATIO
Triglycerides: 35 mg/dL (ref <150)
VLDL: 7 mg/dL (ref 0–40)

## 2022-01-06 LAB — RETICULOCYTES
Immature Retic Fract: 16.5 % — ABNORMAL HIGH (ref 2.3–15.9)
RBC.: 3.97 MIL/uL — ABNORMAL LOW (ref 4.22–5.81)
Retic Count, Absolute: 67.9 10*3/uL (ref 19.0–186.0)
Retic Ct Pct: 1.7 % (ref 0.4–3.1)

## 2022-01-06 LAB — IRON AND TIBC
Iron: 23 ug/dL — ABNORMAL LOW (ref 45–182)
Saturation Ratios: 6 % — ABNORMAL LOW (ref 17.9–39.5)
TIBC: 391 ug/dL (ref 250–450)
UIBC: 368 ug/dL

## 2022-01-06 LAB — COMPREHENSIVE METABOLIC PANEL
ALT: 12 U/L (ref 0–44)
AST: 19 U/L (ref 15–41)
Albumin: 3.6 g/dL (ref 3.5–5.0)
Alkaline Phosphatase: 51 U/L (ref 38–126)
Anion gap: 8 (ref 5–15)
BUN: 6 mg/dL — ABNORMAL LOW (ref 8–23)
CO2: 25 mmol/L (ref 22–32)
Calcium: 9.1 mg/dL (ref 8.9–10.3)
Chloride: 97 mmol/L — ABNORMAL LOW (ref 98–111)
Creatinine, Ser: 0.59 mg/dL — ABNORMAL LOW (ref 0.61–1.24)
GFR, Estimated: >60 mL/min (ref >60)
Glucose, Bld: 127 mg/dL — ABNORMAL HIGH (ref 70–99)
Potassium: 3.6 mmol/L (ref 3.5–5.1)
Sodium: 130 mmol/L — ABNORMAL LOW (ref 135–145)
Total Bilirubin: 0.4 mg/dL (ref 0.3–1.2)
Total Protein: 7 g/dL (ref 6.5–8.1)

## 2022-01-06 LAB — PROTIME-INR
INR: 0.9 (ref 0.8–1.2)
Prothrombin Time: 12.3 seconds (ref 11.4–15.2)

## 2022-01-06 LAB — RESP PANEL BY RT-PCR (FLU A&B, COVID) ARPGX2
Influenza A by PCR: NEGATIVE
Influenza B by PCR: NEGATIVE
SARS Coronavirus 2 by RT PCR: NEGATIVE

## 2022-01-06 LAB — FERRITIN: Ferritin: 8 ng/mL — ABNORMAL LOW (ref 24–336)

## 2022-01-06 LAB — CBG MONITORING, ED: Glucose-Capillary: 130 mg/dL — ABNORMAL HIGH (ref 70–99)

## 2022-01-06 LAB — HEMOGLOBIN A1C
Hgb A1c MFr Bld: 5.4 % (ref 4.8–5.6)
Mean Plasma Glucose: 108.28 mg/dL

## 2022-01-06 LAB — FOLATE: Folate: 78 ng/mL (ref >5.9)

## 2022-01-06 LAB — VITAMIN B12: Vitamin B-12: 234 pg/mL (ref 180–914)

## 2022-01-06 LAB — APTT: aPTT: 30 seconds (ref 24–36)

## 2022-01-06 MED ORDER — SODIUM CHLORIDE 0.9 % IV BOLUS
500.0000 mL | Freq: Once | INTRAVENOUS | Status: AC
  Administered 2022-01-06: 20:00:00 500 mL via INTRAVENOUS

## 2022-01-06 MED ORDER — TAMSULOSIN HCL 0.4 MG PO CAPS
0.4000 mg | ORAL_CAPSULE | Freq: Every day | ORAL | Status: DC
  Administered 2022-01-06: 19:00:00 0.4 mg via ORAL

## 2022-01-06 MED ORDER — ENOXAPARIN SODIUM 40 MG/0.4ML IJ SOSY
40.0000 mg | PREFILLED_SYRINGE | INTRAMUSCULAR | Status: DC
  Administered 2022-01-06: 40 mg via SUBCUTANEOUS
  Filled 2022-01-06: qty 0.4

## 2022-01-06 MED ORDER — LORAZEPAM 2 MG/ML IJ SOLN: 0.0000 mg | Freq: Two times a day (BID) | INTRAMUSCULAR | Status: DC

## 2022-01-06 MED ORDER — ADULT MULTIVITAMIN W/MINERALS CH
1.0000 | ORAL_TABLET | Freq: Every day | ORAL | Status: DC
  Administered 2022-01-06 – 2022-01-07 (×2): 1 via ORAL
  Filled 2022-01-06 (×2): qty 1

## 2022-01-06 MED ORDER — ACETAMINOPHEN 325 MG PO TABS: 650.0000 mg | ORAL_TABLET | ORAL | Status: DC | PRN

## 2022-01-06 MED ORDER — DM-GUAIFENESIN ER 30-600 MG PO TB12: 1.0000 | ORAL_TABLET | Freq: Two times a day (BID) | ORAL | Status: DC | PRN

## 2022-01-06 MED ORDER — ACETAMINOPHEN 160 MG/5ML PO SOLN
650.0000 mg | ORAL | Status: DC | PRN
  Filled 2022-01-06: qty 20.3

## 2022-01-06 MED ORDER — LORAZEPAM 2 MG/ML IJ SOLN: 0.0000 mg | Freq: Four times a day (QID) | INTRAMUSCULAR | Status: DC

## 2022-01-06 MED ORDER — ALBUTEROL SULFATE (2.5 MG/3ML) 0.083% IN NEBU: 2.5000 mg | INHALATION_SOLUTION | RESPIRATORY_TRACT | Status: DC | PRN

## 2022-01-06 MED ORDER — LORAZEPAM 1 MG PO TABS: 1.0000 mg | ORAL_TABLET | ORAL | Status: DC | PRN

## 2022-01-06 MED ORDER — THIAMINE HCL 100 MG/ML IJ SOLN: 100.0000 mg | Freq: Every day | INTRAMUSCULAR | Status: DC

## 2022-01-06 MED ORDER — STROKE: EARLY STAGES OF RECOVERY BOOK: Freq: Once | Status: AC

## 2022-01-06 MED ORDER — FOLIC ACID 1 MG PO TABS
1.0000 mg | ORAL_TABLET | Freq: Every day | ORAL | Status: DC
  Administered 2022-01-06 – 2022-01-07 (×2): 1 mg via ORAL
  Filled 2022-01-06 (×2): qty 1

## 2022-01-06 MED ORDER — SENNOSIDES-DOCUSATE SODIUM 8.6-50 MG PO TABS: 1.0000 | ORAL_TABLET | Freq: Every evening | ORAL | Status: DC | PRN

## 2022-01-06 MED ORDER — THIAMINE HCL 100 MG PO TABS
100.0000 mg | ORAL_TABLET | Freq: Every day | ORAL | Status: DC
  Administered 2022-01-06 – 2022-01-07 (×2): 100 mg via ORAL
  Filled 2022-01-06 (×2): qty 1

## 2022-01-06 MED ORDER — SODIUM CHLORIDE 0.9% FLUSH
3.0000 mL | Freq: Once | INTRAVENOUS | Status: AC
  Administered 2022-01-06: 3 mL via INTRAVENOUS

## 2022-01-06 MED ORDER — CLOPIDOGREL BISULFATE 75 MG PO TABS
75.0000 mg | ORAL_TABLET | Freq: Every day | ORAL | Status: DC
  Administered 2022-01-06 – 2022-01-07 (×2): 75 mg via ORAL
  Filled 2022-01-06 (×2): qty 1

## 2022-01-06 MED ORDER — LORAZEPAM 2 MG/ML IJ SOLN: 1.0000 mg | INTRAMUSCULAR | Status: DC | PRN

## 2022-01-06 MED ORDER — NICOTINE 21 MG/24HR TD PT24
21.0000 mg | MEDICATED_PATCH | Freq: Every day | TRANSDERMAL | Status: DC
  Administered 2022-01-06 – 2022-01-07 (×2): 21 mg via TRANSDERMAL
  Filled 2022-01-06 (×2): qty 1

## 2022-01-06 MED ORDER — IOHEXOL 350 MG/ML SOLN
75.0000 mL | Freq: Once | INTRAVENOUS | Status: AC | PRN
  Administered 2022-01-06: 75 mL via INTRAVENOUS

## 2022-01-06 MED ORDER — ASPIRIN 81 MG PO CHEW: 324.0000 mg | CHEWABLE_TABLET | Freq: Once | ORAL | Status: DC

## 2022-01-06 MED ORDER — ACETAMINOPHEN 650 MG RE SUPP: 650.0000 mg | RECTAL | Status: DC | PRN

## 2022-01-06 MED ORDER — ASPIRIN EC 81 MG PO TBEC
81.0000 mg | DELAYED_RELEASE_TABLET | Freq: Every day | ORAL | Status: DC
  Administered 2022-01-07: 81 mg via ORAL
  Filled 2022-01-06: qty 1

## 2022-01-06 MED ORDER — ONDANSETRON HCL 4 MG/2ML IJ SOLN: 4.0000 mg | Freq: Three times a day (TID) | INTRAMUSCULAR | Status: DC | PRN

## 2022-01-06 MED ORDER — ASPIRIN 81 MG PO CHEW
162.0000 mg | CHEWABLE_TABLET | Freq: Once | ORAL | Status: AC
  Administered 2022-01-06: 162 mg via ORAL

## 2022-01-06 MED ORDER — ATORVASTATIN CALCIUM 20 MG PO TABS
40.0000 mg | ORAL_TABLET | Freq: Every day | ORAL | Status: DC
  Administered 2022-01-07: 40 mg via ORAL
  Filled 2022-01-06: qty 2

## 2022-01-06 NOTE — ED Triage Notes (Signed)
Pt comes with c/o left sided facial numbness, arm numbness that started about hour ago. Pt states blurry vision. LKW 1130am.  Pt took aspirin.

## 2022-01-06 NOTE — ED Notes (Signed)
Dr Niu at bedside 

## 2022-01-06 NOTE — ED Provider Notes (Signed)
[facility] Provider Note    Event Date/Time   First MD Initiated Contact with Patient 01/06/22 1242     (approximate)   History   Code Stroke   HPI  Deray Dawes is a 66 y.o. male with prostate issues who comes in with left-sided weakness.  Patient reports that symptoms started on Saturday and they never went back to completely normal since then.  He reports however it was intermittently getting better and worse although and again never back to complete normal.  He reports weakness in his left side mostly in the arm.  Denies any chest pain, new shortness of breath or other concerns.       Physical Exam   Triage Vital Signs: ED Triage Vitals  Enc Vitals Group     BP 01/06/22 1243 126/80     Pulse Rate 01/06/22 1243 (!) 105     Resp 01/06/22 1243 18     Temp 01/06/22 1243 98.1 F (36.7 C)     Temp src --      SpO2 01/06/22 1243 100 %     Weight --      Height --      Head Circumference --      Peak Flow --      Pain Score 01/06/22 1240 0     Pain Loc --      Pain Edu? --      Excl. in [place]? --     Most recent vital signs: Vitals:   01/06/22 1243  BP: 126/80  Pulse: (!) 105  Resp: 18  Temp: 98.1 F (36.7 C)  SpO2: 100%     General: Awake, no distress.  CV:  Good peripheral perfusion.  Resp:  Normal effort.  Abd:  No distention.  Other:  NIH stroke scale of 7 with weakness on the left   ED Results / Procedures / Treatments   Labs (all labs ordered are listed, but only abnormal results are displayed) Labs Reviewed  CBC - Abnormal; Notable for the following components:      Result Value   RBC 4.12 (*)    Hemoglobin 10.3 (*)    HCT 32.6 (*)    MCV 79.1 (*)    MCH 25.0 (*)    RDW 17.3 (*)    Platelets 451 (*)    All other components within normal limits  DIFFERENTIAL - Abnormal; Notable for the following components:   Monocytes Absolute 1.4 (*)    All other components within normal limits  CBG MONITORING, ED -  Abnormal; Notable for the following components:   Glucose-Capillary 130 (*)    All other components within normal limits  PROTIME-INR  APTT  COMPREHENSIVE METABOLIC PANEL  I-STAT CREATININE, ED     EKG  My interpretation of EKG:  Sinus rate of 82 without any ST elevation or T wave inversions, normal intervals  RADIOLOGY I have reviewed the CT head no ICH   PROCEDURES:  Critical Care performed: No  .1-3 Lead EKG Interpretation Performed by: Vanessa Bentley, MD Authorized by: Vanessa , MD     Interpretation: normal     ECG rate:  80   ECG rate assessment: normal     Rhythm: sinus rhythm     Ectopy: none     Conduction: normal     MEDICATIONS ORDERED IN ED: Medications  sodium chloride flush (NS) 0.9 % injection 3 mL (has no administration in time range)  aspirin chewable  tablet 162 mg (162 mg Oral Given 01/06/22 1312)     IMPRESSION / MDM / ASSESSMENT AND PLAN / ED COURSE  I reviewed the triage vital signs and the nursing notes.                              Code stroke called from triage but patient was out of the window for tPA Differential diagnosis includes, but is not limited to, stroke versus hemorrhage versus mass.  No chest pain to suggest dissection.  CT head without any evidence of mass or hemorrhage.  Discussed with Dr. Theda Sers who recommended CTA.   Normal glucose CBC shows slightly low hemoglobin similar to prior White count normal CMP shows slightly low sodium but similar to prior Cholesterol test negative   Neurology recommended admission for stroke work-up  Patient to get aspirin.  Already took 2 this morning so given additional 2   The patient is on the cardiac monitor to evaluate for evidence of arrhythmia and/or significant heart rate changes.   FINAL CLINICAL IMPRESSION(S) / ED DIAGNOSES   Final diagnoses:  Stroke-like symptoms     Rx / DC Orders   ED Discharge Orders     None        Note:  This document was  prepared using Dragon voice recognition software and may include unintentional dictation errors.   Vanessa Continental, MD 01/06/22 [id number]
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
## 2022-01-06 NOTE — Progress Notes (Signed)
°   01/06/22 1300  Clinical Encounter Type  Visited With Patient and family together  Visit Type Initial;ED  Referral From Other (Comment) (Code Stroke)  Spiritual Encounters  Spiritual Needs Emotional  Stress Factors  Family Stress Factors Health changes   Chaplain received page and met with patient and wife in ED providing support through compassionate presence and conversation.

## 2022-01-06 NOTE — ED Notes (Signed)
Called activated code stroke to [place], [place]

## 2022-01-06 NOTE — Code Documentation (Signed)
Stroke Response Nurse Documentation Code Documentation  Gildardo Tickner is a 66 y.o. male arriving to [facility] ED via [place] on 01/06/2022. On No antithrombotic. Code stroke was activated by ED triage RN.   Patient from home where he was [facility] Saturday. Pt reports that his left arm went completely numb and flaccid Saturday and that lasted about 8-10hrs and then Sunday pt had same episode and it lasted 6 hrs, began again today at 1130. Pt states that each time he never fully regained movement back into the left arm. Pt reports now left arm unable to move and left sided facial numbness.  Stroke team at the bedside on patient arrival. Labs drawn and patient cleared for CT by EDP. Patient to CT with team. The following imaging was completed:  CT head done. Patient is not a candidate for IV Thrombolytic due to LKW >4.5hrs.   Bedside handoff with ED RN Bill.  Velta Addison Stroke Designer, fashion/clothing

## 2022-01-06 NOTE — ED Notes (Signed)
Called Secretary to call STROKE for pt

## 2022-01-06 NOTE — ED Notes (Signed)
Pt taken to Ct at this time.
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
## 2022-01-06 NOTE — Progress Notes (Signed)
CODE STROKE- PHARMACY COMMUNICATION   Time CODE STROKE called/page received: 1246  Time response to CODE STROKE was made (in person or via phone): 1246  Time Stroke Kit retrieved from [place] (only if needed): no TNK  Name of Provider/Nurse contacted: Dr. Theda Sers  Past Medical History:  Diagnosis Date   AC joint dislocation    Metatarsal bone fracture    3rd metatarsal , left foot; April 2021   Syncope    June '22   Tobacco abuse    Prior to Admission medications   Medication Sig Start Date End Date Taking? Authorizing Provider  diazepam (VALIUM) 5 MG tablet Take 1 tablet (5 mg total) by mouth once as needed for up to 1 dose (take 30 minutes prior to cysto/prostate biopsy). 12/19/21   Billey Co, MD  tamsulosin (FLOMAX) 0.4 MG CAPS capsule Take 1 capsule (0.4 mg total) by mouth daily after supper. 01/02/22   Billey Co, MD    Sherilyn Banker ,PharmD Clinical Pharmacist  01/06/2022  12:46 PM

## 2022-01-07 DIAGNOSIS — I639 Cerebral infarction, unspecified: Secondary | ICD-10-CM | POA: Diagnosis not present

## 2022-01-07 DIAGNOSIS — R2990 Unspecified symptoms and signs involving the nervous system: Secondary | ICD-10-CM

## 2022-01-07 DIAGNOSIS — E871 Hypo-osmolality and hyponatremia: Secondary | ICD-10-CM | POA: Diagnosis not present

## 2022-01-07 DIAGNOSIS — Z8673 Personal history of transient ischemic attack (TIA), and cerebral infarction without residual deficits: Secondary | ICD-10-CM

## 2022-01-07 DIAGNOSIS — F1010 Alcohol abuse, uncomplicated: Secondary | ICD-10-CM | POA: Diagnosis not present

## 2022-01-07 MED ORDER — CLOPIDOGREL BISULFATE 75 MG PO TABS: 75.0000 mg | ORAL_TABLET | Freq: Every day | ORAL | 0 refills | Status: DC

## 2022-01-07 MED ORDER — ATORVASTATIN CALCIUM 40 MG PO TABS: 40.0000 mg | ORAL_TABLET | Freq: Every day | ORAL | 0 refills | Status: DC

## 2022-01-07 MED ORDER — POLYSACCHARIDE IRON COMPLEX 150 MG PO CAPS
150.0000 mg | ORAL_CAPSULE | Freq: Every day | ORAL | Status: DC
  Administered 2022-01-07: 09:00:00 150 mg via ORAL
  Filled 2022-01-07: qty 1

## 2022-01-07 MED ORDER — ASPIRIN 81 MG PO TBEC: 81.0000 mg | DELAYED_RELEASE_TABLET | Freq: Every day | ORAL | 0 refills | Status: DC

## 2022-01-07 MED ORDER — FERROUS SULFATE 325 (65 FE) MG PO TABS: 325.0000 mg | ORAL_TABLET | Freq: Every day | ORAL | 0 refills | Status: DC
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
## 2022-01-07 NOTE — Progress Notes (Signed)
SLP Cancellation Note  Patient Details Name: Kyle Larson MRN: [id number] DOB: [birth year]   Cancelled treatment:       Reason Eval/Treat Not Completed: SLP screened, no needs identified, will sign off (chart reviewed; consulted MD/NSG then met w/ pt). MD just left bedside. Pt denied any difficulty swallowing and is currently on a regular diet; tolerates swallowing pills w/ water per NSG. Pt conversed in conversation w/out expressive/receptive deficits noted; pt denied any speech-language deficits. Speech clear. Stated he was "glad my left hand came back last night" indicating mild weakness in the hand.  No further skilled ST services indicated as pt appears at his baseline. Pt agreed. NSG to reconsult if any change in status while admitted.        Orinda Kenner, MS, CCC-SLP Speech Language Pathologist Rehab Services; [place] [phone] (ascom) Donielle Kaigler 01/07/2022, 8:33 AM

## 2022-01-07 NOTE — Progress Notes (Addendum)
Neurology Progress Note Kyle Larson MR# [id number] 01/07/2022   S: no overnight events; no new complaints.  O: Current vital signs: BP 124/83 (BP Location: Right Arm)    Pulse 87    Temp 97.7 F (36.5 C) (Oral)    Resp 16    Ht 6' (1.829 m)    Wt 61.2 kg    SpO2 97%    BMI 18.30 kg/m  Vital signs in last 24 hours: Temp:  [97.7 F (36.5 C)-98.2 F (36.8 C)] 97.7 F (36.5 C) (02/14 0755) Pulse Rate:  [73-105] 87 (02/14 0755) Resp:  [16-24] 16 (02/14 0755) BP: (116-132)/(74-91) 124/83 (02/14 0755) SpO2:  [95 %-100 %] 97 % (02/14 0755) Weight:  [61.2 kg] 61.2 kg (02/13 1500) GENERAL: Awake, alert in NAD HEENT: Normocephalic and atraumatic, moist mm, no LN++, no thyromegaly LUNGS: symmetric excursions bilaterally with no audible wheezes. CV: RR, equal pulses bilaterally. ABDOMEN: Soft, nontender, nondistended with normoactive BS Ext: warm, well perfused, intact peripheral pulses  NEURO:  Mental Status: AA&Ox3  Language: speech Intact naming, repetition, and comprehension. PERR. EOMI, visual fields full, no facial asymmetry, facial sensation intact, hearing intact. No evidence of tongue atrophy or fibrillations, tongue/uvula/soft palate midline elevates symmetrically  Tone is normal. Bulk is normal. Motor weakness has resolved Sensation is symmetric to light touch and temperature in the arms and legs except for numbness in left thumb. Deep Tendon Reflexes: brisk in left biceps and patellae. Babinski (+) L. FNF and HKS are intact right. Gait - Deferred  NIHSS 1  Labs     Component Value Date/Time   WBC 10.3 01/06/2022 1306   RBC 3.97 (L) 01/06/2022 1709   RBC 4.12 (L) 01/06/2022 1306   HGB 10.3 (L) 01/06/2022 1306   HCT 32.6 (L) 01/06/2022 1306   PLT 451 (H) 01/06/2022 1306   MCV 79.1 (L) 01/06/2022 1306   MCH 25.0 (L) 01/06/2022 1306   MCHC 31.6 01/06/2022 1306   RDW 17.3 (H) 01/06/2022 1306   LYMPHSABS 1.9 01/06/2022 1306   MONOABS 1.4 (H) 01/06/2022 1306    EOSABS 0.3 01/06/2022 1306   BASOSABS 0.1 01/06/2022 1306       Component Value Date/Time   NA 130 (L) 01/06/2022 1306   K 3.6 01/06/2022 1306   CL 97 (L) 01/06/2022 1306   CO2 25 01/06/2022 1306   GLUCOSE 127 (H) 01/06/2022 1306   BUN 6 (L) 01/06/2022 1306   CREATININE 0.59 (L) 01/06/2022 1306   CALCIUM 9.1 01/06/2022 1306   PROT 7.0 01/06/2022 1306   ALBUMIN 3.6 01/06/2022 1306   AST 19 01/06/2022 1306   ALT 12 01/06/2022 1306   ALKPHOS 51 01/06/2022 1306   BILITOT 0.4 01/06/2022 1306   GFRNONAA >60 01/06/2022 1306    Lab Results  Component Value Date   HGBA1C 5.4 01/06/2022   and  Lab Results  Component Value Date   LDLCALC 70 01/06/2022    Imaging I have reviewed images in epic and the results pertinent to this consultation are:  MRI Brain showed multiple small areas of acute ischemia in right greater than left MCA territories with the largest in the right precentral gyrus. No hemorrhage or mass effect.  Assessment: Kyle Larson is a 66 y.o. male PMHx as noted above with acute left extremity flaccid weakness found to have acute embolic strokes in right greater than left MCA territories. He has a chronic right cerebellar stroke and will likely need Holter monitor.  Impression:  Acute embolic strokes in right  greater than left MCA territories. Left sided weakness - resolved. NIHSS 1 Chronic right cerebellar stroke. Tobacco abuse Alcohol abuse Aortic Atherosclerosis (ICD10-I70.0) Emphysema (ICD10-J43.9) Maxillary apical molar abscess - Dr. Tami Ribas (ENT) reviewed CT and his impression is just chronic sinusitis. It is actually better comparing to 2022 images. He will follow as outpatient.   Plan: - Continue statin. - Continue aspirin 81mg  daily. - Continue clopidogrel 75mg  daily for 3 weeks. - Recommend follow up with outpatient neurology.  Addendum: Previous echo 04/2021 EF 60 to 65%, no wall motion abnormalities, no atrial level shunt.   Electronically  signed by:  Lynnae Sandhoff, MD Page: [phone] 01/07/2022, 8:57 AM  If 7pm- 7am, please page neurology on call as listed in [place].

## 2022-01-07 NOTE — Progress Notes (Signed)
Kyle Larson to be D/C'd Home per MD order.  Discussed prescriptions and follow up appointments with the patient. Prescriptions given to patient, medication list explained in detail. Pt verbalized understanding.wife here to transport pt home.  Allergies as of 01/07/2022   No Known Allergies      Medication List     TAKE these medications    aspirin 81 MG EC tablet Take 1 tablet (81 mg total) by mouth daily. Swallow whole. Start taking on: January 08, 2022   atorvastatin 40 MG tablet Commonly known as: LIPITOR Take 1 tablet (40 mg total) by mouth daily. Start taking on: January 08, 2022   clopidogrel 75 MG tablet Commonly known as: PLAVIX Take 1 tablet (75 mg total) by mouth daily. Start taking on: January 08, 2022   ferrous sulfate 325 (65 FE) MG tablet Take 1 tablet (325 mg total) by mouth daily.   multivitamin with minerals tablet Take 1 tablet by mouth daily.   tamsulosin 0.4 MG Caps capsule Commonly known as: FLOMAX Take 1 capsule (0.4 mg total) by mouth daily after supper.        Vitals:   01/07/22 0755 01/07/22 1148  BP: 124/83 119/75  Pulse: 87 97  Resp: 16 18  Temp: 97.7 F (36.5 C) (!) 97.5 F (36.4 C)  SpO2: 97% 98%    Tele box removed and returned. Skin clean, dry and intact without evidence of skin break down, no evidence of skin tears noted. IV catheter discontinued intact. Site without signs and symptoms of complications. Dressing and pressure applied. Pt denies pain at this time. No complaints noted.  An After Visit Summary was printed and given to the patient. Patient escorted via [place], and D/C home via private auto.  [name]
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
## 2022-01-09 ENCOUNTER — Other Ambulatory Visit: Payer: Medicaid Other | Admitting: Urology

## 2022-01-09 ENCOUNTER — Other Ambulatory Visit: Payer: Self-pay | Admitting: *Deleted

## 2022-01-09 DIAGNOSIS — N401 Enlarged prostate with lower urinary tract symptoms: Secondary | ICD-10-CM

## 2022-01-09 DIAGNOSIS — N138 Other obstructive and reflux uropathy: Secondary | ICD-10-CM

## 2022-01-16 ENCOUNTER — Ambulatory Visit: Payer: Medicaid Other | Admitting: Urology

## 2022-01-24 ENCOUNTER — Other Ambulatory Visit: Payer: Self-pay | Admitting: Urology

## 2022-02-05 ENCOUNTER — Encounter: Payer: Self-pay | Admitting: Intensive Care

## 2022-02-05 ENCOUNTER — Emergency Department
Admission: EM | Admit: 2022-02-05 | Discharge: 2022-02-05 | Disposition: A | Discharge status: Home / Self Care | Payer: Medicare Other | Attending: Emergency Medicine | Admitting: Emergency Medicine

## 2022-02-05 ENCOUNTER — Encounter: Payer: Self-pay | Admitting: Urology

## 2022-02-05 ENCOUNTER — Other Ambulatory Visit: Payer: Self-pay

## 2022-02-05 ENCOUNTER — Ambulatory Visit (INDEPENDENT_AMBULATORY_CARE_PROVIDER_SITE_OTHER): Payer: Medicare Other | Admitting: Urology

## 2022-02-05 VITALS — BP 122/80 | HR 105 | Ht 72.0 in | Wt 132.0 lb

## 2022-02-05 DIAGNOSIS — R972 Elevated prostate specific antigen [PSA]: Secondary | ICD-10-CM

## 2022-02-05 DIAGNOSIS — N138 Other obstructive and reflux uropathy: Secondary | ICD-10-CM

## 2022-02-05 DIAGNOSIS — R5383 Other fatigue: Secondary | ICD-10-CM

## 2022-02-05 DIAGNOSIS — K921 Melena: Secondary | ICD-10-CM

## 2022-02-05 DIAGNOSIS — D649 Anemia, unspecified: Secondary | ICD-10-CM | POA: Diagnosis present

## 2022-02-05 DIAGNOSIS — R0602 Shortness of breath: Secondary | ICD-10-CM

## 2022-02-05 DIAGNOSIS — Z5321 Procedure and treatment not carried out due to patient leaving prior to being seen by health care provider: Secondary | ICD-10-CM | POA: Insufficient documentation

## 2022-02-05 DIAGNOSIS — R339 Retention of urine, unspecified: Secondary | ICD-10-CM

## 2022-02-05 DIAGNOSIS — R42 Dizziness and giddiness: Secondary | ICD-10-CM | POA: Insufficient documentation

## 2022-02-05 DIAGNOSIS — N401 Enlarged prostate with lower urinary tract symptoms: Secondary | ICD-10-CM | POA: Diagnosis not present

## 2022-02-05 DIAGNOSIS — Z7901 Long term (current) use of anticoagulants: Secondary | ICD-10-CM

## 2022-02-05 LAB — CBC WITH DIFFERENTIAL/PLATELET
Abs Immature Granulocytes: 0.05 10*3/uL (ref 0.00–0.07)
Basophils Absolute: 0.1 10*3/uL (ref 0.0–0.1)
Basophils Relative: 1 %
Eosinophils Absolute: 0.1 10*3/uL (ref 0.0–0.5)
Eosinophils Relative: 1 %
HCT: 31.1 % — ABNORMAL LOW (ref 39.0–52.0)
Hemoglobin: 9.6 g/dL — ABNORMAL LOW (ref 13.0–17.0)
Immature Granulocytes: 0 %
Lymphocytes Relative: 17 %
Lymphs Abs: 2.1 10*3/uL (ref 0.7–4.0)
MCH: 25 pg — ABNORMAL LOW (ref 26.0–34.0)
MCHC: 30.9 g/dL (ref 30.0–36.0)
MCV: 81 fL (ref 80.0–100.0)
Monocytes Absolute: 1.7 10*3/uL — ABNORMAL HIGH (ref 0.1–1.0)
Monocytes Relative: 14 %
Neutro Abs: 8.4 10*3/uL — ABNORMAL HIGH (ref 1.7–7.7)
Neutrophils Relative %: 67 %
Platelets: 540 10*3/uL — ABNORMAL HIGH (ref 150–400)
RBC: 3.84 MIL/uL — ABNORMAL LOW (ref 4.22–5.81)
RDW: 21.2 % — ABNORMAL HIGH (ref 11.5–15.5)
Smear Review: NORMAL
WBC: 12.4 10*3/uL — ABNORMAL HIGH (ref 4.0–10.5)
nRBC: 0 % (ref 0.0–0.2)

## 2022-02-05 LAB — COMPREHENSIVE METABOLIC PANEL
ALT: 9 U/L (ref 0–44)
AST: 16 U/L (ref 15–41)
Albumin: 3.5 g/dL (ref 3.5–5.0)
Alkaline Phosphatase: 58 U/L (ref 38–126)
Anion gap: 10 (ref 5–15)
BUN: 5 mg/dL — ABNORMAL LOW (ref 8–23)
CO2: 24 mmol/L (ref 22–32)
Calcium: 9.1 mg/dL (ref 8.9–10.3)
Chloride: 96 mmol/L — ABNORMAL LOW (ref 98–111)
Creatinine, Ser: 0.6 mg/dL — ABNORMAL LOW (ref 0.61–1.24)
GFR, Estimated: >60 mL/min (ref >60)
Glucose, Bld: 107 mg/dL — ABNORMAL HIGH (ref 70–99)
Potassium: 3.7 mmol/L (ref 3.5–5.1)
Sodium: 130 mmol/L — ABNORMAL LOW (ref 135–145)
Total Bilirubin: 0.5 mg/dL (ref 0.3–1.2)
Total Protein: 7.7 g/dL (ref 6.5–8.1)

## 2022-02-05 LAB — BLADDER SCAN AMB NON-IMAGING

## 2022-02-05 NOTE — Progress Notes (Signed)
Patient ID: Kyle Larson, male   DOB: 08-Jan-1956, 66 y.o.   MRN: [id number] ?Simple Catheter Placement ? ?Due to urinary retention patient is present today for a foley cath placement.  Patient was cleaned and prepped in a sterile fashion with betadine. A 18 FR foley catheter was inserted, urine return was noted  1100m, urine was yellow in color.  The balloon was filled with 10cc of sterile water.  A leg bag was attached for drainage. Patient was also given a night bag to take home and was given instruction on how to change from one bag to another.  Patient was given instruction on proper catheter care.  Patient tolerated well, no complications were noted  ? ?Performed by: DEdwin Dada CMA ? ?Additional notes/ Follow up: pt will go to ER for evaluation of dark stools and SOB  ?

## 2022-02-05 NOTE — Progress Notes (Signed)
? ?  02/05/2022 ?2:52 PM  ? ?Kyle Larson ?1956-03-11 ?[id number] ? ?Reason for visit: BPH, urinary symptoms ? ?HPI: ?66 year old male who was added on as an urgent visit for difficulty urinating today.  He developed Foley dependent retention in September 2022 and a catheter was placed in the ER, then he remove this at home himself in the shower and developed a UTI after Foley removal.  I met him for the first time on 12/18/2021 when his primary complaints were difficulty initiating urination, straining, urinary frequency, and severe nocturia.  PSA at that time was elevated at 5.5(9% free), and with his family history of prostate cancer I recommended prostate biopsy and cystoscopy for further evaluation prior to considering outlet procedure with HOLEP.  DRE at that time showed 80 g smooth prostate but no nodules or masses. ? ?He did not follow-up, as he had a stroke in February 2023 and was started on anticoagulation with Plavix.  He reportedly discontinued this medication as well as the aspirin, and is only taking the Flomax.  I do not see that he ever followed up with neurology.  He is visibly short of breath today and tachycardic to 110.  He reports he has had dark/tarry stools since starting the Plavix. ? ?His PVR is only 134 mL today, but he is requesting catheter placement for his significant difficulty voiding.  With his history of retention I think this is not unreasonable.  A 18 [place] coud? Foley was placed with return of yellow urine, and 10 mL placed in the balloon.  Urine was sent for urinalysis and culture. ? ?I also counseled him to present to the ER today for his shortness of breath and fatigue, and tachycardia with dark stools and recent anticoagulation with significant concern for anemia.  We discussed the risk of stroke/MI/death if his hematocrit is low and he does not seek medical care.  He is a very challenging patient and is very hesitant to seek medical care or present to urgent care.  I  stressed the importance of this to the patient and his wife extensively.  I also contacted the ER with his history and my concerns. ? ?-ER today for further evaluation of shortness of breath/fatigue, concern for anemia and dark stools with possible GI bleed in the setting of Plavix ?-Follow-up urinalysis and culture ?-Consider prostate biopsy to rule out prostate cancer in the near future pending above work-up, and if prostate biopsy negative likely will need HOLEP for BPH and obstruction.  We will plan for cystoscopy at the time of prostate biopsy as well. ? ? ?I spent 45 total minutes on the day of the encounter including pre-visit review of the medical record, face-to-face time with the patient, and post visit ordering of labs/imaging/tests. ? ?Billey Co, MD ? ?[place] ?[address] ?[place], [place] [address] ?[phone] ? ?

## 2022-02-05 NOTE — ED Notes (Addendum)
Pt approaches First RN desk to inform this RN that he is going home due to not feeling well enough to wait and because he has soiled himself.  RN offered to help get patient cleaned up and get him a new pair of pants, but patient still refusing.  RN strongly advised for the patient to stay and be evaluated by MD due to his chief complaint and results, but patient still declining.  Pt signed MSE waiver.  Pt leaving facility with even and unlabored respirations and with his wife.   ?

## 2022-02-05 NOTE — ED Triage Notes (Signed)
Patient sent from urology today due to them being concerned about low hemoglobin due to patient looking pale and lightheaded. Patient has been having urinary retention and urology placed catheter today with leg bag.  ?

## 2022-02-07 ENCOUNTER — Other Ambulatory Visit: Payer: Medicare Other

## 2022-02-07 LAB — MICROSCOPIC EXAMINATION: Bacteria, UA: NONE SEEN

## 2022-02-07 LAB — URINALYSIS, COMPLETE
Bilirubin, UA: NEGATIVE
Glucose, UA: NEGATIVE
Ketones, UA: NEGATIVE
Leukocytes,UA: NEGATIVE
Nitrite, UA: NEGATIVE
Protein,UA: NEGATIVE
RBC, UA: NEGATIVE
Specific Gravity, UA: 1.015 (ref 1.005–1.030)
Urobilinogen, Ur: 1 mg/dL (ref 0.2–1.0)
pH, UA: 6.5 (ref 5.0–7.5)

## 2022-02-08 LAB — CULTURE, URINE COMPREHENSIVE

## 2022-02-12 ENCOUNTER — Other Ambulatory Visit: Payer: Medicare Other | Admitting: Urology

## 2022-02-19 ENCOUNTER — Ambulatory Visit: Payer: Medicaid Other | Admitting: Urology

## 2022-02-22 ENCOUNTER — Other Ambulatory Visit: Payer: Self-pay

## 2022-02-22 ENCOUNTER — Inpatient Hospital Stay: Payer: Medicare Other | Admitting: Anesthesiology

## 2022-02-22 ENCOUNTER — Inpatient Hospital Stay
Admission: EM | Admit: 2022-02-22 | Discharge: 2022-02-25 | DRG: 329 | Disposition: A | Discharge status: Home Health | Payer: Medicare Other | Attending: Internal Medicine | Admitting: Internal Medicine

## 2022-02-22 ENCOUNTER — Emergency Department: Payer: Medicare Other

## 2022-02-22 ENCOUNTER — Encounter: Payer: Self-pay | Admitting: Intensive Care

## 2022-02-22 ENCOUNTER — Encounter
Admission: EM | Disposition: A | Discharge status: Home Health | Payer: Self-pay | Source: Home / Self Care | Attending: Internal Medicine

## 2022-02-22 ENCOUNTER — Inpatient Hospital Stay: Payer: Medicare Other

## 2022-02-22 ENCOUNTER — Ambulatory Visit
Admission: EM | Admit: 2022-02-22 | Discharge: 2022-02-22 | Disposition: A | Discharge status: Other Acute Inpatient Hospital | Payer: Medicare Other

## 2022-02-22 DIAGNOSIS — Z7982 Long term (current) use of aspirin: Secondary | ICD-10-CM | POA: Diagnosis not present

## 2022-02-22 DIAGNOSIS — E43 Unspecified severe protein-calorie malnutrition: Secondary | ICD-10-CM | POA: Diagnosis present

## 2022-02-22 DIAGNOSIS — E871 Hypo-osmolality and hyponatremia: Secondary | ICD-10-CM | POA: Diagnosis present

## 2022-02-22 DIAGNOSIS — Z7902 Long term (current) use of antithrombotics/antiplatelets: Secondary | ICD-10-CM | POA: Diagnosis not present

## 2022-02-22 DIAGNOSIS — I639 Cerebral infarction, unspecified: Secondary | ICD-10-CM | POA: Diagnosis not present

## 2022-02-22 DIAGNOSIS — J90 Pleural effusion, not elsewhere classified: Secondary | ICD-10-CM | POA: Diagnosis present

## 2022-02-22 DIAGNOSIS — Z720 Tobacco use: Secondary | ICD-10-CM | POA: Diagnosis not present

## 2022-02-22 DIAGNOSIS — K6389 Other specified diseases of intestine: Secondary | ICD-10-CM | POA: Diagnosis not present

## 2022-02-22 DIAGNOSIS — Z79899 Other long term (current) drug therapy: Secondary | ICD-10-CM

## 2022-02-22 DIAGNOSIS — Z681 Body mass index (BMI) 19 or less, adult: Secondary | ICD-10-CM | POA: Diagnosis not present

## 2022-02-22 DIAGNOSIS — D500 Iron deficiency anemia secondary to blood loss (chronic): Secondary | ICD-10-CM | POA: Diagnosis not present

## 2022-02-22 DIAGNOSIS — Z833 Family history of diabetes mellitus: Secondary | ICD-10-CM | POA: Diagnosis not present

## 2022-02-22 DIAGNOSIS — N138 Other obstructive and reflux uropathy: Secondary | ICD-10-CM | POA: Diagnosis present

## 2022-02-22 DIAGNOSIS — N400 Enlarged prostate without lower urinary tract symptoms: Secondary | ICD-10-CM | POA: Diagnosis not present

## 2022-02-22 DIAGNOSIS — I2510 Atherosclerotic heart disease of native coronary artery without angina pectoris: Secondary | ICD-10-CM | POA: Diagnosis present

## 2022-02-22 DIAGNOSIS — N1330 Unspecified hydronephrosis: Secondary | ICD-10-CM | POA: Diagnosis present

## 2022-02-22 DIAGNOSIS — D509 Iron deficiency anemia, unspecified: Secondary | ICD-10-CM | POA: Diagnosis present

## 2022-02-22 DIAGNOSIS — R197 Diarrhea, unspecified: Secondary | ICD-10-CM | POA: Diagnosis not present

## 2022-02-22 DIAGNOSIS — E876 Hypokalemia: Secondary | ICD-10-CM | POA: Diagnosis not present

## 2022-02-22 DIAGNOSIS — Z8042 Family history of malignant neoplasm of prostate: Secondary | ICD-10-CM | POA: Diagnosis not present

## 2022-02-22 DIAGNOSIS — D75839 Thrombocytosis, unspecified: Secondary | ICD-10-CM | POA: Diagnosis present

## 2022-02-22 DIAGNOSIS — F17210 Nicotine dependence, cigarettes, uncomplicated: Secondary | ICD-10-CM | POA: Diagnosis present

## 2022-02-22 DIAGNOSIS — K56609 Unspecified intestinal obstruction, unspecified as to partial versus complete obstruction: Secondary | ICD-10-CM

## 2022-02-22 DIAGNOSIS — Z20822 Contact with and (suspected) exposure to covid-19: Secondary | ICD-10-CM | POA: Diagnosis present

## 2022-02-22 DIAGNOSIS — R1084 Generalized abdominal pain: Secondary | ICD-10-CM

## 2022-02-22 DIAGNOSIS — F1011 Alcohol abuse, in remission: Secondary | ICD-10-CM

## 2022-02-22 DIAGNOSIS — C20 Malignant neoplasm of rectum: Secondary | ICD-10-CM

## 2022-02-22 DIAGNOSIS — N401 Enlarged prostate with lower urinary tract symptoms: Secondary | ICD-10-CM | POA: Diagnosis present

## 2022-02-22 DIAGNOSIS — K56691 Other complete intestinal obstruction: Secondary | ICD-10-CM

## 2022-02-22 DIAGNOSIS — J439 Emphysema, unspecified: Secondary | ICD-10-CM | POA: Diagnosis present

## 2022-02-22 DIAGNOSIS — Z8673 Personal history of transient ischemic attack (TIA), and cerebral infarction without residual deficits: Secondary | ICD-10-CM

## 2022-02-22 DIAGNOSIS — F1010 Alcohol abuse, uncomplicated: Secondary | ICD-10-CM | POA: Diagnosis present

## 2022-02-22 DIAGNOSIS — I2583 Coronary atherosclerosis due to lipid rich plaque: Secondary | ICD-10-CM

## 2022-02-22 DIAGNOSIS — C218 Malignant neoplasm of overlapping sites of rectum, anus and anal canal: Secondary | ICD-10-CM | POA: Diagnosis present

## 2022-02-22 DIAGNOSIS — C19 Malignant neoplasm of rectosigmoid junction: Secondary | ICD-10-CM | POA: Diagnosis not present

## 2022-02-22 DIAGNOSIS — Z955 Presence of coronary angioplasty implant and graft: Secondary | ICD-10-CM | POA: Diagnosis not present

## 2022-02-22 DIAGNOSIS — K639 Disease of intestine, unspecified: Secondary | ICD-10-CM | POA: Diagnosis present

## 2022-02-22 DIAGNOSIS — K6289 Other specified diseases of anus and rectum: Secondary | ICD-10-CM

## 2022-02-22 HISTORY — DX: Cerebral infarction, unspecified: I63.9

## 2022-02-22 HISTORY — DX: Atherosclerotic heart disease of native coronary artery without angina pectoris: I25.10

## 2022-02-22 LAB — CBC
HCT: 31.2 % — ABNORMAL LOW (ref 39.0–52.0)
Hemoglobin: 9.9 g/dL — ABNORMAL LOW (ref 13.0–17.0)
MCH: 24.1 pg — ABNORMAL LOW (ref 26.0–34.0)
MCHC: 31.7 g/dL (ref 30.0–36.0)
MCV: 75.9 fL — ABNORMAL LOW (ref 80.0–100.0)
Platelets: 727 10*3/uL — ABNORMAL HIGH (ref 150–400)
RBC: 4.11 MIL/uL — ABNORMAL LOW (ref 4.22–5.81)
RDW: 19.5 % — ABNORMAL HIGH (ref 11.5–15.5)
WBC: 21.1 10*3/uL — ABNORMAL HIGH (ref 4.0–10.5)
nRBC: 0 % (ref 0.0–0.2)

## 2022-02-22 LAB — COMPREHENSIVE METABOLIC PANEL
ALT: 12 U/L (ref 0–44)
AST: 16 U/L (ref 15–41)
Albumin: 2.9 g/dL — ABNORMAL LOW (ref 3.5–5.0)
Alkaline Phosphatase: 68 U/L (ref 38–126)
Anion gap: 10 (ref 5–15)
BUN: 6 mg/dL — ABNORMAL LOW (ref 8–23)
CO2: 24 mmol/L (ref 22–32)
Calcium: 9 mg/dL (ref 8.9–10.3)
Chloride: 94 mmol/L — ABNORMAL LOW (ref 98–111)
Creatinine, Ser: 0.56 mg/dL — ABNORMAL LOW (ref 0.61–1.24)
GFR, Estimated: >60 mL/min (ref >60)
Glucose, Bld: 120 mg/dL — ABNORMAL HIGH (ref 70–99)
Potassium: 3.6 mmol/L (ref 3.5–5.1)
Sodium: 128 mmol/L — ABNORMAL LOW (ref 135–145)
Total Bilirubin: 0.6 mg/dL (ref 0.3–1.2)
Total Protein: 7.1 g/dL (ref 6.5–8.1)

## 2022-02-22 LAB — URINALYSIS, ROUTINE W REFLEX MICROSCOPIC
Bacteria, UA: NONE SEEN
Bilirubin Urine: NEGATIVE
Glucose, UA: NEGATIVE mg/dL
Hgb urine dipstick: NEGATIVE
Ketones, ur: 5 mg/dL — AB
Nitrite: NEGATIVE
Protein, ur: NEGATIVE mg/dL
Specific Gravity, Urine: 1.018 (ref 1.005–1.030)
Squamous Epithelial / HPF: NONE SEEN (ref 0–5)
pH: 9 — ABNORMAL HIGH (ref 5.0–8.0)

## 2022-02-22 LAB — RESP PANEL BY RT-PCR (FLU A&B, COVID) ARPGX2
Influenza A by PCR: NEGATIVE
Influenza B by PCR: NEGATIVE
SARS Coronavirus 2 by RT PCR: NEGATIVE

## 2022-02-22 LAB — LIPASE, BLOOD: Lipase: 19 U/L (ref 11–51)

## 2022-02-22 LAB — ABO/RH: ABO/RH(D): O POS

## 2022-02-22 SURGERY — CREATION, COLOSTOMY, ROBOT-ASSISTED
Anesthesia: General

## 2022-02-22 MED ORDER — BUPIVACAINE LIPOSOME 1.3 % IJ SUSP
INTRAMUSCULAR | Status: DC | PRN
Start: 2022-02-22 — End: 2022-02-22
  Administered 2022-02-22: 20 mL

## 2022-02-22 MED ORDER — FENTANYL CITRATE PF 50 MCG/ML IJ SOSY
50.0000 ug | PREFILLED_SYRINGE | Freq: Once | INTRAMUSCULAR | Status: AC
  Administered 2022-02-22: 50 ug via INTRAVENOUS
  Filled 2022-02-22: qty 1

## 2022-02-22 MED ORDER — DROPERIDOL 2.5 MG/ML IJ SOLN
0.6250 mg | Freq: Once | INTRAMUSCULAR | Status: DC | PRN
  Filled 2022-02-22: qty 2

## 2022-02-22 MED ORDER — PIPERACILLIN-TAZOBACTAM 3.375 G IVPB
3.3750 g | Freq: Three times a day (TID) | INTRAVENOUS | Status: DC
  Administered 2022-02-22 – 2022-02-25 (×8): 3.375 g via INTRAVENOUS
  Filled 2022-02-22 (×8): qty 50

## 2022-02-22 MED ORDER — SODIUM CHLORIDE 0.9 % IV SOLN: INTRAVENOUS | Status: DC | PRN

## 2022-02-22 MED ORDER — PHENYLEPHRINE HCL (PRESSORS) 10 MG/ML IV SOLN
INTRAVENOUS | Status: DC | PRN
  Administered 2022-02-22 (×2): 80 ug via INTRAVENOUS

## 2022-02-22 MED ORDER — PROPOFOL 10 MG/ML IV BOLUS
INTRAVENOUS | Status: DC | PRN
  Administered 2022-02-22: 100 mg via INTRAVENOUS

## 2022-02-22 MED ORDER — ONDANSETRON HCL 4 MG PO TABS: 4.0000 mg | ORAL_TABLET | Freq: Four times a day (QID) | ORAL | Status: DC | PRN

## 2022-02-22 MED ORDER — LIDOCAINE HCL (CARDIAC) PF 100 MG/5ML IV SOSY
PREFILLED_SYRINGE | INTRAVENOUS | Status: DC | PRN
Start: 2022-02-22 — End: 2022-02-22
  Administered 2022-02-22: 100 mg via INTRAVENOUS

## 2022-02-22 MED ORDER — LACTATED RINGERS IV SOLN: INTRAVENOUS | Status: DC

## 2022-02-22 MED ORDER — SUGAMMADEX SODIUM 200 MG/2ML IV SOLN
INTRAVENOUS | Status: DC | PRN
  Administered 2022-02-22: 200 mg via INTRAVENOUS

## 2022-02-22 MED ORDER — THIAMINE HCL 100 MG/ML IJ SOLN: 100.0000 mg | INTRAMUSCULAR | Status: AC

## 2022-02-22 MED ORDER — BUPIVACAINE-EPINEPHRINE (PF) 0.25% -1:200000 IJ SOLN
INTRAMUSCULAR | Status: DC | PRN
  Administered 2022-02-22: 30 mL via PERINEURAL

## 2022-02-22 MED ORDER — SODIUM CHLORIDE 0.9 % IV BOLUS
1000.0000 mL | Freq: Once | INTRAVENOUS | Status: AC
  Administered 2022-02-22: 1000 mL via INTRAVENOUS

## 2022-02-22 MED ORDER — ONDANSETRON HCL 4 MG/2ML IJ SOLN
INTRAMUSCULAR | Status: DC | PRN
  Administered 2022-02-22: 4 mg via INTRAVENOUS

## 2022-02-22 MED ORDER — PIPERACILLIN-TAZOBACTAM 3.375 G IVPB 30 MIN
3.3750 g | Freq: Once | INTRAVENOUS | Status: AC
  Administered 2022-02-22: 3.375 g via INTRAVENOUS
  Filled 2022-02-22: qty 50

## 2022-02-22 MED ORDER — ACETAMINOPHEN 325 MG PO TABS: 650.0000 mg | ORAL_TABLET | Freq: Four times a day (QID) | ORAL | Status: DC | PRN

## 2022-02-22 MED ORDER — ACETAMINOPHEN 10 MG/ML IV SOLN
INTRAVENOUS | Status: AC
  Filled 2022-02-22: qty 100

## 2022-02-22 MED ORDER — THIAMINE HCL 100 MG PO TABS
100.0000 mg | ORAL_TABLET | Freq: Every day | ORAL | Status: DC
  Administered 2022-02-25: 100 mg via ORAL
  Filled 2022-02-22 (×2): qty 1

## 2022-02-22 MED ORDER — PROMETHAZINE HCL 25 MG/ML IJ SOLN: 6.2500 mg | INTRAMUSCULAR | Status: DC | PRN

## 2022-02-22 MED ORDER — FENTANYL CITRATE (PF) 100 MCG/2ML IJ SOLN
INTRAMUSCULAR | Status: AC
  Filled 2022-02-22: qty 2

## 2022-02-22 MED ORDER — HYDROMORPHONE HCL 1 MG/ML IJ SOLN
1.0000 mg | INTRAMUSCULAR | Status: DC | PRN
  Administered 2022-02-24 (×2): 1 mg via INTRAVENOUS
  Filled 2022-02-22 (×2): qty 1

## 2022-02-22 MED ORDER — SODIUM CHLORIDE 0.9 % IV SOLN: 10.0000 mL/h | Freq: Once | INTRAVENOUS | Status: DC

## 2022-02-22 MED ORDER — ONDANSETRON HCL 4 MG/2ML IJ SOLN
4.0000 mg | Freq: Once | INTRAMUSCULAR | Status: AC
  Administered 2022-02-22: 4 mg via INTRAVENOUS
  Filled 2022-02-22: qty 2

## 2022-02-22 MED ORDER — ESMOLOL HCL 100 MG/10ML IV SOLN
INTRAVENOUS | Status: DC | PRN
  Administered 2022-02-22 (×2): 30 mg via INTRAVENOUS

## 2022-02-22 MED ORDER — MIDAZOLAM HCL 2 MG/2ML IJ SOLN
INTRAMUSCULAR | Status: AC
Start: 2022-02-22 — End: ?
  Filled 2022-02-22: qty 2

## 2022-02-22 MED ORDER — IOHEXOL 300 MG/ML  SOLN
75.0000 mL | Freq: Once | INTRAMUSCULAR | Status: AC | PRN
  Administered 2022-02-22: 75 mL via INTRAVENOUS

## 2022-02-22 MED ORDER — KETOROLAC TROMETHAMINE 15 MG/ML IJ SOLN
15.0000 mg | Freq: Four times a day (QID) | INTRAMUSCULAR | Status: DC
Start: 2022-02-23 — End: 2022-02-25
  Administered 2022-02-22 – 2022-02-25 (×9): 15 mg via INTRAVENOUS
  Filled 2022-02-22 (×9): qty 1

## 2022-02-22 MED ORDER — DEXAMETHASONE SODIUM PHOSPHATE 10 MG/ML IJ SOLN
INTRAMUSCULAR | Status: DC | PRN
Start: 2022-02-22 — End: 2022-02-22
  Administered 2022-02-22: 10 mg via INTRAVENOUS

## 2022-02-22 MED ORDER — ACETAMINOPHEN 10 MG/ML IV SOLN
INTRAVENOUS | Status: DC | PRN
  Administered 2022-02-22: 1000 mg via INTRAVENOUS

## 2022-02-22 MED ORDER — FENTANYL CITRATE (PF) 250 MCG/5ML IJ SOLN
INTRAMUSCULAR | Status: AC
  Filled 2022-02-22: qty 5

## 2022-02-22 MED ORDER — SUCCINYLCHOLINE CHLORIDE 200 MG/10ML IV SOSY
PREFILLED_SYRINGE | INTRAVENOUS | Status: DC | PRN
Start: 2022-02-22 — End: 2022-02-22
  Administered 2022-02-22: 100 mg via INTRAVENOUS

## 2022-02-22 MED ORDER — HYDROMORPHONE HCL 1 MG/ML IJ SOLN: 0.2500 mg | INTRAMUSCULAR | Status: DC | PRN

## 2022-02-22 MED ORDER — THIAMINE HCL 100 MG/ML IJ SOLN
100.0000 mg | Freq: Every day | INTRAMUSCULAR | Status: DC
  Administered 2022-02-24: 100 mg via INTRAVENOUS
  Filled 2022-02-22: qty 2

## 2022-02-22 MED ORDER — HYDROMORPHONE HCL 1 MG/ML IJ SOLN
0.5000 mg | Freq: Once | INTRAMUSCULAR | Status: AC
  Administered 2022-02-22: 0.5 mg via INTRAVENOUS
  Filled 2022-02-22: qty 1

## 2022-02-22 MED ORDER — ROCURONIUM BROMIDE 100 MG/10ML IV SOLN
INTRAVENOUS | Status: DC | PRN
  Administered 2022-02-22: 50 mg via INTRAVENOUS

## 2022-02-22 MED ORDER — ONDANSETRON HCL 4 MG/2ML IJ SOLN: 4.0000 mg | Freq: Four times a day (QID) | INTRAMUSCULAR | Status: DC | PRN

## 2022-02-22 MED ORDER — TAMSULOSIN HCL 0.4 MG PO CAPS
0.4000 mg | ORAL_CAPSULE | Freq: Every day | ORAL | Status: DC
  Administered 2022-02-23 – 2022-02-24 (×2): 0.4 mg via ORAL
  Filled 2022-02-22 (×2): qty 1

## 2022-02-22 MED ORDER — FENTANYL CITRATE (PF) 100 MCG/2ML IJ SOLN
INTRAMUSCULAR | Status: DC | PRN
  Administered 2022-02-22: 50 ug via INTRAVENOUS
  Administered 2022-02-22 (×4): 25 ug via INTRAVENOUS
  Administered 2022-02-22: 50 ug via INTRAVENOUS

## 2022-02-22 MED ORDER — FOLIC ACID 5 MG/ML IJ SOLN
1.0000 mg | Freq: Every day | INTRAMUSCULAR | Status: DC
  Administered 2022-02-23 – 2022-02-24 (×2): 1 mg via INTRAVENOUS
  Filled 2022-02-22 (×2): qty 0.2

## 2022-02-22 MED ORDER — MORPHINE SULFATE (PF) 4 MG/ML IV SOLN
4.0000 mg | Freq: Once | INTRAVENOUS | Status: AC
  Administered 2022-02-22: 4 mg via INTRAVENOUS
  Filled 2022-02-22: qty 1

## 2022-02-22 MED ORDER — PIPERACILLIN-TAZOBACTAM 3.375 G IVPB
INTRAVENOUS | Status: AC
  Administered 2022-02-22: 3.375 g via INTRAVENOUS
  Filled 2022-02-22: qty 50

## 2022-02-22 MED ORDER — ACETAMINOPHEN 650 MG RE SUPP: 650.0000 mg | Freq: Four times a day (QID) | RECTAL | Status: DC | PRN

## 2022-02-22 MED ORDER — ACETAMINOPHEN 500 MG PO TABS
1000.0000 mg | ORAL_TABLET | Freq: Four times a day (QID) | ORAL | Status: DC
  Administered 2022-02-23 – 2022-02-25 (×6): 1000 mg via ORAL
  Filled 2022-02-22 (×8): qty 2

## 2022-02-22 MED ORDER — KETAMINE HCL 10 MG/ML IJ SOLN
INTRAMUSCULAR | Status: DC | PRN
  Administered 2022-02-22: 40 mg via INTRAVENOUS
  Administered 2022-02-22: 10 mg via INTRAVENOUS

## 2022-02-22 MED ORDER — OXYCODONE HCL 5 MG PO TABS: 5.0000 mg | ORAL_TABLET | ORAL | Status: DC | PRN

## 2022-02-22 MED ORDER — KETAMINE HCL 50 MG/5ML IJ SOSY
PREFILLED_SYRINGE | INTRAMUSCULAR | Status: AC
  Filled 2022-02-22: qty 5

## 2022-02-22 MED ORDER — ACETAMINOPHEN 10 MG/ML IV SOLN: 1000.0000 mg | Freq: Once | INTRAVENOUS | Status: DC | PRN

## 2022-02-22 MED ORDER — NICOTINE 21 MG/24HR TD PT24
21.0000 mg | MEDICATED_PATCH | Freq: Every day | TRANSDERMAL | Status: DC
  Administered 2022-02-23 – 2022-02-25 (×3): 21 mg via TRANSDERMAL
  Filled 2022-02-22 (×3): qty 1

## 2022-02-22 SURGICAL SUPPLY — 55 items
BAG RETRIEVAL 10 (BASKET) ×1
CANNULA REDUC XI 12-8 STAPL (CANNULA) ×2
CANNULA REDUCER 12-8 DVNC XI (CANNULA) ×1 IMPLANT
CATH REDDICK CHOLANGI 4FR 50CM (CATHETERS) IMPLANT
CLIP LIGATING HEMO O LOK GREEN (MISCELLANEOUS) ×2 IMPLANT
DERMABOND ADVANCED (GAUZE/BANDAGES/DRESSINGS) ×1
DERMABOND ADVANCED .7 DNX12 (GAUZE/BANDAGES/DRESSINGS) ×1 IMPLANT
DRAPE ARM DVNC X/XI (DISPOSABLE) ×4 IMPLANT
DRAPE COLUMN DVNC XI (DISPOSABLE) ×1 IMPLANT
DRAPE DA VINCI XI ARM (DISPOSABLE) ×8
DRAPE DA VINCI XI COLUMN (DISPOSABLE) ×2
ELECT CAUTERY BLADE 6.4 (BLADE) ×2 IMPLANT
ELECT REM PT RETURN 9FT ADLT (ELECTROSURGICAL) ×2
ELECTRODE REM PT RTRN 9FT ADLT (ELECTROSURGICAL) ×1 IMPLANT
GLOVE SURG ENC MOIS LTX SZ7 (GLOVE) ×4 IMPLANT
GOWN STRL REUS W/ TWL LRG LVL3 (GOWN DISPOSABLE) ×4 IMPLANT
GOWN STRL REUS W/TWL LRG LVL3 (GOWN DISPOSABLE) ×8
IRRIGATION STRYKERFLOW (MISCELLANEOUS) IMPLANT
IRRIGATOR STRYKERFLOW (MISCELLANEOUS)
IV CATH ANGIO 12GX3 LT BLUE (NEEDLE) IMPLANT
IV NS 1000ML (IV SOLUTION)
IV NS 1000ML BAXH (IV SOLUTION) IMPLANT
KIT PINK PAD W/HEAD ARE REST (MISCELLANEOUS) ×2 IMPLANT
KIT PINK PAD W/HEAD ARM REST (MISCELLANEOUS) ×1 IMPLANT
LABEL OR SOLS (LABEL) ×2 IMPLANT
MANIFOLD NEPTUNE II (INSTRUMENTS) ×2 IMPLANT
NEEDLE HYPO 22GX1.5 SAFETY (NEEDLE) ×2 IMPLANT
NS IRRIG 500ML POUR BTL (IV SOLUTION) ×2 IMPLANT
OBTURATOR OPTICAL STANDARD 8MM (TROCAR) ×2
OBTURATOR OPTICAL STND 8 DVNC (TROCAR) ×1
OBTURATOR OPTICALSTD 8 DVNC (TROCAR) ×1 IMPLANT
PACK LAP CHOLECYSTECTOMY (MISCELLANEOUS) ×2 IMPLANT
PENCIL ELECTRO HAND CTR (MISCELLANEOUS) ×2 IMPLANT
SEAL CANN UNIV 5-8 DVNC XI (MISCELLANEOUS) ×3 IMPLANT
SEAL XI 5MM-8MM UNIVERSAL (MISCELLANEOUS) ×6
SEALER VESSEL DA VINCI XI (MISCELLANEOUS) ×2
SEALER VESSEL EXT DVNC XI (MISCELLANEOUS) IMPLANT
SET TUBE SMOKE EVAC HIGH FLOW (TUBING) ×2 IMPLANT
SOLUTION ELECTROLUBE (MISCELLANEOUS) ×2 IMPLANT
SPIKE FLUID TRANSFER (MISCELLANEOUS) ×2 IMPLANT
SPONGE T-LAP 18X18 ~~LOC~~+RFID (SPONGE) ×2 IMPLANT
SPONGE T-LAP 4X18 ~~LOC~~+RFID (SPONGE) IMPLANT
STAPLER CANNULA SEAL DVNC XI (STAPLE) ×1 IMPLANT
STAPLER CANNULA SEAL XI (STAPLE) ×2
STOPCOCK 3 WAY MALE LL (IV SETS)
STOPCOCK 3WAY MALE LL (IV SETS) IMPLANT
SUT MNCRL AB 4-0 PS2 18 (SUTURE) ×2 IMPLANT
SUT VICRYL 0 AB UR-6 (SUTURE) ×4 IMPLANT
SYR 20ML LL LF (SYRINGE) ×2 IMPLANT
SYR 30ML LL (SYRINGE) ×2 IMPLANT
SYS BAG RETRIEVAL 10MM (BASKET) ×1
SYSTEM BAG RETRIEVAL 10MM (BASKET) ×1 IMPLANT
TAPE TRANSPORE STRL 2 31045 (GAUZE/BANDAGES/DRESSINGS) ×2 IMPLANT
TROCAR BALLN GELPORT 12X130M (ENDOMECHANICALS) ×2 IMPLANT
WATER STERILE IRR 500ML POUR (IV SOLUTION) ×2 IMPLANT

## 2022-02-22 NOTE — Discharge Instructions (Signed)

## 2022-02-22 NOTE — Plan of Care (Signed)
?  Problem: Clinical Measurements: ?Goal: Ability to maintain clinical measurements within normal limits will improve ?Outcome: Progressing ?  ?Problem: Skin Integrity: ?Goal: Risk for impaired skin integrity will decrease ?Outcome: Progressing ?  ?Problem: Clinical Measurements: ?Goal: Postoperative complications will be avoided or minimized ?Outcome: Progressing ?  ?Problem: Skin Integrity: ?Goal: Risk for impaired skin integrity will decrease ?Outcome: Progressing ?  ?

## 2022-02-22 NOTE — Op Note (Signed)
PROCEDURES: ?1. Laparoscopic loop sigmoid colostomy ?2. Laparoscopic takedown of splenic flexure ? ? 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 ?Post-operative Diagnosis: Same  ? ?Surgeon: Marjory Lies Hagen Bohorquez  ? ?Anesthesia: General endotracheal anesthesia ? ?ASA Class: 4 ? ?Surgeon: Caroleen Hamman , MD FACS ? ?Anesthesia: Gen. with endotracheal tube ? ?Findings: large rectal mass fixed to the pelvis. I was able to palpate it on rectal exam and was fixed to the pelvic wall ?Dilated sigmoid colon 8cm ?No evidence of perforation ? ?Estimated Blood Loss: 20cc ?             ?Complications: none        ?       ?Condition: stable ? ?Procedure Details  ?The patient was seen again in the Holding Room. The benefits, complications, treatment options, and expected outcomes were discussed with the patient. The risks of bleeding, infection, recurrence of symptoms, failure to resolve symptoms,  bowel injury, any of which could require further surgery were reviewed with the patient.   The patient was taken to Operating Room, identified as Kyle Larson and the procedure verified.  A Time Out was held and the above information confirmed. ? ?Prior to the induction of general anesthesia, antibiotic prophylaxis was administered. VTE prophylaxis was in place. General endotracheal anesthesia was then administered and tolerated well. After the induction, the abdomen was prepped with Chloraprep and draped in the sterile fashion. The patient was positioned in the supine position. ?I performed a rectal exam with a large rectal mass fixed to the pelvic wall. ?Veress needle was used to insufflate the abdominal cavity at Palmer's point.  Optiview was used to place 8 mm port under direct visualization.  Pneumoperitoneum obtained without evidence of hemodynamic compromise.  There was no evidence of any bowel injuries.   3 8 mm ports were placed under direct visualization. ?Robot was brought to the sterile  field and was stuck in the standard fashion.  We made Sure there was no collision between arms ?Sigmoid colon was dilated up to 8 cm.  Were able to divide the white line of Toldt from lateral to medial and went all the way to the splenic flexure where we took it down with vessel sealer.  We had great mobilization of the sigmoid colon and attention was turned to the pelvis where fakes rectal mass was seen.  There was no evidence of free perforation or purulence.  Point I selected a point within the sigmoid colon and placed at 2 oh a stitch.  Using the PMI suture were able to exteriorize the stitch marked within the abdominal wall.  We remove the needles and all instrumentation. ?Robot was undocked.  All of their ports were removed and a defect was created in the left lower quadrant where the exteriorize silk was coming out.  Fascia was divided in a cruciate form and the sigmoid colon was exteriorized. ?All the skin incisions were closed with 4-0 Monocryl and Dermabond. ?Liposomal Marcaine mixed was injected throughout all the incisions ?Anatomy was performed the sigmoid colon and the sigmoid loop colostomy was matured with multiple 3-0 Vicryl's to achieve a rosebud configuration.  Colostomy appliance was placed. Needle and laparotomy count were correct and there were no immediate complications ? ? ?Caroleen Hamman, MD, FACS ? ?  ?

## 2022-02-22 NOTE — Anesthesia Procedure Notes (Signed)
Procedure Name: Intubation ?Date/Time: 02/22/2022 7:40 PM ?Performed by: Leander Rams, CRNA ?Pre-anesthesia Checklist: Patient identified, Emergency Drugs available, Suction available, Patient being monitored and Timeout performed ?Patient Re-evaluated:Patient Re-evaluated prior to induction ?Oxygen Delivery Method: Circle system utilized ?Preoxygenation: Pre-oxygenation with 100% oxygen ?Induction Type: IV induction ?Laryngoscope Size: McGraph and 3 ?Grade View: Grade I ?Tube type: Oral ?Tube size: 7.0 mm ?Number of attempts: 1 ?Airway Equipment and Method: Stylet ? ? ? ? ?

## 2022-02-22 NOTE — Transfer of Care (Signed)
Immediate Anesthesia Transfer of Care Note ? ?Patient: Kyle Larson ? ?Procedure(s) Performed: XI ROBOTIC [place] ? ?Patient Location: PACU ? ?Anesthesia Type:General ? ?Level of Consciousness: awake ? ?Airway & Oxygen Therapy: Patient Spontanous Breathing ? ?Post-op Assessment: Report given to RN ? ?Post vital signs: stable ? ?Last Vitals:  ?Vitals Value Taken Time  ?BP    ?Temp    ?Pulse 93 02/22/22 2138  ?Resp 16 02/22/22 2138  ?SpO2 96 % 02/22/22 2138  ?Vitals shown include unvalidated device data. ? ?Last Pain:  ?Vitals:  ? 02/22/22 1845  ?TempSrc:   ?PainSc: 10-Worst pain ever  ?   ? ?  ? ?Complications: No notable events documented. ?

## 2022-02-22 NOTE — ED Notes (Signed)
Informed RN bed assigned 

## 2022-02-22 NOTE — ED Provider Notes (Signed)
?[facility] ? ? ? ?CSN: [id number] ?Arrival date & time: 02/22/22  1351 ? ? ?  ? ?History   ?Chief Complaint ?Chief Complaint  ?Patient presents with  ? Abdominal Pain  ? ? ?HPI ?Kyle Larson is a 66 y.o. male presenting for 10 out of 10 abdominal pain.  Patient says he has had abdominal pain for the past 1 week but it has significantly worsened today.  He says the abdominal pain is mostly periumbilical.  He is crying in pain and grasping his abdomen as he talks to me and has partner is also helping to give some the history.  He has apparently been having diarrhea.  Reduced appetite.  No fever, vomiting.  He has apparently passed out twice due to pain today.  Patient has a catheter that he has had for the past couple of weeks.  He says the urine is cloudy but he is not having any urinary symptoms since he had the catheter put in.  Does not believe he has a UTI.  Patient has history of tobacco abuse and alcohol abuse.  Patient says over the past week he has only been drinking 1-2 beers a day but before that he could easily drink 10 or more a day.  History of stroke like symptoms in February of this year.  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  He was last seen by urologist about 2 weeks ago.  No other complaints. ? ?HPI ? ?Past Medical History:  ?Diagnosis Date  ? AC joint dislocation   ? Metatarsal bone fracture   ? 3rd metatarsal , left foot; April 2021  ? Syncope   ? June '22  ? Tobacco abuse   ? ? ?Patient Active Problem List  ? Diagnosis Date Noted  ? Stroke ([place]) 01/06/2022  ? Anxiety 01/06/2022  ? Microcytic anemia 01/06/2022  ? Diarrhea 01/06/2022  ? Chest pain 05/20/2021  ? Chronic hyponatremia 05/20/2021  ? Leukocytosis 05/20/2021  ? Alcohol abuse 05/20/2021  ? Tobacco abuse   ? Syncope 05/19/2021  ? Separation of left acromioclavicular joint 05/21/2016  ? ? ?Past Surgical History:  ?Procedure Laterality Date  ? ACROMIO-CLAVICULAR JOINT REPAIR Left 09/11/2016  ?  Procedure: ACROMIO-CLAVICULAR RECONSTRUCTION;  Surgeon: Corky Mull, MD;  Location: [facility] ORS;  Service: Orthopedics;  Laterality: Left;  clavicle  ? LEFT HEART CATH AND CORONARY ANGIOGRAPHY N/A 05/20/2021  ? Procedure: LEFT HEART CATH AND CORONARY ANGIOGRAPHY with coronary intervention;  Surgeon: [name], MD;  Location: [place] [facility];  Service: Cardiovascular;  Laterality: N/A;  ? MANDIBLE RECONSTRUCTION    ? TONSILLECTOMY    ? AGE 11  ? XI ROBOTIC ASSISTED INGUINAL HERNIA REPAIR WITH MESH Right 03/26/2020  ? Procedure: XI ROBOTIC ASSISTED INGUINAL HERNIA REPAIR WITH MESH;  Surgeon: Herbert Pun, MD;  Location: [facility] ORS;  Service: General;  Laterality: Right;  ? ? ? ? ? ?Home Medications   ? ?Prior to Admission medications   ?Medication Sig Start Date End Date Taking? Authorizing Provider  ?aspirin EC 81 MG EC tablet Take 1 tablet (81 mg total) by mouth daily. Swallow whole. 01/08/22  Yes Sharen Hones, MD  ?atorvastatin (LIPITOR) 40 MG tablet Take 1 tablet (40 mg total) by mouth daily. 01/08/22  Yes Sharen Hones, MD  ?clopidogrel (PLAVIX) 75 MG tablet Take 1 tablet (75 mg total) by mouth daily. 01/08/22  Yes Sharen Hones, MD  ?Multiple Vitamins-Minerals (MULTIVITAMIN WITH MINERALS) tablet Take 1 tablet by mouth  daily.   Yes [provider]  ?tamsulosin (FLOMAX) 0.4 MG CAPS capsule Take 1 capsule (0.4 mg total) by mouth daily after supper. 01/02/22  Yes Billey Co, MD  ?ferrous sulfate 325 (65 FE) MG tablet Take 1 tablet (325 mg total) by mouth daily. 01/07/22 02/06/22  Sharen Hones, MD  ? ? ?Family History ?Family History  ?Problem Relation Age of Onset  ? Diabetes Mother   ? Prostate cancer Father   ? ? ?Social History ?Social History  ? ?Tobacco Use  ? Smoking status: Some Days  ?  Packs/day: 1.00  ?  Years: 15.00  ?  Pack years: 15.00  ?  Types: Cigarettes  ?  Passive exposure: Current  ? Smokeless tobacco: Never  ?Vaping Use  ? Vaping Use: Never used  ?Substance Use Topics  ? Alcohol  use: Yes  ?  Alcohol/week: 12.0 standard drinks  ?  Types: 12 Cans of beer per week  ? Drug use: No  ? ? ? ?Allergies   ?Patient has no known allergies. ? ? ?Review of Systems ?Review of Systems  ?Constitutional:  Positive for appetite change and fatigue. Negative for fever.  ?Respiratory:  Negative for shortness of breath.   ?Cardiovascular:  Negative for chest pain.  ?Gastrointestinal:  Positive for abdominal pain, diarrhea and nausea. Negative for blood in stool and constipation.  ?Genitourinary:  Positive for difficulty urinating (has catheter). Negative for decreased urine volume, dysuria, flank pain, frequency, hematuria and testicular pain.  ?Neurological:  Negative for weakness.  ? ? ?Physical Exam ?Triage Vital Signs ?ED Triage Vitals [02/22/22 1401]  ?Enc Vitals Group  ?   BP   ?   Pulse   ?   Resp   ?   Temp   ?   Temp src   ?   SpO2   ?   Weight 133 lb (60.3 kg)  ?   Height 6' (1.829 m)  ?   Head Circumference   ?   Peak Flow   ?   Pain Score 10  ?   Pain Loc   ?   Pain Edu?   ?   Excl. in [place]?   ? ?No data found. ? ?Updated Vital Signs ?BP 113/76 (BP Location: Left Arm)   Pulse (!) 109   Temp 97.9 ?F (36.6 ?C) (Oral)   Resp 18   Ht 6' (1.829 m)   Wt 133 lb (60.3 kg)   SpO2 100%   BMI 18.04 kg/m?  ? ?   ? ?Physical Exam ?Vitals and nursing note reviewed.  ?Constitutional:   ?   General: He is in acute distress (patient is crying out in pain and grasping his abdomen).  ?   Appearance: Normal appearance. He is well-developed. He is ill-appearing.  ?HENT:  ?   Head: Normocephalic and atraumatic.  ?   Mouth/Throat:  ?   Mouth: Mucous membranes are moist.  ?   Pharynx: Oropharynx is clear.  ?Eyes:  ?   General: No scleral icterus. ?   Conjunctiva/sclera: Conjunctivae normal.  ?Cardiovascular:  ?   Rate and Rhythm: Regular rhythm. Tachycardia present.  ?   Heart sounds: Normal heart sounds.  ?Pulmonary:  ?   Effort: Pulmonary effort is normal. No respiratory distress.  ?   Breath sounds: Normal breath  sounds.  ?Abdominal:  ?   General: Abdomen is flat. Bowel sounds are normal.  ?   Palpations: Abdomen is soft.  ?   Tenderness:  There is generalized abdominal tenderness. There is guarding and rebound. Positive signs include Murphy's sign and McBurney's sign.  ?Musculoskeletal:  ?   Cervical back: Neck supple.  ?Skin: ?   General: Skin is warm and dry.  ?   Capillary Refill: Capillary refill takes less than 2 seconds.  ?Neurological:  ?   General: No focal deficit present.  ?   Mental Status: He is alert. Mental status is at baseline.  ?   Motor: No weakness.  ?   Coordination: Coordination normal.  ?   Gait: Gait normal.  ?Psychiatric:     ?   Mood and Affect: Mood normal.     ?   Behavior: Behavior normal.     ?   Thought Content: Thought content normal.  ? ? ? ?UC Treatments / Results  ?Labs ?(all labs ordered are listed, but only abnormal results are displayed) ?Labs Reviewed - No data to display ? ?EKG ? ? ?Radiology ?No results found. ? ?Procedures ?Procedures (including critical care time) ? ?Medications Ordered in UC ?Medications - No data to display ? ?Initial Impression / Assessment and Plan / UC Course  ?I have reviewed the triage vital signs and the nursing notes. ? ?Pertinent labs & imaging results that were available during my care of the patient were reviewed by me and considered in my medical decision making (see chart for details). ? ?66 year old male presenting for severe generalized abdominal pain which has worsened over the past week and especially the past day.  It is associated with diarrhea.  No vomiting or urinary symptoms per patient.  He does have a history of BPH with obstruction and has a Foley catheter that is been present for the past 2 weeks but denies any urinary symptoms at this time.  Patient is literally screaming in pain and grasping his abdomen.  Abdomen is flat, soft and has generalized tenderness to palpation and guarding throughout.  Chest clear to auscultation.  Advised  patient that since his pain is so severe he really needs work-up in the emergency department to include labs and CT of his abdomen pelvis.  I suspect that he is having acute abdomen the way that he is behaving in the clini

## 2022-02-22 NOTE — H&P (Signed)
?History and Physical  ? ? ?Patient: Kyle Larson XTG:[id number] DOB: July 11, 1956 ?DOA: 02/22/2022 ?DOS: the patient was seen and examined on 02/22/2022 ?PCP: [name], MD  ?Patient coming from: Home ? ?Chief Complaint:  ?Chief Complaint  ?Patient presents with  ? Abdominal Pain  ? ?HPI: Kyle Larson is a 66 y.o. male with medical history significant of CAD, stroke coming in with severe abdominal pain going on for 1 week.  He has had diarrhea for a while.  He has been feeling tired.  He has been nauseous.  He cannot eat a lot.  He has had some weight loss.  No vomiting.  The abdominal pain would come and go for a while now but not as severe as this past week.  This past weeks 10 out of 10 abdominal pain.  In the ER he was found to have a CT scan showing a large necrotic rectal mass measuring 8.8 x 7.2 x 8.9 cm with partial obstruction and retention of stool in the colon.  Also showing urinary bladder wall thickening with hydronephrosis likely secondary to direct tumor invasion of the large rectal mass.  Also seen are multiple small nodes with advanced degenerative disc disease.  Could not rule out metastatic disease.  Hospitalist services were contacted for further evaluation.  Case discussed with Dr. Dahlia Byes general surgery.  ER physician also consulted urology. ? ?Review of Systems: Review of Systems  ?Constitutional:  Positive for malaise/fatigue and weight loss. Negative for chills and fever.  ?HENT:  Negative for sore throat.   ?Eyes:  Negative for blurred vision.  ?Respiratory:  Negative for cough.   ?Cardiovascular:  Negative for chest pain.  ?Gastrointestinal:  Positive for abdominal pain, diarrhea and nausea. Negative for vomiting.  ?Genitourinary:  Negative for dysuria.  ?Musculoskeletal:  Positive for joint pain.  ?Skin:  Negative for rash.  ?Neurological:  Negative for dizziness.  ?Psychiatric/Behavioral:  Negative for depression.    ?Past Medical History:  ?Diagnosis Date  ? AC joint dislocation    ? CAD (coronary artery disease)   ? CVA [facility] vascular accident[facility])   ? Metatarsal bone fracture   ? 3rd metatarsal , left foot; April 2021  ? Syncope   ? June '22  ? Tobacco abuse   ? ?Past Surgical History:  ?Procedure Laterality Date  ? ACROMIO-CLAVICULAR JOINT REPAIR Left 09/11/2016  ? Procedure: ACROMIO-CLAVICULAR RECONSTRUCTION;  Surgeon: Corky Mull, MD;  Location: [facility] ORS;  Service: Orthopedics;  Laterality: Left;  clavicle  ? LEFT HEART CATH AND CORONARY ANGIOGRAPHY N/A 05/20/2021  ? Procedure: LEFT HEART CATH AND CORONARY ANGIOGRAPHY with coronary intervention;  Surgeon: [name], MD;  Location: [place] [facility];  Service: Cardiovascular;  Laterality: N/A;  ? MANDIBLE RECONSTRUCTION    ? TONSILLECTOMY    ? AGE 25  ? XI ROBOTIC ASSISTED INGUINAL HERNIA REPAIR WITH MESH Right 03/26/2020  ? Procedure: XI ROBOTIC ASSISTED INGUINAL HERNIA REPAIR WITH MESH;  Surgeon: Herbert Pun, MD;  Location: [facility] ORS;  Service: General;  Laterality: Right;  ? ?Social History:  reports that he has been smoking cigarettes. He has a 3.75 pack-year smoking history. He has been exposed to tobacco smoke. He has never used smokeless tobacco. He reports current alcohol use of about 42.0 standard drinks per week. He reports that he does not use drugs. ? ?No Known Allergies ? ?Family History  ?Problem Relation Age of Onset  ? Diabetes Mother   ? Prostate cancer Father   ? ? ?  Prior to Admission medications   ?Medication Sig Start Date End Date Taking? Authorizing Provider  ?aspirin EC 81 MG EC tablet Take 1 tablet (81 mg total) by mouth daily. Swallow whole. 01/08/22  Yes Sharen Hones, MD  ?nicotine (NICODERM CQ - DOSED IN MG/24 HOURS) 21 mg/24hr patch Place 21 mg onto the skin daily. 02/14/22  Yes [provider]  ?atorvastatin (LIPITOR) 40 MG tablet Take 1 tablet (40 mg total) by mouth daily. 01/08/22   Sharen Hones, MD  ?clopidogrel (PLAVIX) 75 MG tablet Take 1 tablet (75 mg total) by mouth daily.  01/08/22   Sharen Hones, MD  ?ferrous sulfate 325 (65 FE) MG tablet Take 1 tablet (325 mg total) by mouth daily. 01/07/22 02/06/22  Sharen Hones, MD  ?Multiple Vitamins-Minerals (MULTIVITAMIN WITH MINERALS) tablet Take 1 tablet by mouth daily.    [provider]  ?sotalol (BETAPACE) 80 MG tablet Take 80 mg by mouth daily. ?Patient not taking: Reported on 02/22/2022 02/14/22   [provider]  ?tamsulosin (FLOMAX) 0.4 MG CAPS capsule Take 1 capsule (0.4 mg total) by mouth daily after supper. 01/02/22   Billey Co, MD  ?Patient not taking Betapace. ? ?Physical Exam: ?Vitals:  ? 02/22/22 1456 02/22/22 1630  ?BP: 110/75 (!) 134/94  ?Pulse: (!) 119 91  ?Resp: 18 18  ?Temp: 98.3 ?F (36.8 ?C)   ?TempSrc: Oral   ?SpO2: 98% 100%  ?Weight: 61.2 kg   ?Height: 6' (1.829 m)   ? ?Physical Exam ?HENT:  ?   Head: Normocephalic.  ?   Mouth/Throat:  ?   Pharynx: No oropharyngeal exudate.  ?Eyes:  ?   General: Lids are normal.  ?   Conjunctiva/sclera: Conjunctivae normal.  ?Cardiovascular:  ?   Rate and Rhythm: Normal rate and regular rhythm.  ?   Heart sounds: Normal heart sounds, S1 normal and S2 normal.  ?Pulmonary:  ?   Breath sounds: Normal breath sounds. No decreased breath sounds, wheezing, rhonchi or rales.  ?Abdominal:  ?   Palpations: Abdomen is soft.  ?   Tenderness: There is abdominal tenderness in the suprapubic area and left lower quadrant.  ?Musculoskeletal:  ?   Right lower leg: No swelling.  ?   Left lower leg: No swelling.  ?Skin: ?   General: Skin is warm.  ?   Findings: No rash.  ?Neurological:  ?   Mental Status: He is alert and oriented to person, place, and time.  ?  ?Data Reviewed: ?CT of the abdomen pelvis reviewed by me and with Dr. Dahlia Byes.  Sodium 128, creatinine 0.56, white blood cell count 21.1, hemoglobin 9.9, platelet count 727 ? ?Assessment and Plan: ?1.  Obstructing large necrotic rectal mass.  Case discussed with general surgery, patient and patient's wife.  A diverting colostomy will  be performed tonight to avoid perforation and sepsis.  Hopefully a tissue sample of the necrotic mass can be taken.  Empiric Zosyn prescribed.  High risk of bleeding being that the patient is on aspirin and Plavix.  High risk of perforation if surgery not done.  High risk of complications.  We will add on a CEA. ?2.  Urinary bladder obstruction with hydronephrosis.  ER physician consulted Dr. Bernardo Heater urology.  Patient may end up needing nephrostomy tubes. ?3.  Alcohol abuse.  We will give a stat dose of IV thiamine and put on alcohol withdrawal protocol. ?4.  History of CAD and CVA.  Holding aspirin and Plavix for surgery.  Holding Lipitor until  able to eat. ?5.  Tobacco abuse already on nicotine patch.  Will continue. ?6.  Full CODE STATUS confirmed. ?7.  BPH on Flomax will start once able to take. ? ? ? Advance Care Planning:   Code Status: Full Code  ? ?Consults: General surgery and urology so far. ? ?Family Communication: Spoke with wife at the bedside ? ?Severity of Illness: ?The appropriate patient status for this patient is INPATIENT. Inpatient status is judged to be reasonable and necessary in order to provide the required intensity of service to ensure the patient's safety. The patient's presenting symptoms, physical exam findings, and initial radiographic and laboratory data in the context of their chronic comorbidities is felt to place them at high risk for further clinical deterioration. Furthermore, it is not anticipated that the patient will be medically stable for discharge from the hospital within 2 midnights of admission.  ? ?* I certify that at the point of admission it is my clinical judgment that the patient will require inpatient hospital care spanning beyond 2 midnights from the point of admission due to high intensity of service, high risk for further deterioration and high frequency of surveillance required.* ? ?Author: ?Loletha Grayer, MD ?02/22/2022 6:14 PM ? ?For on call review  [email or web address].  ?

## 2022-02-22 NOTE — Consult Note (Signed)
Patient ID: Kyle Larson, male   DOB: 1956/06/24, 66 y.o.   MRN: [id number] ? ?HPI ?Kyle Larson is a 66 y.o. male in consultation at the request of Dr. Kerman Passey present with subacute onset of abdominal pain.  History is taken mostly from the wife since the patient is moaning in pain and seems to be confused.  The per the wife he reports failure to thrive and weakness for about a week.  He usually does not go to doctors.  Denies any hematochezia any melena.  He does endorse significant weight loss.  The abdominal pain is being getting worse for the last week or so today severe sharp and constant.  Worsening with movements.  He did have diarrhea nausea as well as decreased appetite. ?Does have a history of coronary artery disease and had a stent placed several years ago.  Currently is on dual antiplatelet therapy to include Plavix and aspirin.  He did have a stroke a month and a half ago. ?Significant history of colorectal cancer. ?Appropriate work-up in the ER revealed very large necrotic rectal mass measuring 9 x 9 cm.  There is retained stone and there is distention of the colon wall.  He also had hydronephrosis from tumor invasion into the bladder. ?Has never had a colonoscopy before. ? ?HPI ? ?Past Medical History:  ?Diagnosis Date  ? AC joint dislocation   ? Metatarsal bone fracture   ? 3rd metatarsal , left foot; April 2021  ? Syncope   ? June '22  ? Tobacco abuse   ? ? ?Past Surgical History:  ?Procedure Laterality Date  ? ACROMIO-CLAVICULAR JOINT REPAIR Left 09/11/2016  ? Procedure: ACROMIO-CLAVICULAR RECONSTRUCTION;  Surgeon: Corky Mull, MD;  Location: [facility] OR[facility];  Service: Orthopedics;  Laterality: Left;  clavicle  ? LEFT HEART CATH AND CORONARY ANGIOGRAPHY N/A 05/20/2021  ? Procedure: LEFT HEART CATH AND CORONARY ANGIOGRAPHY with coronary intervention;  Surgeon: [name], MD;  Location: [place] [facility];  Service: Cardiovascular;  Laterality: N/A;  ? MANDIBLE RECONSTRUCTION    ?  TONSILLECTOMY    ? AGE 36  ? XI ROBOTIC ASSISTED INGUINAL HERNIA REPAIR WITH MESH Right 03/26/2020  ? Procedure: XI ROBOTIC ASSISTED INGUINAL HERNIA REPAIR WITH MESH;  Surgeon: Herbert Pun, MD;  Location: [facility] ORS;  Service: General;  Laterality: Right;  ? ? ?Family History  ?Problem Relation Age of Onset  ? Diabetes Mother   ? Prostate cancer Father   ? ? ?Social History ?Social History  ? ?Tobacco Use  ? Smoking status: Every Day  ?  Packs/day: 0.50  ?  Years: 15.00  ?  Pack years: 7.50  ?  Types: Cigarettes  ?  Passive exposure: Current  ? Smokeless tobacco: Never  ?Vaping Use  ? Vaping Use: Never used  ?Substance Use Topics  ? Alcohol use: Yes  ?  Alcohol/week: 42.0 standard drinks  ?  Types: 42 Cans of beer per week  ?  Comment: 6pack/day  ? Drug use: No  ? ? ?No Known Allergies ? ?Current Facility-Administered Medications  ?Medication Dose Route Frequency Provider Last Rate Last Admin  ? 0.9 %  sodium chloride infusion  10 mL/hr Intravenous Once Harvest Dark, MD      ? acetaminophen (TYLENOL) tablet 650 mg  650 mg Oral Q6H PRN Wieting, Britt, MD      ? Or  ? acetaminophen (TYLENOL) suppository 650 mg  650 mg Rectal Q6H PRN Loletha Grayer, MD      ? HYDROmorphone (  DILAUDID) injection 1 mg  1 mg Intravenous Q2H PRN Wieting, Ted, MD      ? ondansetron (ZOFRAN) tablet 4 mg  4 mg Oral Q6H PRN Wieting, Key, MD      ? Or  ? ondansetron (ZOFRAN) injection 4 mg  4 mg Intravenous Q6H PRN Loletha Grayer, MD      ? ?Current Outpatient Medications  ?Medication Sig Dispense Refill  ? aspirin EC 81 MG EC tablet Take 1 tablet (81 mg total) by mouth daily. Swallow whole. 30 tablet 0  ? nicotine (NICODERM CQ - DOSED IN MG/24 HOURS) 21 mg/24hr patch Place 21 mg onto the skin daily.    ? atorvastatin (LIPITOR) 40 MG tablet Take 1 tablet (40 mg total) by mouth daily. 30 tablet 0  ? clopidogrel (PLAVIX) 75 MG tablet Take 1 tablet (75 mg total) by mouth daily. 30 tablet 0  ? ferrous sulfate 325 (65 FE) MG  tablet Take 1 tablet (325 mg total) by mouth daily. 30 tablet 0  ? Multiple Vitamins-Minerals (MULTIVITAMIN WITH MINERALS) tablet Take 1 tablet by mouth daily.    ? sotalol (BETAPACE) 80 MG tablet Take 80 mg by mouth daily. (Patient not taking: Reported on 02/22/2022)    ? tamsulosin (FLOMAX) 0.4 MG CAPS capsule Take 1 capsule (0.4 mg total) by mouth daily after supper. 30 capsule 0  ? ? ? ?Review of Systems ?Full ROS  was asked and was negative except for the information on the HPI ? ?Physical Exam ?Blood pressure (!) 134/94, pulse 91, temperature 98.3 ?F (36.8 ?C), temperature source Oral, resp. rate 18, height 6' (1.829 m), weight 61.2 kg, SpO2 100 %. ?CONSTITUTIONAL: Chronically ill.  Moaning in pain.  Malnourished ?EYES: Pupils are equal, round, and reactive to light, Sclera are non-icteric. ?EARS, NOSE, MOUTH AND THROAT: The oropharynx is clear. The oral mucosa is dry. Hearing is intact to voice. ?LYMPH NODES:  Lymph nodes in the neck are normal. ?RESPIRATORY:  Lungs are clear. There is normal respiratory effort, with equal breath sounds bilaterally, and without pathologic use of accessory muscles. ?CARDIOVASCULAR: Heart is regular without murmurs, gallops, or rubs. ?GI: The abdomen is tender to palpation with rebound tenderness.  There is no obvious masses.  GU: Rectal deferred.   ?MUSCULOSKELETAL: Normal muscle strength and tone. No cyanosis or edema.   ?SKIN: Turgor is good and there are no pathologic skin lesions or ulcers. ?NEUROLOGIC: Motor and sensation is grossly normal. Cranial nerves are grossly intact. ?PSYCH:  Oriented to person, place and time. Affect is normal. ? ?Data Reviewed ? ?I have personally reviewed the patient's imaging, laboratory findings and medical records.   ? ?Assessment/Plan ?66 year old male with acute abdomen and obstructed rectal mass, his abdominal exam is very concerning and is worse than the CT scan.  For this reason I do think that we need to move quickly to the operating  room.  I had an extensive discussion with the wife and the patient regarding his disease process.  Unfortunately his rectal cancer is advanced and seems to be metastatic very large.  I do not think that I will be able to resect it and I will have to do a palliative diverting colostomy. ?She discussed with them in detail.  Risks, benefits and possible include include but not limited to: Bleeding, infection injury to adjacent structures.  Also discussed with her that if we do not do anything he will likely perforate and die within a couple days.  This is palliative and this  will buy him some time.  He will eventually need percutaneous for ostomy tube given adjacent invasion to the ureters.  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  He will need oncology work-up as well as tissue diagnosis we will try to do a tissue diagnosis.  They understand that he is at high risk for perioperative morbidity and mortality but unfortunately do not have much alternatives.  He is on Plavix and aspirin and has a recent stroke which increases perioperative risk of bleeding and also having a stroke.  He is currently full code.  Please note that I spent greater than 75 minutes in this encounter including coordination of his care, personally reviewing images, counseling the patient and performing appropriate and documentation ? ?Caroleen Hamman, MD FACS ?General Surgeon ?02/22/2022, 5:49 PM ? ?  ?

## 2022-02-22 NOTE — Progress Notes (Signed)
Per Dr. Dahlia Byes NG tube was removed in PACU. No longer needed. ?

## 2022-02-22 NOTE — Anesthesia Preprocedure Evaluation (Addendum)
Anesthesia Evaluation  ? ?Patient awake and Patient confused ? ? ? ?Reviewed: ?Allergy & Precautions, NPO status , Patient's Chart, lab work & pertinent test results ? ?Airway ?Mallampati: III ? ?TM Distance: >3 FB ?Neck ROM: full ? ? ? Dental ? ?(+) Chipped, Poor Dentition ?  ?Pulmonary ?Current Smoker,  ?Chronic sinusitis ? ?Emphysema ?  ?Pulmonary exam normal ? ? ? ? ? ? ? Cardiovascular ?Exercise Tolerance: Poor ?+ CAD and + Cardiac Stents (years ago)  ?Normal cardiovascular exam ? ?echocardiogram performed in 04/2021, ejection fraction was normal, no valvular abnormality. ? ?CT angiogram of the neck and head did not show any vascular occlusion ?  ?Neuro/Psych ?CVA approximately 1.5 months ago.  ?-Acute embolic strokes in right greater than left MCA territories. ?-MRI of the brain showed bilateral MCA multiple small strokes ?H/o syncope ?CVA negative psych ROS  ? GI/Hepatic ?(+)  ?  ? substance abuse ? alcohol use, Drinking 1-2 beers a day  ?history of colorectal cancer now with revealed very large necrotic rectal mass measuring 9 x 9 cm and hydronephrosis from tumor invasion. Plan is for a palliative diverting colostomy ?  ?Endo/Other  ?chronic hyponatremia ? Renal/GU ?  ? ? BPH with obstruction ? ?  ?Musculoskeletal ? ? Abdominal ?(+)  ?Abdomen: tender. ? ?Non-distended. NGtube in place  ?Peds ? Hematology ? ?(+) Blood dyscrasia, anemia , Hgb 9.9 ?Thrombocytosis   ?Anesthesia Other Findings ?Past Medical History: ?No date: [facility] joint dislocation ?No date: CAD (coronary artery disease) ?No date: CVA (cerebral vascular accident) ([place]) ?No date: Metatarsal bone fracture ?    Comment:  3rd metatarsal , left foot; April 2021 ?No date: Syncope ?    Comment:  June '22 ?No date: Tobacco abuse ? ?Past Surgical History: ?09/11/2016: ACROMIO-CLAVICULAR JOINT REPAIR; Left ?    Comment:  Procedure: ACROMIO-CLAVICULAR RECONSTRUCTION;  Surgeon:  ?             Corky Mull, MD;  Location: [facility]  ORS;  Service:  ?             Orthopedics;  Laterality: Left;  clavicle ?05/20/2021: LEFT HEART CATH AND CORONARY ANGIOGRAPHY; N/A ?    Comment:  Procedure: LEFT HEART CATH AND CORONARY ANGIOGRAPHY with ?             coronary intervention;  Surgeon: [name], MD;   ?             Location: [place] [facility];  Service: Cardiovascular; ?             Laterality: N/A; ?No date: MANDIBLE RECONSTRUCTION ?No date: TONSILLECTOMY ?    Comment:  AGE 66 ?03/26/2020: XI ROBOTIC ASSISTED INGUINAL HERNIA REPAIR WITH MESH; Right ?    Comment:  Procedure: XI ROBOTIC ASSISTED INGUINAL HERNIA REPAIR  ?             WITH MESH;  Surgeon: Herbert Pun, MD;   ?             Location: [facility] ORS;  Service: General;  Laterality:  ?             Right; ? ?BMI   ? Body Mass Index: 18.31 kg/m?  ?  ? ? Reproductive/Obstetrics ?negative OB ROS ? ?  ? ? ? ? ? ? ? ? ? ? ? ? ? ?  ?  ? ? ? ? ? ? ?Anesthesia Physical ?Anesthesia Plan ? ?ASA: 4 and emergent ? ?Anesthesia Plan: General ETT  ? ?  Post-op Pain Management: Regional block* and Ofirmev IV (intra-op)*  ? ?Induction: Intravenous and Rapid sequence ? ?PONV Risk Score and Plan: Ondansetron, Dexamethasone and Treatment may vary due to age or medical condition ? ?Airway Management Planned: Oral ETT ? ?Additional Equipment:  ? ?Intra-op Plan:  ? ?Post-operative Plan: Extubation in OR ? ?Informed Consent: I have reviewed the patients History and Physical, chart, labs and discussed the procedure including the risks, benefits and alternatives for the proposed anesthesia with the patient or authorized representative who has indicated his/her understanding and acceptance.  ? ? ? ?Dental advisory given and Consent reviewed with POA ? ?Plan Discussed with: Anesthesiologist, CRNA and Surgeon ? ?Anesthesia Plan Comments: (Epigastric pain in pre-op evaluated by EKG with poor results due to pt shaking. Fentanyl 71mg administered and patient desaturated to the low 80's. His respiratory rate was  around 12 at that time. Lungs were clear at this time to auscultation. Kinmundy was placed.)  ? ? ? ?Anesthesia Quick Evaluation ? ?

## 2022-02-22 NOTE — Progress Notes (Signed)
Pharmacy Antibiotic Note ? ?Kyle Larson is a 66 y.o. male admitted on 02/22/2022.  Pharmacy has been consulted for Zosyn dosing for intra-abdominal infection. ? ?Plan: ?Zosyn 3.375g IV q8h (4 hour infusion). ? ?Height: 6' (182.9 cm) ?Weight: 61.2 kg (135 lb) ?IBW/kg (Calculated) : 77.6 ? ?Temp (24hrs), Avg:98.1 ?F (36.7 ?C), Min:97.9 ?F (36.6 ?C), Max:98.3 ?F (36.8 ?C) ? ?Recent Labs  ?Lab 02/22/22 ?1458  ?WBC 21.1*  ?CREATININE 0.56*  ?  ?Estimated Creatinine Clearance: 79.7 mL/min (A) (by C-G formula based on SCr of 0.56 mg/dL (L)).   ? ?No Known Allergies ? ?Antimicrobials this admission: ?4/1 Zosyn >>  ? ? ?Microbiology results: ? ? ?Thank you for allowing pharmacy to be a part of this patient?s care. ? ?Kyle Larson ?02/22/2022 6:13 PM ? ?

## 2022-02-22 NOTE — ED Triage Notes (Signed)
Pt c/o abdominal pain x1-2weeks. Pt states that he almost passed out twice due to the pain.  ? ?Pt states that he is having diarrhea. Pt wife is concerned about his gallbladder.  ? ?Pt states that his urine is cloudy ?

## 2022-02-22 NOTE — Consult Note (Signed)
I was contacted by Dr.Paduchowski who stated Dr. Earleen Newport asked him to reach out regarding urologic CT findings ? ?He has a large, necrotic rectal mass and is scheduled for a diverting colostomy this evening by Dr. Dahlia Byes.  He has mild bilateral hydronephrosis L>R most likely secondary to tumor obstruction.  Markedly thickened bladder wall.  No evidence of ureteral involvement or compression of the extravesical ureters but may have compression/invasion of the bladder base causing obstruction ? ?Since he has a normal creatinine and there may be bladder involvement with intramural obstruction of the ureters would not recommend ureteral stenting at the time of his colostomy. ? ?Percutaneous nephrostomy tubes recommended if he begins to show AKI secondary to obstruction. ? ? ?John Giovanni, MD ? ? ?

## 2022-02-22 NOTE — ED Triage Notes (Signed)
Patient c/o abdominal pain that started 0200 Friday 02/21/22. Reports diarrhea X1 week. Denies N/V ?

## 2022-02-22 NOTE — ED Provider Notes (Signed)
? ?[facility] ?Provider Note ? ? ? Event Date/Time  ? First MD Initiated Contact with Patient 02/22/22 1508   ?  (approximate) ? ?History  ? ?Chief Complaint: Abdominal Pain ? ?HPI ? ?Kyle Larson is a 66 y.o. male patient presents emergency department for diffuse abdominal pain.  According to the patient and daughter for the past 5 or 6 days patient has been experiencing diffuse pain throughout the abdomen along with occasional episodes of nausea and frequent episodes of diarrhea.  Has not actually vomited per daughter but has felt very nauseated.  No urinary symptoms.  No black or bloody stool.  No known fever.  Daughter states the pain was intermittent at first but has worsening considerably.  Currently patient is moaning in bed holding the abdomen and appears to be in moderate distress due to pain. ? ?Physical Exam  ? ?Triage Vital Signs: ?ED Triage Vitals [02/22/22 1456]  ?Enc Vitals Group  ?   BP 110/75  ?   Pulse Rate (!) 119  ?   Resp 18  ?   Temp 98.3 ?F (36.8 ?C)  ?   Temp Source Oral  ?   SpO2 98 %  ?   Weight 135 lb (61.2 kg)  ?   Height 6' (1.829 m)  ?   Head Circumference   ?   Peak Flow   ?   Pain Score 10  ?   Pain Loc   ?   Pain Edu?   ?   Excl. in [place]?   ? ? ?Most recent vital signs: ?Vitals:  ? 02/22/22 1456  ?BP: 110/75  ?Pulse: (!) 119  ?Resp: 18  ?Temp: 98.3 ?F (36.8 ?C)  ?SpO2: 98%  ? ? ?General: Awake, mild to moderate distress holding his abdomen moaning.  Has difficulty answering most questions due to pain. ?CV:  Good peripheral perfusion.  Regular rhythm rate around 100 to 120 bpm. ?Resp:  Normal effort.  Equal breath sounds bilaterally.  No obvious wheeze rales or rhonchi. ?Abd:  No distention.  Soft, moderate diffuse tenderness.  No focal area of tenderness identified.  Mild guarding. ? ? ? ?ED Results / Procedures / Treatments  ? ? ?RADIOLOGY ? ?I personally reviewed the CT images appears to have a large mass versus fecal impaction in the rectum.  Leading to  bowel obstruction. ?Radiology has called me regarding the CT findings of a large rectal mass leading to bowel obstruction.  Awaiting final read. ? ? ?MEDICATIONS ORDERED IN ED: ?Medications  ?morphine (PF) 4 MG/ML injection 4 mg (has no administration in time range)  ?ondansetron (ZOFRAN) injection 4 mg (has no administration in time range)  ?sodium chloride 0.9 % bolus 1,000 mL (has no administration in time range)  ? ? ? ?IMPRESSION / MDM / ASSESSMENT AND PLAN / ED COURSE  ?I reviewed the triage vital signs and the nursing notes. ? ?Patient presents to the emergency department for diffuse abdominal pain nausea and diarrhea.  Differential is quite broad but would include infectious etiologies such as diverticulitis, colitis, biliary pathology, appendicitis, UTI pyelonephritis, kidney stone.  We will check labs obtain a urine get a CT scan we will treat his pain and his nausea and IV hydrate while awaiting results. ? ?Patient is on his third dose of pain medication continues to be uncomfortable.  Chemistry is resulting showing hyponatremia which is largely unchanged from baseline.  Creatinine is normal.  Lipase normal.  CBC shows significant leukocytosis of 21,000.  Urinalysis does show leukocytes.  We will send a culture.  Patient's CT scan appears to show a large rectal mass.  I spoke to radiology regarding this a confirmed large rectal mass leading to bowel obstruction.  We will place an NG tube.  I spoke to Dr. Dahlia Byes of surgery who is currently in the operating room but will be down shortly to speak to the patient for anticipated surgery tonight.  I spoke to patient and family regarding this they are agreeable.  I have consented them for perioperative blood products if needed.  We will dose IV Zosyn per surgery.  We will check a COVID swab.  We will continue to IV hydrate and treat pain.  Patient is agreeable to this plan of care. ? ?FINAL CLINICAL IMPRESSION(S) / ED DIAGNOSES  ? ?Rectal mass ?Bowel  obstruction ? ?Note:  This document was prepared using Dragon voice recognition software and may include unintentional dictation errors. ?  ?Harvest Dark, MD ?02/22/22 1640 ? ?

## 2022-02-22 NOTE — ED Notes (Signed)
Patient is being discharged from the Urgent Care and sent to the Emergency Department via POV . Per Jerene Pitch, [place], patient is in need of higher level of care due to Abdominal Pain. Patient is aware and verbalizes understanding of plan of care.  ?Vitals:  ? 02/22/22 1402  ?BP: 113/76  ?Pulse: (!) 109  ?Resp: 18  ?Temp: 97.9 ?F (36.6 ?C)  ?SpO2: 100%  ?  ?

## 2022-02-23 ENCOUNTER — Inpatient Hospital Stay: Payer: Medicare Other

## 2022-02-23 DIAGNOSIS — K6389 Other specified diseases of intestine: Secondary | ICD-10-CM | POA: Diagnosis not present

## 2022-02-23 DIAGNOSIS — F1010 Alcohol abuse, uncomplicated: Secondary | ICD-10-CM | POA: Diagnosis not present

## 2022-02-23 DIAGNOSIS — D500 Iron deficiency anemia secondary to blood loss (chronic): Secondary | ICD-10-CM

## 2022-02-23 DIAGNOSIS — D75839 Thrombocytosis, unspecified: Secondary | ICD-10-CM

## 2022-02-23 DIAGNOSIS — I639 Cerebral infarction, unspecified: Secondary | ICD-10-CM | POA: Diagnosis not present

## 2022-02-23 DIAGNOSIS — E871 Hypo-osmolality and hyponatremia: Secondary | ICD-10-CM

## 2022-02-23 DIAGNOSIS — N1330 Unspecified hydronephrosis: Secondary | ICD-10-CM | POA: Diagnosis not present

## 2022-02-23 DIAGNOSIS — N400 Enlarged prostate without lower urinary tract symptoms: Secondary | ICD-10-CM

## 2022-02-23 LAB — CBC
HCT: 25.7 % — ABNORMAL LOW (ref 39.0–52.0)
Hemoglobin: 8.2 g/dL — ABNORMAL LOW (ref 13.0–17.0)
MCH: 24.7 pg — ABNORMAL LOW (ref 26.0–34.0)
MCHC: 31.9 g/dL (ref 30.0–36.0)
MCV: 77.4 fL — ABNORMAL LOW (ref 80.0–100.0)
Platelets: 584 10*3/uL — ABNORMAL HIGH (ref 150–400)
RBC: 3.32 MIL/uL — ABNORMAL LOW (ref 4.22–5.81)
RDW: 19.7 % — ABNORMAL HIGH (ref 11.5–15.5)
WBC: 23.2 10*3/uL — ABNORMAL HIGH (ref 4.0–10.5)
nRBC: 0 % (ref 0.0–0.2)

## 2022-02-23 LAB — COMPREHENSIVE METABOLIC PANEL
ALT: 10 U/L (ref 0–44)
AST: 15 U/L (ref 15–41)
Albumin: 2.2 g/dL — ABNORMAL LOW (ref 3.5–5.0)
Alkaline Phosphatase: 52 U/L (ref 38–126)
Anion gap: 7 (ref 5–15)
BUN: 7 mg/dL — ABNORMAL LOW (ref 8–23)
CO2: 23 mmol/L (ref 22–32)
Calcium: 7.8 mg/dL — ABNORMAL LOW (ref 8.9–10.3)
Chloride: 101 mmol/L (ref 98–111)
Creatinine, Ser: 0.63 mg/dL (ref 0.61–1.24)
GFR, Estimated: >60 mL/min (ref >60)
Glucose, Bld: 160 mg/dL — ABNORMAL HIGH (ref 70–99)
Potassium: 3.5 mmol/L (ref 3.5–5.1)
Sodium: 131 mmol/L — ABNORMAL LOW (ref 135–145)
Total Bilirubin: 0.4 mg/dL (ref 0.3–1.2)
Total Protein: 5.5 g/dL — ABNORMAL LOW (ref 6.5–8.1)

## 2022-02-23 MED ORDER — FLEET ENEMA 7-19 GM/118ML RE ENEM: 2.0000 | ENEMA | RECTAL | Status: DC

## 2022-02-23 NOTE — Assessment & Plan Note (Addendum)
Could be secondary to alcohol abuse.  Sodium up to 130 today. ?

## 2022-02-23 NOTE — Consult Note (Signed)
? ? ?GI Inpatient Consult Note ? ?Reason for Consult: Large rectal mass ?  ?Attending Requesting Consult: Dr. Caroleen Hamman ? ?History of Present Illness: ?Kyle Larson is a 66 y.o. male seen for evaluation of large rectal mass at the request of General Surgery - Dr. Caroleen Hamman. Patient has a PMH of CAD s/p PCI 04/2021, Hx of CVA 12/2021 on aspirin and Plavix, tobacco abuse, and DDD. He presented to the [facility] ED yesterday afternoon for chief complaint of severe acute, diffuse abdominal pain, nausea, and diarrhea. Work-up in the ED revealed a large necrotic rectal mass measuring 8.8 x 7.2 x 8.9 cm with partial obstruction and rentention of stool in the colon with concerns for invasion into the urinary bladder. He was seen emergently by Dr. Caroleen Hamman of General Surgery and taken to the operating room emergently. He is s/p laparoscopic diverting loop colostomy for large bowel obstruction with Dr. Dahlia Byes last night. GI is consulted this morning by Dr. Dahlia Byes for consideration of flexible sigmoidoscopy for tissue diagnosis of his large rectal mass.  ? ?Patient seen and examined this morning resting comfortably in hospital bed. No acute events overnight. No immediate post-procedure complications last night. His wife, [name], is bedside. He reports over the past week he has been dealing with diarrhea which at times has been uncontrollable. He reports a 20-lb weight loss over the past year. His appetite has been poor. He reports over the past 6 months he has noticed intermittent painless hematochezia. About every other time he had a bowel movement he would notice some bright red to pink blood on tissue paper after wiping or intermixed in the stool. He has never had a colonoscopy. He denies any known family history of any GI malignancies. He did have a stroke in February and was started on ASA and Plavix but report she has not been taking this as prescribed. He did not follow up with Neurology or PCP about need for long-term  continuation of antiplatelet therapy.  ? ? ?Past Medical History:  ?Past Medical History:  ?Diagnosis Date  ? AC joint dislocation   ? CAD (coronary artery disease)   ? CVA (cerebral vascular accident) [facility])   ? Metatarsal bone fracture   ? 3rd metatarsal , left foot; April 2021  ? Syncope   ? June '22  ? Tobacco abuse   ?  ?Problem List: ?Patient Active Problem List  ? Diagnosis Date Noted  ? Colonic mass 02/22/2022  ? Hydronephrosis   ? Coronary artery disease due to lipid rich plaque   ? Completed stroke ([place]) 01/06/2022  ? Anxiety 01/06/2022  ? Microcytic anemia 01/06/2022  ? Diarrhea 01/06/2022  ? Chest pain 05/20/2021  ? Chronic hyponatremia 05/20/2021  ? Leukocytosis 05/20/2021  ? Alcohol abuse 05/20/2021  ? Tobacco abuse   ? Syncope 05/19/2021  ? Separation of left acromioclavicular joint 05/21/2016  ?  ?Past Surgical History: ?Past Surgical History:  ?Procedure Laterality Date  ? ACROMIO-CLAVICULAR JOINT REPAIR Left 09/11/2016  ? Procedure: ACROMIO-CLAVICULAR RECONSTRUCTION;  Surgeon: Corky Mull, MD;  Location: [facility] ORS;  Service: Orthopedics;  Laterality: Left;  clavicle  ? LEFT HEART CATH AND CORONARY ANGIOGRAPHY N/A 05/20/2021  ? Procedure: LEFT HEART CATH AND CORONARY ANGIOGRAPHY with coronary intervention;  Surgeon: [name], MD;  Location: [place] [facility];  Service: Cardiovascular;  Laterality: N/A;  ? MANDIBLE RECONSTRUCTION    ? TONSILLECTOMY    ? AGE 65  ? XI ROBOTIC ASSISTED INGUINAL HERNIA REPAIR WITH MESH  Right 03/26/2020  ? Procedure: XI ROBOTIC ASSISTED INGUINAL HERNIA REPAIR WITH MESH;  Surgeon: Herbert Pun, MD;  Location: [facility] ORS;  Service: General;  Laterality: Right;  ?  ?Allergies: ?No Known Allergies  ?Home Medications: ?Medications Prior to Admission  ?Medication Sig Dispense Refill Last Dose  ? aspirin EC 81 MG EC tablet Take 1 tablet (81 mg total) by mouth daily. Swallow whole. 30 tablet 0 02/21/2022 at AM  ? nicotine (NICODERM CQ - DOSED IN MG/24 HOURS) 21 mg/24hr  patch Place 21 mg onto the skin daily.   02/22/2022  ? atorvastatin (LIPITOR) 40 MG tablet Take 1 tablet (40 mg total) by mouth daily. 30 tablet 0   ? clopidogrel (PLAVIX) 75 MG tablet Take 1 tablet (75 mg total) by mouth daily. 30 tablet 0   ? ferrous sulfate 325 (65 FE) MG tablet Take 1 tablet (325 mg total) by mouth daily. 30 tablet 0   ? Multiple Vitamins-Minerals (MULTIVITAMIN WITH MINERALS) tablet Take 1 tablet by mouth daily.   02/17/2022  ? tamsulosin (FLOMAX) 0.4 MG CAPS capsule Take 1 capsule (0.4 mg total) by mouth daily after supper. 30 capsule 0   ? ?Home medication reconciliation was completed with the patient.  ? ?Scheduled Inpatient Medications: ?  ? acetaminophen  1,000 mg Oral W0J  ? folic acid  1 mg Intravenous Daily  ? ketorolac  15 mg Intravenous Q6H  ? nicotine  21 mg Transdermal Daily  ? tamsulosin  0.4 mg Oral QPC supper  ? thiamine injection  100 mg Intravenous STAT  ? thiamine  100 mg Oral Daily  ? Or  ? thiamine  100 mg Intravenous Daily  ? ? ?Continuous Inpatient Infusions: ?  ? lactated ringers 75 mL/hr at 02/23/22 0700  ? piperacillin-tazobactam (ZOSYN)  IV Stopped (02/23/22 0356)  ? ? ?PRN Inpatient Medications:  ?HYDROmorphone (DILAUDID) injection, ondansetron **OR** ondansetron (ZOFRAN) IV, oxyCODONE ? ?Family History: ?family history includes Diabetes in his mother; Prostate cancer in his father.  The patient's family history is negative for inflammatory bowel disorders, GI malignancy, or solid organ transplantation. ? ?Social History:  ? reports that he has been smoking cigarettes. He has a 3.75 pack-year smoking history. He has been exposed to tobacco smoke. He has never used smokeless tobacco. He reports current alcohol use of about 42.0 standard drinks per week. He reports that he does not use drugs. The patient denies ETOH, tobacco, or drug use.  ? ?Review of Systems: ?Constitutional: Weight is stable.  ?Eyes: No changes in vision. ?ENT: No oral lesions, sore throat.  ?GI: see  HPI.  ?Heme/Lymph: No easy bruising.  ?CV: No chest pain.  ?GU: No hematuria.  ?Integumentary: No rashes.  ?Neuro: No headaches.  ?Psych: No depression/anxiety.  ?Endocrine: No heat/cold intolerance.  ?Allergic/Immunologic: No urticaria.  ?Resp: No cough, SOB.  ?Musculoskeletal: No joint swelling.  ?  ?Physical Examination: ?BP 112/69 (BP Location: Right Arm)   Pulse 72   Temp (!) 97.5 ?F (36.4 ?C) (Oral)   Resp 20   Ht 6' (1.829 m)   Wt 65.9 kg   SpO2 94%   BMI 19.70 kg/m?  ?Gen: NAD, alert and oriented x 4, wife at bedside, cachetic-appearing  ?HEENT: PEERLA, EOMI, ?Neck: supple, no JVD or thyromegaly ?Chest: CTA bilaterally, no wheezes, crackles, or other adventitious sounds ?CV: RRR, no m/g/c/r ?Abd: soft, nondistended, bowel sounds present, ostomy in place in LLQ with no obvious blood, no HSM, guarding, ridigity, or rebound tenderness ?Ext: no edema, well  perfused with 2+ pulses, ?Skin: no rash or lesions noted ?Lymph: no LAD ? ?Data: ?Lab Results  ?Component Value Date  ? WBC 23.2 (H) 02/23/2022  ? HGB 8.2 (L) 02/23/2022  ? HCT 25.7 (L) 02/23/2022  ? MCV 77.4 (L) 02/23/2022  ? PLT 584 (H) 02/23/2022  ? ?Recent Labs  ?Lab 02/22/22 ?1458 02/23/22 ?0559  ?HGB 9.9* 8.2*  ? ?Lab Results  ?Component Value Date  ? NA 131 (L) 02/23/2022  ? K 3.5 02/23/2022  ? CL 101 02/23/2022  ? CO2 23 02/23/2022  ? BUN 7 (L) 02/23/2022  ? CREATININE 0.63 02/23/2022  ? ?Lab Results  ?Component Value Date  ? ALT 10 02/23/2022  ? AST 15 02/23/2022  ? ALKPHOS 52 02/23/2022  ? BILITOT 0.4 02/23/2022  ? ?No results for input(s): APTT, INR, PTT in the last 168 hours. ? ?CT abd/pelvis with contrast 02/22/22: ?IMPRESSION: ?1. Large necrotic rectal mass measuring at least 8.8 x 7.2 x 8.9 cm ?with partial obstruction and retention of stool in the colon. 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 ?  ?2. Urinary bladder wall thickening with hydronephrosis, likely ?secondary to direct tumor invasion from the large rectal  mass. ?Urinary tract infection with cystitis is in the differential. ?  ?3. Multiple Schmorl nodes with advanced degenerate disc disease. ?These may also be metastatic process, bone scan could be helpful for ?further

## 2022-02-23 NOTE — Progress Notes (Signed)
?Progress Note ? ? ?Patient: Kyle Larson XNT:[id number] DOB: [birth year] DOA: 02/22/2022     1 ?DOS: the patient was seen and examined on 02/23/2022 ?  ? ? ?Assessment and Plan: ?* Colonic mass ?Patient had a urgent diverting colostomy on 02/23/2022 by Dr. Dahlia Byes.  Dr. Alice Reichert gastroenterology to do a flexible sigmoidoscopy to get tissue diagnosis on 02/24/2022.  CEA added onto labs yesterday.  Consulted Dr. Grayland Ormond oncology.  Patient on empiric Zosyn for right now ? ?Hydronephrosis ?Watch kidney function closely.  If creatinine rises can consider percutaneous nephrostomy tubes but if creatinine stays normal continue to monitor conservatively. ? ?Alcohol abuse ?No signs of withdrawal currently. ? ?Completed stroke ([place]) ?Holding aspirin Plavix and Lipitor for right now.  Having tissue biopsy tomorrow. ? ?Coronary artery disease due to lipid rich plaque ?Holding aspirin Plavix and Lipitor for right now. ? ?Tobacco abuse ?Continue nicotine patch ? ?BPH (benign prostatic hyperplasia) ?Continue Flomax ? ?Thrombocytosis ?Could be secondary to colonic mass and acute phase reactant or with iron deficiency anemia.  Either way platelet count came down from 727 down to 584. ? ?Iron deficiency anemia ?Hemoglobin down to 8.2 with fluids.  Ferritin low at 8 in February.  May end up needing a blood transfusion during the hospital course. ? ?Hyponatremia ?Could be secondary to alcohol abuse.  Sodium up to 131 today. ? ? ? ? ?  ? ?Subjective: Patient feeling much better today than he did in the past week.  Had severe abdominal pain yesterday.  Only having soreness in his abdomen today.  Had urgent diverting colostomy yesterday for large colon mass.  Patient unsure if he wants chemotherapy if this turns out to be cancerous. ? ?Physical Exam: ?Vitals:  ? 02/23/22 0505 02/23/22 0848 02/23/22 1200 02/23/22 1236  ?BP: 112/69 107/69 104/68 104/68  ?Pulse: 72 62 80 73  ?Resp: '20 18  20  '$ ?Temp: (!) 97.5 ?F (36.4 ?C) 98.3 ?F (36.8 ?C)  (!) 97.5 ?F  (36.4 ?C)  ?TempSrc: Oral Oral  Oral  ?SpO2: 94% 95%  95%  ?Weight:      ?Height:      ? ?Physical Exam ?HENT:  ?   Head: Normocephalic.  ?   Mouth/Throat:  ?   Pharynx: No oropharyngeal exudate.  ?Eyes:  ?   General: Lids are normal.  ?   Conjunctiva/sclera: Conjunctivae normal.  ?Cardiovascular:  ?   Rate and Rhythm: Normal rate and regular rhythm.  ?   Heart sounds: Normal heart sounds, S1 normal and S2 normal.  ?Pulmonary:  ?   Breath sounds: No decreased breath sounds, wheezing, rhonchi or rales.  ?Abdominal:  ?   Palpations: Abdomen is soft.  ?   Tenderness: There is generalized abdominal tenderness.  ?Musculoskeletal:  ?   Right lower leg: No swelling.  ?   Left lower leg: No swelling.  ?Skin: ?   General: Skin is warm.  ?   Findings: No rash.  ?Neurological:  ?   Mental Status: He is alert and oriented to person, place, and time.  ?  ?Data Reviewed: ?White blood cell count elevated 23.2, hemoglobin 8.2, platelet count 584, sodium 131, creatinine 0.63 ? ? ?Family Communication: Spoke with wife at bedside ? ?Disposition: ?Status is: Inpatient ?Remains inpatient appropriate because: Recovering from urgent diverting colostomy for rectal mass.  Still need tissue diagnosis and treatment plan.  Will need to stay in the hospital until tolerating solid food and having output out of the colostomy. ? ?Planned Discharge Destination:  Home ? ? ?Author: ?Loletha Grayer, MD ?02/23/2022 1:16 PM ? ?For on call review [email or web address].  ?

## 2022-02-23 NOTE — Assessment & Plan Note (Addendum)
Patient had a urgent diverting colostomy on 02/23/2022 by Dr. Dahlia Byes.  Dr. Alice Reichert gastroenterology to do a flexible sigmoidoscopy to get tissue diagnosis on 02/24/2022.  CEA only 6.7.  CT scan of the chest done here.  MRI of the abdomen can be done with Dr. Grayland Ormond as outpatient.  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 ?

## 2022-02-23 NOTE — Assessment & Plan Note (Signed)
-   Continue Flomax 

## 2022-02-23 NOTE — Assessment & Plan Note (Signed)
Continue nicotine patch  °

## 2022-02-23 NOTE — Progress Notes (Signed)
POD # 1 ?S/p rob colostomy ?A lot of gas in bag ?Rested well ?AVSS ?Drastic improvement ? ?PE NAD ?Abd: soft ostomy pink patent w gas ? ?A/P Doing well ?May start diet after flex sig ?D/W Dr. Alice Reichert about flex sig for tissue dx ? ?

## 2022-02-23 NOTE — Assessment & Plan Note (Addendum)
Could be secondary to colonic mass and acute phase reactant or with iron deficiency anemia.  Either way platelet count came down from 727 and is 607 today ?

## 2022-02-23 NOTE — Assessment & Plan Note (Addendum)
Spoke with patient and wife and they both wanted to hold the aspirin and Plavix since he still had some rectal bleeding.  And patient anemic.  Higher risk of stroke off medications. ?

## 2022-02-23 NOTE — Assessment & Plan Note (Addendum)
Creatinine good.  Noo need for nephrostomy tubes currently ?

## 2022-02-23 NOTE — Assessment & Plan Note (Signed)
No signs of withdrawal currently. ?

## 2022-02-23 NOTE — Anesthesia Postprocedure Evaluation (Signed)
Anesthesia Post Note ? ?Patient: Kyle Larson ? ?Procedure(s) Performed: XI ROBOTIC [place] ? ?Patient location during evaluation: PACU ?Anesthesia Type: General ?Level of consciousness: awake and alert ?Pain management: pain level controlled ?Vital Signs Assessment: post-procedure vital signs reviewed and stable ?Respiratory status: spontaneous breathing, nonlabored ventilation, respiratory function stable and patient connected to nasal cannula oxygen ?Cardiovascular status: blood pressure returned to baseline and stable ?Postop Assessment: no apparent nausea or vomiting ?Anesthetic complications: no ? ? ?No notable events documented. ? ? ?Last Vitals:  ?Vitals:  ? 02/23/22 0505 02/23/22 0848  ?BP: 112/69 107/69  ?Pulse: 72 62  ?Resp: 20 18  ?Temp: (!) 36.4 ?C 36.8 ?C  ?SpO2: 94% 95%  ?  ?Last Pain:  ?Vitals:  ? 02/23/22 0848  ?TempSrc: Oral  ?PainSc:   ? ? ?  ?  ?  ?  ?  ?  ? ?[place] Ouch ? ? ? ? ?

## 2022-02-23 NOTE — Assessment & Plan Note (Addendum)
Holding aspirin Plavix with anemia and rectal bleeding.  ?

## 2022-02-23 NOTE — Assessment & Plan Note (Addendum)
Hemoglobin down to 8.1 with fluids.  Ferritin low at 8 in February.  Patient received IV iron during the hospital course.  Follow-up with Dr. Grayland Ormond as outpatient. ?

## 2022-02-24 ENCOUNTER — Encounter
Admission: EM | Disposition: A | Discharge status: Home Health | Payer: Self-pay | Source: Home / Self Care | Attending: Internal Medicine

## 2022-02-24 ENCOUNTER — Inpatient Hospital Stay: Payer: Medicare Other | Admitting: Anesthesiology

## 2022-02-24 ENCOUNTER — Encounter: Payer: Self-pay | Admitting: Gastroenterology

## 2022-02-24 DIAGNOSIS — F1010 Alcohol abuse, uncomplicated: Secondary | ICD-10-CM | POA: Diagnosis not present

## 2022-02-24 DIAGNOSIS — N1330 Unspecified hydronephrosis: Secondary | ICD-10-CM | POA: Diagnosis not present

## 2022-02-24 DIAGNOSIS — K6389 Other specified diseases of intestine: Secondary | ICD-10-CM | POA: Diagnosis not present

## 2022-02-24 DIAGNOSIS — I639 Cerebral infarction, unspecified: Secondary | ICD-10-CM | POA: Diagnosis not present

## 2022-02-24 DIAGNOSIS — E43 Unspecified severe protein-calorie malnutrition: Secondary | ICD-10-CM | POA: Insufficient documentation

## 2022-02-24 HISTORY — PX: FLEXIBLE SIGMOIDOSCOPY: SHX5431

## 2022-02-24 LAB — CEA: CEA: 6.7 ng/mL — ABNORMAL HIGH (ref 0.0–4.7)

## 2022-02-24 LAB — URINE CULTURE

## 2022-02-24 LAB — BASIC METABOLIC PANEL
Anion gap: 6 (ref 5–15)
BUN: 11 mg/dL (ref 8–23)
CO2: 24 mmol/L (ref 22–32)
Calcium: 7.8 mg/dL — ABNORMAL LOW (ref 8.9–10.3)
Chloride: 100 mmol/L (ref 98–111)
Creatinine, Ser: 0.57 mg/dL — ABNORMAL LOW (ref 0.61–1.24)
GFR, Estimated: >60 mL/min (ref >60)
Glucose, Bld: 101 mg/dL — ABNORMAL HIGH (ref 70–99)
Potassium: 3.3 mmol/L — ABNORMAL LOW (ref 3.5–5.1)
Sodium: 130 mmol/L — ABNORMAL LOW (ref 135–145)

## 2022-02-24 LAB — CBC
HCT: 25.3 % — ABNORMAL LOW (ref 39.0–52.0)
Hemoglobin: 7.9 g/dL — ABNORMAL LOW (ref 13.0–17.0)
MCH: 24.1 pg — ABNORMAL LOW (ref 26.0–34.0)
MCHC: 31.2 g/dL (ref 30.0–36.0)
MCV: 77.1 fL — ABNORMAL LOW (ref 80.0–100.0)
Platelets: 621 10*3/uL — ABNORMAL HIGH (ref 150–400)
RBC: 3.28 MIL/uL — ABNORMAL LOW (ref 4.22–5.81)
RDW: 19.8 % — ABNORMAL HIGH (ref 11.5–15.5)
WBC: 22.2 10*3/uL — ABNORMAL HIGH (ref 4.0–10.5)
nRBC: 0 % (ref 0.0–0.2)

## 2022-02-24 SURGERY — SIGMOIDOSCOPY, FLEXIBLE
Anesthesia: General

## 2022-02-24 MED ORDER — PROPOFOL 10 MG/ML IV BOLUS
INTRAVENOUS | Status: DC | PRN
  Administered 2022-02-24: 50 mg via INTRAVENOUS

## 2022-02-24 MED ORDER — CHLORHEXIDINE GLUCONATE CLOTH 2 % EX PADS
6.0000 | MEDICATED_PAD | Freq: Every day | CUTANEOUS | Status: DC
  Administered 2022-02-25: 6 via TOPICAL

## 2022-02-24 MED ORDER — FENTANYL CITRATE (PF) 100 MCG/2ML IJ SOLN
INTRAMUSCULAR | Status: AC
  Filled 2022-02-24: qty 2

## 2022-02-24 MED ORDER — SODIUM CHLORIDE 0.9 % IV SOLN
INTRAVENOUS | Status: DC
  Administered 2022-02-24: 20 mL/h via INTRAVENOUS

## 2022-02-24 MED ORDER — ADULT MULTIVITAMIN W/MINERALS CH
1.0000 | ORAL_TABLET | Freq: Every day | ORAL | Status: DC
  Administered 2022-02-25: 1 via ORAL
  Filled 2022-02-24: qty 1

## 2022-02-24 MED ORDER — POTASSIUM CHLORIDE CRYS ER 20 MEQ PO TBCR
40.0000 meq | EXTENDED_RELEASE_TABLET | Freq: Once | ORAL | Status: AC
Start: 2022-02-24 — End: 2022-02-24
  Administered 2022-02-24: 40 meq via ORAL
  Filled 2022-02-24: qty 2

## 2022-02-24 MED ORDER — LIDOCAINE HCL (CARDIAC) PF 100 MG/5ML IV SOSY
PREFILLED_SYRINGE | INTRAVENOUS | Status: DC | PRN
  Administered 2022-02-24: 60 mg via INTRAVENOUS

## 2022-02-24 MED ORDER — ENSURE ENLIVE PO LIQD
237.0000 mL | Freq: Three times a day (TID) | ORAL | Status: DC
  Administered 2022-02-24 – 2022-02-25 (×2): 237 mL via ORAL

## 2022-02-24 MED ORDER — PROPOFOL 500 MG/50ML IV EMUL
INTRAVENOUS | Status: DC | PRN
  Administered 2022-02-24: 50 ug/kg/min via INTRAVENOUS

## 2022-02-24 MED ORDER — FENTANYL CITRATE (PF) 100 MCG/2ML IJ SOLN
INTRAMUSCULAR | Status: DC | PRN
  Administered 2022-02-24 (×2): 50 ug via INTRAVENOUS

## 2022-02-24 MED ORDER — SODIUM CHLORIDE 0.9 % IV SOLN
400.0000 mg | Freq: Once | INTRAVENOUS | Status: AC
  Administered 2022-02-24: 400 mg via INTRAVENOUS
  Filled 2022-02-24: qty 20

## 2022-02-24 MED ORDER — FOLIC ACID 1 MG PO TABS
1.0000 mg | ORAL_TABLET | Freq: Every day | ORAL | Status: DC
  Administered 2022-02-25: 1 mg via ORAL
  Filled 2022-02-24: qty 1

## 2022-02-24 NOTE — Progress Notes (Signed)
Initial Nutrition Assessment ? ?DOCUMENTATION CODES:  ? ?Severe malnutrition in context of chronic illness ? ?INTERVENTION:  ? ?Ensure Enlive po TID, each supplement provides 350 kcal and 20 grams of protein. ? ?MVI po daily  ? ?Pt at high refeed risk; recommend monitor potassium, magnesium and phosphorus labs daily until stable ? ?Daily weights  ? ?NUTRITION DIAGNOSIS:  ? ?Severe Malnutrition related to chronic illness (etoh abuse, colon cancer) as evidenced by severe fat depletion, severe muscle depletion. ? ?GOAL:  ? ?Patient will meet greater than or equal to 90% of their needs ? ?MONITOR:  ? ?PO intake, Supplement acceptance, Labs, Weight trends, Skin, I & O's ? ?REASON FOR ASSESSMENT:  ? ?Malnutrition Screening Tool ?  ? ?ASSESSMENT:  ? ?66 y/o male with h/o etoh abuse, BPH, anxiety, CAD and CVA who is admitted with new colon mass s/p diverting colostomy on 02/23/2022. ? ?Pt s/p flexible sigmoidoscopy today with biopsies.  ? ?Met with pt in room today. Pt reports poor appetite and oral intake for the past one and a half weeks r/t abdominal pain. RD suspects pt with decreased oral intake at baseline r/t etoh abuse. Pt initiated on a soft diet today. Pt reports that he was hungry today and ate pretty well; pt's lunch tray was sitting on his side table and was 90% eaten. Pt ate meatloaf and mashed potatoes. Pt reports having some abdominal cramping after eating; he states the pain feels as if he needs to have a bowel movement. Pt reports minimal pain around his incision. Pt reports that he is passing flatus but no BM yet via ostomy. RD discussed with pt the importance of adequate nutrition needed to preserve lean muscle and support post-op healing. Pt reports that he is willing to drink chocolate Ensure. RD will add supplements and MVI. Pt is at high refeed risk. Pt reports his UBW is 155-160lbs; pt reports that he hasn't weighed this for several years. Per chart, pt appears weight stable since December. Per chart,  pt is up ~20lbs since admission.  ? ?Medications reviewed and include: folic acid, nicotine, KCl, thiamine, iron sucrose, zosyn  ? ?Labs reviewed: Na 130(L), K 3.3(L), creat 0.57(L) ?Wbc- 22.2(H), Hgb 7.9(L), Hct 25.3(L), MCV 77.1(L), MCH 24.1(L) ? ?NUTRITION - FOCUSED PHYSICAL EXAM: ? ?Flowsheet Row Most Recent Value  ?Orbital Region Moderate depletion  ?Upper Arm Region Severe depletion  ?Thoracic and Lumbar Region Severe depletion  ?Buccal Region Moderate depletion  ?Temple Region Moderate depletion  ?Clavicle Bone Region Severe depletion  ?Clavicle and Acromion Bone Region Severe depletion  ?Scapular Bone Region Severe depletion  ?Dorsal Hand Severe depletion  ?Patellar Region Severe depletion  ?Anterior Thigh Region Severe depletion  ?Posterior Calf Region Severe depletion  ?Edema (RD Assessment) None  ?Hair Reviewed  ?Eyes Reviewed  ?Mouth Reviewed  ?Skin Reviewed  ?Nails Reviewed  ? ?Diet Order:   ?Diet Order   ? ?       ?  DIET SOFT Room service appropriate? Yes; Fluid consistency: Thin  Diet effective now       ?  ? ?  ?  ? ?  ? ?EDUCATION NEEDS:  ? ?Education needs have been addressed ? ?Skin:  Skin Assessment: Reviewed RN Assessment (incision abdomen) ? ?Last BM:  4/3- type 7 ? ?Height:  ? ?Ht Readings from Last 1 Encounters:  ?02/22/22 6' (1.829 m)  ? ? ?Weight:  ? ?Wt Readings from Last 1 Encounters:  ?02/22/22 65.9 kg  ? ? ?Ideal Body Weight:  80.9 kg ? ?BMI:  Body mass index is 19.7 kg/m?. ? ?Estimated Nutritional Needs:  ? ?Kcal:  2000-2300kcal/day ? ?Protein:  100-115g/day ? ?Fluid:  2.0-2.3L/day ? ?Koleen Distance MS, RD, LDN ?Please refer to [facility] for RD and/or RD on-call/weekend/after hours pager ? ?

## 2022-02-24 NOTE — Op Note (Signed)
[facility] ?Gastroenterology ?Patient Name: Kyle Larson ?Procedure Date: 02/24/2022 12:07 PM ?MRN: [id number] ?Account #: [id number] ?Date of Birth: May 28, 1956 ?Admit Type: Outpatient ?Age: 66 ?Room: [facility] ENDO ROOM 3 ?Gender: Male ?Note Status: Finalized ?Instrument Name: Peds Colonoscope [id number] ?Procedure:             Flexible Sigmoidoscopy ?Indications:           Abnormal CT of the GI tract ?Providers:             Andrey Farmer MD, MD ?Medicines:             Monitored Anesthesia Care ?Complications:         No immediate complications. Estimated blood loss:  ?                       Minimal. ?Procedure:             Pre-Anesthesia Assessment: ?                       - Prior to the procedure, a History and Physical was  ?                       performed, and patient medications and allergies were  ?                       reviewed. The patient is competent. The risks and  ?                       benefits of the procedure and the sedation options and  ?                       risks were discussed with the patient. All questions  ?                       were answered and informed consent was obtained.  ?                       Patient identification and proposed procedure were  ?                       verified by the physician, the nurse, the  ?                       anesthesiologist, the anesthetist and the technician  ?                       in the endoscopy suite. Mental Status Examination:  ?                       alert and oriented. Airway Examination: normal  ?                       oropharyngeal airway and neck mobility. Respiratory  ?                       Examination: clear to auscultation. CV Examination:  ?                       normal. Prophylactic Antibiotics: The patient does not  ?  require prophylactic antibiotics. Prior  ?                       Anticoagulants: The patient has taken Plavix  ?                       (clopidogrel), last dose was 3 days prior to  ?                        procedure. ASA Grade Assessment: III - A patient with  ?                       severe systemic disease. After reviewing the risks and  ?                       benefits, the patient was deemed in satisfactory  ?                       condition to undergo the procedure. The anesthesia  ?                       plan was to use monitored anesthesia care (MAC).  ?                       Immediately prior to administration of medications,  ?                       the patient was re-assessed for adequacy to receive  ?                       sedatives. The heart rate, respiratory rate, oxygen  ?                       saturations, blood pressure, adequacy of pulmonary  ?                       ventilation, and response to care were monitored  ?                       throughout the procedure. The physical status of the  ?                       patient was re-assessed after the procedure. ?                       After obtaining informed consent, the scope was passed  ?                       under direct vision. The Colonoscope was introduced  ?                       through the anus and advanced to the the rectosigmoid  ?                       junction. The flexible sigmoidoscopy was accomplished  ?                       without difficulty. The patient tolerated the  ?  procedure well. The quality of the bowel preparation  ?                       was fair. ?Findings: ?     The perianal and digital rectal examinations were normal. ?     A fungating, infiltrative and ulcerated partially obstructing large mass  ?     was found in the rectum and in the recto-sigmoid colon. The mass was  ?     partially circumferential (involving two-thirds of the lumen  ?     circumference). The mass measured nineteen cm in length. No bleeding was  ?     present. This was biopsied with a cold forceps for histology. Estimated  ?     blood loss was minimal. ?Impression:            - Preparation of the colon was  fair. ?                       - Malignant partially obstructing tumor in the rectum  ?                       and in the recto-sigmoid colon. Biopsied. ?Recommendation:        - Resume previous diet. ?                       - Return patient to hospital ward for ongoing care. ?                       - Await pathology results. ?Procedure Code(s):     --- Professional --- ?                       [phone], 52, Sigmoidoscopy, flexible; with biopsy,  ?                       single or multiple ?Diagnosis Code(s):     --- Professional --- ?                       C20, Malignant neoplasm of rectum ?                       C19, Malignant neoplasm of rectosigmoid junction ?                       K56.690, Other partial intestinal obstruction ?                       R93.3, Abnormal findings on diagnostic imaging of  ?                       other parts of digestive tract ?CPT copyright 2019 [employer]. All rights reserved. ?The codes documented in this report are preliminary and upon coder review may  ?be revised to meet current compliance requirements. ?Andrey Farmer MD, MD ?02/24/2022 1:20:47 PM ?Number of Addenda: 0 ?Note Initiated On: 02/24/2022 12:07 PM ?Total Procedure Duration: 0 hours 4 minutes 59 seconds  ?Estimated Blood Loss:  Estimated blood loss was minimal. ?     [facility] ?

## 2022-02-24 NOTE — Progress Notes (Signed)
Patient had several small loose stools from rectum overnight. Patient was concerned stating that he was told due to colostomy placement he would never have a rectal stool again. Nurse educated patient that this can sometimes occur post colectomy placement. Patient consent for flexible sigmoidoscopy not signed as patient stated that he would like for the provider to briefly explain procedure to him again. Form partially completed and placed in patient chart for signatures. ?

## 2022-02-24 NOTE — Anesthesia Postprocedure Evaluation (Signed)
Anesthesia Post Note ? ?Patient: Kyle Larson ? ?Procedure(s) Performed: FLEXIBLE SIGMOIDOSCOPY ? ?Patient location during evaluation: Endoscopy ?Anesthesia Type: General ?Level of consciousness: awake and alert ?Pain management: pain level controlled ?Vital Signs Assessment: post-procedure vital signs reviewed and stable ?Respiratory status: spontaneous breathing, nonlabored ventilation, respiratory function stable and patient connected to nasal cannula oxygen ?Cardiovascular status: blood pressure returned to baseline and stable ?Postop Assessment: no apparent nausea or vomiting ?Anesthetic complications: no ? ? ?No notable events documented. ? ? ?Last Vitals:  ?Vitals:  ? 02/24/22 1323 02/24/22 1333  ?BP: (!) 132/92 (!) 143/88  ?Pulse: 95 94  ?Resp: 18 20  ?Temp:    ?SpO2:    ?  ?Last Pain:  ?Vitals:  ? 02/24/22 1333  ?TempSrc:   ?PainSc: 0-No pain  ? ? ?  ?  ?  ?  ?  ?  ? ?Arita Miss ? ? ? ? ?

## 2022-02-24 NOTE — Consult Note (Signed)
[place] Nurse ostomy consult note ?Stoma type/location: LLQ, loop colostomy  ?Stomal assessment/size: 1 3/4" round, budded, pink, moist ?Peristomal assessment: NA ?Treatment options for stomal/peristomal skin: NA ?Output none, +flatus ?Ostomy pouching: 2pc. 2 3/4" ?Education provided:  ?Meeting with patient for ostomy teaching, bedside nurse arrived to administer enema for upcoming procedure.  Patient uncomfortable with pain. Decision to forgo teaching today in order for prep for procedure and due to pain.  Left educational materials in the room, wife to visit later. (4) 2 3/4" pouch/barriers and (4) barrier rings left in the patient's room ?Enrolled patient in [facility] program: Yes ? ?[place] Nurse will follow along with you for continued support with ostomy teaching and care ?Kyle Fielden Liane Comber MSN, RN, [place], [place], [place] ?[phone]  ?

## 2022-02-24 NOTE — Progress Notes (Signed)
?  Progress Note ? ? ?Patient: Kyle Larson LID:[id number] DOB: 17-Dec-1955 DOA: 02/22/2022     2 ?DOS: the patient was seen and examined on 02/24/2022 ?  ? ? ?Assessment and Plan: ?* Colonic mass ?Patient had a urgent diverting colostomy on 02/23/2022 by Dr. Dahlia Byes.  Dr. Alice Reichert gastroenterology to do a flexible sigmoidoscopy to get tissue diagnosis on 02/24/2022.  CEA still pending. Consulted Dr. Grayland Ormond oncology.  Patient on empiric Zosyn for right now. ? ?Hydronephrosis ?Creatinine good.  Noo need for nephrostomy tubes currently ? ?Alcohol abuse ?No signs of withdrawal currently. ? ?Completed stroke ([place]) ?Holding aspirin Plavix for right now.  Having tissue biopsy today. ? ?Coronary artery disease due to lipid rich plaque ?Holding aspirin Plavix for right now. ? ?Tobacco abuse ?Continue nicotine patch ? ?BPH (benign prostatic hyperplasia) ?Continue Flomax ? ?Thrombocytosis ?Could be secondary to colonic mass and acute phase reactant or with iron deficiency anemia.  Either way platelet count came down from 727 and is 621 today ? ?Iron deficiency anemia ?Hemoglobin down to 7.9 with fluids.  Ferritin low at 8 in February.  May end up needing a blood transfusion during the hospital course.  Will give iv iron later. ? ?Hyponatremia ?Could be secondary to alcohol abuse.  Sodium up to 130 today. ? ? ? ? ?  ? ?Subjective: Patient has soreness in the abdomen today.  Less pain then when he came in.  Having diarrhea from rectum.  For flex sig today.  Came in with abdominal pain and found to have rectal mass. ? ?Physical Exam: ?Vitals:  ? 02/23/22 2130 02/24/22 0513 02/24/22 0826 02/24/22 1221  ?BP: 110/74 123/71 129/89 (!) 142/88  ?Pulse: 68 84 91 89  ?Resp:  '18 20 16  '$ ?Temp:  98 ?F (36.7 ?C) 97.6 ?F (36.4 ?C) (!) 96.9 ?F (36.1 ?C)  ?TempSrc:  Oral  Temporal  ?SpO2:  96% 96% 98%  ?Weight:      ?Height:      ? ?Physical Exam ?HENT:  ?   Head: Normocephalic.  ?   Mouth/Throat:  ?   Pharynx: No oropharyngeal exudate.  ?Eyes:  ?    General: Lids are normal.  ?   Conjunctiva/sclera: Conjunctivae normal.  ?Cardiovascular:  ?   Rate and Rhythm: Normal rate and regular rhythm.  ?   Heart sounds: Normal heart sounds, S1 normal and S2 normal.  ?Pulmonary:  ?   Breath sounds: No decreased breath sounds, wheezing, rhonchi or rales.  ?Abdominal:  ?   Palpations: Abdomen is soft.  ?   Tenderness: There is generalized abdominal tenderness.  ?Musculoskeletal:  ?   Right lower leg: No swelling.  ?   Left lower leg: No swelling.  ?Skin: ?   General: Skin is warm.  ?   Findings: No rash.  ?Neurological:  ?   Mental Status: He is alert and oriented to person, place, and time.  ?  ?Data Reviewed: ?Hb 7.9, PLT 621, Na 130, K3.3 ? ?Family Communication: Update wife on phone ? ?Disposition: ?Status is: Inpatient ?Remains inpatient appropriate because: requires tissue diagnosis procedure today.  Patient will have to have output out of ostomy and tolerate solid foood ? ?Planned Discharge Destination: Home ? ?Author: ?Loletha Grayer, MD ?02/24/2022 12:56 PM ? ?For on call review [email or web address].  ?

## 2022-02-24 NOTE — Anesthesia Preprocedure Evaluation (Signed)
Anesthesia Evaluation  ?Patient identified by MRN, date of birth, ID band ?Patient awake ? ? ? ?Reviewed: ?Allergy & Precautions, H&P , NPO status , Patient's Chart, lab work & pertinent test results, reviewed documented beta blocker date and time  ? ?Airway ?Mallampati: II ? ? ?Neck ROM: full ? ? ? Dental ? ?(+) Poor Dentition ?  ?Pulmonary ?neg pulmonary ROS, Current Smoker,  ?  ?Pulmonary exam normal ? ? ? ? ? ? ? Cardiovascular ?Exercise Tolerance: Poor ?+ CAD  ?Normal cardiovascular exam ?Rhythm:regular Rate:Normal ? ? ?  ?Neuro/Psych ?Anxiety CVA negative psych ROS  ? GI/Hepatic ?negative GI ROS, Neg liver ROS,   ?Endo/Other  ?negative endocrine ROS ? Renal/GU ?Renal disease  ?negative genitourinary ?  ?Musculoskeletal ? ? Abdominal ?  ?Peds ? Hematology ? ?(+) Blood dyscrasia, anemia ,   ?Anesthesia Other Findings ?Past Medical History: ?No date: [facility] joint dislocation ?No date: CAD (coronary artery disease) ?No date: CVA (cerebral vascular accident) ([place]) ?No date: Metatarsal bone fracture ?    Comment:  3rd metatarsal , left foot; April 2021 ?No date: Syncope ?    Comment:  June '22 ?No date: Tobacco abuse ?Past Surgical History: ?09/11/2016: ACROMIO-CLAVICULAR JOINT REPAIR; Left ?    Comment:  Procedure: ACROMIO-CLAVICULAR RECONSTRUCTION;  Surgeon:  ?             Corky Mull, MD;  Location: [facility] ORS;  Service:  ?             Orthopedics;  Laterality: Left;  clavicle ?05/20/2021: LEFT HEART CATH AND CORONARY ANGIOGRAPHY; N/A ?    Comment:  Procedure: LEFT HEART CATH AND CORONARY ANGIOGRAPHY with ?             coronary intervention;  Surgeon: [name], MD;   ?             Location: [facility] CV LAB;  Service: Cardiovascular; ?             Laterality: N/A; ?No date: MANDIBLE RECONSTRUCTION ?No date: TONSILLECTOMY ?    Comment:  AGE 66 ?03/26/2020: XI ROBOTIC ASSISTED INGUINAL HERNIA REPAIR WITH MESH; Right ?    Comment:  Procedure: XI ROBOTIC ASSISTED INGUINAL HERNIA  REPAIR  ?             WITH MESH;  Surgeon: Herbert Pun, MD;   ?             Location: [facility] ORS;  Service: General;  Laterality:  ?             Right; ?BMI   ? Body Mass Index: 19.70 kg/m?  ?  ? Reproductive/Obstetrics ?negative OB ROS ? ?  ? ? ? ? ? ? ? ? ? ? ? ? ? ?  ?  ? ? ? ? ? ? ? ? ?Anesthesia Physical ?Anesthesia Plan ? ?ASA: 3 ? ?Anesthesia Plan: General  ? ?Post-op Pain Management:   ? ?Induction:  ? ?PONV Risk Score and Plan:  ? ?Airway Management Planned:  ? ?Additional Equipment:  ? ?Intra-op Plan:  ? ?Post-operative Plan:  ? ?Informed Consent: I have reviewed the patients History and Physical, chart, labs and discussed the procedure including the risks, benefits and alternatives for the proposed anesthesia with the patient or authorized representative who has indicated his/her understanding and acceptance.  ? ? ? ?Dental Advisory Given ? ?Plan Discussed with: CRNA ? ?Anesthesia Plan Comments:   ? ? ? ? ? ? ?Anesthesia Quick Evaluation ? ?

## 2022-02-24 NOTE — Transfer of Care (Signed)
Immediate Anesthesia Transfer of Care Note ? ?Patient: Kyle Larson ? ?Procedure(s) Performed: FLEXIBLE SIGMOIDOSCOPY ? ?Patient Location: PACU ? ?Anesthesia Type:General ? ?Level of Consciousness: sedated ? ?Airway & Oxygen Therapy: Patient Spontanous Breathing and Patient connected to nasal cannula oxygen ? ?Post-op Assessment: Report given to RN and Post -op Vital signs reviewed and stable ? ?Post vital signs: Reviewed and stable ? ?Last Vitals:  ?Vitals Value Taken Time  ?BP 132/90 02/24/22 1313  ?Temp 36.8 ?C 02/24/22 1313  ?Pulse 87 02/24/22 1313  ?Resp 20 02/24/22 1313  ?SpO2 90 % 02/24/22 1313  ? ? ?Last Pain:  ?Vitals:  ? 02/24/22 1313  ?TempSrc: Temporal  ?PainSc: Asleep  ?   ? ?  ? ?Complications: No notable events documented. ?

## 2022-02-24 NOTE — Consult Note (Signed)
Patient not available for consultation earlier today.  ? 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  CEA mildly elevated at 6.7.  Patient will require an MRI of the pelvis for official staging work-up as well as CT of the chest. ? ?If patient is discharged, these can be accomplished as an outpatient. ? ?Appreciate consult, will follow. ?

## 2022-02-24 NOTE — Plan of Care (Signed)
  Problem: Education: Goal: Knowledge of General Education information will improve Description: Including pain rating scale, medication(s)/side effects and non-pharmacologic comfort measures Outcome: Progressing   Problem: Health Behavior/Discharge Planning: Goal: Ability to manage health-related needs will improve Outcome: Progressing   Problem: Clinical Measurements: Goal: Ability to maintain clinical measurements within normal limits will improve Outcome: Progressing Goal: Will remain free from infection Outcome: Progressing Goal: Diagnostic test results will improve Outcome: Progressing Goal: Respiratory complications will improve Outcome: Progressing Goal: Cardiovascular complication will be avoided Outcome: Progressing   Problem: Activity: Goal: Risk for activity intolerance will decrease Outcome: Progressing   Problem: Nutrition: Goal: Adequate nutrition will be maintained Outcome: Progressing   Problem: Coping: Goal: Level of anxiety will decrease Outcome: Progressing   Problem: Elimination: Goal: Will not experience complications related to bowel motility Outcome: Progressing Goal: Will not experience complications related to urinary retention Outcome: Progressing   Problem: Pain Managment: Goal: General experience of comfort will improve Outcome: Progressing   Problem: Safety: Goal: Ability to remain free from injury will improve Outcome: Progressing   Problem: Skin Integrity: Goal: Risk for impaired skin integrity will decrease Outcome: Progressing   Problem: Education: Goal: Knowledge of ostomy care will improve Outcome: Progressing Goal: Understanding of discharge needs will improve Outcome: Progressing   Problem: Bowel/Gastric/Urinary: Goal: Gastrointestinal status for postoperative course will improve Outcome: Progressing   Problem: Coping: Goal: Coping ability will improve Outcome: Progressing   Problem: Fluid Volume: Goal: Ability to  achieve a balanced intake and output will improve Outcome: Progressing   Problem: Health Behavior/Discharge Planning: Goal: Ability to manage health-related needs will improve Outcome: Progressing   Problem: Nutrition: Goal: Will attain and maintain optimal nutritional status will improve Outcome: Progressing   Problem: Clinical Measurements: Goal: Postoperative complications will be avoided or minimized Outcome: Progressing   Problem: Skin Integrity: Goal: Will show signs of wound healing Outcome: Progressing Goal: Risk for impaired skin integrity will decrease Outcome: Progressing   

## 2022-02-24 NOTE — Progress Notes (Signed)
[facility] ?SURGICAL PROGRESS NOTE ? ?Hospital Day(s): 2.  ? ?Post op day(s): 2 Days Post-Op.  ? ?Interval History:  ?Patient seen and examined ?No acute events or new complaints overnight.  ?Patient reports he is doing better; frustrated by loose stools  ?Leukocytosis is improving; WBC 22.2K ?Hgb to 7.9 ?Renal function normal; sCr - 0.57; UO - unmeasured ?Mild hypokalemia to 3.3  ?He has had output from both colostomy and rectum ?He is NPO this morning for planned flexible sigmoidoscopy for tissue diagnosis; Was on FLD yesterday ? ?Vital signs in last 24 hours: [min-max] current  ?Temp:  [97.5 ?F (36.4 ?C)-98.3 ?F (36.8 ?C)] 98 ?F (36.7 ?C) (04/03 8676) ?Pulse Rate:  [62-84] 84 (04/03 0513) ?Resp:  [18-20] 18 (04/03 0513) ?BP: (98-123)/(68-74) 123/71 (04/03 0513) ?SpO2:  [95 %-100 %] 96 % (04/03 0513)     Height: 6' (182.9 cm) Weight: 65.9 kg BMI (Calculated): 19.7  ? ?Intake/Output last 2 shifts:  ?04/02 0701 - 04/03 0700 ?In: 888.4 [P.O.:118; I.V.:720.4; IV Piggyback:50] ?Out: 0   ? ?Physical Exam:  ?Constitutional: alert, cooperative and no distress  ?Respiratory: breathing non-labored at rest  ?Cardiovascular: regular rate and sinus rhythm  ?Gastrointestinal: Soft, incisional soreness, non-distended, no rebound/guarding. Colostomy in left mid abdomen; pink; patent; gas in bag ?Integumentary: Laparoscopic incisions are healing well; no erythema  ? ?Labs:  ? ?  Latest Ref Rng & Units 02/23/2022  ?  5:59 AM 02/22/2022  ?  2:58 PM 02/05/2022  ?  3:56 PM  ?CBC  ?WBC 4.0 - 10.5 K/uL 23.2   21.1   12.4    ?Hemoglobin 13.0 - 17.0 g/dL 8.2   9.9   9.6    ?Hematocrit 39.0 - 52.0 % 25.7   31.2   31.1    ?Platelets 150 - 400 K/uL 584   727   540    ? ? ?  Latest Ref Rng & Units 02/23/2022  ?  5:59 AM 02/22/2022  ?  2:58 PM 02/05/2022  ?  3:56 PM  ?CMP  ?Glucose 70 - 99 mg/dL 160   120   107    ?BUN 8 - 23 mg/dL '7   6   5    '$ ?Creatinine 0.61 - 1.24 mg/dL 0.63   0.56   0.60    ?Sodium 135 - 145 mmol/L 131   128   130     ?Potassium 3.5 - 5.1 mmol/L 3.5   3.6   3.7    ?Chloride 98 - 111 mmol/L 101   94   96    ?CO2 22 - 32 mmol/L '23   24   24    '$ ?Calcium 8.9 - 10.3 mg/dL 7.8   9.0   9.1    ?Total Protein 6.5 - 8.1 g/dL 5.5   7.1   7.7    ?Total Bilirubin 0.3 - 1.2 mg/dL 0.4   0.6   0.5    ?Alkaline Phos 38 - 126 U/L 52   68   58    ?AST 15 - 41 U/L '15   16   16    '$ ?ALT 0 - 44 U/L '10   12   9    '$ ? ? ? ?Imaging studies: No new pertinent imaging studies ? ? ?Assessment/Plan: ?66 y.o. male 2 Days Post-Op s/p laparoscopic diverting loop sigmoid colostomy for metastatic colorectal mass resulting in large bowel obstruction ? ? - Appreciate GI assistance; Plan for flexible sigmoidoscopy for tissue diagnosis today ? -  NPO for GI procedure; okay to resume and advance diet after ? - Monitor abdominal examination  ? - Monitor colostomy function; record output; will engage WOC today for teaching/supplies ? - Pain control prn; antiemetics prn ?- Mobilization as tolerated ?- Further management per primary service; we will follow   ? ?All of the above findings and recommendations were discussed with the patient, and the medical team, and all of patient's questions were answered to his expressed satisfaction. ? ?-- ?Edison Simon, PA-C ?[facility] ?02/24/2022, 7:31 AM ?[phone] ?M-F: 7am - 4pm ? ?

## 2022-02-25 ENCOUNTER — Inpatient Hospital Stay: Payer: Medicare Other

## 2022-02-25 ENCOUNTER — Telehealth: Payer: Self-pay

## 2022-02-25 ENCOUNTER — Other Ambulatory Visit: Payer: Self-pay

## 2022-02-25 ENCOUNTER — Other Ambulatory Visit: Payer: Self-pay | Admitting: Anatomic Pathology & Clinical Pathology

## 2022-02-25 DIAGNOSIS — C20 Malignant neoplasm of rectum: Secondary | ICD-10-CM

## 2022-02-25 DIAGNOSIS — K6389 Other specified diseases of intestine: Secondary | ICD-10-CM | POA: Diagnosis not present

## 2022-02-25 DIAGNOSIS — E43 Unspecified severe protein-calorie malnutrition: Secondary | ICD-10-CM

## 2022-02-25 LAB — TYPE AND SCREEN
ABO/RH(D): O POS
ABO/RH(D): O POS
Antibody Screen: NEGATIVE
Antibody Screen: NEGATIVE
Unit division: 0
Unit division: 0

## 2022-02-25 LAB — BASIC METABOLIC PANEL
Anion gap: 7 (ref 5–15)
BUN: 9 mg/dL (ref 8–23)
CO2: 25 mmol/L (ref 22–32)
Calcium: 7.8 mg/dL — ABNORMAL LOW (ref 8.9–10.3)
Chloride: 102 mmol/L (ref 98–111)
Creatinine, Ser: 0.53 mg/dL — ABNORMAL LOW (ref 0.61–1.24)
GFR, Estimated: >60 mL/min (ref >60)
Glucose, Bld: 90 mg/dL (ref 70–99)
Potassium: 3.4 mmol/L — ABNORMAL LOW (ref 3.5–5.1)
Sodium: 134 mmol/L — ABNORMAL LOW (ref 135–145)

## 2022-02-25 LAB — CBC
HCT: 25.5 % — ABNORMAL LOW (ref 39.0–52.0)
Hemoglobin: 8.1 g/dL — ABNORMAL LOW (ref 13.0–17.0)
MCH: 24.5 pg — ABNORMAL LOW (ref 26.0–34.0)
MCHC: 31.8 g/dL (ref 30.0–36.0)
MCV: 77.3 fL — ABNORMAL LOW (ref 80.0–100.0)
Platelets: 607 10*3/uL — ABNORMAL HIGH (ref 150–400)
RBC: 3.3 MIL/uL — ABNORMAL LOW (ref 4.22–5.81)
RDW: 19.5 % — ABNORMAL HIGH (ref 11.5–15.5)
WBC: 16.5 10*3/uL — ABNORMAL HIGH (ref 4.0–10.5)
nRBC: 0 % (ref 0.0–0.2)

## 2022-02-25 LAB — BPAM RBC
Blood Product Expiration Date: 202305052359
Blood Product Expiration Date: 202305052359
Unit Type and Rh: 5100
Unit Type and Rh: 5100

## 2022-02-25 LAB — PREPARE RBC (CROSSMATCH)

## 2022-02-25 LAB — SURGICAL PATHOLOGY

## 2022-02-25 MED ORDER — OXYCODONE HCL 5 MG PO TABS: 5.0000 mg | ORAL_TABLET | Freq: Four times a day (QID) | ORAL | 0 refills | Status: DC | PRN

## 2022-02-25 MED ORDER — ACETAMINOPHEN 325 MG PO TABS: 650.0000 mg | ORAL_TABLET | Freq: Four times a day (QID) | ORAL | Status: DC | PRN

## 2022-02-25 MED ORDER — FOLIC ACID 1 MG PO TABS: 1.0000 mg | ORAL_TABLET | Freq: Every day | ORAL | 0 refills | Status: DC

## 2022-02-25 MED ORDER — FERROUS SULFATE 325 (65 FE) MG PO TABS: 325.0000 mg | ORAL_TABLET | Freq: Every day | ORAL | 0 refills | Status: DC

## 2022-02-25 MED ORDER — AMOXICILLIN-POT CLAVULANATE 875-125 MG PO TABS: 1.0000 | ORAL_TABLET | Freq: Two times a day (BID) | ORAL | 0 refills | Status: AC

## 2022-02-25 MED ORDER — IOHEXOL 300 MG/ML  SOLN
75.0000 mL | Freq: Once | INTRAMUSCULAR | Status: AC | PRN
  Administered 2022-02-25: 75 mL via INTRAVENOUS

## 2022-02-25 MED ORDER — POTASSIUM CHLORIDE CRYS ER 20 MEQ PO TBCR
40.0000 meq | EXTENDED_RELEASE_TABLET | Freq: Once | ORAL | Status: AC
Start: 2022-02-25 — End: 2022-02-25
  Administered 2022-02-25: 40 meq via ORAL
  Filled 2022-02-25: qty 2

## 2022-02-25 MED ORDER — TAMSULOSIN HCL 0.4 MG PO CAPS
0.4000 mg | ORAL_CAPSULE | Freq: Every day | ORAL | 0 refills | Status: DC
Start: 2022-02-25 — End: 2022-04-14

## 2022-02-25 MED ORDER — ATORVASTATIN CALCIUM 40 MG PO TABS: 40.0000 mg | ORAL_TABLET | Freq: Every day | ORAL | 0 refills | Status: DC

## 2022-02-25 MED ORDER — ENSURE ENLIVE PO LIQD: 237.0000 mL | Freq: Three times a day (TID) | ORAL | 0 refills | Status: DC

## 2022-02-25 MED ORDER — THIAMINE HCL 100 MG PO TABS: 100.0000 mg | ORAL_TABLET | Freq: Every day | ORAL | 0 refills | Status: DC

## 2022-02-25 NOTE — TOC Initial Note (Signed)
Transition of Care (TOC) - Initial/Assessment Note  ? ? ?Patient Details  ?Name: Kyle Larson ?MRN: [id number] ?Date of Birth: 1956/06/20 ? ?Transition of Care (TOC) CM/SW Contact:    ?Beverly Sessions, RN ?Phone Number: ?02/25/2022, 1:28 PM ? ?Clinical Narrative:                 ?Admitted for: s/p new ostomy  ?Admitted from: home with wife ?PCP: Humphrey Rolls ?Current home health/prior home health/DME:NA ? ?Patient to discharge today.  ?Wife to transport at discharge ?Patient agreeable to home health RN for ostomy.  Patient states he does not have a preference of home health agency.  Referral made and accepted by Corene Cornea with [facility] ?Patient declines substance abuse resources  ? ? ?Expected Discharge Plan: [place] ?Barriers to Discharge: Barriers Resolved ? ? ?Patient Goals and CMS Choice ?  ?CMS Medicare.gov Compare Post Acute Care list provided to:: Patient ?Choice offered to / list presented to : Patient ? ?Expected Discharge Plan and Services ?Expected Discharge Plan: [place] ?  ?Discharge Planning Services: CM Consult ?Post Acute Care Choice: Home Health ?Living arrangements for the past 2 months: [place] ?Expected Discharge Date: 02/25/22               ?  ?  ?  ?  ?  ?HH Arranged: RN ?[place] Agency: Microbiologist ([place]) ?Date HH Agency Contacted: 02/25/22 ?  ?Representative spoke with at [facility]: Corene Cornea ? ?Prior Living Arrangements/Services ?Living arrangements for the past 2 months: [place] ?Lives with:: Spouse ?Patient language and need for interpreter reviewed:: Yes ?Do you feel safe going back to the place where you live?: Yes      ?Need for Family Participation in Patient Care: Yes (Comment) ?Care giver support system in place?: Yes (comment) ?  ?Criminal Activity/Legal Involvement Pertinent to Current Situation/Hospitalization: No - Comment as needed ? ?Activities of Daily Living ?Home Assistive Devices/Equipment: Eyeglasses ?ADL  Screening (condition at time of admission) ?Patient's cognitive ability adequate to safely complete daily activities?: Yes ?Is the patient deaf or have difficulty hearing?: No ?Does the patient have difficulty seeing, even when wearing glasses/contacts?: No ?Does the patient have difficulty concentrating, remembering, or making decisions?: No ?Patient able to express need for assistance with ADLs?: Yes ?Does the patient have difficulty dressing or bathing?: No ?Independently performs ADLs?: Yes (appropriate for developmental age) ?Does the patient have difficulty walking or climbing stairs?: No ?Weakness of Legs: Both ?Weakness of Arms/Hands: Both ? ?Permission Sought/Granted ?  ?  ?   ?   ?   ?   ? ?Emotional Assessment ?  ?  ?  ?Orientation: : Oriented to Self, Oriented to Place, Oriented to  Time, Oriented to Situation ?Alcohol / Substance Use: Not Applicable ?Psych Involvement: No (comment) ? ?Admission diagnosis:  Colonic mass [K63.89] ?Patient Active Problem List  ? Diagnosis Date Noted  ? Protein-calorie malnutrition, severe 02/24/2022  ? BPH (benign prostatic hyperplasia) 02/23/2022  ? Thrombocytosis 02/23/2022  ? Colonic mass 02/22/2022  ? Hydronephrosis   ? Coronary artery disease due to lipid rich plaque   ? Completed stroke ([place]) 01/06/2022  ? Anxiety 01/06/2022  ? Iron deficiency anemia 01/06/2022  ? Diarrhea 01/06/2022  ? Chest pain 05/20/2021  ? Hyponatremia 05/20/2021  ? Leukocytosis 05/20/2021  ? Alcohol abuse 05/20/2021  ? Tobacco abuse   ? Syncope 05/19/2021  ? Separation of left acromioclavicular joint 05/21/2016  ? ?PCP:  [name], MD ?Pharmacy:   ?[id number] [place] [address]?Phone: [phone]: [phone]? ? ? ? ?Social Determinants of Health (SDOH) Interventions ?  ? ?Readmission Risk Interventions ?   ? View : No data to display.  ?  ?  ?  ? ? ? ?

## 2022-02-25 NOTE — Telephone Encounter (Signed)
MRI and hospital follow up have been arranged. Call placed to Kyle Larson to review and go over instructions. Received voicemail. Left brief message. Will call back at a later time. ?

## 2022-02-25 NOTE — Discharge Instructions (Signed)
In addition to included general post-operative instructions, ? ?Diet: Resume home diet.  ? ?Activity: No heavy lifting >20 pounds (children, pets, laundry, garbage) for 4 weeks, but light activity and walking are encouraged. Do not drive or drink alcohol if taking narcotic pain medications or having pain that might distract from driving. ? ?Wound care: You may shower/get incision wet with soapy water and pat dry (do not rub incisions), but no baths or submerging incision underwater until follow-up. Monitor colostomy output.  ? ?Medications: Resume all home medications. For mild to moderate pain: acetaminophen (Tylenol) or ibuprofen/naproxen (if no kidney disease). Combining Tylenol with alcohol can substantially increase your risk of causing liver disease. Narcotic pain medications, if prescribed, can be used for severe pain, though may cause nausea, constipation, and drowsiness. Do not combine Tylenol and Percocet (or similar) within a 6 hour period as Percocet (and similar) contain(s) Tylenol. If you do not need the narcotic pain medication, you do not need to fill the prescription. ? ?Call office [phone] / [phone]) at any time if any questions, worsening pain, fevers/chills, bleeding, drainage from incision site, or other concerns. ? ?

## 2022-02-25 NOTE — Progress Notes (Signed)
Patient colostomy bag was off when nurse rounded on patient. New bag placed and patient educated on making sure he keeps check on his bag, especially when it gives off a strong odor. Bag intact. ?

## 2022-02-25 NOTE — Progress Notes (Addendum)
[facility] ?SURGICAL PROGRESS NOTE ? ?Hospital Day(s): 3.  ? ?Post op day(s): 1 Day Post-Op.  ? ?Interval History:  ?Patient seen and examined ?No acute events or new complaints overnight.  ?Patient reports he is doing better; frustrated by loose stools  ?Leukocytosis is improving; WBC 16.5K ?Hgb to 7.1; improved ?Renal function normal; sCr - 0.53; UO - unmeasured ?Mild hypokalemia to 3.4 ?He has had output from colostomy; changed just prior to examination ?Flexible sigmoidoscopy yesterday for tissue diagnosis; oncology on board ?He is on soft diet; tolerating well ? ?Vital signs in last 24 hours: [min-max] current  ?Temp:  [96.9 ?F (36.1 ?C)-98.6 ?F (37 ?C)] 97.7 ?F (36.5 ?C) (04/04 1157) ?Pulse Rate:  [87-107] 90 (04/04 0437) ?Resp:  [16-20] 20 (04/04 0437) ?BP: (105-143)/(67-92) 125/84 (04/04 0437) ?SpO2:  [90 %-99 %] 96 % (04/04 0437) ?Weight:  [67.8 kg-70.2 kg] 67.8 kg (04/04 0500)     Height: 6' (182.9 cm) Weight: 67.8 kg BMI (Calculated): 20.27  ? ?Intake/Output last 2 shifts:  ?04/03 0701 - 04/04 0700 ?In: 610.6 [P.O.:180; I.V.:300; IV Piggyback:130.6] ?Out: 2300 [Urine:2300]  ? ?Physical Exam:  ?Constitutional: alert, cooperative and no distress  ?Respiratory: breathing non-labored at rest  ?Cardiovascular: regular rate and sinus rhythm  ?Gastrointestinal: Soft, incisional soreness, non-distended, no rebound/guarding. Colostomy in left mid abdomen; pink; patent; gas in bag ?Integumentary: Laparoscopic incisions are healing well; no erythema  ? ?Labs:  ? ?  Latest Ref Rng & Units 02/25/2022  ?  6:26 AM 02/24/2022  ?  6:27 AM 02/23/2022  ?  5:59 AM  ?CBC  ?WBC 4.0 - 10.5 K/uL 16.5   22.2   23.2    ?Hemoglobin 13.0 - 17.0 g/dL 8.1   7.9   8.2    ?Hematocrit 39.0 - 52.0 % 25.5   25.3   25.7    ?Platelets 150 - 400 K/uL 607   621   584    ? ? ?  Latest Ref Rng & Units 02/25/2022  ?  6:26 AM 02/24/2022  ?  6:27 AM 02/23/2022  ?  5:59 AM  ?CMP  ?Glucose 70 - 99 mg/dL 90   101   160    ?BUN 8 - 23 mg/dL '9    11   7    '$ ?Creatinine 0.61 - 1.24 mg/dL 0.53   0.57   0.63    ?Sodium 135 - 145 mmol/L 134   130   131    ?Potassium 3.5 - 5.1 mmol/L 3.4   3.3   3.5    ?Chloride 98 - 111 mmol/L 102   100   101    ?CO2 22 - 32 mmol/L '25   24   23    '$ ?Calcium 8.9 - 10.3 mg/dL 7.8   7.8   7.8    ?Total Protein 6.5 - 8.1 g/dL   5.5    ?Total Bilirubin 0.3 - 1.2 mg/dL   0.4    ?Alkaline Phos 38 - 126 U/L   52    ?AST 15 - 41 U/L   15    ?ALT 0 - 44 U/L   10    ? ? ? ?Imaging studies: No new pertinent imaging studies ? ? ?Assessment/Plan: ?66 y.o. male 1 Day Post-Op s/p laparoscopic diverting loop sigmoid colostomy for metastatic colorectal mass resulting in large bowel obstruction ? ? - Okay to continue soft diet  ? - Monitor abdominal examination  ? - Monitor colostomy function; record output; WOC  on board  ? - Pain control prn; antiemetics prn ?- Mobilization as tolerated ? - Appreciate GI assistance ? - Oncology on board; recs reviewed ?- Further management per primary service ? ?- Discharge planning; Nothing further to add from surgical perspective, we will sign off. He can follow up in ~1 month for recheck. Follow up with oncology and recommendations (CT Chest / MRI) ? ?All of the above findings and recommendations were discussed with the patient, and the medical team, and all of patient's questions were answered to his expressed satisfaction. ? ?-- ?Edison Simon, PA-C ?[facility] ?02/25/2022, 7:45 AM ?[phone] ?M-F: 7am - 4pm ? ?

## 2022-02-25 NOTE — Assessment & Plan Note (Signed)
With adenocarcinoma. ?

## 2022-02-25 NOTE — Discharge Summary (Signed)
?Physician Discharge Summary ?  ?Patient: Kyle Larson MRN: [id number] DOB: 08-14-1956  ?Admit date:     02/22/2022  ?Discharge date: 02/25/22  ?Discharge Physician: Loletha Grayer  ? ?PCP: [name], MD  ? ?Recommendations at discharge:  ? ?Follow-up PCP 5 days. ?Follow-up Dr. Grayland Ormond in a few days ?Follow-up with Dr. Dahlia Byes general surgery ? ?Discharge Diagnoses: ?Principal Problem: ?  Colonic mass ?Active Problems: ?  Hydronephrosis ?  Alcohol abuse ?  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  Coronary artery disease due to lipid rich plaque ?  Tobacco abuse ?  BPH (benign prostatic hyperplasia) ?  Hyponatremia ?  Iron deficiency anemia ?  Thrombocytosis ?  Protein-calorie malnutrition, severe ? ? ? ?Hospital Course: ?66 year old man with past medical history of CVA, CAD presented to the hospital with severe abdominal pain.  CT scan of the abdomen and pelvis showed a large necrotic rectal mass measuring 8.8 x 7.2 x 8.9 cm with partial obstruction and retention of stool in the colon.  Urinary bladder showed thickening with hydronephrosis secondary to tumor invasion of large rectal mass.  The patient had multiple small nodes which could be metastatic disease.  The patient was brought urgently for a diverting colostomy by Dr. Dahlia Byes.  Dr. Alice Reichert did a flexible sigmoidoscopy on 02/24/2022 to get a tissue diagnosis.  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  Patient was seen by Dr. Grayland Ormond oncology and will follow up with him to set up treatment plan as outpatient.  CT scan of the chest shows small bilateral pleural effusions and central airway thickening and patchy pulmonary opacities that could reflect edema or atypical inflammation.  Assessment of metastatic disease limited by acute findings.  The patient was given empiric Zosyn upon coming in and will be given a few more days of Augmentin upon discharge. ? ? ?Assessment and Plan: ?* Colonic mass ?Patient had a urgent diverting colostomy on 02/23/2022 by  Dr. Dahlia Byes.  Dr. Alice Reichert gastroenterology to do a flexible sigmoidoscopy to get tissue diagnosis on 02/24/2022.  CEA only 6.7.  CT scan of the chest done here.  MRI of the abdomen can be done with Dr. Grayland Ormond as outpatient.  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 ? ?Hydronephrosis ?Creatinine good.  Noo need for nephrostomy tubes currently ? ?Alcohol abuse ?No signs of withdrawal currently. ? ?Completed stroke ([place]) ?Spoke with patient and wife and they both wanted to hold the aspirin and Plavix since he still had some rectal bleeding.  And patient anemic.  Higher risk of stroke off medications. ? ?Coronary artery disease due to lipid rich plaque ?Holding aspirin Plavix with anemia and rectal bleeding.  ? ?Tobacco abuse ?Continue nicotine patch ? ?BPH (benign prostatic hyperplasia) ?Continue Flomax ? ?Protein-calorie malnutrition, severe ?With adenocarcinoma. ? ?Thrombocytosis ?Could be secondary to colonic mass and acute phase reactant or with iron deficiency anemia.  Either way platelet count came down from 727 and is 607 today ? ?Iron deficiency anemia ?Hemoglobin down to 8.1 with fluids.  Ferritin low at 8 in February.  Patient received IV iron during the hospital course.  Follow-up with Dr. Grayland Ormond as outpatient. ? ?Hyponatremia ?Could be secondary to alcohol abuse.  Sodium up to 130 today. ? ? ? ? ?  ? ? ?Consultants: General surgery, gastroenterology, oncology ?Procedures performed: Urgent diverting colostomy, flexible sigmoidoscopy ?Disposition: Home ?Diet recommendation:  ?Cardiac diet ?DISCHARGE MEDICATION: ?Allergies as of 02/25/2022   ?No Known Allergies ?  ? ?  ?Medication List  ?  ? ?STOP taking these medications   ? ?  aspirin 81 MG EC tablet ?  ?clopidogrel 75 MG tablet ?Commonly known as: PLAVIX ?  ? ?  ? ?TAKE these medications   ? ?acetaminophen 325 MG tablet ?Commonly known as: Tylenol ?Take 2 tablets (650 mg total) by mouth every 6 (six) hours as needed for mild pain. ?   ?amoxicillin-clavulanate 875-125 MG tablet ?Commonly known as: Augmentin ?Take 1 tablet by mouth 2 (two) times daily for 3 days. ?  ?atorvastatin 40 MG tablet ?Commonly known as: LIPITOR ?Take 1 tablet (40 mg total) by mouth daily. ?  ?feeding supplement Liqd ?Take 237 mLs by mouth 3 (three) times daily between meals. ?  ?ferrous sulfate 325 (65 FE) MG tablet ?Take 1 tablet (325 mg total) by mouth daily. ?  ?folic acid 1 MG tablet ?Commonly known as: FOLVITE ?Take 1 tablet (1 mg total) by mouth daily. ?  ?multivitamin with minerals tablet ?Take 1 tablet by mouth daily. ?  ?nicotine 21 mg/24hr patch ?Commonly known as: NICODERM CQ - dosed in mg/24 hours ?Place 21 mg onto the skin daily. ?  ?oxyCODONE 5 MG immediate release tablet ?Commonly known as: Oxy IR/ROXICODONE ?Take 1 tablet (5 mg total) by mouth every 6 (six) hours as needed for severe pain. ?  ?tamsulosin 0.4 MG Caps capsule ?Commonly known as: FLOMAX ?Take 1 capsule (0.4 mg total) by mouth daily after supper. ?  ?thiamine 100 MG tablet ?Take 1 tablet (100 mg total) by mouth daily. ?  ? ?  ? ? Follow-up Information   ? ? Pabon, [place] F, MD. Schedule an appointment as soon as possible for a visit in 4 week(s).   ?Specialty: General Surgery ?Why: s/p laparoscopic diverting loop colostomy ?Contact information: ?[address] ?[address] ?[place] [address] ?[phone] ? ? ?  ?  ? ? [name], MD Follow up in 1 week(s).   ?Specialty: Cardiology ?Contact information: ?[place] ?[place] [address] ?[phone] ? ? ?  ?  ? ? Lloyd Huger, MD Follow up in 3 day(s).   ?Specialty: Oncology ?Contact information: ?[place] ?[place] [address] ?[phone] ? ? ?  ?  ? ? [place]. Schedule an appointment as soon as possible for a visit in 1 week(s).   ?Specialty: General Surgery ?Why: New diverting colostomy ?Contact information: ?[address] ?[phone] mc ?[place]  [place] ?[phone] ? ?  ?  ? ?  ?  ? ?  ? ?Discharge Exam: ?Filed Weights  ? 02/22/22 2328 02/24/22 1600 02/25/22 0500  ?Weight: 65.9 kg 70.2 kg 67.8 kg  ? ?Physical Exam ?HENT:  ?   Head: Normocephalic.  ?   Mouth/Throat:  ?   Pharynx: No oropharyngeal exudate.  ?Eyes:  ?   General: Lids are normal.  ?   Conjunctiva/sclera: Conjunctivae normal.  ?Cardiovascular:  ?   Rate and Rhythm: Normal rate and regular rhythm.  ?   Heart sounds: Normal heart sounds, S1 normal and S2 normal.  ?Pulmonary:  ?   Breath sounds: No decreased breath sounds, wheezing, rhonchi or rales.  ?Abdominal:  ?   Palpations: Abdomen is soft.  ?   Tenderness: There is no abdominal tenderness.  ?Musculoskeletal:  ?   Right lower leg: No swelling.  ?   Left lower leg: No swelling.  ?Skin: ?   General: Skin is warm.  ?   Findings: No rash.  ?Neurological:  ?   Mental Status: He is alert and oriented to  person, place, and time.  ?  ? ?Condition at discharge: fair ? ?The results of significant diagnostics from this hospitalization (including imaging, microbiology, ancillary and laboratory) are listed below for reference.  ? ?Imaging Studies: ?CT CHEST W CONTRAST ? ?Result Date: 02/25/2022 ?CLINICAL DATA:  Metastatic colon cancer, staging. * Tracking Code: BO * EXAM: CT CHEST WITH CONTRAST TECHNIQUE: Multidetector CT imaging of the chest was performed during intravenous contrast administration. RADIATION DOSE REDUCTION: This exam was performed according to the departmental dose-optimization program which includes automated exposure control, adjustment of the mA and/or kV according to patient size and/or use of iterative reconstruction technique. CONTRAST:  54m OMNIPAQUE IOHEXOL 300 MG/ML  SOLN COMPARISON:  Abdominopelvic CT 02/22/2022.  Chest CTA 05/19/2021. FINDINGS: Cardiovascular: Multi-vessel coronary artery atherosclerosis with lesser involvement of the aorta and great vessels. No acute vascular findings are identified. The heart size is normal.  There is no pericardial effusion. Mediastinum/Nodes: Small hilar lymph nodes bilaterally are similar to the previous CT, likely reactive. No enlarged mediastinal or axillary lymph nodes demonstrated. The thyroid gland,

## 2022-02-25 NOTE — Care Management Important Message (Signed)
Important Message ? ?Patient Details  ?Name: Kyle Larson ?MRN: [id number] ?Date of Birth: 13-Mar-1956 ? ? ?Medicare Important Message Given:  Yes ? ? ? ? ?Dannette Barbara ?02/25/2022, 10:26 AM ?

## 2022-02-25 NOTE — Consult Note (Signed)
[facility] Nurse ostomy follow up ?Patient to be discharged after ostomy teaching ? ?Stoma type/location: LUQ; loop colostomy ?Stomal assessment/size: 2", budded, pink, moist  ?Peristomal assessment: intact  ?Treatment options for stomal/peristomal skin: 2" skin barrier ring ?Output brown, thin stool  ?Ostomy pouching: 2pc. 2 3/4" with 2" skin barrier ring  ?Education provided:  ?Met with patient and his wife ?Explained role of ostomy nurse and creation of stoma  ?Explained stoma characteristics (budded, flush, color, texture, care) ?Demonstrated pouch change (cutting new skin barrier, measuring stoma, cleaning peristomal skin and stoma, use of barrier ring) ?Education on emptying when 1/3 to 1/2 full and how to empty ?Demonstrated "burping" flatus from pouch ?Demonstrated use of wick to clean spout  ?Discussed bathing, diet, gas, medication use, constipation ?Patient to have HHRN to further support teaching.  Sending home with 5 skin barrier/pouches and barrier ring. Reviewed educational materials with patient's wife. Provided WOC contact for questions. Requested referral from surgery team for outpatient ostomy clinic. ?Talked with home care liaison about lack of education prior to discharge.  ?  ?Enrolled patient in [facility] Discharge program: Yes ? ?Kyle Larson [facility], CNS, CWON-AP ?[phone]  ?

## 2022-02-25 NOTE — Consult Note (Signed)
?[facility]  ?[phone] [phone] ? ?ID: Kyle Larson OB: August 08, 1956  MR#: [id number]  ZHY#:[id number] ? ?Patient Care Team: ?[name], MD as PCP - General (Cardiology) ? ?CHIEF COMPLAINT: Rectal cancer. ? ?INTERVAL HISTORY: Patient is a 66 year old male who recently presented to the emergency room with increasing abdominal pain.  Subsequent work-up included CT scan revealed a nearly 9 cm partially obstructing rectal mass.  He subsequently underwent diverting colostomy.  Biopsy confirmed malignancy.  Patient feels improved and is being discharged from hospital today.  He has no neurologic complaints.  He denies any recent fevers or illnesses.  He has a fair appetite, but denies weight loss.  He has no chest pain, shortness of breath, cough, or hemoptysis.  He denies any further abdominal pain.  He has no nausea, vomiting, constipation, or diarrhea.  He did not report any melena or hematochezia.  He has no urinary complaints.  Patient offers no further specific complaints. ? ?REVIEW OF SYSTEMS:   ?Review of Systems  ?Constitutional: Negative.  Negative for fever, malaise/fatigue and weight loss.  ?Respiratory: Negative.  Negative for cough, hemoptysis and shortness of breath.   ?Cardiovascular: Negative.  Negative for chest pain and leg swelling.  ?Gastrointestinal: Negative.  Negative for abdominal pain, constipation, diarrhea, melena, nausea and vomiting.  ?Genitourinary: Negative.  Negative for dysuria.  ?Musculoskeletal: Negative.  Negative for back pain and neck pain.  ?Skin: Negative.  Negative for rash.  ?Neurological: Negative.  Negative for dizziness, focal weakness, weakness and headaches.  ?Psychiatric/Behavioral: Negative.  The patient is not nervous/anxious.   ? ?As per HPI. Otherwise, a complete review of systems is negative. ? ?PAST MEDICAL HISTORY: ?Past Medical History:  ?Diagnosis Date  ? AC joint dislocation   ? CAD (coronary artery disease)   ?  CVA (cerebral vascular accident) [facility])   ? Metatarsal bone fracture   ? 3rd metatarsal , left foot; April 2021  ? Syncope   ? June '22  ? Tobacco abuse   ? ? ?PAST SURGICAL HISTORY: ?Past Surgical History:  ?Procedure Laterality Date  ? ACROMIO-CLAVICULAR JOINT REPAIR Left 09/11/2016  ? Procedure: ACROMIO-CLAVICULAR RECONSTRUCTION;  Surgeon: Corky Mull, MD;  Location: [facility] ORS;  Service: Orthopedics;  Laterality: Left;  clavicle  ? FLEXIBLE SIGMOIDOSCOPY N/A 02/24/2022  ? Procedure: FLEXIBLE SIGMOIDOSCOPY;  Surgeon: Lesly Rubenstein, MD;  Location: [facility] ENDOSCOPY;  Service: Endoscopy;  Laterality: N/A;  ? LEFT HEART CATH AND CORONARY ANGIOGRAPHY N/A 05/20/2021  ? Procedure: LEFT HEART CATH AND CORONARY ANGIOGRAPHY with coronary intervention;  Surgeon: [name], MD;  Location: [facility];  Service: Cardiovascular;  Laterality: N/A;  ? MANDIBLE RECONSTRUCTION    ? TONSILLECTOMY    ? AGE 86  ? XI ROBOTIC ASSISTED INGUINAL HERNIA REPAIR WITH MESH Right 03/26/2020  ? Procedure: XI ROBOTIC ASSISTED INGUINAL HERNIA REPAIR WITH MESH;  Surgeon: Herbert Pun, MD;  Location: [facility] ORS;  Service: General;  Laterality: Right;  ? ? ?FAMILY HISTORY: ?Family History  ?Problem Relation Age of Onset  ? Diabetes Mother   ? Prostate cancer Father   ? ? ?ADVANCED DIRECTIVES (Y/N):  '@ADVDIR'$ @ ? ?HEALTH MAINTENANCE: ?Social History  ? ?Tobacco Use  ? Smoking status: Some Days  ?  Packs/day: 0.25  ?  Years: 15.00  ?  Pack years: 3.75  ?  Types: Cigarettes  ?  Passive exposure: Current  ? Smokeless tobacco: Never  ?Vaping Use  ? Vaping Use: Never used  ?Substance Use Topics  ?  Alcohol use: Yes  ?  Alcohol/week: 42.0 standard drinks  ?  Types: 42 Cans of beer per week  ?  Comment: 3 every other day  ? Drug use: No  ? ? ? Colonoscopy: ? PAP: ? Bone density: ? Lipid panel: ? ?No Known Allergies ? ?Current Facility-Administered Medications  ?Medication Dose Route Frequency Provider Last Rate Last Admin  ? acetaminophen  (TYLENOL) tablet 1,000 mg  1,000 mg Oral Q6H Pabon, Diego F, MD   1,000 mg at 02/25/22 0535  ? Chlorhexidine Gluconate Cloth 2 % PADS 6 each  6 each Topical Daily Loletha Grayer, MD   6 each at 02/25/22 1006  ? feeding supplement (ENSURE ENLIVE / ENSURE PLUS) liquid 237 mL  237 mL Oral TID BM Loletha Grayer, MD   237 mL at 02/25/22 1005  ? folic acid (FOLVITE) tablet 1 mg  1 mg Oral Daily Loletha Grayer, MD   1 mg at 02/25/22 1006  ? HYDROmorphone (DILAUDID) injection 1 mg  1 mg Intravenous Q2H PRN Caroleen Hamman F, MD   1 mg at 02/24/22 1644  ? ketorolac (TORADOL) 15 MG/ML injection 15 mg  15 mg Intravenous Q6H Pabon, Diego F, MD   15 mg at 02/25/22 0535  ? multivitamin with minerals tablet 1 tablet  1 tablet Oral Daily Loletha Grayer, MD   1 tablet at 02/25/22 1006  ? nicotine (NICODERM CQ - dosed in mg/24 hours) patch 21 mg  21 mg Transdermal Daily Pabon, Diego F, MD   21 mg at 02/25/22 1004  ? ondansetron (ZOFRAN) tablet 4 mg  4 mg Oral Q6H PRN Pabon, Diego F, MD      ? Or  ? ondansetron (ZOFRAN) injection 4 mg  4 mg Intravenous Q6H PRN Pabon, Diego F, MD      ? oxyCODONE (Oxy IR/ROXICODONE) immediate release tablet 5 mg  5 mg Oral Q3H PRN Pabon, Diego F, MD      ? piperacillin-tazobactam (ZOSYN) IVPB 3.375 g  3.375 g Intravenous Q8H Pabon, Diego F, MD 12.5 mL/hr at 02/25/22 1003 3.375 g at 02/25/22 1003  ? sodium phosphate (FLEET) 7-19 GM/118ML enema 2 enema  2 enema Rectal UD Croley, [place] M, PA-C      ? tamsulosin (FLOMAX) capsule 0.4 mg  0.4 mg Oral QPC supper Pabon, [place] F, MD   0.4 mg at 02/24/22 1839  ? thiamine tablet 100 mg  100 mg Oral Daily Pabon, [place] F, MD   100 mg at 02/25/22 1005  ? Or  ? thiamine (B-1) injection 100 mg  100 mg Intravenous Daily Pabon, [place] F, MD   100 mg at 02/24/22 0901  ? ? ?OBJECTIVE: ?Vitals:  ? 02/25/22 0437 02/25/22 0826  ?BP: 125/84 118/75  ?Pulse: 90 73  ?Resp: 20 20  ?Temp: 97.7 ?F (36.5 ?C) 98.2 ?F (36.8 ?C)  ?SpO2: 96% 98%  ?   Body mass index is 20.27  kg/m?Marland Kitchen    ECOG FS:0 - Asymptomatic ? ?General: Well-developed, well-nourished, no acute distress. ?Eyes: Pink conjunctiva, anicteric sclera. ?HEENT: Normocephalic, moist mucous membranes. ?Lungs: No audible wheezing or coughing. ?Heart: Regular rate and rhythm. ?Abdomen: Soft, nontender, no obvious distention.  Colostomy bag in place. ?Musculoskeletal: No edema, cyanosis, or clubbing. ?Neuro: Alert, answering all questions appropriately. Cranial nerves grossly intact. ?Skin: No rashes or petechiae noted. ?Psych: Normal affect. ?Lymphatics: No cervical, calvicular, axillary or inguinal LAD. ? ? ?LAB RESULTS: ? ?Lab Results  ?Component Value Date  ? NA 134 (L) 02/25/2022  ?  K 3.4 (L) 02/25/2022  ? CL 102 02/25/2022  ? CO2 25 02/25/2022  ? GLUCOSE 90 02/25/2022  ? BUN 9 02/25/2022  ? CREATININE 0.53 (L) 02/25/2022  ? CALCIUM 7.8 (L) 02/25/2022  ? PROT 5.5 (L) 02/23/2022  ? ALBUMIN 2.2 (L) 02/23/2022  ? AST 15 02/23/2022  ? ALT 10 02/23/2022  ? ALKPHOS 52 02/23/2022  ? BILITOT 0.4 02/23/2022  ? GFRNONAA >60 02/25/2022  ? GFRAA >60 10/03/2016  ? ? ?Lab Results  ?Component Value Date  ? WBC 16.5 (H) 02/25/2022  ? NEUTROABS 8.4 (H) 02/05/2022  ? HGB 8.1 (L) 02/25/2022  ? HCT 25.5 (L) 02/25/2022  ? MCV 77.3 (L) 02/25/2022  ? PLT 607 (H) 02/25/2022  ? ? ? ?STUDIES: ?CT CHEST W CONTRAST ? ?Result Date: 02/25/2022 ?CLINICAL DATA:  Metastatic colon cancer, staging. * Tracking Code: BO * EXAM: CT CHEST WITH CONTRAST TECHNIQUE: Multidetector CT imaging of the chest was performed during intravenous contrast administration. RADIATION DOSE REDUCTION: This exam was performed according to the departmental dose-optimization program which includes automated exposure control, adjustment of the mA and/or kV according to patient size and/or use of iterative reconstruction technique. CONTRAST:  49m OMNIPAQUE IOHEXOL 300 MG/ML  SOLN COMPARISON:  Abdominopelvic CT 02/22/2022.  Chest CTA 05/19/2021. FINDINGS: Cardiovascular: Multi-vessel  coronary artery atherosclerosis with lesser involvement of the aorta and great vessels. No acute vascular findings are identified. The heart size is normal. There is no pericardial effusion. Mediastinum/Nodes: S

## 2022-02-26 ENCOUNTER — Telehealth: Payer: Self-pay

## 2022-02-26 NOTE — Telephone Encounter (Signed)
Call placed to Mr. Mole to make him aware of MRI and follow up with Dr. Grayland Ormond. No answer. Left voicemail to return call. Will also send mychart message. ?

## 2022-02-27 ENCOUNTER — Telehealth: Payer: Self-pay

## 2022-02-27 NOTE — Telephone Encounter (Signed)
Called and spoke with spouse, Cassie. Made aware of MRI that is scheduled for 4/10 at 1100. Reviewed instructions per MRI. With his colostomy, per MRI, he will not have to do fleets enema. Npo 4 hours prior and no ejaculation 48 hours prior. Dr. Grayland Ormond will see him on 4/11 at 0900 for results. ?

## 2022-03-03 ENCOUNTER — Ambulatory Visit
Admission: RE | Admit: 2022-03-03 | Discharge: 2022-03-03 | Disposition: A | Discharge status: Home / Self Care | Payer: Medicare Other | Source: Ambulatory Visit | Attending: Oncology | Admitting: Oncology

## 2022-03-03 DIAGNOSIS — C20 Malignant neoplasm of rectum: Secondary | ICD-10-CM | POA: Diagnosis present

## 2022-03-03 NOTE — Progress Notes (Deleted)
?[place]  ?[phone]) [phone] [phone] ? ?ID: [name] OB: 10/21/1956  MR#: [id number]  [id number]#:[id number] ? ?Patient Care Team: ?[name], MD as PCP - General (Cardiology) ?Clent Jacks, RN as Oncology Nurse Navigator ? ?CHIEF COMPLAINT: Adenocarcinoma of the rectum. ? ?INTERVAL HISTORY:  Patient is a 66 year old male who recently presented to the emergency room with increasing abdominal pain.  Subsequent work-up included CT scan revealed a nearly 9 cm partially obstructing rectal mass.  He subsequently underwent diverting colostomy.  Biopsy confirmed malignancy.  Patient feels improved and is being discharged from hospital today.  He has no neurologic complaints.  He denies any recent fevers or illnesses.  He has a fair appetite, but denies weight loss.  He has no chest pain, shortness of breath, cough, or hemoptysis.  He denies any further abdominal pain.  He has no nausea, vomiting, constipation, or diarrhea.  He did not report any melena or hematochezia.  He has no urinary complaints.  Patient offers no further specific complaints. ? ?REVIEW OF SYSTEMS:   ?ROS ? ?As per HPI. Otherwise, a complete review of systems is negative. ? ?PAST MEDICAL HISTORY: ?Past Medical History:  ?Diagnosis Date  ? AC joint dislocation   ? CAD (coronary artery disease)   ? CVA (cerebral vascular accident) [facility])   ? Metatarsal bone fracture   ? 3rd metatarsal , left foot; April 2021  ? Syncope   ? June '22  ? Tobacco abuse   ? ? ?PAST SURGICAL HISTORY: ?Past Surgical History:  ?Procedure Laterality Date  ? ACROMIO-CLAVICULAR JOINT REPAIR Left 09/11/2016  ? Procedure: ACROMIO-CLAVICULAR RECONSTRUCTION;  Surgeon: Corky Mull, MD;  Location: [facility] ORS;  Service: Orthopedics;  Laterality: Left;  clavicle  ? FLEXIBLE SIGMOIDOSCOPY N/A 02/24/2022  ? Procedure: FLEXIBLE SIGMOIDOSCOPY;  Surgeon: Lesly Rubenstein, MD;  Location: [facility] END[facility];  Service: Endoscopy;  Laterality: N/A;  ?  LEFT HEART CATH AND CORONARY ANGIOGRAPHY N/A 05/20/2021  ? Procedure: LEFT HEART CATH AND CORONARY ANGIOGRAPHY with coronary intervention;  Surgeon: [name], MD;  Location: [place] [facility];  Service: Cardiovascular;  Laterality: N/A;  ? MANDIBLE RECONSTRUCTION    ? TONSILLECTOMY    ? AGE 60  ? XI ROBOTIC ASSISTED INGUINAL HERNIA REPAIR WITH MESH Right 03/26/2020  ? Procedure: XI ROBOTIC ASSISTED INGUINAL HERNIA REPAIR WITH MESH;  Surgeon: Herbert Pun, MD;  Location: [facility] ORS;  Service: General;  Laterality: Right;  ? ? ?FAMILY HISTORY: ?Family History  ?Problem Relation Age of Onset  ? Diabetes Mother   ? Prostate cancer Father   ? ? ?ADVANCED DIRECTIVES (Y/N):  N ? ?HEALTH MAINTENANCE: ?Social History  ? ?Tobacco Use  ? Smoking status: Some Days  ?  Packs/day: 0.25  ?  Years: 15.00  ?  Pack years: 3.75  ?  Types: Cigarettes  ?  Passive exposure: Current  ? Smokeless tobacco: Never  ?Vaping Use  ? Vaping Use: Never used  ?Substance Use Topics  ? Alcohol use: Yes  ?  Alcohol/week: 42.0 standard drinks  ?  Types: 42 Cans of beer per week  ?  Comment: 3 every other day  ? Drug use: No  ? ? ? Colonoscopy: ? PAP: ? Bone density: ? Lipid panel: ? ?No Known Allergies ? ?Current Outpatient Medications  ?Medication Sig Dispense Refill  ? acetaminophen (TYLENOL) 325 MG tablet Take 2 tablets (650 mg total) by mouth every 6 (six) hours as needed for mild pain.    ?  atorvastatin (LIPITOR) 40 MG tablet Take 1 tablet (40 mg total) by mouth daily. 30 tablet 0  ? feeding supplement (ENSURE ENLIVE / ENSURE PLUS) LIQD Take 237 mLs by mouth 3 (three) times daily between meals. 21330 mL 0  ? ferrous sulfate 325 (65 FE) MG tablet Take 1 tablet (325 mg total) by mouth daily. 30 tablet 0  ? folic acid (FOLVITE) 1 MG tablet Take 1 tablet (1 mg total) by mouth daily. 30 tablet 0  ? Multiple Vitamins-Minerals (MULTIVITAMIN WITH MINERALS) tablet Take 1 tablet by mouth daily.    ? nicotine (NICODERM CQ - DOSED IN MG/24 HOURS)  21 mg/24hr patch Place 21 mg onto the skin daily.    ? oxyCODONE (OXY IR/ROXICODONE) 5 MG immediate release tablet Take 1 tablet (5 mg total) by mouth every 6 (six) hours as needed for severe pain. 10 tablet 0  ? tamsulosin (FLOMAX) 0.4 MG CAPS capsule Take 1 capsule (0.4 mg total) by mouth daily after supper. 30 capsule 0  ? thiamine 100 MG tablet Take 1 tablet (100 mg total) by mouth daily. 30 tablet 0  ? ?No current facility-administered medications for this visit.  ? ? ?OBJECTIVE: ?There were no vitals filed for this visit.   There is no height or weight on file to calculate BMI.    ECOG FS:{CHL ONC [id number]} ? ?General: Well-developed, well-nourished, no acute distress. ?Eyes: Pink conjunctiva, anicteric sclera. ?HEENT: Normocephalic, moist mucous membranes. ?Lungs: No audible wheezing or coughing. ?Heart: Regular rate and rhythm. ?Abdomen: Soft, nontender, no obvious distention. ?Musculoskeletal: No edema, cyanosis, or clubbing. ?Neuro: Alert, answering all questions appropriately. Cranial nerves grossly intact. ?Skin: No rashes or petechiae noted. ?Psych: Normal affect. ?Lymphatics: No cervical, calvicular, axillary or inguinal LAD. ? ? ?LAB RESULTS: ? ?Lab Results  ?Component Value Date  ? NA 134 (L) 02/25/2022  ? K 3.4 (L) 02/25/2022  ? CL 102 02/25/2022  ? CO2 25 02/25/2022  ? GLUCOSE 90 02/25/2022  ? BUN 9 02/25/2022  ? CREATININE 0.53 (L) 02/25/2022  ? CALCIUM 7.8 (L) 02/25/2022  ? PROT 5.5 (L) 02/23/2022  ? ALBUMIN 2.2 (L) 02/23/2022  ? AST 15 02/23/2022  ? ALT 10 02/23/2022  ? ALKPHOS 52 02/23/2022  ? BILITOT 0.4 02/23/2022  ? GFRNONAA >60 02/25/2022  ? GFRAA >60 10/03/2016  ? ? ?Lab Results  ?Component Value Date  ? WBC 16.5 (H) 02/25/2022  ? NEUTROABS 8.4 (H) 02/05/2022  ? HGB 8.1 (L) 02/25/2022  ? HCT 25.5 (L) 02/25/2022  ? MCV 77.3 (L) 02/25/2022  ? PLT 607 (H) 02/25/2022  ? ? ? ?STUDIES: ?CT CHEST W CONTRAST ? ?Result Date: 02/25/2022 ?CLINICAL DATA:  Metastatic colon cancer, staging. *  Tracking Code: BO * EXAM: CT CHEST WITH CONTRAST TECHNIQUE: Multidetector CT imaging of the chest was performed during intravenous contrast administration. RADIATION DOSE REDUCTION: This exam was performed according to the departmental dose-optimization program which includes automated exposure control, adjustment of the mA and/or kV according to patient size and/or use of iterative reconstruction technique. CONTRAST:  72m OMNIPAQUE IOHEXOL 300 MG/ML  SOLN COMPARISON:  Abdominopelvic CT 02/22/2022.  Chest CTA 05/19/2021. FINDINGS: Cardiovascular: Multi-vessel coronary artery atherosclerosis with lesser involvement of the aorta and great vessels. No acute vascular findings are identified. The heart size is normal. There is no pericardial effusion. Mediastinum/Nodes: Small hilar lymph nodes bilaterally are similar to the previous CT, likely reactive. No enlarged mediastinal or axillary lymph nodes demonstrated. The thyroid gland, trachea and esophagus demonstrate no significant findings.  Lungs/Pleura: There are new small bilateral pleural effusions with associated bibasilar atelectasis compared with the abdominal CT performed 3 days ago. Underlying mild centrilobular emphysema with new diffuse central airway thickening and ill-defined peribronchial opacities which may reflect edema or early inflammation. No confluent airspace opacity or highly suspicious pulmonary nodularity. Upper abdomen: No acute or significant findings are seen within the visualized upper abdomen. Musculoskeletal/Chest wall: New soft tissue emphysema within the right lateral chest wall, of uncertain etiology. No focal fluid collections are identified. There are nonacute incompletely healed rib fractures on the left, unchanged from previous CT. No acute or suspicious osseous findings. Mild degenerative changes in the thoracic spine. IMPRESSION: 1. Compared with the abdominal CT of 3 days ago, there are new bilateral pleural effusions with  bibasilar atelectasis, central airway thickening and patchy pulmonary opacities, likely reflecting edema or atypical inflammation. Recommend chest radiographic follow-up. 2. Assessment for metastatic disease limited

## 2022-03-04 ENCOUNTER — Inpatient Hospital Stay: Payer: Medicare Other | Admitting: Oncology

## 2022-03-04 ENCOUNTER — Other Ambulatory Visit: Payer: Self-pay | Admitting: Oncology

## 2022-03-04 ENCOUNTER — Encounter: Payer: Self-pay | Admitting: Oncology

## 2022-03-04 ENCOUNTER — Inpatient Hospital Stay: Payer: Medicare Other | Attending: Oncology | Admitting: Oncology

## 2022-03-04 VITALS — BP 144/74 | HR 106 | Temp 98.6°F | Resp 20 | Wt 130.0 lb

## 2022-03-04 DIAGNOSIS — C20 Malignant neoplasm of rectum: Secondary | ICD-10-CM | POA: Diagnosis present

## 2022-03-04 DIAGNOSIS — Z8042 Family history of malignant neoplasm of prostate: Secondary | ICD-10-CM | POA: Diagnosis not present

## 2022-03-04 DIAGNOSIS — Z933 Colostomy status: Secondary | ICD-10-CM | POA: Insufficient documentation

## 2022-03-04 DIAGNOSIS — R109 Unspecified abdominal pain: Secondary | ICD-10-CM | POA: Diagnosis not present

## 2022-03-04 DIAGNOSIS — E871 Hypo-osmolality and hyponatremia: Secondary | ICD-10-CM | POA: Diagnosis not present

## 2022-03-04 DIAGNOSIS — D649 Anemia, unspecified: Secondary | ICD-10-CM | POA: Diagnosis not present

## 2022-03-04 DIAGNOSIS — F17210 Nicotine dependence, cigarettes, uncomplicated: Secondary | ICD-10-CM | POA: Insufficient documentation

## 2022-03-04 DIAGNOSIS — K0510 Chronic gingivitis, plaque induced: Secondary | ICD-10-CM | POA: Insufficient documentation

## 2022-03-04 DIAGNOSIS — K1379 Other lesions of oral mucosa: Secondary | ICD-10-CM | POA: Diagnosis not present

## 2022-03-04 DIAGNOSIS — Z5111 Encounter for antineoplastic chemotherapy: Secondary | ICD-10-CM | POA: Insufficient documentation

## 2022-03-04 DIAGNOSIS — D75839 Thrombocytosis, unspecified: Secondary | ICD-10-CM | POA: Insufficient documentation

## 2022-03-04 DIAGNOSIS — D72829 Elevated white blood cell count, unspecified: Secondary | ICD-10-CM | POA: Insufficient documentation

## 2022-03-04 MED ORDER — FENTANYL 25 MCG/HR TD PT72
1.0000 | MEDICATED_PATCH | TRANSDERMAL | 0 refills | Status: DC
Start: 2022-03-04 — End: 2022-04-22

## 2022-03-04 MED ORDER — PROCHLORPERAZINE MALEATE 10 MG PO TABS: 10.0000 mg | ORAL_TABLET | Freq: Four times a day (QID) | ORAL | 2 refills | Status: DC | PRN

## 2022-03-04 MED ORDER — LIDOCAINE-PRILOCAINE 2.5-2.5 % EX CREA: TOPICAL_CREAM | CUTANEOUS | 3 refills | Status: DC

## 2022-03-04 MED ORDER — ONDANSETRON HCL 8 MG PO TABS: 8.0000 mg | ORAL_TABLET | Freq: Two times a day (BID) | ORAL | 2 refills | Status: DC | PRN

## 2022-03-04 MED ORDER — OXYCODONE HCL 10 MG PO TABS: 10.0000 mg | ORAL_TABLET | ORAL | 0 refills | Status: DC | PRN

## 2022-03-04 NOTE — Progress Notes (Signed)
START ON PATHWAY REGIMEN - Colorectal ? ? ?  A cycle is every 14 days: ?    Oxaliplatin  ?    Leucovorin  ?    Fluorouracil  ?    Fluorouracil  ? ?**Always confirm dose/schedule in your pharmacy ordering system** ? ?Patient Characteristics: ?Preoperative or Nonsurgical Candidate (Clinical Staging), Rectal, cT3 - cT4, cN0 or Any cT, cN+ ?Tumor Location: Rectal ?Therapeutic Status: Preoperative or Nonsurgical Candidate (Clinical Staging) ?AJCC T Category: cT3 ?AJCC N Category: cN0 ?AJCC M Category: cM0 ?AJCC 8 Stage Grouping: IIA ?Intent of Therapy: ?Curative Intent, Discussed with Patient ?

## 2022-03-04 NOTE — Progress Notes (Signed)
?[place]  ?Telephone:(336) [phone] [phone] ? ?ID: Kyle Larson OB: 1956/05/19  MR#: [id number]  JYN#:[id number] ? ?Patient Care Team: ?[name], MD as PCP - General (Cardiology) ?Clent Jacks, RN as Oncology Nurse Navigator ? ?CHIEF COMPLAINT: Adenocarcinoma of the rectum. ? ?INTERVAL HISTORY: Patient returns to clinic today for hospital follow-up, discussion of his imaging results, and treatment planning.  He continues to be in a significant amount of pain, but otherwise feels well. He has no neurologic complaints.  He denies any recent fevers or illnesses.  He has a fair appetite, but denies weight loss.  He has no chest pain, shortness of breath, cough, or hemoptysis.  He has no nausea, vomiting, constipation, or diarrhea.  He did not report any melena or hematochezia.  He has no urinary complaints.  Patient offers no further specific complaints today. ? ?REVIEW OF SYSTEMS:   ?Review of Systems  ?Constitutional: Negative.  Negative for fever, malaise/fatigue and weight loss.  ?Respiratory: Negative.  Negative for cough, hemoptysis and shortness of breath.   ?Cardiovascular: Negative.  Negative for chest pain and leg swelling.  ?Gastrointestinal: Negative.  Negative for abdominal pain, blood in stool, constipation, diarrhea and melena.  ?Genitourinary: Negative.  Negative for dysuria.  ?Musculoskeletal:   ?     Pelvic pain  ?Skin: Negative.  Negative for rash.  ?Neurological: Negative.  Negative for dizziness, speech change, focal weakness and headaches.  ?Psychiatric/Behavioral: Negative.  The patient is not nervous/anxious.   ? ?As per HPI. Otherwise, a complete review of systems is negative. ? ?PAST MEDICAL HISTORY: ?Past Medical History:  ?Diagnosis Date  ? AC joint dislocation   ? CAD (coronary artery disease)   ? CVA (cerebral vascular accident) [facility])   ? Metatarsal bone fracture   ? 3rd metatarsal , left foot; April 2021  ? Syncope   ? June '22  ? Tobacco  abuse   ? ? ?PAST SURGICAL HISTORY: ?Past Surgical History:  ?Procedure Laterality Date  ? ACROMIO-CLAVICULAR JOINT REPAIR Left 09/11/2016  ? Procedure: ACROMIO-CLAVICULAR RECONSTRUCTION;  Surgeon: Corky Mull, MD;  Location: [facility] OR[facility];  Service: Orthopedics;  Laterality: Left;  clavicle  ? FLEXIBLE SIGMOIDOSCOPY N/A 02/24/2022  ? Procedure: FLEXIBLE SIGMOIDOSCOPY;  Surgeon: Lesly Rubenstein, MD;  Location: [facility];  Service: Endoscopy;  Laterality: N/A;  ? LEFT HEART CATH AND CORONARY ANGIOGRAPHY N/A 05/20/2021  ? Procedure: LEFT HEART CATH AND CORONARY ANGIOGRAPHY with coronary intervention;  Surgeon: [name], MD;  Location: [place] [facility];  Service: Cardiovascular;  Laterality: N/A;  ? MANDIBLE RECONSTRUCTION    ? TONSILLECTOMY    ? AGE 80  ? XI ROBOTIC ASSISTED INGUINAL HERNIA REPAIR WITH MESH Right 03/26/2020  ? Procedure: XI ROBOTIC ASSISTED INGUINAL HERNIA REPAIR WITH MESH;  Surgeon: Herbert Pun, MD;  Location: [facility];  Service: General;  Laterality: Right;  ? ? ?FAMILY HISTORY: ?Family History  ?Problem Relation Age of Onset  ? Diabetes Mother   ? Prostate cancer Father   ? ? ?ADVANCED DIRECTIVES (Y/N):  N ? ?HEALTH MAINTENANCE: ?Social History  ? ?Tobacco Use  ? Smoking status: Some Days  ?  Packs/day: 0.25  ?  Years: 15.00  ?  Pack years: 3.75  ?  Types: Cigarettes  ?  Passive exposure: Current  ? Smokeless tobacco: Never  ?Vaping Use  ? Vaping Use: Never used  ?Substance Use Topics  ? Alcohol use: Yes  ?  Alcohol/week: 42.0 standard drinks  ?  Types:  42 Cans of beer per week  ?  Comment: 3 every other day  ? Drug use: No  ? ? ? Colonoscopy: ? PAP: ? Bone density: ? Lipid panel: ? ?No Known Allergies ? ?Current Outpatient Medications  ?Medication Sig Dispense Refill  ? acetaminophen (TYLENOL) 325 MG tablet Take 2 tablets (650 mg total) by mouth every 6 (six) hours as needed for mild pain.    ? atorvastatin (LIPITOR) 40 MG tablet Take 1 tablet (40 mg total) by mouth daily. 30  tablet 0  ? feeding supplement (ENSURE ENLIVE / ENSURE PLUS) LIQD Take 237 mLs by mouth 3 (three) times daily between meals. 21330 mL 0  ? ferrous sulfate 325 (65 FE) MG tablet Take 1 tablet (325 mg total) by mouth daily. 30 tablet 0  ? folic acid (FOLVITE) 1 MG tablet Take 1 tablet (1 mg total) by mouth daily. 30 tablet 0  ? Multiple Vitamins-Minerals (MULTIVITAMIN WITH MINERALS) tablet Take 1 tablet by mouth daily.    ? nicotine (NICODERM CQ - DOSED IN MG/24 HOURS) 21 mg/24hr patch Place 21 mg onto the skin daily.    ? oxyCODONE (OXY IR/ROXICODONE) 5 MG immediate release tablet Take 1 tablet (5 mg total) by mouth every 6 (six) hours as needed for severe pain. 10 tablet 0  ? tamsulosin (FLOMAX) 0.4 MG CAPS capsule Take 1 capsule (0.4 mg total) by mouth daily after supper. 30 capsule 0  ? thiamine 100 MG tablet Take 1 tablet (100 mg total) by mouth daily. 30 tablet 0  ? ?No current facility-administered medications for this visit.  ? ? ?OBJECTIVE: ?Vitals:  ? 03/04/22 1329  ?BP: (!) 144/74  ?Pulse: (!) 106  ?Resp: 20  ?Temp: 98.6 ?F (37 ?C)  ?SpO2: 97%  ?   Body mass index is 17.63 kg/m?Marland Kitchen    ECOG FS:1 - Symptomatic but completely ambulatory ? ?General: Well-developed, well-nourished, no acute distress. ?Eyes: Pink conjunctiva, anicteric sclera. ?HEENT: Normocephalic, moist mucous membranes. ?Lungs: No audible wheezing or coughing. ?Heart: Regular rate and rhythm. ?Abdomen: Soft, nontender, no obvious distention. ?Musculoskeletal: No edema, cyanosis, or clubbing. ?Neuro: Alert, answering all questions appropriately. Cranial nerves grossly intact. ?Skin: No rashes or petechiae noted. ?Psych: Normal affect. ?Lymphatics: No cervical, calvicular, axillary or inguinal LAD. ? ? ?LAB RESULTS: ? ?Lab Results  ?Component Value Date  ? NA 134 (L) 02/25/2022  ? K 3.4 (L) 02/25/2022  ? CL 102 02/25/2022  ? CO2 25 02/25/2022  ? GLUCOSE 90 02/25/2022  ? BUN 9 02/25/2022  ? CREATININE 0.53 (L) 02/25/2022  ? CALCIUM 7.8 (L)  02/25/2022  ? PROT 5.5 (L) 02/23/2022  ? ALBUMIN 2.2 (L) 02/23/2022  ? AST 15 02/23/2022  ? ALT 10 02/23/2022  ? ALKPHOS 52 02/23/2022  ? BILITOT 0.4 02/23/2022  ? GFRNONAA >60 02/25/2022  ? GFRAA >60 10/03/2016  ? ? ?Lab Results  ?Component Value Date  ? WBC 16.5 (H) 02/25/2022  ? NEUTROABS 8.4 (H) 02/05/2022  ? HGB 8.1 (L) 02/25/2022  ? HCT 25.5 (L) 02/25/2022  ? MCV 77.3 (L) 02/25/2022  ? PLT 607 (H) 02/25/2022  ? ? ? ?STUDIES: ?CT CHEST W CONTRAST ? ?Result Date: 02/25/2022 ?CLINICAL DATA:  Metastatic colon cancer, staging. * Tracking Code: BO * EXAM: CT CHEST WITH CONTRAST TECHNIQUE: Multidetector CT imaging of the chest was performed during intravenous contrast administration. RADIATION DOSE REDUCTION: This exam was performed according to the departmental dose-optimization program which includes automated exposure control, adjustment of the mA and/or kV according  to patient size and/or use of iterative reconstruction technique. CONTRAST:  68m OMNIPAQUE IOHEXOL 300 MG/ML  SOLN COMPARISON:  Abdominopelvic CT 02/22/2022.  Chest CTA 05/19/2021. FINDINGS: Cardiovascular: Multi-vessel coronary artery atherosclerosis with lesser involvement of the aorta and great vessels. No acute vascular findings are identified. The heart size is normal. There is no pericardial effusion. Mediastinum/Nodes: Small hilar lymph nodes bilaterally are similar to the previous CT, likely reactive. No enlarged mediastinal or axillary lymph nodes demonstrated. The thyroid gland, trachea and esophagus demonstrate no significant findings. Lungs/Pleura: There are new small bilateral pleural effusions with associated bibasilar atelectasis compared with the abdominal CT performed 3 days ago. Underlying mild centrilobular emphysema with new diffuse central airway thickening and ill-defined peribronchial opacities which may reflect edema or early inflammation. No confluent airspace opacity or highly suspicious pulmonary nodularity. Upper abdomen: No  acute or significant findings are seen within the visualized upper abdomen. Musculoskeletal/Chest wall: New soft tissue emphysema within the right lateral chest wall, of uncertain etiology. No focal fluid collec

## 2022-03-04 NOTE — Progress Notes (Signed)
Patient here for hospital follow up and results he reports being in constant pain in is abdomen. He has lost 10lbs and is not eating because food causes more pain. ?

## 2022-03-05 ENCOUNTER — Encounter: Payer: Self-pay | Admitting: Oncology

## 2022-03-05 ENCOUNTER — Telehealth: Payer: Self-pay | Admitting: Emergency Medicine

## 2022-03-05 NOTE — Telephone Encounter (Signed)
Pt informed of Port placement appointment on 4/17 at 10am with 9am arrival time.  ?

## 2022-03-05 NOTE — Progress Notes (Signed)
Pharmacist Chemotherapy Monitoring - Initial Assessment   ? ?Anticipated start date: 03/12/22  ? ?The following has been reviewed per standard work regarding the patient's treatment regimen: ?The patient's diagnosis, treatment plan and drug doses, and organ/hematologic function ?Lab orders and baseline tests specific to treatment regimen  ?The treatment plan start date, drug sequencing, and pre-medications ?Prior authorization status  ?Patient's documented medication list, including drug-drug interaction screen and prescriptions for anti-emetics and supportive care specific to the treatment regimen ?The drug concentrations, fluid compatibility, administration routes, and timing of the medications to be used ?The patient's access for treatment and lifetime cumulative dose history, if applicable  ?The patient's medication allergies and previous infusion related reactions, if applicable  ? ?Changes made to treatment plan:  ?N/A ? ?Follow up needed:  ?N/A ? ? ?Adelina Mings, [facility], ?03/05/2022  12:19 PM  ?

## 2022-03-06 ENCOUNTER — Encounter: Payer: Self-pay | Admitting: Urology

## 2022-03-06 ENCOUNTER — Other Ambulatory Visit: Payer: Medicare Other

## 2022-03-06 ENCOUNTER — Ambulatory Visit (INDEPENDENT_AMBULATORY_CARE_PROVIDER_SITE_OTHER): Payer: Medicare Other | Admitting: Urology

## 2022-03-06 VITALS — BP 96/63 | HR 117 | Ht 72.0 in | Wt 130.0 lb

## 2022-03-06 DIAGNOSIS — R339 Retention of urine, unspecified: Secondary | ICD-10-CM

## 2022-03-06 DIAGNOSIS — C20 Malignant neoplasm of rectum: Secondary | ICD-10-CM

## 2022-03-06 NOTE — Progress Notes (Signed)
Tumor Board Documentation ? ?[name] was presented by Dr Grayland Ormond at our Tumor Board on 03/06/2022, which included representatives from medical oncology, radiology, pathology, surgical, pharmacy, pulmonology, genetics, radiation oncology, navigation, research, palliative care, internal medicine. ? ?Elbert currently presents as a new patient, for [place], for new positive pathology with history of the following treatments: surgical intervention(s). ? ?Additionally, we reviewed previous medical and familial history, history of present illness, and recent lab results along with all available histopathologic and imaging studies. The tumor board considered available treatment options and made the following recommendations: ?Neoadjuvant chemotherapy (followed by Radiation Therapy then Surgery) ?  ? ?The following procedures/referrals were also placed: No orders of the defined types were placed in this encounter. ? ? ?Clinical Trial Status: not discussed  ? ?Staging used: To be determined ?AJCC Staging: ?T: 3-4 ?N: 1 ?M: 0 ?Group: Adenocarcinoma of Rectum  ? ?National site-specific guidelines NCCN were discussed with respect to the case. ? ?Tumor board is a meeting of clinicians from various specialty areas who evaluate and discuss patients for whom a multidisciplinary approach is being considered. Final determinations in the plan of care are those of the provider(s). The responsibility for follow up of recommendations given during tumor board is that of the provider.  ? ?Today?s extended care, comprehensive team conference, [place] was not present for the discussion and was not examined.  ? ?Multidisciplinary Tumor Board is a multidisciplinary case peer review process.  Decisions discussed in the Multidisciplinary Tumor Board reflect the opinions of the specialists present at the conference without having examined the patient.  Ultimately, treatment and diagnostic decisions rest with the primary provider(s) and the  patient. ? ?

## 2022-03-06 NOTE — Progress Notes (Signed)
? ?  03/06/2022 ?6:10 PM  ? ?Kyle Larson ?[birth year] ?[id number] ? ?Reason for visit: Urinary retention, rectal mass ? ?HPI: ?66 year old male who I was working up for urinary retention and possible BPH.  He had numerous ER visits in the fall 2022, and a catheter was placed at that time for a PVR of 500 mL.  He ultimately cut the catheter out at home, and developed epididymitis and was treated with antibiotics.  He was also hospitalized in February 2023 for stroke and started on anticoagulation. ? ?I last saw him on 02/05/2022 when he was an urgent add-on in clinic for possible urinary retention, but bladder scan only showed 134 mL, but he had significant difficulty voiding and discomfort and requested a catheter be placed.  At that time he was also having dark and tarry stools, was visibly short of breath and tachycardic, and I had sent him to the ER for further evaluation of possible GI bleed.  He did not present to the ER at that time. ? ?He had worsening lower abdominal pain and presented to the ER on 02/22/2022 with worsening lower abdominal pain and work-up ultimately revealed a large necrotic rectal mass measuring at least 9 cm with partial bowel obstruction and highly suspicious for rectal carcinoma.  He underwent a loop sigmoid colostomy with Dr. Adora Fridge, and biopsy showed invasive moderately differentiated adenocarcinoma.  He also had some very mild hydronephrosis at that time, but renal function was normal and no further intervention was performed aside from maintaining his Foley catheter. ? ?He has been evaluated by oncology as well as tumor board, and plan moving forward is for neoadjuvant chemotherapy, radiation, and ultimately surgery with the colorectal team at [facility]. ? ?I personally viewed and interpreted the recent CT scan as well as the MRI that shows a large rectal mass that is likely causing compression anteriorly resulting in his urinary retention and recurrent urinary symptoms.  Prostate measures  only 30 g.  PSA was mildly elevated at 5.5, which is not surprising in the setting of his numerous catheters and large rectal mass.  I think his large rectal cancer takes precedence over further evaluation of his mildly elevated PSA at this time. ? ?We discussed the need to continue Foley catheter changes on a monthly basis until either shrinkage of the rectal cancer with chemotherapy or through surgery.  We discussed other options like a voiding trial, but he would like to continue the catheter as this is much more comfortable for him than trying to void.  I think this is very reasonable. ? ?Follow-up next week for Foley catheter change, continue monthly changes ?RTC with me 3 months to reassess imaging and response to chemotherapy/radiation, and consider voiding trial ? ?Billey Co, MD ? ?[place] ?[address] ?[place], [place] [address] ?[phone] ? ?

## 2022-03-06 NOTE — Progress Notes (Signed)
Spoke with Patient's wife Kyle Larson) 03/06/22 @ 12:55 pm. Martin Majestic over pre-procedure instructions to include need to arrive at 9:00 for 10:00 procedure, need to be NPO after midnight on night prior to procedure and need for driver post procedure. Patient's wife confirmed patient is not on any blood thinners or diabetes medication. Wife verbalized understanding of these instructions. ?

## 2022-03-07 ENCOUNTER — Other Ambulatory Visit: Payer: Self-pay | Admitting: Physician Assistant

## 2022-03-07 ENCOUNTER — Inpatient Hospital Stay: Payer: Medicare Other

## 2022-03-10 ENCOUNTER — Ambulatory Visit
Admission: RE | Admit: 2022-03-10 | Discharge: 2022-03-10 | Disposition: A | Discharge status: Home / Self Care | Payer: Medicare Other | Source: Ambulatory Visit | Attending: Oncology | Admitting: Oncology

## 2022-03-10 DIAGNOSIS — F17210 Nicotine dependence, cigarettes, uncomplicated: Secondary | ICD-10-CM | POA: Insufficient documentation

## 2022-03-10 DIAGNOSIS — I2510 Atherosclerotic heart disease of native coronary artery without angina pectoris: Secondary | ICD-10-CM | POA: Diagnosis not present

## 2022-03-10 DIAGNOSIS — C20 Malignant neoplasm of rectum: Secondary | ICD-10-CM | POA: Diagnosis present

## 2022-03-10 DIAGNOSIS — Z8673 Personal history of transient ischemic attack (TIA), and cerebral infarction without residual deficits: Secondary | ICD-10-CM | POA: Diagnosis not present

## 2022-03-10 HISTORY — PX: IR IMAGING GUIDED PORT INSERTION: IMG5740

## 2022-03-10 MED ORDER — FENTANYL CITRATE (PF) 100 MCG/2ML IJ SOLN
INTRAMUSCULAR | Status: AC | PRN
  Administered 2022-03-10: 50 ug via INTRAVENOUS
  Administered 2022-03-10: 25 ug via INTRAVENOUS

## 2022-03-10 MED ORDER — FENTANYL CITRATE (PF) 100 MCG/2ML IJ SOLN
INTRAMUSCULAR | Status: AC
  Filled 2022-03-10: qty 2

## 2022-03-10 MED ORDER — HEPARIN SOD (PORK) LOCK FLUSH 100 UNIT/ML IV SOLN
INTRAVENOUS | Status: AC
  Administered 2022-03-10: 500 [IU]
  Filled 2022-03-10: qty 5

## 2022-03-10 MED ORDER — SODIUM CHLORIDE 0.9 % IV SOLN
INTRAVENOUS | Status: DC
  Filled 2022-03-10: qty 1000

## 2022-03-10 MED ORDER — LIDOCAINE-EPINEPHRINE 1 %-1:100000 IJ SOLN
INTRAMUSCULAR | Status: AC
  Administered 2022-03-10: 13 mL
  Filled 2022-03-10: qty 1

## 2022-03-10 NOTE — H&P (Signed)
? ?Chief Complaint: ?Metastatic adenocarcinoma of the rectum. Request is for portacath placement for chemotherapy access ? ?Referring Physician(s): ?Finnegan,Timothy J ? ?Supervising Physician: Juliet Rude ? ?Patient Status: [place] ? ?History of Present Illness: ?Kyle Larson is a 66 y.o. male outpatient. Smoker. History of CVA, CAD. Metastatic adenocarcinoma of the rectum. Team is requesting porta cath placement for chemotherapy access. ? ?Patient alert and sitting up in chair. Endorses back pain that he attributes to the car ride over to the hospital this morning. Denies any fevers, headache, chest pain, SOB, cough, abdominal pain, nausea, vomiting or bleeding. Return precautions and treatment recommendations and follow-up discussed with the patient  and his wife at bedside who are both agreeable with the plan. ? ? ? ?Past Medical History:  ?Diagnosis Date  ? AC joint dislocation   ? CAD (coronary artery disease)   ? CVA (cerebral vascular accident) [facility])   ? Metatarsal bone fracture   ? 3rd metatarsal , left foot; April 2021  ? Syncope   ? June '22  ? Tobacco abuse   ? ? ?Past Surgical History:  ?Procedure Laterality Date  ? ACROMIO-CLAVICULAR JOINT REPAIR Left 09/11/2016  ? Procedure: ACROMIO-CLAVICULAR RECONSTRUCTION;  Surgeon: Corky Mull, MD;  Location: [facility] ORS;  Service: Orthopedics;  Laterality: Left;  clavicle  ? FLEXIBLE SIGMOIDOSCOPY N/A 02/24/2022  ? Procedure: FLEXIBLE SIGMOIDOSCOPY;  Surgeon: Lesly Rubenstein, MD;  Location: [facility] ENDOSCOPY;  Service: Endoscopy;  Laterality: N/A;  ? LEFT HEART CATH AND CORONARY ANGIOGRAPHY N/A 05/20/2021  ? Procedure: LEFT HEART CATH AND CORONARY ANGIOGRAPHY with coronary intervention;  Surgeon: [name], MD;  Location: [place] [facility];  Service: Cardiovascular;  Laterality: N/A;  ? MANDIBLE RECONSTRUCTION    ? TONSILLECTOMY    ? AGE 57  ? XI ROBOTIC ASSISTED INGUINAL HERNIA REPAIR WITH MESH Right 03/26/2020  ? Procedure: XI ROBOTIC  ASSISTED INGUINAL HERNIA REPAIR WITH MESH;  Surgeon: Herbert Pun, MD;  Location: [facility] ORS;  Service: General;  Laterality: Right;  ? ? ?Allergies: ?Patient has no known allergies. ? ?Medications: ?Prior to Admission medications   ?Medication Sig Start Date End Date Taking? Authorizing Provider  ?acetaminophen (TYLENOL) 325 MG tablet Take 2 tablets (650 mg total) by mouth every 6 (six) hours as needed for mild pain. 02/25/22   Loletha Grayer, MD  ?atorvastatin (LIPITOR) 40 MG tablet Take 1 tablet (40 mg total) by mouth daily. 02/25/22   Loletha Grayer, MD  ?feeding supplement (ENSURE ENLIVE / ENSURE PLUS) LIQD Take 237 mLs by mouth 3 (three) times daily between meals. 02/25/22   Loletha Grayer, MD  ?fentaNYL (DURAGESIC) 25 MCG/HR Place 1 patch onto the skin every 3 (three) days. 03/04/22   Lloyd Huger, MD  ?ferrous sulfate 325 (65 FE) MG tablet Take 1 tablet (325 mg total) by mouth daily. 02/25/22 03/27/22  Loletha Grayer, MD  ?folic acid (FOLVITE) 1 MG tablet Take 1 tablet (1 mg total) by mouth daily. 02/25/22   Loletha Grayer, MD  ?lidocaine-prilocaine (EMLA) cream Apply to affected area once 03/04/22   Lloyd Huger, MD  ?Multiple Vitamins-Minerals (MULTIVITAMIN WITH MINERALS) tablet Take 1 tablet by mouth daily.    [provider]  ?nicotine (NICODERM CQ - DOSED IN MG/24 HOURS) 21 mg/24hr patch Place 21 mg onto the skin daily. 02/14/22   [provider]  ?ondansetron (ZOFRAN) 8 MG tablet TAKE 1 TABLET 2 TIMES DAILY AS NEEDED FOR REFRACTORY NAUSEA / VOMITING. START DAY 3 AFTER CHEMO 03/05/22  Lloyd Huger, MD  ?Oxycodone HCl 10 MG TABS Take 1 tablet (10 mg total) by mouth every 4 (four) hours as needed. 03/04/22   Lloyd Huger, MD  ?prochlorperazine (COMPAZINE) 10 MG tablet Take 1 tablet (10 mg total) by mouth every 6 (six) hours as needed (Nausea or vomiting). 03/04/22   Lloyd Huger, MD  ?tamsulosin (FLOMAX) 0.4 MG CAPS capsule Take 1 capsule (0.4 mg total)  by mouth daily after supper. 02/25/22   Loletha Grayer, MD  ?thiamine 100 MG tablet Take 1 tablet (100 mg total) by mouth daily. 02/25/22   Loletha Grayer, MD  ?  ? ?Family History  ?Problem Relation Age of Onset  ? Diabetes Mother   ? Prostate cancer Father   ? ? ?Social History  ? ?Socioeconomic History  ? Marital status: Married  ?  Spouse name: Not on file  ? Number of children: Not on file  ? Years of education: Not on file  ? Highest education level: Not on file  ?Occupational History  ? Not on file  ?Tobacco Use  ? Smoking status: Some Days  ?  Packs/day: 0.25  ?  Years: 15.00  ?  Pack years: 3.75  ?  Types: Cigarettes  ?  Passive exposure: Current  ? Smokeless tobacco: Never  ?Vaping Use  ? Vaping Use: Never used  ?Substance and Sexual Activity  ? Alcohol use: Yes  ?  Alcohol/week: 42.0 standard drinks  ?  Types: 42 Cans of beer per week  ?  Comment: 3 every other day  ? Drug use: No  ? Sexual activity: Not on file  ?Other Topics Concern  ? Not on file  ?Social History Narrative  ? Not on file  ? ?Social Determinants of Health  ? ?Financial Resource Strain: Not on file  ?Food Insecurity: Not on file  ?Transportation Needs: Not on file  ?Physical Activity: Not on file  ?Stress: Not on file  ?Social Connections: Not on file  ? ? ?Review of Systems: A 12 point ROS discussed and pertinent positives are indicated in the HPI above.  All other systems are negative. ? ?Review of Systems  ?Constitutional:  Negative for fever.  ?HENT:  Negative for congestion.   ?Respiratory:  Negative for cough and shortness of breath.   ?Cardiovascular:  Negative for chest pain.  ?Gastrointestinal:  Negative for abdominal pain.  ?Musculoskeletal:  Positive for back pain.  ?Neurological:  Negative for headaches.  ?Psychiatric/Behavioral:  Negative for behavioral problems and confusion.   ? ?Vital Signs: ?There were no vitals taken for this visit. ? ?Physical Exam ?Vitals and nursing note reviewed.  ?Constitutional:   ?   Appearance:  He is well-developed. He is ill-appearing.  ?HENT:  ?   Head: Normocephalic.  ?Cardiovascular:  ?   Rate and Rhythm: Normal rate and regular rhythm.  ?   Heart sounds: Normal heart sounds.  ?Pulmonary:  ?   Effort: Pulmonary effort is normal.  ?   Comments: On O2 via [place]. Diminished right sided. ?Musculoskeletal:     ?   General: Normal range of motion.  ?   Cervical back: Normal range of motion.  ?Skin: ?   General: Skin is dry.  ?Neurological:  ?   Mental Status: He is alert and oriented to person, place, and time.  ? ? ?Imaging: ?CT CHEST W CONTRAST ? ?Result Date: 02/25/2022 ?CLINICAL DATA:  Metastatic colon cancer, staging. * Tracking Code: BO * EXAM: CT CHEST WITH CONTRAST  TECHNIQUE: Multidetector CT imaging of the chest was performed during intravenous contrast administration. RADIATION DOSE REDUCTION: This exam was performed according to the departmental dose-optimization program which includes automated exposure control, adjustment of the mA and/or kV according to patient size and/or use of iterative reconstruction technique. CONTRAST:  54m OMNIPAQUE IOHEXOL 300 MG/ML  SOLN COMPARISON:  Abdominopelvic CT 02/22/2022.  Chest CTA 05/19/2021. FINDINGS: Cardiovascular: Multi-vessel coronary artery atherosclerosis with lesser involvement of the aorta and great vessels. No acute vascular findings are identified. The heart size is normal. There is no pericardial effusion. Mediastinum/Nodes: Small hilar lymph nodes bilaterally are similar to the previous CT, likely reactive. No enlarged mediastinal or axillary lymph nodes demonstrated. The thyroid gland, trachea and esophagus demonstrate no significant findings. Lungs/Pleura: There are new small bilateral pleural effusions with associated bibasilar atelectasis compared with the abdominal CT performed 3 days ago. Underlying mild centrilobular emphysema with new diffuse central airway thickening and ill-defined peribronchial opacities which may reflect edema or early  inflammation. No confluent airspace opacity or highly suspicious pulmonary nodularity. Upper abdomen: No acute or significant findings are seen within the visualized upper abdomen. Musculoskeletal/Chest wall

## 2022-03-10 NOTE — Progress Notes (Signed)
Patient clinically stable post  Port placement per Dr Denna Haggard, tolerated well with local as well as Fentanyl 75 mcg IV since drank coffee this am, awake/alert and oriented post procedure. Wife to bedside with discharge instructions given, questions answered. Report given to [facility] RN post procedure/specials. ?

## 2022-03-10 NOTE — Procedures (Signed)
Interventional Radiology Procedure Note ? ?Date of Procedure: 03/10/2022  ?Procedure: Port placement  ? ?Findings:  ?1. Port placement, right IJ  ? ?Complications: No immediate complications noted.  ? ?Estimated Blood Loss: minimal ? ?Follow-up and Recommendations: ?1. Ready for use  ? ? ?Albin Felling, MD  ?Vascular & Interventional Radiology  ?03/10/2022 11:16 AM ? ? ? ?

## 2022-03-10 NOTE — Progress Notes (Signed)
?[name]  ?[phone] [phone] ? ?ID: Kyle Larson OB: Apr 28, 1956  MR#: [id number]  QHU#:[id number] ? ?Patient Care Team: ?[name], MD as PCP - General (Cardiology) ?Clent Jacks, RN as Oncology Nurse Navigator ? ?CHIEF COMPLAINT: Stage IIa adenocarcinoma of the rectum. ? ?INTERVAL HISTORY: Patient returns to clinic today for further evaluation and consideration of cycle 1 of 8 of neoadjuvant FOLFOX.  His pain is better controlled, but still has symptoms of reflux and abdominal cramping particularly with eating.  He continues to have weakness and fatigue.  He has no neurologic complaints.  He denies any recent fevers or illnesses.  He has a fair appetite, but denies weight loss.  He has no chest pain, shortness of breath, cough, or hemoptysis.  He has no nausea, vomiting, constipation, or diarrhea.  He does not report any melena or hematochezia.  He has no urinary complaints.  Patient offers no further specific complaints today. ? ?REVIEW OF SYSTEMS:   ?Review of Systems  ?Constitutional:  Positive for malaise/fatigue. Negative for fever and weight loss.  ?Respiratory: Negative.  Negative for cough, hemoptysis and shortness of breath.   ?Cardiovascular: Negative.  Negative for chest pain and leg swelling.  ?Gastrointestinal: Negative.  Negative for abdominal pain, blood in stool, constipation, diarrhea and melena.  ?Genitourinary: Negative.  Negative for dysuria.  ?Musculoskeletal:   ?     Pelvic pain  ?Skin: Negative.  Negative for rash.  ?Neurological:  Positive for weakness. Negative for dizziness, speech change, focal weakness and headaches.  ?Psychiatric/Behavioral: Negative.  The patient is not nervous/anxious.   ? ?As per HPI. Otherwise, a complete review of systems is negative. ? ?PAST MEDICAL HISTORY: ?Past Medical History:  ?Diagnosis Date  ? AC joint dislocation   ? CAD (coronary artery disease)   ? CVA (cerebral vascular accident) [facility])   ?  Metatarsal bone fracture   ? 3rd metatarsal , left foot; April 2021  ? Syncope   ? June '22  ? Tobacco abuse   ? ? ?PAST SURGICAL HISTORY: ?Past Surgical History:  ?Procedure Laterality Date  ? ACROMIO-CLAVICULAR JOINT REPAIR Left 09/11/2016  ? Procedure: ACROMIO-CLAVICULAR RECONSTRUCTION;  Surgeon: Corky Mull, MD;  Location: [facility] ORS;  Service: Orthopedics;  Laterality: Left;  clavicle  ? FLEXIBLE SIGMOIDOSCOPY N/A 02/24/2022  ? Procedure: FLEXIBLE SIGMOIDOSCOPY;  Surgeon: Lesly Rubenstein, MD;  Location: [facility] ENDOSCOPY;  Service: Endoscopy;  Laterality: N/A;  ? IR IMAGING GUIDED PORT INSERTION  03/10/2022  ? LEFT HEART CATH AND CORONARY ANGIOGRAPHY N/A 05/20/2021  ? Procedure: LEFT HEART CATH AND CORONARY ANGIOGRAPHY with coronary intervention;  Surgeon: [name], MD;  Location: [place] [facility];  Service: Cardiovascular;  Laterality: N/A;  ? MANDIBLE RECONSTRUCTION    ? TONSILLECTOMY    ? AGE 66  ? XI ROBOTIC ASSISTED INGUINAL HERNIA REPAIR WITH MESH Right 03/26/2020  ? Procedure: XI ROBOTIC ASSISTED INGUINAL HERNIA REPAIR WITH MESH;  Surgeon: Herbert Pun, MD;  Location: [facility] ORS;  Service: General;  Laterality: Right;  ? ? ?FAMILY HISTORY: ?Family History  ?Problem Relation Age of Onset  ? Diabetes Mother   ? Prostate cancer Father   ? ? ?ADVANCED DIRECTIVES (Y/N):  N ? ?HEALTH MAINTENANCE: ?Social History  ? ?Tobacco Use  ? Smoking status: Some Days  ?  Packs/day: 0.25  ?  Years: 15.00  ?  Pack years: 3.75  ?  Types: Cigarettes  ?  Passive exposure: Current  ? Smokeless tobacco: Never  ?  Vaping Use  ? Vaping Use: Never used  ?Substance Use Topics  ? Alcohol use: Yes  ?  Alcohol/week: 42.0 standard drinks  ?  Types: 42 Cans of beer per week  ?  Comment: 3 every other day  ? Drug use: No  ? ? ? Colonoscopy: ? PAP: ? Bone density: ? Lipid panel: ? ?No Known Allergies ? ?Current Outpatient Medications  ?Medication Sig Dispense Refill  ? acetaminophen (TYLENOL) 325 MG tablet Take 2 tablets (650 mg  total) by mouth every 6 (six) hours as needed for mild pain.    ? atorvastatin (LIPITOR) 40 MG tablet Take 1 tablet (40 mg total) by mouth daily. 30 tablet 0  ? feeding supplement (ENSURE ENLIVE / ENSURE PLUS) LIQD Take 237 mLs by mouth 3 (three) times daily between meals. 21330 mL 0  ? fentaNYL (DURAGESIC) 25 MCG/HR Place 1 patch onto the skin every 3 (three) days. 10 patch 0  ? ferrous sulfate 325 (65 FE) MG tablet Take 1 tablet (325 mg total) by mouth daily. 30 tablet 0  ? folic acid (FOLVITE) 1 MG tablet Take 1 tablet (1 mg total) by mouth daily. 30 tablet 0  ? lidocaine-prilocaine (EMLA) cream Apply to affected area once 30 g 3  ? Multiple Vitamins-Minerals (MULTIVITAMIN WITH MINERALS) tablet Take 1 tablet by mouth daily.    ? nicotine (NICODERM CQ - DOSED IN MG/24 HOURS) 21 mg/24hr patch Place 21 mg onto the skin daily.    ? ondansetron (ZOFRAN) 8 MG tablet TAKE 1 TABLET 2 TIMES DAILY AS NEEDED FOR REFRACTORY NAUSEA / VOMITING. START DAY 3 AFTER CHEMO 60 tablet 2  ? Oxycodone HCl 10 MG TABS Take 1 tablet (10 mg total) by mouth every 4 (four) hours as needed. 60 tablet 0  ? prochlorperazine (COMPAZINE) 10 MG tablet Take 1 tablet (10 mg total) by mouth every 6 (six) hours as needed (Nausea or vomiting). 60 tablet 2  ? tamsulosin (FLOMAX) 0.4 MG CAPS capsule Take 1 capsule (0.4 mg total) by mouth daily after supper. 30 capsule 0  ? thiamine 100 MG tablet Take 1 tablet (100 mg total) by mouth daily. 30 tablet 0  ? omeprazole (PRILOSEC) 20 MG capsule Take 1 capsule (20 mg total) by mouth daily. 30 capsule 2  ? ?No current facility-administered medications for this visit.  ? ? ?OBJECTIVE: ?Vitals:  ? 03/12/22 0853  ?BP: (!) 109/91  ?Pulse: 99  ?Resp: 16  ?Temp: (!) 97.1 ?F (36.2 ?C)  ?SpO2: 98%  ?   Body mass index is 17.5 kg/m?Marland Kitchen    ECOG FS:1 - Symptomatic but completely ambulatory ? ?General: Well-developed, well-nourished, no acute distress. ?Eyes: Pink conjunctiva, anicteric sclera. ?HEENT: Normocephalic, moist  mucous membranes. ?Lungs: No audible wheezing or coughing. ?Heart: Regular rate and rhythm. ?Abdomen: Soft, nontender, no obvious distention. ?Musculoskeletal: No edema, cyanosis, or clubbing. ?Neuro: Alert, answering all questions appropriately. Cranial nerves grossly intact. ?Skin: No rashes or petechiae noted. ?Psych: Normal affect. ? ? ?LAB RESULTS: ? ?Lab Results  ?Component Value Date  ? NA 129 (L) 03/12/2022  ? K 3.7 03/12/2022  ? CL 94 (L) 03/12/2022  ? CO2 27 03/12/2022  ? GLUCOSE 123 (H) 03/12/2022  ? BUN 9 03/12/2022  ? CREATININE 0.62 03/12/2022  ? CALCIUM 8.8 (L) 03/12/2022  ? PROT 7.1 03/12/2022  ? ALBUMIN 2.9 (L) 03/12/2022  ? AST 17 03/12/2022  ? ALT 11 03/12/2022  ? ALKPHOS 65 03/12/2022  ? BILITOT <0.1 (L) 03/12/2022  ? GFRNONAA >  60 03/12/2022  ? GFRAA >60 10/03/2016  ? ? ?Lab Results  ?Component Value Date  ? WBC 10.9 (H) 03/12/2022  ? NEUTROABS 7.1 03/12/2022  ? HGB 10.1 (L) 03/12/2022  ? HCT 31.7 (L) 03/12/2022  ? MCV 78.3 (L) 03/12/2022  ? PLT 547 (H) 03/12/2022  ? ? ? ?STUDIES: ?CT CHEST W CONTRAST ? ?Result Date: 02/25/2022 ?CLINICAL DATA:  Metastatic colon cancer, staging. * Tracking Code: BO * EXAM: CT CHEST WITH CONTRAST TECHNIQUE: Multidetector CT imaging of the chest was performed during intravenous contrast administration. RADIATION DOSE REDUCTION: This exam was performed according to the departmental dose-optimization program which includes automated exposure control, adjustment of the mA and/or kV according to patient size and/or use of iterative reconstruction technique. CONTRAST:  69m OMNIPAQUE IOHEXOL 300 MG/ML  SOLN COMPARISON:  Abdominopelvic CT 02/22/2022.  Chest CTA 05/19/2021. FINDINGS: Cardiovascular: Multi-vessel coronary artery atherosclerosis with lesser involvement of the aorta and great vessels. No acute vascular findings are identified. The heart size is normal. There is no pericardial effusion. Mediastinum/Nodes: Small hilar lymph nodes bilaterally are similar to the  previous CT, likely reactive. No enlarged mediastinal or axillary lymph nodes demonstrated. The thyroid gland, trachea and esophagus demonstrate no significant findings. Lungs/Pleura: There are new small b

## 2022-03-11 ENCOUNTER — Ambulatory Visit (INDEPENDENT_AMBULATORY_CARE_PROVIDER_SITE_OTHER): Payer: Medicare Other | Admitting: Physician Assistant

## 2022-03-11 ENCOUNTER — Ambulatory Visit: Payer: Medicare Other | Admitting: Physician Assistant

## 2022-03-11 DIAGNOSIS — R339 Retention of urine, unspecified: Secondary | ICD-10-CM

## 2022-03-11 NOTE — Progress Notes (Signed)
Cath Change/ Replacement ? ?Patient is present today for a catheter change due to urinary retention.  59m of water was removed from the balloon, a 18FR coude foley cath was removed with ut difficulty.  Patient was cleaned and prepped in a sterile fashion with betadine and 2%lidocaine jelly was instilled into the urethra. A 18 FR foley cath was replaced into the bladder no complications were noted Urine return was noted 269mand urine was yellow in color. The balloon was filled with 1060mf sterile water. A leg bag was attached for drainage.  A night bag was also given to the patient.  Patient tolerated well. ? ?Performed by: SamDebroah LoopA-C  ? ?Additional notes: Patient prefers to be scheduled in [place] monthly exchanges moving forward. ? ?Follow up: Return in about 4 weeks (around 04/08/2022) for Catheter exchange.   ?

## 2022-03-12 ENCOUNTER — Inpatient Hospital Stay (HOSPITAL_BASED_OUTPATIENT_CLINIC_OR_DEPARTMENT_OTHER): Payer: Medicare Other | Admitting: Hospice and Palliative Medicine

## 2022-03-12 ENCOUNTER — Inpatient Hospital Stay: Payer: Medicare Other

## 2022-03-12 ENCOUNTER — Inpatient Hospital Stay (HOSPITAL_BASED_OUTPATIENT_CLINIC_OR_DEPARTMENT_OTHER): Payer: Medicare Other | Admitting: Oncology

## 2022-03-12 VITALS — BP 109/91 | HR 99 | Temp 97.1°F | Resp 16 | Ht 72.0 in | Wt 129.0 lb

## 2022-03-12 DIAGNOSIS — Z5111 Encounter for antineoplastic chemotherapy: Secondary | ICD-10-CM | POA: Diagnosis not present

## 2022-03-12 DIAGNOSIS — C20 Malignant neoplasm of rectum: Secondary | ICD-10-CM

## 2022-03-12 LAB — COMPREHENSIVE METABOLIC PANEL
ALT: 11 U/L (ref 0–44)
AST: 17 U/L (ref 15–41)
Albumin: 2.9 g/dL — ABNORMAL LOW (ref 3.5–5.0)
Alkaline Phosphatase: 65 U/L (ref 38–126)
Anion gap: 8 (ref 5–15)
BUN: 9 mg/dL (ref 8–23)
CO2: 27 mmol/L (ref 22–32)
Calcium: 8.8 mg/dL — ABNORMAL LOW (ref 8.9–10.3)
Chloride: 94 mmol/L — ABNORMAL LOW (ref 98–111)
Creatinine, Ser: 0.62 mg/dL (ref 0.61–1.24)
GFR, Estimated: >60 mL/min (ref >60)
Glucose, Bld: 123 mg/dL — ABNORMAL HIGH (ref 70–99)
Potassium: 3.7 mmol/L (ref 3.5–5.1)
Sodium: 129 mmol/L — ABNORMAL LOW (ref 135–145)
Total Bilirubin: <0.1 mg/dL — ABNORMAL LOW (ref 0.3–1.2)
Total Protein: 7.1 g/dL (ref 6.5–8.1)

## 2022-03-12 LAB — CBC WITH DIFFERENTIAL/PLATELET
Abs Immature Granulocytes: 0.04 10*3/uL (ref 0.00–0.07)
Basophils Absolute: 0.1 10*3/uL (ref 0.0–0.1)
Basophils Relative: 1 %
Eosinophils Absolute: 0.3 10*3/uL (ref 0.0–0.5)
Eosinophils Relative: 3 %
HCT: 31.7 % — ABNORMAL LOW (ref 39.0–52.0)
Hemoglobin: 10.1 g/dL — ABNORMAL LOW (ref 13.0–17.0)
Immature Granulocytes: 0 %
Lymphocytes Relative: 21 %
Lymphs Abs: 2.2 10*3/uL (ref 0.7–4.0)
MCH: 24.9 pg — ABNORMAL LOW (ref 26.0–34.0)
MCHC: 31.9 g/dL (ref 30.0–36.0)
MCV: 78.3 fL — ABNORMAL LOW (ref 80.0–100.0)
Monocytes Absolute: 1.2 10*3/uL — ABNORMAL HIGH (ref 0.1–1.0)
Monocytes Relative: 11 %
Neutro Abs: 7.1 10*3/uL (ref 1.7–7.7)
Neutrophils Relative %: 64 %
Platelets: 547 10*3/uL — ABNORMAL HIGH (ref 150–400)
RBC: 4.05 MIL/uL — ABNORMAL LOW (ref 4.22–5.81)
RDW: 22 % — ABNORMAL HIGH (ref 11.5–15.5)
WBC: 10.9 10*3/uL — ABNORMAL HIGH (ref 4.0–10.5)
nRBC: 0 % (ref 0.0–0.2)

## 2022-03-12 MED ORDER — DEXTROSE 5 % IV SOLN
Freq: Once | INTRAVENOUS | Status: AC
  Filled 2022-03-12: qty 250

## 2022-03-12 MED ORDER — OXALIPLATIN CHEMO INJECTION 100 MG/20ML
85.0000 mg/m2 | Freq: Once | INTRAVENOUS | Status: AC
  Administered 2022-03-12: 145 mg via INTRAVENOUS
  Filled 2022-03-12: qty 20

## 2022-03-12 MED ORDER — OXYCODONE HCL 5 MG PO TABS
10.0000 mg | ORAL_TABLET | Freq: Once | ORAL | Status: AC
  Administered 2022-03-12: 10 mg via ORAL
  Filled 2022-03-12: qty 2

## 2022-03-12 MED ORDER — SODIUM CHLORIDE 0.9 % IV SOLN
2400.0000 mg/m2 | INTRAVENOUS | Status: DC
  Administered 2022-03-12: 4150 mg via INTRAVENOUS
  Filled 2022-03-12: qty 83

## 2022-03-12 MED ORDER — OMEPRAZOLE 20 MG PO CPDR: 20.0000 mg | DELAYED_RELEASE_CAPSULE | Freq: Every day | ORAL | 2 refills | Status: DC

## 2022-03-12 MED ORDER — SODIUM CHLORIDE 0.9% FLUSH
10.0000 mL | Freq: Once | INTRAVENOUS | Status: AC
  Administered 2022-03-12: 10 mL via INTRAVENOUS
  Filled 2022-03-12: qty 10

## 2022-03-12 MED ORDER — SODIUM CHLORIDE 0.9 % IV SOLN
10.0000 mg | Freq: Once | INTRAVENOUS | Status: AC
  Administered 2022-03-12: 10 mg via INTRAVENOUS
  Filled 2022-03-12: qty 10

## 2022-03-12 MED ORDER — PALONOSETRON HCL INJECTION 0.25 MG/5ML
0.2500 mg | Freq: Once | INTRAVENOUS | Status: AC
  Administered 2022-03-12: 0.25 mg via INTRAVENOUS
  Filled 2022-03-12: qty 5

## 2022-03-12 MED ORDER — FLUOROURACIL CHEMO INJECTION 2.5 GM/50ML
400.0000 mg/m2 | Freq: Once | INTRAVENOUS | Status: AC
  Administered 2022-03-12: 700 mg via INTRAVENOUS
  Filled 2022-03-12: qty 14

## 2022-03-12 MED ORDER — LEUCOVORIN CALCIUM INJECTION 350 MG
700.0000 mg | Freq: Once | INTRAVENOUS | Status: AC
  Administered 2022-03-12: 700 mg via INTRAVENOUS
  Filled 2022-03-12: qty 35

## 2022-03-12 NOTE — Progress Notes (Signed)
? ?  ?Palliative Medicine ?[place] at [facility] ?Telephone:([phone]) [phone] Fax:(336) [phone] ? ? ?Name: Erma Joubert ?Date: 03/12/2022 ?MRN: [id number]  ?DOB: [birth year] ? ?Patient Care Team: ?[name], MD as PCP - General (Cardiology) ?Clent Jacks, RN as Oncology Nurse Navigator  ? ? ?REASON FOR CONSULTATION: ?Layla Gramm is a 66 y.o. 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  Patient has been symptomatic with pain and poor appetite.  He is referred to palliative care to help address goals and manage ongoing symptoms. ? ?SOCIAL HISTORY:    ? reports that he has been smoking cigarettes. He has a 3.75 pack-year smoking history. He has been exposed to tobacco smoke. He has never used smokeless tobacco. He reports current alcohol use of about 42.0 standard drinks per week. He reports that he does not use drugs. ? ?Patient lives at home with his wife and 3 children (ages 31 through 55).  He also has an 18 year old daughter.  Patient was working as a Recruitment consultant until recently.  ? ?ADVANCE DIRECTIVES:  ?Does not have ? ?CODE STATUS:  ? ?PAST MEDICAL HISTORY: ?Past Medical History:  ?Diagnosis Date  ? AC joint dislocation   ? CAD (coronary artery disease)   ? CVA (cerebral vascular accident) [facility] [place])   ? Metatarsal bone fracture   ? 3rd metatarsal , left foot; April 2021  ? Syncope   ? June '22  ? Tobacco abuse   ? ? ?PAST SURGICAL HISTORY:  ?Past Surgical History:  ?Procedure Laterality Date  ? ACROMIO-CLAVICULAR JOINT REPAIR Left 09/11/2016  ? Procedure: ACROMIO-CLAVICULAR RECONSTRUCTION;  Surgeon: Corky Mull, MD;  Location: [facility] ORS;  Service: Orthopedics;  Laterality: Left;  clavicle  ? FLEXIBLE SIGMOIDOSCOPY N/A 02/24/2022  ? Procedure: FLEXIBLE SIGMOIDOSCOPY;  Surgeon: Lesly Rubenstein,  MD;  Location: [facility] ENDOSCOPY;  Service: Endoscopy;  Laterality: N/A;  ? IR IMAGING GUIDED PORT INSERTION  03/10/2022  ? LEFT HEART CATH AND CORONARY ANGIOGRAPHY N/A 05/20/2021  ? Procedure: LEFT HEART CATH AND CORONARY ANGIOGRAPHY with coronary intervention;  Surgeon: [name], MD;  Location: [facility];  Service: Cardiovascular;  Laterality: N/A;  ? MANDIBLE RECONSTRUCTION    ? TONSILLECTOMY    ? AGE 32  ? XI ROBOTIC ASSISTED INGUINAL HERNIA REPAIR WITH MESH Right 03/26/2020  ? Procedure: XI ROBOTIC ASSISTED INGUINAL HERNIA REPAIR WITH MESH;  Surgeon: Herbert Pun, MD;  Location: [facility] ORS;  Service: General;  Laterality: Right;  ? ? ?HEMATOLOGY/ONCOLOGY HISTORY:  ?Oncology History  ?Adenocarcinoma of rectum ([place])  ?02/25/2022 Initial Diagnosis  ? Adenocarcinoma of rectum ([place]) ? ?  ?03/10/2022 Cancer Staging  ? Staging form: Colon and Rectum, AJCC 8th Edition ?- Clinical stage from 03/10/2022: Stage IIA (cT3, cN0, cM0) - Signed by Lloyd Huger, MD on 03/10/2022 ?Stage prefix: Initial diagnosis ?Total positive nodes: 0 ? ?  ?03/12/2022 -  Chemotherapy  ? Patient is on Treatment Plan : COLORECTAL FOLFOX q14d x 4 months  ? ?  ?  ? ? ?ALLERGIES:  has No Known Allergies. ? ?MEDICATIONS:  ?Current Outpatient Medications  ?Medication Sig Dispense Refill  ? acetaminophen (TYLENOL) 325 MG tablet Take 2 tablets (650 mg total) by mouth every 6 (six) hours as needed for mild pain.    ? atorvastatin (LIPITOR) 40 MG tablet Take 1 tablet (40 mg total) by mouth  daily. 30 tablet 0  ? feeding supplement (ENSURE ENLIVE / ENSURE PLUS) LIQD Take 237 mLs by mouth 3 (three) times daily between meals. 21330 mL 0  ? fentaNYL (DURAGESIC) 25 MCG/HR Place 1 patch onto the skin every 3 (three) days. 10 patch 0  ? ferrous sulfate 325 (65 FE) MG tablet Take 1 tablet (325 mg total) by mouth daily. 30 tablet 0  ? folic acid (FOLVITE) 1 MG tablet Take 1 tablet (1 mg total) by mouth daily. 30 tablet 0  ? lidocaine-prilocaine  (EMLA) cream Apply to affected area once 30 g 3  ? Multiple Vitamins-Minerals (MULTIVITAMIN WITH MINERALS) tablet Take 1 tablet by mouth daily.    ? nicotine (NICODERM CQ - DOSED IN MG/24 HOURS) 21 mg/24hr patch Place 21 mg onto the skin daily.    ? ondansetron (ZOFRAN) 8 MG tablet TAKE 1 TABLET 2 TIMES DAILY AS NEEDED FOR REFRACTORY NAUSEA / VOMITING. START DAY 3 AFTER CHEMO 60 tablet 2  ? Oxycodone HCl 10 MG TABS Take 1 tablet (10 mg total) by mouth every 4 (four) hours as needed. 60 tablet 0  ? prochlorperazine (COMPAZINE) 10 MG tablet Take 1 tablet (10 mg total) by mouth every 6 (six) hours as needed (Nausea or vomiting). 60 tablet 2  ? tamsulosin (FLOMAX) 0.4 MG CAPS capsule Take 1 capsule (0.4 mg total) by mouth daily after supper. 30 capsule 0  ? thiamine 100 MG tablet Take 1 tablet (100 mg total) by mouth daily. 30 tablet 0  ? ?No current facility-administered medications for this visit.  ? ?Facility-Administered Medications Ordered in Other Visits  ?Medication Dose Route Frequency Provider Last Rate Last Admin  ? fluorouracil (ADRUCIL) 4,150 mg in sodium chloride 0.9 % 67 mL chemo infusion  2,400 mg/m2 (Treatment Plan Recorded) Intravenous 1 day or 1 dose Grayland Ormond, Kathlene November, MD      ? fluorouracil (ADRUCIL) chemo injection 700 mg  400 mg/m2 (Treatment Plan Recorded) Intravenous Once Lloyd Huger, MD      ? leucovorin 700 mg in dextrose 5 % 250 mL infusion  700 mg Intravenous Once Lloyd Huger, MD      ? oxaliplatin (ELOXATIN) 145 mg in dextrose 5 % 500 mL chemo infusion  85 mg/m2 (Treatment Plan Recorded) Intravenous Once Lloyd Huger, MD      ? ? ?VITAL SIGNS: ?There were no vitals taken for this visit. ?There were no vitals filed for this visit.  ?Estimated body mass index is 17.5 kg/m? as calculated from the following: ?  Height as of an earlier encounter on 03/12/22: 6' (1.829 m). ?  Weight as of an earlier encounter on 03/12/22: 129 lb (58.5 kg). ? ?LABS: ?CBC: ?   ?Component Value  Date/Time  ? WBC 10.9 (H) 03/12/2022 0834  ? HGB 10.1 (L) 03/12/2022 0834  ? HCT 31.7 (L) 03/12/2022 0834  ? PLT 547 (H) 03/12/2022 0834  ? MCV 78.3 (L) 03/12/2022 0834  ? NEUTROABS 7.1 03/12/2022 0834  ? LYMPHSABS 2.2 03/12/2022 0834  ? MONOABS 1.2 (H) 03/12/2022 0834  ? EOSABS 0.3 03/12/2022 0834  ? BASOSABS 0.1 03/12/2022 0834  ? ?Comprehensive Metabolic Panel: ?   ?Component Value Date/Time  ? NA 129 (L) 03/12/2022 0834  ? K 3.7 03/12/2022 0834  ? CL 94 (L) 03/12/2022 0834  ? CO2 27 03/12/2022 0834  ? BUN 9 03/12/2022 0834  ? CREATININE 0.62 03/12/2022 0834  ? GLUCOSE 123 (H) 03/12/2022 0834  ? CALCIUM 8.8 (L) 03/12/2022 0834  ?  AST 17 03/12/2022 0834  ? ALT 11 03/12/2022 0834  ? ALKPHOS 65 03/12/2022 0834  ? BILITOT <0.1 (L) 03/12/2022 0834  ? PROT 7.1 03/12/2022 0834  ? ALBUMIN 2.9 (L) 03/12/2022 0834  ? ? ?RADIOGRAPHIC STUDIES: ?CT CHEST W CONTRAST ? ?Result Date: 02/25/2022 ?CLINICAL DATA:  Metastatic colon cancer, staging. * Tracking Code: BO * EXAM: CT CHEST WITH CONTRAST TECHNIQUE: Multidetector CT imaging of the chest was performed during intravenous contrast administration. RADIATION DOSE REDUCTION: This exam was performed according to the departmental dose-optimization program which includes automated exposure control, adjustment of the mA and/or kV according to patient size and/or use of iterative reconstruction technique. CONTRAST:  6m OMNIPAQUE IOHEXOL 300 MG/ML  SOLN COMPARISON:  Abdominopelvic CT 02/22/2022.  Chest CTA 05/19/2021. FINDINGS: Cardiovascular: Multi-vessel coronary artery atherosclerosis with lesser involvement of the aorta and great vessels. No acute vascular findings are identified. The heart size is normal. There is no pericardial effusion. Mediastinum/Nodes: Small hilar lymph nodes bilaterally are similar to the previous CT, likely reactive. No enlarged mediastinal or axillary lymph nodes demonstrated. The thyroid gland, trachea and esophagus demonstrate no significant findings.  Lungs/Pleura: There are new small bilateral pleural effusions with associated bibasilar atelectasis compared with the abdominal CT performed 3 days ago. Underlying mild centrilobular emphysema with new

## 2022-03-12 NOTE — Patient Instructions (Signed)
[facility] [place]  Discharge Instructions: ?Thank you for choosing [facility] to provide your oncology and hematology care.  ?If you have a lab appointment with the [facility], please go directly to the [facility] and check in at the registration area. ? ?Wear comfortable clothing and clothing appropriate for easy access to any Portacath or PICC line.  ? ?We strive to give you quality time with your provider. You may need to reschedule your appointment if you arrive late (15 or more minutes).  Arriving late affects you and other patients whose appointments are after yours.  Also, if you miss three or more appointments without notifying the office, you may be dismissed from the clinic at the provider?s discretion.    ?  ?For prescription refill requests, have your pharmacy contact our office and allow 72 hours for refills to be completed.   ? ?Today you received the following chemotherapy and/or immunotherapy agents oxaliplatin, leucovorin, adrucil  ? ?Fluorouracil, 5-FU injection ?What is this medication? ?FLUOROURACIL, 5-FU (flure oh YOOR a sil) is a chemotherapy drug. It slows the growth of cancer cells. This medicine is used to treat many types of cancer like breast cancer, colon or rectal cancer, pancreatic cancer, and stomach cancer. ?This medicine may be used for other purposes; ask your health care provider or pharmacist if you have questions. ?COMMON BRAND NAME(S): Adrucil ?What should I tell my care team before I take this medication? ?They need to know if you have any of these conditions: ?blood disorders ?dihydropyrimidine dehydrogenase (DPD) deficiency ?infection (especially a virus infection such as chickenpox, cold sores, or herpes) ?kidney disease ?liver disease ?malnourished, poor nutrition ?recent or ongoing radiation therapy ?an unusual or allergic reaction to fluorouracil, other chemotherapy, other medicines, foods, dyes, or preservatives ?pregnant  or trying to get pregnant ?breast-feeding ?How should I use this medication? ?This drug is given as an infusion or injection into a vein. It is administered in a hospital or clinic by a specially trained health care professional. ?Talk to your pediatrician regarding the use of this medicine in children. Special care may be needed. ?Overdosage: If you think you have taken too much of this medicine contact a poison control center or emergency room at once. ?NOTE: This medicine is only for you. Do not share this medicine with others. ?What if I miss a dose? ?It is important not to miss your dose. Call your doctor or health care professional if you are unable to keep an appointment. ?What may interact with this medication? ?Do not take this medicine with any of the following medications: ?live virus vaccines ?This medicine may also interact with the following medications: ?medicines that treat or prevent blood clots like warfarin, enoxaparin, and dalteparin ?This list may not describe all possible interactions. Give your health care provider a list of all the medicines, herbs, non-prescription drugs, or dietary supplements you use. Also tell them if you smoke, drink alcohol, or use illegal drugs. Some items may interact with your medicine. ?What should I watch for while using this medication? ?Visit your doctor for checks on your progress. This drug may make you feel generally unwell. This is not uncommon, as chemotherapy can affect healthy cells as well as cancer cells. Report any side effects. Continue your course of treatment even though you feel ill unless your doctor tells you to stop. ?In some cases, you may be given additional medicines to help with side effects. Follow all directions for their use. ?Call  your doctor or health care professional for advice if you get a fever, chills or sore throat, or other symptoms of a cold or flu. Do not treat yourself. This drug decreases your body's ability to fight  infections. Try to avoid being around people who are sick. ?This medicine may increase your risk to bruise or bleed. Call your doctor or health care professional if you notice any unusual bleeding. ?Be careful brushing and flossing your teeth or using a toothpick because you may get an infection or bleed more easily. If you have any dental work done, tell your dentist you are receiving this medicine. ?Avoid taking products that contain aspirin, acetaminophen, ibuprofen, naproxen, or ketoprofen unless instructed by your doctor. These medicines may hide a fever. ?Do not become pregnant while taking this medicine. Women should inform their doctor if they wish to become pregnant or think they might be pregnant. There is a potential for serious side effects to an unborn child. Talk to your health care professional or pharmacist for more information. Do not breast-feed an infant while taking this medicine. ?Men should inform their doctor if they wish to father a child. This medicine may lower sperm counts. ?Do not treat diarrhea with over the counter products. Contact your doctor if you have diarrhea that lasts more than 2 days or if it is severe and watery. ?This medicine can make you more sensitive to the sun. Keep out of the sun. If you cannot avoid being in the sun, wear protective clothing and use sunscreen. Do not use sun lamps or tanning beds/booths. ?What side effects may I notice from receiving this medication? ?Side effects that you should report to your doctor or health care professional as soon as possible: ?allergic reactions like skin rash, itching or hives, swelling of the face, lips, or tongue ?low blood counts - this medicine may decrease the number of white blood cells, red blood cells and platelets. You may be at increased risk for infections and bleeding. ?signs of infection - fever or chills, cough, sore throat, pain or difficulty passing urine ?signs of decreased platelets or bleeding - bruising,  pinpoint red spots on the skin, black, tarry stools, blood in the urine ?signs of decreased red blood cells - unusually weak or tired, fainting spells, lightheadedness ?breathing problems ?changes in vision ?chest pain ?mouth sores ?nausea and vomiting ?pain, swelling, redness at site where injected ?pain, tingling, numbness in the hands or feet ?redness, swelling, or sores on hands or feet ?stomach pain ?unusual bleeding ?Side effects that usually do not require medical attention (report to your doctor or health care professional if they continue or are bothersome): ?changes in finger or toe nails ?diarrhea ?dry or itchy skin ?hair loss ?headache ?loss of appetite ?sensitivity of eyes to the light ?stomach upset ?unusually teary eyes ?This list may not describe all possible side effects. Call your doctor for medical advice about side effects. You may report side effects to FDA at 1-800-FDA-1088. ?Where should I keep my medication? ?This drug is given in a hospital or clinic and will not be stored at home. ?NOTE: This sheet is a summary. It may not cover all possible information. If you have questions about this medicine, talk to your doctor, pharmacist, or health care provider. ?? 2023 Elsevier/Gold Standard (2021-10-11 00:00:00) ? ? ?Leucovorin injection ?What is this medication? ?LEUCOVORIN (loo koe VOR in) is used to prevent or treat the harmful effects of some medicines. This medicine is used to treat anemia caused by  a low amount of folic acid in the body. It is also used with 5-fluorouracil (5-FU) to treat colon cancer. ?This medicine may be used for other purposes; ask your health care provider or pharmacist if you have questions. ?What should I tell my care team before I take this medication? ?They need to know if you have any of these conditions: ?anemia from low levels of vitamin B-12 in the blood ?an unusual or allergic reaction to leucovorin, folic acid, other medicines, foods, dyes, or  preservatives ?pregnant or trying to get pregnant ?breast-feeding ?How should I use this medication? ?This medicine is for injection into a muscle or into a vein. It is given by a health care professional in a hospital or clinic

## 2022-03-12 NOTE — Progress Notes (Signed)
Pain level of 5/10 in abdomen. Hasnt taken any pain medication. ?

## 2022-03-13 ENCOUNTER — Telehealth: Payer: Self-pay

## 2022-03-13 ENCOUNTER — Ambulatory Visit: Payer: Medicaid Other | Admitting: Urology

## 2022-03-13 ENCOUNTER — Encounter: Payer: Self-pay | Admitting: Oncology

## 2022-03-13 NOTE — Telephone Encounter (Signed)
Telephone call to patient for follow up after receiving first infusion.   No answer but left message stating we were calling to check on them.  Encouraged patient to call for any questions or concerns.   

## 2022-03-14 ENCOUNTER — Other Ambulatory Visit: Payer: Self-pay | Admitting: *Deleted

## 2022-03-14 ENCOUNTER — Inpatient Hospital Stay: Payer: Medicare Other

## 2022-03-14 DIAGNOSIS — C20 Malignant neoplasm of rectum: Secondary | ICD-10-CM

## 2022-03-14 DIAGNOSIS — Z5111 Encounter for antineoplastic chemotherapy: Secondary | ICD-10-CM | POA: Diagnosis not present

## 2022-03-14 MED ORDER — SODIUM CHLORIDE 0.9% FLUSH
10.0000 mL | INTRAVENOUS | Status: DC | PRN
  Administered 2022-03-14: 10 mL
  Filled 2022-03-14: qty 10

## 2022-03-14 MED ORDER — HEPARIN SOD (PORK) LOCK FLUSH 100 UNIT/ML IV SOLN
500.0000 [IU] | Freq: Once | INTRAVENOUS | Status: AC | PRN
  Administered 2022-03-14: 500 [IU]
  Filled 2022-03-14: qty 5

## 2022-03-14 MED ORDER — OXYCODONE HCL 5 MG PO TABS: 5.0000 mg | ORAL_TABLET | ORAL | 0 refills | Status: DC | PRN

## 2022-03-14 NOTE — Progress Notes (Signed)
Patient chemo pump d/c'd today, no concerns voiced. Stable at discharge. AVS given.     ?

## 2022-03-14 NOTE — Progress Notes (Signed)
?[facility]  ?[phone] [phone] ? ?ID: Kyle Larson OB: [birth year]  MR#: [id number]  CMK#:[id number] ? ?Patient Care Team: ?[name], MD as PCP - General (Cardiology) ?Clent Jacks, RN as Oncology Nurse Navigator ? ?CHIEF COMPLAINT: Stage IIa adenocarcinoma of the rectum. ? ?INTERVAL HISTORY: Patient returns to clinic today for further evaluation and to assess his toleration of cycle 1 of neoadjuvant FOLFOX.  He tolerated treatment relatively well, but has significant mouth pain secondary to gingivitis/poor dentition and has difficulty eating.  He also continues to have abdominal pain. He continues to have weakness and fatigue.  He has no neurologic complaints.  He denies any recent fevers or illnesses.  He has no chest pain, shortness of breath, cough, or hemoptysis.  He has no nausea, vomiting, constipation, or diarrhea.  He does not report any melena or hematochezia.  He has no urinary complaints.  Patient offers no further specific complaints today. ? ?REVIEW OF SYSTEMS:   ?Review of Systems  ?Constitutional:  Positive for malaise/fatigue. Negative for fever and weight loss.  ?Respiratory: Negative.  Negative for cough, hemoptysis and shortness of breath.   ?Cardiovascular: Negative.  Negative for chest pain and leg swelling.  ?Gastrointestinal: Negative.  Negative for abdominal pain, blood in stool, constipation, diarrhea and melena.  ?Genitourinary: Negative.  Negative for dysuria.  ?Musculoskeletal:   ?     Pelvic pain  ?Skin: Negative.  Negative for rash.  ?Neurological:  Positive for weakness. Negative for dizziness, speech change, focal weakness and headaches.  ?Psychiatric/Behavioral: Negative.  The patient is not nervous/anxious.   ? ?As per HPI. Otherwise, a complete review of systems is negative. ? ?PAST MEDICAL HISTORY: ?Past Medical History:  ?Diagnosis Date  ? AC joint dislocation   ? CAD (coronary artery disease)   ? CVA (cerebral vascular  accident) [facility])   ? Metatarsal bone fracture   ? 3rd metatarsal , left foot; April 2021  ? Syncope   ? June '22  ? Tobacco abuse   ? ? ?PAST SURGICAL HISTORY: ?Past Surgical History:  ?Procedure Laterality Date  ? ACROMIO-CLAVICULAR JOINT REPAIR Left 09/11/2016  ? Procedure: ACROMIO-CLAVICULAR RECONSTRUCTION;  Surgeon: Corky Mull, MD;  Location: [facility] ORS;  Service: Orthopedics;  Laterality: Left;  clavicle  ? FLEXIBLE SIGMOIDOSCOPY N/A 02/24/2022  ? Procedure: FLEXIBLE SIGMOIDOSCOPY;  Surgeon: Lesly Rubenstein, MD;  Location: [facility] ENDOSCOPY;  Service: Endoscopy;  Laterality: N/A;  ? IR IMAGING GUIDED PORT INSERTION  03/10/2022  ? LEFT HEART CATH AND CORONARY ANGIOGRAPHY N/A 05/20/2021  ? Procedure: LEFT HEART CATH AND CORONARY ANGIOGRAPHY with coronary intervention;  Surgeon: [name], MD;  Location: [facility];  Service: Cardiovascular;  Laterality: N/A;  ? MANDIBLE RECONSTRUCTION    ? TONSILLECTOMY    ? AGE 42  ? XI ROBOTIC ASSISTED INGUINAL HERNIA REPAIR WITH MESH Right 03/26/2020  ? Procedure: XI ROBOTIC ASSISTED INGUINAL HERNIA REPAIR WITH MESH;  Surgeon: Herbert Pun, MD;  Location: [facility] ORS;  Service: General;  Laterality: Right;  ? ? ?FAMILY HISTORY: ?Family History  ?Problem Relation Age of Onset  ? Diabetes Mother   ? Prostate cancer Father   ? ? ?ADVANCED DIRECTIVES (Y/N):  N ? ?HEALTH MAINTENANCE: ?Social History  ? ?Tobacco Use  ? Smoking status: Some Days  ?  Packs/day: 0.25  ?  Years: 15.00  ?  Pack years: 3.75  ?  Types: Cigarettes  ?  Passive exposure: Current  ? Smokeless tobacco: Never  ?Vaping  Use  ? Vaping Use: Never used  ?Substance Use Topics  ? Alcohol use: Yes  ?  Alcohol/week: 42.0 standard drinks  ?  Types: 42 Cans of beer per week  ?  Comment: 3 every other day  ? Drug use: No  ? ? ? Colonoscopy: ? PAP: ? Bone density: ? Lipid panel: ? ?No Known Allergies ? ?Current Outpatient Medications  ?Medication Sig Dispense Refill  ? acetaminophen (TYLENOL) 325 MG tablet  Take 2 tablets (650 mg total) by mouth every 6 (six) hours as needed for mild pain.    ? amoxicillin-clavulanate (AUGMENTIN) 125-31.25 MG/5ML suspension Take 5 mLs (125 mg total) by mouth 3 (three) times daily for 7 days. 105 mL 0  ? atorvastatin (LIPITOR) 40 MG tablet Take 1 tablet (40 mg total) by mouth daily. 30 tablet 0  ? feeding supplement (ENSURE ENLIVE / ENSURE PLUS) LIQD Take 237 mLs by mouth 3 (three) times daily between meals. 21330 mL 0  ? fentaNYL (DURAGESIC) 25 MCG/HR Place 1 patch onto the skin every 3 (three) days. 10 patch 0  ? ferrous sulfate 325 (65 FE) MG tablet Take 1 tablet (325 mg total) by mouth daily. 30 tablet 0  ? folic acid (FOLVITE) 1 MG tablet Take 1 tablet (1 mg total) by mouth daily. 30 tablet 0  ? lidocaine-prilocaine (EMLA) cream Apply to affected area once 30 g 3  ? Multiple Vitamins-Minerals (MULTIVITAMIN WITH MINERALS) tablet Take 1 tablet by mouth daily.    ? nicotine (NICODERM CQ - DOSED IN MG/24 HOURS) 21 mg/24hr patch Place 21 mg onto the skin daily.    ? omeprazole (PRILOSEC) 20 MG capsule Take 1 capsule (20 mg total) by mouth daily. 30 capsule 2  ? ondansetron (ZOFRAN) 8 MG tablet TAKE 1 TABLET 2 TIMES DAILY AS NEEDED FOR REFRACTORY NAUSEA / VOMITING. START DAY 3 AFTER CHEMO 60 tablet 2  ? oxyCODONE (OXY IR/ROXICODONE) 5 MG immediate release tablet Take 1 tablet (5 mg total) by mouth every 4 (four) hours as needed for severe pain. 60 tablet 0  ? prochlorperazine (COMPAZINE) 10 MG tablet Take 1 tablet (10 mg total) by mouth every 6 (six) hours as needed (Nausea or vomiting). 60 tablet 2  ? tamsulosin (FLOMAX) 0.4 MG CAPS capsule Take 1 capsule (0.4 mg total) by mouth daily after supper. 30 capsule 0  ? thiamine 100 MG tablet Take 1 tablet (100 mg total) by mouth daily. 30 tablet 0  ? Oxycodone HCl 10 MG TABS Take 1 tablet (10 mg total) by mouth every 4 (four) hours as needed. (Patient not taking: Reported on 03/20/2022) 60 tablet 0  ? ?No current facility-administered  medications for this visit.  ? ? ?OBJECTIVE: ?Vitals:  ? 03/20/22 1401  ?BP: 118/88  ?Pulse: (!) 113  ?Resp: 16  ?Temp: (!) 97.5 ?F (36.4 ?C)  ?SpO2: 98%  ?   Body mass index is 16.97 kg/m?Marland Kitchen    ECOG FS:1 - Symptomatic but completely ambulatory ? ?General: Well-developed, well-nourished, no acute distress. ?Eyes: Pink conjunctiva, anicteric sclera. ?HEENT: Normocephalic, moist mucous membranes.  Poor dentition. ?Lungs: No audible wheezing or coughing. ?Heart: Regular rate and rhythm. ?Abdomen: Soft, nontender, no obvious distention. ?Musculoskeletal: No edema, cyanosis, or clubbing. ?Neuro: Alert, answering all questions appropriately. Cranial nerves grossly intact. ?Skin: No rashes or petechiae noted. ?Psych: Normal affect. ? ? ?LAB RESULTS: ? ?Lab Results  ?Component Value Date  ? NA 130 (L) 03/20/2022  ? K 3.9 03/20/2022  ? CL 97 (L)  03/20/2022  ? CO2 25 03/20/2022  ? GLUCOSE 159 (H) 03/20/2022  ? BUN 9 03/20/2022  ? CREATININE 0.65 03/20/2022  ? CALCIUM 9.0 03/20/2022  ? PROT 7.6 03/20/2022  ? ALBUMIN 3.3 (L) 03/20/2022  ? AST 22 03/20/2022  ? ALT 13 03/20/2022  ? ALKPHOS 76 03/20/2022  ? BILITOT 0.5 03/20/2022  ? GFRNONAA >60 03/20/2022  ? GFRAA >60 10/03/2016  ? ? ?Lab Results  ?Component Value Date  ? WBC 10.1 03/20/2022  ? NEUTROABS 7.3 03/20/2022  ? HGB 11.5 (L) 03/20/2022  ? HCT 36.0 (L) 03/20/2022  ? MCV 78.3 (L) 03/20/2022  ? PLT 401 (H) 03/20/2022  ? ? ? ?STUDIES: ?CT CHEST W CONTRAST ? ?Result Date: 02/25/2022 ?CLINICAL DATA:  Metastatic colon cancer, staging. * Tracking Code: BO * EXAM: CT CHEST WITH CONTRAST TECHNIQUE: Multidetector CT imaging of the chest was performed during intravenous contrast administration. RADIATION DOSE REDUCTION: This exam was performed according to the departmental dose-optimization program which includes automated exposure control, adjustment of the mA and/or kV according to patient size and/or use of iterative reconstruction technique. CONTRAST:  71m OMNIPAQUE IOHEXOL 300  MG/ML  SOLN COMPARISON:  Abdominopelvic CT 02/22/2022.  Chest CTA 05/19/2021. FINDINGS: Cardiovascular: Multi-vessel coronary artery atherosclerosis with lesser involvement of the aorta and great vessels. No ac

## 2022-03-14 NOTE — Telephone Encounter (Signed)
Patient called requesting a refill of his Oxycodone, states he does not want 10 mg, he wants the 5 mg tabs.  ?

## 2022-03-17 ENCOUNTER — Encounter: Payer: Self-pay | Admitting: Oncology

## 2022-03-20 ENCOUNTER — Inpatient Hospital Stay: Payer: Medicare Other

## 2022-03-20 ENCOUNTER — Inpatient Hospital Stay (HOSPITAL_BASED_OUTPATIENT_CLINIC_OR_DEPARTMENT_OTHER): Payer: Medicare Other | Admitting: Oncology

## 2022-03-20 ENCOUNTER — Encounter: Payer: Self-pay | Admitting: Oncology

## 2022-03-20 ENCOUNTER — Other Ambulatory Visit: Payer: Self-pay | Admitting: Oncology

## 2022-03-20 VITALS — BP 118/88 | HR 113 | Temp 97.5°F | Resp 16 | Ht 72.0 in | Wt 125.1 lb

## 2022-03-20 DIAGNOSIS — C20 Malignant neoplasm of rectum: Secondary | ICD-10-CM

## 2022-03-20 DIAGNOSIS — Z5111 Encounter for antineoplastic chemotherapy: Secondary | ICD-10-CM | POA: Diagnosis not present

## 2022-03-20 LAB — CBC WITH DIFFERENTIAL/PLATELET
Abs Immature Granulocytes: 0.06 10*3/uL (ref 0.00–0.07)
Basophils Absolute: 0.1 10*3/uL (ref 0.0–0.1)
Basophils Relative: 1 %
Eosinophils Absolute: 0.5 10*3/uL (ref 0.0–0.5)
Eosinophils Relative: 5 %
HCT: 36 % — ABNORMAL LOW (ref 39.0–52.0)
Hemoglobin: 11.5 g/dL — ABNORMAL LOW (ref 13.0–17.0)
Immature Granulocytes: 1 %
Lymphocytes Relative: 16 %
Lymphs Abs: 1.6 10*3/uL (ref 0.7–4.0)
MCH: 25 pg — ABNORMAL LOW (ref 26.0–34.0)
MCHC: 31.9 g/dL (ref 30.0–36.0)
MCV: 78.3 fL — ABNORMAL LOW (ref 80.0–100.0)
Monocytes Absolute: 0.6 10*3/uL (ref 0.1–1.0)
Monocytes Relative: 6 %
Neutro Abs: 7.3 10*3/uL (ref 1.7–7.7)
Neutrophils Relative %: 71 %
Platelets: 401 10*3/uL — ABNORMAL HIGH (ref 150–400)
RBC: 4.6 MIL/uL (ref 4.22–5.81)
RDW: 22 % — ABNORMAL HIGH (ref 11.5–15.5)
WBC: 10.1 10*3/uL (ref 4.0–10.5)
nRBC: 0 % (ref 0.0–0.2)

## 2022-03-20 LAB — COMPREHENSIVE METABOLIC PANEL
ALT: 13 U/L (ref 0–44)
AST: 22 U/L (ref 15–41)
Albumin: 3.3 g/dL — ABNORMAL LOW (ref 3.5–5.0)
Alkaline Phosphatase: 76 U/L (ref 38–126)
Anion gap: 8 (ref 5–15)
BUN: 9 mg/dL (ref 8–23)
CO2: 25 mmol/L (ref 22–32)
Calcium: 9 mg/dL (ref 8.9–10.3)
Chloride: 97 mmol/L — ABNORMAL LOW (ref 98–111)
Creatinine, Ser: 0.65 mg/dL (ref 0.61–1.24)
GFR, Estimated: >60 mL/min (ref >60)
Glucose, Bld: 159 mg/dL — ABNORMAL HIGH (ref 70–99)
Potassium: 3.9 mmol/L (ref 3.5–5.1)
Sodium: 130 mmol/L — ABNORMAL LOW (ref 135–145)
Total Bilirubin: 0.5 mg/dL (ref 0.3–1.2)
Total Protein: 7.6 g/dL (ref 6.5–8.1)

## 2022-03-20 MED ORDER — AUGMENTIN 125-31.25 MG/5ML PO SUSR: 125.0000 mg | Freq: Three times a day (TID) | ORAL | 0 refills | Status: DC

## 2022-03-21 ENCOUNTER — Encounter: Payer: Self-pay | Admitting: Oncology

## 2022-03-21 ENCOUNTER — Telehealth: Payer: Self-pay | Admitting: *Deleted

## 2022-03-21 ENCOUNTER — Other Ambulatory Visit: Payer: Self-pay | Admitting: Emergency Medicine

## 2022-03-21 MED ORDER — CEPHALEXIN 125 MG/5ML PO SUSR
125.0000 mg | Freq: Four times a day (QID) | ORAL | 0 refills | Status: AC
Start: 2022-03-21 — End: 2022-03-28

## 2022-03-21 NOTE — Telephone Encounter (Signed)
Patient informed of new prescription sent and to download the Good prescription card for a discount. He was very appreciative and thanked [place] for taking care of this ? ?

## 2022-03-21 NOTE — Telephone Encounter (Signed)
Patient called stating that the prescription sent for his infected teeth yesterday is too expensive $800 and he cannot afford that He is asking for something else to be called in for him so that he can eat ?

## 2022-03-26 ENCOUNTER — Inpatient Hospital Stay: Payer: Medicare Other | Attending: Oncology

## 2022-03-26 ENCOUNTER — Encounter: Payer: Self-pay | Admitting: Nurse Practitioner

## 2022-03-26 ENCOUNTER — Other Ambulatory Visit: Payer: Self-pay | Admitting: Nurse Practitioner

## 2022-03-26 ENCOUNTER — Inpatient Hospital Stay (HOSPITAL_BASED_OUTPATIENT_CLINIC_OR_DEPARTMENT_OTHER): Payer: Medicare Other | Admitting: Nurse Practitioner

## 2022-03-26 ENCOUNTER — Inpatient Hospital Stay: Payer: Medicare Other

## 2022-03-26 VITALS — BP 104/72 | HR 88 | Temp 97.6°F | Resp 18 | Ht 72.0 in | Wt 124.5 lb

## 2022-03-26 DIAGNOSIS — C20 Malignant neoplasm of rectum: Secondary | ICD-10-CM | POA: Diagnosis not present

## 2022-03-26 DIAGNOSIS — Z79899 Other long term (current) drug therapy: Secondary | ICD-10-CM | POA: Diagnosis not present

## 2022-03-26 DIAGNOSIS — Z5111 Encounter for antineoplastic chemotherapy: Secondary | ICD-10-CM | POA: Insufficient documentation

## 2022-03-26 DIAGNOSIS — K1379 Other lesions of oral mucosa: Secondary | ICD-10-CM

## 2022-03-26 LAB — CBC WITH DIFFERENTIAL/PLATELET
Abs Immature Granulocytes: 0.02 10*3/uL (ref 0.00–0.07)
Basophils Absolute: 0.1 10*3/uL (ref 0.0–0.1)
Basophils Relative: 1 %
Eosinophils Absolute: 0.4 10*3/uL (ref 0.0–0.5)
Eosinophils Relative: 5 %
HCT: 35.5 % — ABNORMAL LOW (ref 39.0–52.0)
Hemoglobin: 11.3 g/dL — ABNORMAL LOW (ref 13.0–17.0)
Immature Granulocytes: 0 %
Lymphocytes Relative: 28 %
Lymphs Abs: 2.2 10*3/uL (ref 0.7–4.0)
MCH: 25.3 pg — ABNORMAL LOW (ref 26.0–34.0)
MCHC: 31.8 g/dL (ref 30.0–36.0)
MCV: 79.6 fL — ABNORMAL LOW (ref 80.0–100.0)
Monocytes Absolute: 0.9 10*3/uL (ref 0.1–1.0)
Monocytes Relative: 11 %
Neutro Abs: 4.2 10*3/uL (ref 1.7–7.7)
Neutrophils Relative %: 55 %
Platelets: 362 10*3/uL (ref 150–400)
RBC: 4.46 MIL/uL (ref 4.22–5.81)
RDW: 23.1 % — ABNORMAL HIGH (ref 11.5–15.5)
WBC: 7.6 10*3/uL (ref 4.0–10.5)
nRBC: 0 % (ref 0.0–0.2)

## 2022-03-26 LAB — COMPREHENSIVE METABOLIC PANEL
ALT: 12 U/L (ref 0–44)
AST: 22 U/L (ref 15–41)
Albumin: 3.1 g/dL — ABNORMAL LOW (ref 3.5–5.0)
Alkaline Phosphatase: 70 U/L (ref 38–126)
Anion gap: 9 (ref 5–15)
BUN: 7 mg/dL — ABNORMAL LOW (ref 8–23)
CO2: 25 mmol/L (ref 22–32)
Calcium: 8.9 mg/dL (ref 8.9–10.3)
Chloride: 97 mmol/L — ABNORMAL LOW (ref 98–111)
Creatinine, Ser: 0.59 mg/dL — ABNORMAL LOW (ref 0.61–1.24)
GFR, Estimated: >60 mL/min (ref >60)
Glucose, Bld: 147 mg/dL — ABNORMAL HIGH (ref 70–99)
Potassium: 3.7 mmol/L (ref 3.5–5.1)
Sodium: 131 mmol/L — ABNORMAL LOW (ref 135–145)
Total Bilirubin: 0.2 mg/dL — ABNORMAL LOW (ref 0.3–1.2)
Total Protein: 6.8 g/dL (ref 6.5–8.1)

## 2022-03-26 MED ORDER — DEXTROSE 5 % IV SOLN
Freq: Once | INTRAVENOUS | Status: AC
  Filled 2022-03-26: qty 250

## 2022-03-26 MED ORDER — SODIUM CHLORIDE 0.9 % IV SOLN
10.0000 mg | Freq: Once | INTRAVENOUS | Status: AC
  Administered 2022-03-26: 10 mg via INTRAVENOUS
  Filled 2022-03-26: qty 10

## 2022-03-26 MED ORDER — HEPARIN SOD (PORK) LOCK FLUSH 100 UNIT/ML IV SOLN
500.0000 [IU] | Freq: Once | INTRAVENOUS | Status: DC
  Filled 2022-03-26: qty 5

## 2022-03-26 MED ORDER — OXALIPLATIN CHEMO INJECTION 100 MG/20ML
85.0000 mg/m2 | Freq: Once | INTRAVENOUS | Status: AC
  Administered 2022-03-26: 145 mg via INTRAVENOUS
  Filled 2022-03-26: qty 20

## 2022-03-26 MED ORDER — OXYCODONE HCL 5 MG PO TABS: 5.0000 mg | ORAL_TABLET | ORAL | 0 refills | Status: DC | PRN

## 2022-03-26 MED ORDER — FLUOROURACIL CHEMO INJECTION 2.5 GM/50ML
400.0000 mg/m2 | Freq: Once | INTRAVENOUS | Status: AC
  Administered 2022-03-26: 700 mg via INTRAVENOUS
  Filled 2022-03-26: qty 14

## 2022-03-26 MED ORDER — SODIUM CHLORIDE 0.9 % IV SOLN
2400.0000 mg/m2 | INTRAVENOUS | Status: DC
  Administered 2022-03-26: 4150 mg via INTRAVENOUS
  Filled 2022-03-26: qty 83

## 2022-03-26 MED ORDER — SODIUM CHLORIDE 0.9% FLUSH
10.0000 mL | Freq: Once | INTRAVENOUS | Status: AC
  Administered 2022-03-26: 10 mL via INTRAVENOUS
  Filled 2022-03-26: qty 10

## 2022-03-26 MED ORDER — PALONOSETRON HCL INJECTION 0.25 MG/5ML
0.2500 mg | Freq: Once | INTRAVENOUS | Status: AC
  Administered 2022-03-26: 0.25 mg via INTRAVENOUS
  Filled 2022-03-26: qty 5

## 2022-03-26 MED ORDER — LEUCOVORIN CALCIUM INJECTION 350 MG
700.0000 mg | Freq: Once | INTRAVENOUS | Status: AC
  Administered 2022-03-26: 700 mg via INTRAVENOUS
  Filled 2022-03-26: qty 35

## 2022-03-26 NOTE — Progress Notes (Signed)
[place]  [phone] [phone]) [phone]  ID: Kyle Larson OB: [birth year]  MR#: [id number]  ZSW#:[id number]  Patient Care Team: [name], MD as PCP - General (Cardiology) Clent Jacks, RN as Oncology Nurse Navigator  CHIEF COMPLAINT: Stage IIa adenocarcinoma of the rectum  INTERVAL HISTORY: Patient returns to clinic today for further evaluation and consideration of cycle 2 of neoadjuvant FOLFOX chemotherapy. He tolerated cycle 1 relatively well. Continues to have mouth pain secondaryt o dental & gum disease. Reports ongoing weight loss secondary to poor oral intake. He also continues to have abdominal pain. He continues to have weakness and fatigue.  He has no neurologic complaints.  He denies any recent fevers or illnesses.  He has no chest pain, shortness of breath, cough, or hemoptysis.  He has no nausea, vomiting, constipation, or diarrhea.  He does not report any melena or hematochezia.  He has no urinary complaints.  Patient offers no further specific complaints today.  REVIEW OF SYSTEMS:   Review of Systems  Constitutional:  Positive for malaise/fatigue. Negative for fever and weight loss.  Respiratory: Negative.  Negative for cough, hemoptysis and shortness of breath.   Cardiovascular: Negative.  Negative for chest pain and leg swelling.  Gastrointestinal: Negative.  Negative for abdominal pain, blood in stool, constipation, diarrhea and melena.  Genitourinary: Negative.  Negative for dysuria.  Musculoskeletal:        Pelvic pain  Skin: Negative.  Negative for rash.  Neurological:  Positive for weakness. Negative for dizziness, speech change, focal weakness and headaches.  Psychiatric/Behavioral: Negative.  The patient is not nervous/anxious.   As per HPI. Otherwise, a complete review of systems is negative.  PAST MEDICAL HISTORY: Past Medical History:  Diagnosis Date   AC joint dislocation    CAD (coronary artery disease)     CVA (cerebral vascular accident) ([place])    Metatarsal bone fracture    3rd metatarsal , left foot; April 2021   Syncope    June '22   Tobacco abuse     PAST SURGICAL HISTORY: Past Surgical History:  Procedure Laterality Date   ACROMIO-CLAVICULAR JOINT REPAIR Left 09/11/2016   Procedure: ACROMIO-CLAVICULAR RECONSTRUCTION;  Surgeon: Corky Mull, MD;  Location: [facility];  Service: Orthopedics;  Laterality: Left;  clavicle   FLEXIBLE SIGMOIDOSCOPY N/A 02/24/2022   Procedure: FLEXIBLE SIGMOIDOSCOPY;  Surgeon: Lesly Rubenstein, MD;  Location: [facility];  Service: Endoscopy;  Laterality: N/A;   IR IMAGING GUIDED PORT INSERTION  03/10/2022   LEFT HEART CATH AND CORONARY ANGIOGRAPHY N/A 05/20/2021   Procedure: LEFT HEART CATH AND CORONARY ANGIOGRAPHY with coronary intervention;  Surgeon: [name], MD;  Location: [facility];  Service: Cardiovascular;  Laterality: N/A;   MANDIBLE RECONSTRUCTION     TONSILLECTOMY     AGE 66   XI ROBOTIC ASSISTED INGUINAL HERNIA REPAIR WITH MESH Right 03/26/2020   Procedure: XI ROBOTIC ASSISTED INGUINAL HERNIA REPAIR WITH MESH;  Surgeon: Herbert Pun, MD;  Location: [facility] OR[facility];  Service: General;  Laterality: Right;    FAMILY HISTORY: Family History  Problem Relation Age of Onset   Diabetes Mother    Prostate cancer Father     ADVANCED DIRECTIVES (Y/N):  N  HEALTH MAINTENANCE: Social History   Tobacco Use   Smoking status: Some Days    Packs/day: 0.25    Years: 15.00    Pack years: 3.75    Types: Cigarettes    Passive exposure: Current   Smokeless tobacco:  Never  Vaping Use   Vaping Use: Never used  Substance Use Topics   Alcohol use: Yes    Alcohol/week: 42.0 standard drinks    Types: 42 Cans of beer per week    Comment: 3 every other day   Drug use: No    Colonoscopy:  PAP:  Bone density:  Lipid panel:  No Known Allergies  Current Outpatient Medications  Medication Sig Dispense Refill   acetaminophen  (TYLENOL) 325 MG tablet Take 2 tablets (650 mg total) by mouth every 6 (six) hours as needed for mild pain.     atorvastatin (LIPITOR) 40 MG tablet Take 1 tablet (40 mg total) by mouth daily. 30 tablet 0   cephALEXin (KEFLEX) 125 MG/5ML suspension Take 5 mLs (125 mg total) by mouth 4 (four) times daily for 7 days. 140 mL 0   feeding supplement (ENSURE ENLIVE / ENSURE PLUS) LIQD Take 237 mLs by mouth 3 (three) times daily between meals. 21330 mL 0   fentaNYL (DURAGESIC) 25 MCG/HR Place 1 patch onto the skin every 3 (three) days. 10 patch 0   ferrous sulfate 325 (65 FE) MG tablet Take 1 tablet (325 mg total) by mouth daily. 30 tablet 0   folic acid (FOLVITE) 1 MG tablet Take 1 tablet (1 mg total) by mouth daily. 30 tablet 0   lidocaine-prilocaine (EMLA) cream Apply to affected area once 30 g 3   Multiple Vitamins-Minerals (MULTIVITAMIN WITH MINERALS) tablet Take 1 tablet by mouth daily.     nicotine (NICODERM CQ - DOSED IN MG/24 HOURS) 21 mg/24hr patch Place 21 mg onto the skin daily.     omeprazole (PRILOSEC) 20 MG capsule Take 1 capsule (20 mg total) by mouth daily. 30 capsule 2   ondansetron (ZOFRAN) 8 MG tablet TAKE 1 TABLET 2 TIMES DAILY AS NEEDED FOR REFRACTORY NAUSEA / VOMITING. START DAY 3 AFTER CHEMO 60 tablet 2   oxyCODONE (OXY IR/ROXICODONE) 5 MG immediate release tablet Take 1 tablet (5 mg total) by mouth every 4 (four) hours as needed for severe pain. 60 tablet 0   prochlorperazine (COMPAZINE) 10 MG tablet Take 1 tablet (10 mg total) by mouth every 6 (six) hours as needed (Nausea or vomiting). 60 tablet 2   tamsulosin (FLOMAX) 0.4 MG CAPS capsule Take 1 capsule (0.4 mg total) by mouth daily after supper. 30 capsule 0   thiamine 100 MG tablet Take 1 tablet (100 mg total) by mouth daily. 30 tablet 0   Oxycodone HCl 10 MG TABS Take 1 tablet (10 mg total) by mouth every 4 (four) hours as needed. (Patient not taking: Reported on 03/20/2022) 60 tablet 0   No current facility-administered  medications for this visit.   Facility-Administered Medications Ordered in Other Visits  Medication Dose Route Frequency Provider Last Rate Last Admin   heparin lock flush 100 unit/mL  500 Units Intravenous Once Lloyd Huger, MD        OBJECTIVE: Vitals:   03/26/22 0833  BP: 104/72  Pulse: 88  Resp: 18  Temp: 97.6 F (36.4 C)  SpO2: 99%     Body mass index is 16.89 kg/m.    ECOG FS:1 - Symptomatic but completely ambulatory  General: Well-developed, well-nourished, no acute distress. Eyes: Pink conjunctiva, anicteric sclera. HEENT: Normocephalic, moist mucous membranes.  Poor dentition. Lungs: No audible wheezing or coughing. Heart: Regular rate and rhythm. Abdomen: Soft, nontender, no obvious distention. Musculoskeletal: No edema, cyanosis, or clubbing. Neuro: Alert, answering all questions appropriately. Cranial nerves  grossly intact. Skin: No rashes or petechiae noted. Psych: Normal affect.   LAB RESULTS:  Lab Results  Component Value Date   NA 131 (L) 03/26/2022   K 3.7 03/26/2022   CL 97 (L) 03/26/2022   CO2 25 03/26/2022   GLUCOSE 147 (H) 03/26/2022   BUN 7 (L) 03/26/2022   CREATININE 0.59 (L) 03/26/2022   CALCIUM 8.9 03/26/2022   PROT 6.8 03/26/2022   ALBUMIN 3.1 (L) 03/26/2022   AST 22 03/26/2022   ALT 12 03/26/2022   ALKPHOS 70 03/26/2022   BILITOT 0.2 (L) 03/26/2022   GFRNONAA >60 03/26/2022   GFRAA >60 10/03/2016    Lab Results  Component Value Date   WBC 7.6 03/26/2022   NEUTROABS 4.2 03/26/2022   HGB 11.3 (L) 03/26/2022   HCT 35.5 (L) 03/26/2022   MCV 79.6 (L) 03/26/2022   PLT 362 03/26/2022     STUDIES: CT CHEST W CONTRAST  Result Date: 02/25/2022 CLINICAL DATA:  Metastatic colon cancer, staging. * Tracking Code: BO * EXAM: CT CHEST WITH CONTRAST TECHNIQUE: Multidetector CT imaging of the chest was performed during intravenous contrast administration. RADIATION DOSE REDUCTION: This exam was performed according to the departmental  dose-optimization program which includes automated exposure control, adjustment of the mA and/or kV according to patient size and/or use of iterative reconstruction technique. CONTRAST:  72m OMNIPAQUE IOHEXOL 300 MG/ML  SOLN COMPARISON:  Abdominopelvic CT 02/22/2022.  Chest CTA 05/19/2021. FINDINGS: Cardiovascular: Multi-vessel coronary artery atherosclerosis with lesser involvement of the aorta and great vessels. No acute vascular findings are identified. The heart size is normal. There is no pericardial effusion. Mediastinum/Nodes: Small hilar lymph nodes bilaterally are similar to the previous CT, likely reactive. No enlarged mediastinal or axillary lymph nodes demonstrated. The thyroid gland, trachea and esophagus demonstrate no significant findings. Lungs/Pleura: There are new small bilateral pleural effusions with associated bibasilar atelectasis compared with the abdominal CT performed 3 days ago. Underlying mild centrilobular emphysema with new diffuse central airway thickening and ill-defined peribronchial opacities which may reflect edema or early inflammation. No confluent airspace opacity or highly suspicious pulmonary nodularity. Upper abdomen: No acute or significant findings are seen within the visualized upper abdomen. Musculoskeletal/Chest wall: New soft tissue emphysema within the right lateral chest wall, of uncertain etiology. No focal fluid collections are identified. There are nonacute incompletely healed rib fractures on the left, unchanged from previous CT. No acute or suspicious osseous findings. Mild degenerative changes in the thoracic spine. IMPRESSION: 1. Compared with the abdominal CT of 3 days ago, there are new bilateral pleural effusions with bibasilar atelectasis, central airway thickening and patchy pulmonary opacities, likely reflecting edema or atypical inflammation. Recommend chest radiographic follow-up. 2. Assessment for metastatic disease limited by the acute findings  described above. However, given those limitations, no findings highly suspicious for thoracic metastatic disease. Mildly prominent hilar lymph nodes bilaterally are likely reactive, similar to previous chest CT. 3. Soft tissue emphysema within the right lateral chest wall which may be postsurgical. Correlate clinically. No focal fluid collection, acute rib injury or pneumothorax. 4. Coronary and aortic atherosclerosis (ICD10-I70.0). Emphysema (ICD10-J43.9). Electronically Signed   By: WRichardean SaleM.D.   On: 02/25/2022 09:12   IR IMAGING GUIDED PORT INSERTION  Result Date: 03/10/2022 INDICATION: colon cancer EXAM: Ultrasound-guided puncture of the right internal jugular vein Placement of a right-sided chest port using fluoroscopic guidance MEDICATIONS: Fentanyl 75 mcg ANESTHESIA/SEDATION: Local analgesia FLUOROSCOPY TIME:  Fluoroscopy Time: 0.8 minutes (3 mGy) COMPLICATIONS: None immediate. PROCEDURE: Informed written  consent was obtained from the patient after a thorough discussion of the procedural risks, benefits and alternatives. All questions were addressed. Maximal Sterile Barrier Technique was utilized including caps, mask, sterile gowns, sterile gloves, sterile drape, hand hygiene and skin antiseptic. A timeout was performed prior to the initiation of the procedure. The patient was placed supine on the exam table. The right neck and chest was prepped and draped in the standard sterile fashion. A preliminary ultrasound of the right neck was performed and demonstrates a patent right internal jugular vein. A permanent ultrasound image was stored in the electronic medical record. The overlying skin was anesthetized with 1% Lidocaine. Using ultrasound guidance, access was obtained into the right internal jugular vein using a 21 gauge micropuncture set. A wire was advanced into the SVC, a short incision was made at the puncture site, and serial dilatation performed. Next, in an ipsilateral infraclavicular  location, an incision was made at the site of the subcutaneous reservoir. Blunt dissection was used to open a pocket to contain the reservoir. A subcutaneous tunnel was then created from the port site to the puncture site. A(n) 8 Fr single lumen catheter was advanced through the tunnel. The catheter was attached to the port and this was placed in the subcutaneous pocket. Under fluoroscopic guidance, a peel away sheath was placed, and the catheter was trimmed to the appropriate length and was advanced into the central veins. The catheter length is 26 cm. The tip of the catheter lies near the superior cavoatrial junction. The port flushes and aspirates appropriately. The port was flushed and locked with heparinized saline. The port pocket was closed in 2 layers using 3-0 and 4-0 Vicryl/absorbable suture. Dermabond was also applied to both incisions. The patient tolerated the procedure well and was transferred to recovery in stable condition. IMPRESSION: Successful placement of a right-sided chest port via the right internal jugular vein. Port is ready for immediate use. Electronically Signed   By: Albin Felling M.D.   On: 03/10/2022 11:42   MR PELVIS WO CM RECTAL CA STAGING  Result Date: 03/07/2022 CLINICAL DATA:  66 year old male with incidental rectal mass on emergency room CT scan. CT exam performed for abdominal pain. EXAM: MRI PELVIS WITHOUT CONTRAST TECHNIQUE: Multiplanar multisequence MR imaging of the pelvis was performed. No intravenous contrast was administered. Ultrasound gel was administered per rectum to optimize tumor evaluation. COMPARISON:  CT 02/25/2022, 02/22/2022 FINDINGS: TUMOR LOCATION: Large pedunculated exophytic fungating mass extending from the posterior LEFT wall of the distal rectum. Exophytic portion of the mass is extremely large measuring 11.9 by 8.3 by 7.5 cm. Tumor distance from Anal Verge/Skin Surface:  4.7 cm Tumor distance to Internal Anal Sphincter: 2.5 cm TUMOR DESCRIPTION  Circumferential Extent: Mass appears to originate from the 5 o'clock position of the posterior LEFT rectal wall but is near circumferential in extent extending from approximately 10 o'clock through 8 o'clock again largely involving the LEFT rectal wall. Tumor Length: Greatest length equals 11.4 cm cm T - CATEGORY Extension through Muscularis Propria: Exophytic portion the mass measures up to 5 cm in thickness (image 24/3). T 3D > than 15 mm. Presumption that the exophytic mass extends through the muscularis propria although the mass does appear confined/capsulated. Shortest Distance of any tumor/node from Mesorectal Fascia: 0 mm Extramural Vascular Invasion/Tumor Thrombus: No Invasion of Anterior Peritoneal Reflection: No Involvement of Adjacent Organs or Pelvic Sidewall: No Levator Ani Involvement: No N - CATEGORY Mesorectal Lymph Nodes >=13m: None Extra-mesorectal Lymphadenopathy: None Other:  None. IMPRESSION: Rectal adenocarcinoma T stage: T3 D. Presumption that the large exophytic fungating mass extends to the muscular propria although this not entirely clear as the mass does appear capsulated. Rectal adenocarcinoma N stage:  No Distance from tumor to the internal anal sphincter is 2.5 cm. Electronically Signed   By: Suzy Bouchard M.D.   On: 03/07/2022 10:14    ASSESSMENT: Stage IIa adenocarcinoma of the rectum.  PLAN:    Stage IIa adenocarcinoma of the rectum: CT scan results of chest, abdomen, and pelvis reviewed independently and it does not appear patient has metastatic disease.  MRI of the pelvis completed on March 03, 2022 confirmed stage of disease.  CEA only mildly elevated at 6.7.  Patient has undergone diverting colostomy. Neoadjuvant chemotherapy with FOLFOX for 8 cycles was recommended followed by concurrent Xeloda and XRT. He would then require definitive surgery to remove any residual disease. Unclear if colostomy is reversible. Patient tolerated cycle 1 of 8 of neoadjuvant FOLFOX last  week relatively well.  Return to clinic in 2 weeks for further evaluation and consideration of cycle 3.   Mouth Pain: Improved with 25 mcg fentanyl patch every 72 hours as well as 10 mg oxycodone every 4-6 hours as needed.  Appreciate palliative care input. Anemia: Hemoglobin mildly improved to 11.3. Monitor. Thrombocytosis: Resolved. Leukocytosis: Resolved. 6.  Hyponatremia: Chronic and unchanged.  Patient's sodium is 131 today. 7.  Mouth pain/poor dentition: s/p augmentin. Recommend he see a dentist.  8. Port-a-cath: placed for administration of chemotherapy. Functioning well.   Disposition: Proceed with cycle 2 of neoadjuvant FOLFOX chemotherapy today 2 weeks- port/lab (cbc, cmp), [place], FOLFOX #3- [place]  Patient expressed understanding and was in agreement with this plan. He also understands that He can call clinic at any time with any questions, concerns, or complaints.    Cancer Staging  Adenocarcinoma of rectum [facility]) Staging form: Colon and Rectum, AJCC 8th Edition - Clinical stage from 03/10/2022: Stage IIA (cT3, cN0, cM0) - Signed by Lloyd Huger, MD on 03/10/2022 Stage prefix: Initial diagnosis Total positive nodes: 0  Verlon Au, NP   03/26/2022

## 2022-03-26 NOTE — Patient Instructions (Signed)
[facility] AT [place]  Discharge Instructions: ?Thank you for choosing [place] to provide your oncology and hematology care.  ?If you have a lab appointment with the , please go directly to the [facility] and check in at the registration area. ? ?Wear comfortable clothing and clothing appropriate for easy access to any Portacath or PICC line.  ? ?We strive to give you quality time with your provider. You may need to reschedule your appointment if you arrive late (15 or more minutes).  Arriving late affects you and other patients whose appointments are after yours.  Also, if you miss three or more appointments without notifying the office, you may be dismissed from the clinic at the provider?s discretion.    ?  ?For prescription refill requests, have your pharmacy contact our office and allow 72 hours for refills to be completed.   ? ?Today you received the following chemotherapy and/or immunotherapy agents: Oxaliplatin, leucovorin, Adrucil    ?  ?To help prevent nausea and vomiting after your treatment, we encourage you to take your nausea medication as directed. ? ?BELOW ARE SYMPTOMS THAT SHOULD BE REPORTED IMMEDIATELY: ?*FEVER GREATER THAN 100.4 F (38 ?C) OR HIGHER ?*CHILLS OR SWEATING ?*NAUSEA AND VOMITING THAT IS NOT CONTROLLED WITH YOUR NAUSEA MEDICATION ?*UNUSUAL SHORTNESS OF BREATH ?*UNUSUAL BRUISING OR BLEEDING ?*URINARY PROBLEMS (pain or burning when urinating, or frequent urination) ?*BOWEL PROBLEMS (unusual diarrhea, constipation, pain near the anus) ?TENDERNESS IN MOUTH AND THROAT WITH OR WITHOUT PRESENCE OF ULCERS (sore throat, sores in mouth, or a toothache) ?UNUSUAL RASH, SWELLING OR PAIN  ?UNUSUAL VAGINAL DISCHARGE OR ITCHING  ? ?Items with * indicate a potential emergency and should be followed up as soon as possible or go to the Emergency Department if any problems should occur. ? ?Please show the CHEMOTHERAPY ALERT CARD or IMMUNOTHERAPY  ALERT CARD at check-in to the Emergency Department and triage nurse. ? ?Should you have questions after your visit or need to cancel or reschedule your appointment, please contact [facility] [place] [facility] [place]  [phone] and follow the prompts.  Office hours are 8:00 a.m. to 4:30 p.m. Monday - Friday. Please note that voicemails left after 4:00 p.m. may not be returned until the following business day.  We are closed weekends and major holidays. You have access to a nurse at all times for urgent questions. Please call the main number to the clinic [phone] and follow the prompts. ? ?For any non-urgent questions, you may also contact your provider using MyChart. We now offer e-Visits for anyone 69 and older to request care online for non-urgent symptoms. For details visit [email or web address]. ?  ?Also download the MyChart app! Go to the app store, search "MyChart", open the app, select [place], and log in with your MyChart username and password. ? ?Due to Covid, a mask is required upon entering the hospital/clinic. If you do not have a mask, one will be given to you upon arrival. For doctor visits, patients may have 1 support person aged 71 or older with them. For treatment visits, patients cannot have anyone with them due to current Covid guidelines and our immunocompromised population.  ?

## 2022-03-27 ENCOUNTER — Telehealth: Payer: Self-pay | Admitting: *Deleted

## 2022-03-27 NOTE — Telephone Encounter (Signed)
Patient called and left a message that yesterday he got a pump on, he went to the refrigerator and got out of drink and felt like pinecone sticking him in his hands.  I let him know that one of the medications that he is taking for his chemo which is oxaliplatin, can make very cold sensitivity.  I advised him to drink room temperature liquids.  Anything cold like the refrigerator freezer you need to wear gloves so that would prevent you from getting that stinging prickly pain.  I asked him if he remembered that from the chemo education class.  He said he did remember it but he was supposed to call and if he has those feelings.  I told him to make sure he keeps globs around the refrigerator and its best if he drinks everything room temperature.  He thanked me for the call ?

## 2022-03-28 ENCOUNTER — Inpatient Hospital Stay: Payer: Medicare Other

## 2022-03-28 ENCOUNTER — Telehealth: Payer: Self-pay | Admitting: *Deleted

## 2022-03-28 DIAGNOSIS — C20 Malignant neoplasm of rectum: Secondary | ICD-10-CM

## 2022-03-28 DIAGNOSIS — Z5111 Encounter for antineoplastic chemotherapy: Secondary | ICD-10-CM | POA: Diagnosis not present

## 2022-03-28 MED ORDER — SODIUM CHLORIDE 0.9% FLUSH
10.0000 mL | INTRAVENOUS | Status: DC | PRN
  Administered 2022-03-28: 10 mL
  Filled 2022-03-28: qty 10

## 2022-03-28 MED ORDER — HEPARIN SOD (PORK) LOCK FLUSH 100 UNIT/ML IV SOLN
500.0000 [IU] | Freq: Once | INTRAVENOUS | Status: AC | PRN
  Administered 2022-03-28: 500 [IU]
  Filled 2022-03-28: qty 5

## 2022-03-28 NOTE — Progress Notes (Signed)
Nutrition Assessment ? ? ?Reason for Assessment:  ?Referral from [place], NP ? ? ?ASSESSMENT:  ?66 year old male with stage II adenocarcinoma of rectum.  Past medical history of CAD, CVA, etoh use.  Patient receiving neoadjuvant folfox.   ? ?Met with patient and significant other.  Patient reports that appetite recently has been good.  "I have been eating good ever since I got the antibiotic for my tooth abscess."  Reports prior to appetite was decreased due to pain with eating.  Says that he is eating 3 meals a day.  Eggs, spaghetti, beefaroni, peanut butter sandwiches, chicken salad sandwiches, pimento cheese sandwiches.  Drinks oral nutrition supplements shakes sometimes at 1-2am.  Says that he has not had a bowel movement in the last 3-4 days.  Took ducolax and had a small bowel movement this am.  Starting to concern him that he has not had a bowel movement.  Says that it feels like touching a pinecone when he touches something cold or drinks anything cold.   ? ? ?Medications: MVI, prilosec, zofran, compazine, folic acid, thiamine ? ? ?Labs: Na 131, glucose 147 ? ? ?Anthropometrics:  ? ?Height: 72 inches ?Weight: 124 lb 8 oz ?UBW: 155-160 lb about 1 year ago ?Says started having stomach pains about 1 year ago ?BMI: 16 ? ?20% weight loss in the last year, significant ? ?Estimated Energy Needs ? ?Kcals: 1680-1900 ?Protein: 84-95 g ?Fluid: 1680-1922m ? ? ?NUTRITION DIAGNOSIS: Inadequate oral intake related to cancer (abdominal pain) and tooth abscess as evidenced by 20% weight loss in the last year and poor po intake ? ? ?INTERVENTION:  ?Discussed ways to add calories and protein in diet.  Handout provided ?Encouraged 350 calorie shakes.  Complimentary case of ensure plus given to patient today.   ?Reviewed importance of avoiding cold items due to chemotherapy.   ?Encouraged MVI, folate and thiamine with etoh use and BMI 16 ?Spoke with RN, SJudeen Hammansregarding concern for lack of bowel movement.  ?Contact information  given  ? ?MONITORING, EVALUATION, GOAL: weight trends, intake ? ? ?Next Visit: Wednesday, May 17 during infusion ? ?Deshayla Empson B. AZenia Resides RD, LDN ?Registered Dietitian ?[phone]? ? ? ? ? ?

## 2022-03-28 NOTE — Telephone Encounter (Signed)
Kyle Larson saw this pt today for nutrition. He took dulcolax and he still did not have BM, has not had one in 3 days. I asked if the dulcolax was laxative or stool softner. He is not sure. I told him to take stool softner in am and at bedtime  and then today he can get miralax and take a dose today. He can take  up to 3  time a day but if he has diarrhea he should hold miralax until it get back to soft stool. Pt agreeable to this ?

## 2022-04-02 ENCOUNTER — Inpatient Hospital Stay (HOSPITAL_BASED_OUTPATIENT_CLINIC_OR_DEPARTMENT_OTHER): Payer: Medicare Other | Admitting: Hospice and Palliative Medicine

## 2022-04-02 ENCOUNTER — Encounter: Payer: Self-pay | Admitting: Oncology

## 2022-04-02 DIAGNOSIS — C20 Malignant neoplasm of rectum: Secondary | ICD-10-CM | POA: Diagnosis not present

## 2022-04-02 NOTE — Progress Notes (Signed)
Virtual Visit via Telephone Note ? ?I connected with Kyle Larson on 04/02/22 at 11:30 AM EDT by telephone and verified that I am speaking with the correct person using two identifiers. ? ?Location: ?Patient: Telephone ?Provider: Home ?  ?I discussed the limitations, risks, security and privacy concerns of performing an evaluation and management service by telephone and the availability of in person appointments. I also discussed with the patient that there may be a patient responsible charge related to this service. The patient expressed understanding and agreed to proceed. ? ? ?History of Present Illness: ?Kyle Larson is a 66 y.o. 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  Patient has been symptomatic with pain and poor appetite.  He is referred to palliative care to help address goals and manage ongoing symptoms. ?  ?Observations/Objective: ?I called and spoke with patient by phone.  He reports that he is doing reasonably well other than being constipated.  He says his last bowel movement was few days ago but that he has had gassy pain and has been taking Ex-Lax without success.  I suggested that he add MiraLAX once or twice a day.  He verbalized agreement.  No other symptomatic concerns. ? ?Assessment and Plan: ?Constipation -recommend MiraLAX/senna ? ?Follow Up Instructions: ?RTC next week to see Dr. Grayland Ormond.  Follow-up telephone visit 1 month ?  ?I discussed the assessment and treatment plan with the patient. The patient was provided an opportunity to ask questions and all were answered. The patient agreed with the plan and demonstrated an understanding of the instructions. ?  ?The patient was advised to call back or seek an in-person evaluation if the symptoms worsen or if the condition fails to improve as  anticipated. ? ?I provided 5 minutes of non-face-to-face time during this encounter. ? ? ?Irean Hong, NP ? ? ?

## 2022-04-06 NOTE — Progress Notes (Signed)
[facility]  Telephone:(336) [phone] Fax:(336) [phone]  ID: [name] OB: 08-29-1956  MR#: [id number]  WGY#:[id number]  Patient Care Team: [name], MD as PCP - General (Cardiology) Clent Jacks, RN as Oncology Nurse Navigator Grayland Ormond, Kathlene November, MD as Consulting Physician (Oncology)  CHIEF COMPLAINT: Stage IIa adenocarcinoma of the rectum.  INTERVAL HISTORY: Patient returns to clinic today for further evaluation and consideration of cycle 3 of neoadjuvant FOLFOX.  His mouth pain has significantly improved since initiating antibiotics.  He is tolerating his treatments relatively well.  He continues to have abdominal pain, but this is improved as well.  He continues to have weakness and fatigue.  He has no neurologic complaints.  He denies any recent fevers or illnesses.  He has no chest pain, shortness of breath, cough, or hemoptysis.  He has no nausea, vomiting, constipation, or diarrhea.  He does not report any melena or hematochezia.  He has no urinary complaints.  Patient offers no further specific complaints today.  REVIEW OF SYSTEMS:   Review of Systems  Constitutional:  Positive for malaise/fatigue. Negative for fever and weight loss.  Respiratory: Negative.  Negative for cough, hemoptysis and shortness of breath.   Cardiovascular: Negative.  Negative for chest pain and leg swelling.  Gastrointestinal: Negative.  Negative for abdominal pain, blood in stool, constipation, diarrhea and melena.  Genitourinary: Negative.  Negative for dysuria.  Musculoskeletal:        Pelvic pain  Skin: Negative.  Negative for rash.  Neurological:  Positive for weakness. Negative for dizziness, speech change, focal weakness and headaches.  Psychiatric/Behavioral: Negative.  The patient is not nervous/anxious.    As per HPI. Otherwise, a complete review of systems is negative.  PAST MEDICAL HISTORY: Past Medical History:  Diagnosis Date   AC joint dislocation     CAD (coronary artery disease)    CVA (cerebral vascular accident) ([place])    Metatarsal bone fracture    3rd metatarsal , left foot; April 2021   Syncope    June '22   Tobacco abuse     PAST SURGICAL HISTORY: Past Surgical History:  Procedure Laterality Date   ACROMIO-CLAVICULAR JOINT REPAIR Left 09/11/2016   Procedure: ACROMIO-CLAVICULAR RECONSTRUCTION;  Surgeon: Corky Mull, MD;  Location: [facility] ORS;  Service: Orthopedics;  Laterality: Left;  clavicle   FLEXIBLE SIGMOIDOSCOPY N/A 02/24/2022   Procedure: FLEXIBLE SIGMOIDOSCOPY;  Surgeon: Lesly Rubenstein, MD;  Location: [facility] ENDOSCOPY;  Service: Endoscopy;  Laterality: N/A;   IR IMAGING GUIDED PORT INSERTION  03/10/2022   LEFT HEART CATH AND CORONARY ANGIOGRAPHY N/A 05/20/2021   Procedure: LEFT HEART CATH AND CORONARY ANGIOGRAPHY with coronary intervention;  Surgeon: [name], MD;  Location: [facility];  Service: Cardiovascular;  Laterality: N/A;   MANDIBLE RECONSTRUCTION     TONSILLECTOMY     AGE 66   XI ROBOTIC ASSISTED INGUINAL HERNIA REPAIR WITH MESH Right 03/26/2020   Procedure: XI ROBOTIC ASSISTED INGUINAL HERNIA REPAIR WITH MESH;  Surgeon: Herbert Pun, MD;  Location: [facility] ORS;  Service: General;  Laterality: Right;    FAMILY HISTORY: Family History  Problem Relation Age of Onset   Diabetes Mother    Prostate cancer Father     ADVANCED DIRECTIVES (Y/N):  N  HEALTH MAINTENANCE: Social History   Tobacco Use   Smoking status: Some Days    Packs/day: 0.25    Years: 15.00    Pack years: 3.75    Types: Cigarettes  Passive exposure: Current   Smokeless tobacco: Never  Vaping Use   Vaping Use: Never used  Substance Use Topics   Alcohol use: Yes    Alcohol/week: 42.0 standard drinks    Types: 42 Cans of beer per week    Comment: 3 every other day   Drug use: No     Colonoscopy:  PAP:  Bone density:  Lipid panel:  No Known Allergies  Current Outpatient Medications  Medication Sig  Dispense Refill   acetaminophen (TYLENOL) 325 MG tablet Take 2 tablets (650 mg total) by mouth every 6 (six) hours as needed for mild pain.     atorvastatin (LIPITOR) 40 MG tablet Take 1 tablet (40 mg total) by mouth daily. 30 tablet 0   feeding supplement (ENSURE ENLIVE / ENSURE PLUS) LIQD Take 237 mLs by mouth 3 (three) times daily between meals. 21330 mL 0   fentaNYL (DURAGESIC) 25 MCG/HR Place 1 patch onto the skin every 3 (three) days. 10 patch 0   folic acid (FOLVITE) 1 MG tablet Take 1 tablet (1 mg total) by mouth daily. 30 tablet 0   lidocaine-prilocaine (EMLA) cream Apply to affected area once 30 g 3   Multiple Vitamins-Minerals (MULTIVITAMIN WITH MINERALS) tablet Take 1 tablet by mouth daily.     nicotine (NICODERM CQ - DOSED IN MG/24 HOURS) 21 mg/24hr patch Place 21 mg onto the skin daily.     omeprazole (PRILOSEC) 20 MG capsule Take 1 capsule (20 mg total) by mouth daily. 30 capsule 2   ondansetron (ZOFRAN) 8 MG tablet TAKE 1 TABLET 2 TIMES DAILY AS NEEDED FOR REFRACTORY NAUSEA / VOMITING. START DAY 3 AFTER CHEMO 60 tablet 2   polyethylene glycol (MIRALAX / GLYCOLAX) 17 g packet Take 17 g by mouth daily.     prochlorperazine (COMPAZINE) 10 MG tablet Take 1 tablet (10 mg total) by mouth every 6 (six) hours as needed (Nausea or vomiting). 60 tablet 2   sotalol (BETAPACE) 80 MG tablet Take 80 mg by mouth daily.     tamsulosin (FLOMAX) 0.4 MG CAPS capsule Take 1 capsule (0.4 mg total) by mouth daily after supper. 30 capsule 0   thiamine 100 MG tablet Take 1 tablet (100 mg total) by mouth daily. 30 tablet 0   ferrous sulfate 325 (65 FE) MG tablet Take 1 tablet (325 mg total) by mouth daily. 30 tablet 0   oxyCODONE (OXY IR/ROXICODONE) 5 MG immediate release tablet Take 1 tablet (5 mg total) by mouth every 4 (four) hours as needed for severe pain. 60 tablet 0   No current facility-administered medications for this visit.   Facility-Administered Medications Ordered in Other Visits   Medication Dose Route Frequency Provider Last Rate Last Admin   sodium chloride flush (NS) 0.9 % injection 10 mL  10 mL Intracatheter PRN Lloyd Huger, MD   10 mL at 04/11/22 1336    OBJECTIVE: Vitals:   04/09/22 0933  BP: 126/84  Pulse: 94  Resp: 18  Temp: 98.3 F (36.8 C)     Body mass index is 17.01 kg/m.    ECOG FS:1 - Symptomatic but completely ambulatory  General: Well-developed, well-nourished, no acute distress. Eyes: Pink conjunctiva, anicteric sclera. HEENT: Normocephalic, moist mucous membranes. Lungs: No audible wheezing or coughing. Heart: Regular rate and rhythm. Abdomen: Soft, nontender, no obvious distention. Musculoskeletal: No edema, cyanosis, or clubbing. Neuro: Alert, answering all questions appropriately. Cranial nerves grossly intact. Skin: No rashes or petechiae noted. Psych: Normal affect.  LAB RESULTS:  Lab Results  Component Value Date   NA 129 (L) 04/09/2022   K 3.7 04/09/2022   CL 101 04/09/2022   CO2 22 04/09/2022   GLUCOSE 113 (H) 04/09/2022   BUN 6 (L) 04/09/2022   CREATININE 0.54 (L) 04/09/2022   CALCIUM 8.3 (L) 04/09/2022   PROT 6.9 04/09/2022   ALBUMIN 3.3 (L) 04/09/2022   AST 23 04/09/2022   ALT 11 04/09/2022   ALKPHOS 64 04/09/2022   BILITOT 0.6 04/09/2022   GFRNONAA >60 04/09/2022   GFRAA >60 10/03/2016    Lab Results  Component Value Date   WBC 6.2 04/09/2022   NEUTROABS 2.8 04/09/2022   HGB 11.4 (L) 04/09/2022   HCT 35.4 (L) 04/09/2022   MCV 81.4 04/09/2022   PLT 240 04/09/2022     STUDIES: No results found.  ASSESSMENT: Stage IIa adenocarcinoma of the rectum.  PLAN:    Stage IIa adenocarcinoma of the rectum: CT scan results of chest, abdomen, and pelvis reviewed independently and it does not appear patient has metastatic disease.  MRI of the pelvis completed on March 03, 2022 confirmed stage of disease.  CEA only mildly elevated at 6.7.  Patient has undergone diverting colostomy.  He would likely  benefit from neoadjuvant chemotherapy using 8 cycles of FOLFOX followed by concurrent chemotherapy using Xeloda and XRT.  Patient will then require definitive surgery to remove any residual disease.  It is unclear if his colostomy is reversible at this time.  He has had port placed.  Proceed with cycle 3 of neoadjuvant FOLFOX today.  Return to clinic in 2 days for pump removal and then in 2 weeks for further evaluation and consideration of cycle 4.     Pain: Improved.  Continue 25 mcg fentanyl patch every 72 hours as well as 10 mg oxycodone every 4-6 hours as needed.  Appreciate palliative care input. Anemia: Chronic and unchanged.  Patient's hemoglobin is 11.4 today.   Thrombocytosis: Resolved. Hyponatremia: Chronic and unchanged.  Patient's sodium is 129 today.   Mouth pain/poor dentition: Improved with antibiotics.  Patient understands he will require a dental appointment as soon as treatments are completed.   Patient expressed understanding and was in agreement with this plan. He also understands that He can call clinic at any time with any questions, concerns, or complaints.    Cancer Staging  Adenocarcinoma of rectum [facility]) Staging form: Colon and Rectum, AJCC 8th Edition - Clinical stage from 03/10/2022: Stage IIA (cT3, cN0, cM0) - Signed by Lloyd Huger, MD on 03/10/2022 Stage prefix: Initial diagnosis Total positive nodes: 0  Lloyd Huger, MD   04/11/2022 1:51 PM

## 2022-04-07 ENCOUNTER — Ambulatory Visit: Payer: Medicare Other | Admitting: Urology

## 2022-04-07 ENCOUNTER — Ambulatory Visit (INDEPENDENT_AMBULATORY_CARE_PROVIDER_SITE_OTHER): Payer: Medicare Other | Admitting: Physician Assistant

## 2022-04-07 ENCOUNTER — Encounter: Payer: Self-pay | Admitting: Physician Assistant

## 2022-04-07 VITALS — BP 128/80 | HR 108 | Ht 72.0 in | Wt 123.7 lb

## 2022-04-07 DIAGNOSIS — R339 Retention of urine, unspecified: Secondary | ICD-10-CM

## 2022-04-07 NOTE — Progress Notes (Signed)
Patient presented to clinic today for monthly catheter change, however he states that he wishes to pursue a voiding trial at this time.  I explained that I do not recommend proceeding with a voiding trial in the afternoon, and I would rather rebook him later this week for an a.m. and p.m. appointment.  He is in agreement with this plan.  Appointments are booked. ?

## 2022-04-09 ENCOUNTER — Inpatient Hospital Stay: Payer: Medicare Other

## 2022-04-09 ENCOUNTER — Inpatient Hospital Stay (HOSPITAL_BASED_OUTPATIENT_CLINIC_OR_DEPARTMENT_OTHER): Payer: Medicare Other | Admitting: Oncology

## 2022-04-09 ENCOUNTER — Other Ambulatory Visit: Payer: Self-pay | Admitting: *Deleted

## 2022-04-09 VITALS — BP 126/84 | HR 94 | Temp 98.3°F | Resp 18 | Wt 125.4 lb

## 2022-04-09 DIAGNOSIS — C20 Malignant neoplasm of rectum: Secondary | ICD-10-CM

## 2022-04-09 DIAGNOSIS — Z5111 Encounter for antineoplastic chemotherapy: Secondary | ICD-10-CM | POA: Diagnosis not present

## 2022-04-09 LAB — COMPREHENSIVE METABOLIC PANEL
ALT: 11 U/L (ref 0–44)
AST: 23 U/L (ref 15–41)
Albumin: 3.3 g/dL — ABNORMAL LOW (ref 3.5–5.0)
Alkaline Phosphatase: 64 U/L (ref 38–126)
Anion gap: 6 (ref 5–15)
BUN: 6 mg/dL — ABNORMAL LOW (ref 8–23)
CO2: 22 mmol/L (ref 22–32)
Calcium: 8.3 mg/dL — ABNORMAL LOW (ref 8.9–10.3)
Chloride: 101 mmol/L (ref 98–111)
Creatinine, Ser: 0.54 mg/dL — ABNORMAL LOW (ref 0.61–1.24)
GFR, Estimated: >60 mL/min (ref >60)
Glucose, Bld: 113 mg/dL — ABNORMAL HIGH (ref 70–99)
Potassium: 3.7 mmol/L (ref 3.5–5.1)
Sodium: 129 mmol/L — ABNORMAL LOW (ref 135–145)
Total Bilirubin: 0.6 mg/dL (ref 0.3–1.2)
Total Protein: 6.9 g/dL (ref 6.5–8.1)

## 2022-04-09 LAB — CBC WITH DIFFERENTIAL/PLATELET
Abs Immature Granulocytes: 0.01 10*3/uL (ref 0.00–0.07)
Basophils Absolute: 0.1 10*3/uL (ref 0.0–0.1)
Basophils Relative: 1 %
Eosinophils Absolute: 0.3 10*3/uL (ref 0.0–0.5)
Eosinophils Relative: 5 %
HCT: 35.4 % — ABNORMAL LOW (ref 39.0–52.0)
Hemoglobin: 11.4 g/dL — ABNORMAL LOW (ref 13.0–17.0)
Immature Granulocytes: 0 %
Lymphocytes Relative: 35 %
Lymphs Abs: 2.2 10*3/uL (ref 0.7–4.0)
MCH: 26.2 pg (ref 26.0–34.0)
MCHC: 32.2 g/dL (ref 30.0–36.0)
MCV: 81.4 fL (ref 80.0–100.0)
Monocytes Absolute: 0.8 10*3/uL (ref 0.1–1.0)
Monocytes Relative: 13 %
Neutro Abs: 2.8 10*3/uL (ref 1.7–7.7)
Neutrophils Relative %: 46 %
Platelets: 240 10*3/uL (ref 150–400)
RBC: 4.35 MIL/uL (ref 4.22–5.81)
RDW: 24.9 % — ABNORMAL HIGH (ref 11.5–15.5)
WBC: 6.2 10*3/uL (ref 4.0–10.5)
nRBC: 0 % (ref 0.0–0.2)

## 2022-04-09 MED ORDER — SODIUM CHLORIDE 0.9 % IV SOLN
2400.0000 mg/m2 | INTRAVENOUS | Status: DC
  Administered 2022-04-09: 4150 mg via INTRAVENOUS
  Filled 2022-04-09: qty 83

## 2022-04-09 MED ORDER — OXALIPLATIN CHEMO INJECTION 100 MG/20ML
85.0000 mg/m2 | Freq: Once | INTRAVENOUS | Status: AC
  Administered 2022-04-09: 145 mg via INTRAVENOUS
  Filled 2022-04-09: qty 20

## 2022-04-09 MED ORDER — SODIUM CHLORIDE 0.9 % IV SOLN
10.0000 mg | Freq: Once | INTRAVENOUS | Status: AC
  Administered 2022-04-09: 10 mg via INTRAVENOUS
  Filled 2022-04-09: qty 10

## 2022-04-09 MED ORDER — DEXTROSE 5 % IV SOLN
Freq: Once | INTRAVENOUS | Status: AC
  Filled 2022-04-09: qty 250

## 2022-04-09 MED ORDER — SODIUM CHLORIDE 0.9% FLUSH
10.0000 mL | Freq: Once | INTRAVENOUS | Status: AC
  Administered 2022-04-09: 10 mL via INTRAVENOUS
  Filled 2022-04-09: qty 10

## 2022-04-09 MED ORDER — FLUOROURACIL CHEMO INJECTION 2.5 GM/50ML
400.0000 mg/m2 | Freq: Once | INTRAVENOUS | Status: AC
  Administered 2022-04-09: 700 mg via INTRAVENOUS
  Filled 2022-04-09: qty 14

## 2022-04-09 MED ORDER — LEUCOVORIN CALCIUM INJECTION 350 MG
700.0000 mg | Freq: Once | INTRAVENOUS | Status: AC
  Administered 2022-04-09: 700 mg via INTRAVENOUS
  Filled 2022-04-09: qty 35

## 2022-04-09 MED ORDER — OXYCODONE HCL 5 MG PO TABS: 5.0000 mg | ORAL_TABLET | ORAL | 0 refills | Status: DC | PRN

## 2022-04-09 MED ORDER — PALONOSETRON HCL INJECTION 0.25 MG/5ML
0.2500 mg | Freq: Once | INTRAVENOUS | Status: AC
  Administered 2022-04-09: 0.25 mg via INTRAVENOUS
  Filled 2022-04-09: qty 5

## 2022-04-09 NOTE — Progress Notes (Signed)
Patient denies any concerns today.  

## 2022-04-09 NOTE — Patient Instructions (Signed)
[facility] AT [place]  Discharge Instructions: ?Thank you for choosing [facility] to provide your oncology and hematology care.  ?If you have a lab appointment with the [facility], please go directly to the [facility] and check in at the registration area. ? ?Wear comfortable clothing and clothing appropriate for easy access to any Portacath or PICC line.  ? ?We strive to give you quality time with your provider. You may need to reschedule your appointment if you arrive late (15 or more minutes).  Arriving late affects you and other patients whose appointments are after yours.  Also, if you miss three or more appointments without notifying the office, you may be dismissed from the clinic at the provider?s discretion.    ?  ?For prescription refill requests, have your pharmacy contact our office and allow 72 hours for refills to be completed.   ? ?Today you received the following chemotherapy and/or immunotherapy agents OXALIPLATIN, LEUCOVORIN, 5 FU    ?  ?To help prevent nausea and vomiting after your treatment, we encourage you to take your nausea medication as directed. ? ?BELOW ARE SYMPTOMS THAT SHOULD BE REPORTED IMMEDIATELY: ?*FEVER GREATER THAN 100.4 F (38 ?C) OR HIGHER ?*CHILLS OR SWEATING ?*NAUSEA AND VOMITING THAT IS NOT CONTROLLED WITH YOUR NAUSEA MEDICATION ?*UNUSUAL SHORTNESS OF BREATH ?*UNUSUAL BRUISING OR BLEEDING ?*URINARY PROBLEMS (pain or burning when urinating, or frequent urination) ?*BOWEL PROBLEMS (unusual diarrhea, constipation, pain near the anus) ?TENDERNESS IN MOUTH AND THROAT WITH OR WITHOUT PRESENCE OF ULCERS (sore throat, sores in mouth, or a toothache) ?UNUSUAL RASH, SWELLING OR PAIN  ?UNUSUAL VAGINAL DISCHARGE OR ITCHING  ? ?Items with * indicate a potential emergency and should be followed up as soon as possible or go to the Emergency Department if any problems should occur. ? ?Please show the CHEMOTHERAPY ALERT CARD or IMMUNOTHERAPY ALERT  CARD at check-in to the Emergency Department and triage nurse. ? ?Should you have questions after your visit or need to cancel or reschedule your appointment, please contact [facility] CANC[facility] [place] AT [place]  [phone] and follow the prompts.  Office hours are 8:00 a.m. to 4:30 p.m. Monday - Friday. Please note that voicemails left after 4:00 p.m. may not be returned until the following business day.  We are closed weekends and major holidays. You have access to a nurse at all times for urgent questions. Please call the main number to the clinic [phone] and follow the prompts. ? ?For any non-urgent questions, you may also contact your provider using MyChart. We now offer e-Visits for anyone 22 and older to request care online for non-urgent symptoms. For details visit [email or web address]. ?  ?Also download the MyChart app! Go to the app store, search "MyChart", open the app, select [facility], and log in with your MyChart username and password. ? ?Due to Covid, a mask is required upon entering the hospital/clinic. If you do not have a mask, one will be given to you upon arrival. For doctor visits, patients may have 1 support person aged 73 or older with them. For treatment visits, patients cannot have anyone with them due to current Covid guidelines and our immunocompromised population.  ? ?Oxaliplatin Injection ?What is this medication? ?OXALIPLATIN (ox AL i PLA tin) is a chemotherapy drug. It targets fast dividing cells, like cancer cells, and causes these cells to die. This medicine is used to treat cancers of the colon and rectum, and many other cancers. ?This medicine may be used for  other purposes; ask your health care provider or pharmacist if you have questions. ?COMMON BRAND NAME(S): Eloxatin ?What should I tell my care team before I take this medication? ?They need to know if you have any of these conditions: ?heart disease ?history of irregular heartbeat ?liver disease ?low blood  counts, like white cells, platelets, or red blood cells ?lung or breathing disease, like asthma ?take medicines that treat or prevent blood clots ?tingling of the fingers or toes, or other nerve disorder ?an unusual or allergic reaction to oxaliplatin, other chemotherapy, other medicines, foods, dyes, or preservatives ?pregnant or trying to get pregnant ?breast-feeding ?How should I use this medication? ?This drug is given as an infusion into a vein. It is administered in a hospital or clinic by a specially trained health care professional. ?Talk to your pediatrician regarding the use of this medicine in children. Special care may be needed. ?Overdosage: If you think you have taken too much of this medicine contact a poison control center or emergency room at once. ?NOTE: This medicine is only for you. Do not share this medicine with others. ?What if I miss a dose? ?It is important not to miss a dose. Call your doctor or health care professional if you are unable to keep an appointment. ?What may interact with this medication? ?Do not take this medicine with any of the following medications: ?cisapride ?dronedarone ?pimozide ?thioridazine ?This medicine may also interact with the following medications: ?aspirin and aspirin-like medicines ?certain medicines that treat or prevent blood clots like warfarin, apixaban, dabigatran, and rivaroxaban ?cisplatin ?cyclosporine ?diuretics ?medicines for infection like acyclovir, adefovir, amphotericin B, bacitracin, cidofovir, foscarnet, ganciclovir, gentamicin, pentamidine, vancomycin ?NSAIDs, medicines for pain and inflammation, like ibuprofen or naproxen ?other medicines that prolong the QT interval (an abnormal heart rhythm) ?pamidronate ?zoledronic acid ?This list may not describe all possible interactions. Give your health care provider a list of all the medicines, herbs, non-prescription drugs, or dietary supplements you use. Also tell them if you smoke, drink alcohol,  or use illegal drugs. Some items may interact with your medicine. ?What should I watch for while using this medication? ?Your condition will be monitored carefully while you are receiving this medicine. ?You may need blood work done while you are taking this medicine. ?This medicine may make you feel generally unwell. This is not uncommon as chemotherapy can affect healthy cells as well as cancer cells. Report any side effects. Continue your course of treatment even though you feel ill unless your healthcare professional tells you to stop. ?This medicine can make you more sensitive to cold. Do not drink cold drinks or use ice. Cover exposed skin before coming in contact with cold temperatures or cold objects. When out in cold weather wear warm clothing and cover your mouth and nose to warm the air that goes into your lungs. Tell your doctor if you get sensitive to the cold. ?Do not become pregnant while taking this medicine or for 9 months after stopping it. Women should inform their health care professional if they wish to become pregnant or think they might be pregnant. Men should not father a child while taking this medicine and for 6 months after stopping it. There is potential for serious side effects to an unborn child. Talk to your health care professional for more information. ?Do not breast-feed a child while taking this medicine or for 3 months after stopping it. ?This medicine has caused ovarian failure in some women. This medicine may make it more difficult  to get pregnant. Talk to your health care professional if you are concerned about your fertility. ?This medicine has caused decreased sperm counts in some men. This may make it more difficult to father a child. Talk to your health care professional if you are concerned about your fertility. ?This medicine may increase your risk of getting an infection. Call your health care professional for advice if you get a fever, chills, or sore throat, or other  symptoms of a cold or flu. Do not treat yourself. Try to avoid being around people who are sick. ?Avoid taking medicines that contain aspirin, acetaminophen, ibuprofen, naproxen, or ketoprofen unless inst

## 2022-04-09 NOTE — Progress Notes (Signed)
Nutrition Follow-up: ? ? ?Patient with stage II adenocarcinoma of rectum.  Patient receiving neoadjuvant folfox.   ? ?Met with patient during infusion.  Patient reports appetite is good.  Did have issues with constipation but miralax helped.  Says that he eats/snacks all the time.  Drinking ensure shakes.  Has cold sensitivity from treatment ? ? ? ?Medications: reviewed ? ?Labs: reviewed ? ?Anthropometrics:  ? ?Weight 125 lb 6.4 oz today ? ?124 lb on 5/5 ?UBW of 155-160 lb ? ?NUTRITION DIAGNOSIS: Inadequate oral intake improving ? ? ?INTERVENTION:  ?Continue oral nutrition supplements ?Continue high calorie, high protein foods ?  ? ?MONITORING, EVALUATION, GOAL: weight trends, intake ? ? ?NEXT VISIT: ~4 weeks ? ?Raijon Lindfors B. Zenia Resides, RD, LDN ?Registered Dietitian ?[phone] ?  ?

## 2022-04-10 ENCOUNTER — Ambulatory Visit (INDEPENDENT_AMBULATORY_CARE_PROVIDER_SITE_OTHER): Payer: Medicare Other | Admitting: Physician Assistant

## 2022-04-10 ENCOUNTER — Ambulatory Visit: Payer: Medicare Other | Admitting: Physician Assistant

## 2022-04-10 DIAGNOSIS — R339 Retention of urine, unspecified: Secondary | ICD-10-CM

## 2022-04-10 LAB — BLADDER SCAN AMB NON-IMAGING: Scan Result: 276 mL

## 2022-04-10 NOTE — Progress Notes (Signed)
Catheter Removal  Patient is present today for a catheter removal.  10m of water was drained from the balloon. A 18FR Coude foley cath was removed from the bladder no complications were noted . Patient tolerated well.  Performed by: RVerlene Mayer CMA  Follow up/ Additional notes: Patient is aware to drink 24oz of water and return this afternoon as scheduled

## 2022-04-10 NOTE — Progress Notes (Signed)
04/10/2022 4:36 PM   Kyle Larson [birth year] [id number]  CC: Chief Complaint  Patient presents with   Urinary Retention   HPI: Kyle Larson is a 66 y.o. 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   Foley catheter removed in the morning, see separate procedure note for details.  He reports he is excited for his voiding trial today and has no acute concerns.  He drank 24 ounces of fluid throughout the day and has been able to urinate multiple times.  PVR 276 mL.  PMH: Past Medical History:  Diagnosis Date   AC joint dislocation    CAD (coronary artery disease)    CVA (cerebral vascular accident) ([place])    Metatarsal bone fracture    3rd metatarsal , left foot; April 2021   Syncope    June '22   Tobacco abuse     Surgical History: Past Surgical History:  Procedure Laterality Date   ACROMIO-CLAVICULAR JOINT REPAIR Left 09/11/2016   Procedure: ACROMIO-CLAVICULAR RECONSTRUCTION;  Surgeon: Corky Mull, MD;  Location: [facility] OR[facility];  Service: Orthopedics;  Laterality: Left;  clavicle   FLEXIBLE SIGMOIDOSCOPY N/A 02/24/2022   Procedure: FLEXIBLE SIGMOIDOSCOPY;  Surgeon: Lesly Rubenstein, MD;  Location: [facility] ENDOS[facility];  Service: Endoscopy;  Laterality: N/A;   IR IMAGING GUIDED PORT INSERTION  03/10/2022   LEFT HEART CATH AND CORONARY ANGIOGRAPHY N/A 05/20/2021   Procedure: LEFT HEART CATH AND CORONARY ANGIOGRAPHY with coronary intervention;  Surgeon: [name], MD;  Location: [place] [facility];  Service: Cardiovascular;  Laterality: N/A;   MANDIBLE RECONSTRUCTION     TONSILLECTOMY     AGE 49   XI ROBOTIC ASSISTED INGUINAL HERNIA REPAIR WITH MESH Right 03/26/2020   Procedure: XI ROBOTIC ASSISTED INGUINAL HERNIA REPAIR WITH MESH;  Surgeon: Herbert Pun, MD;  Location: [facility] OR[facility];  Service: General;  Laterality: Right;    Home Medications:  Allergies as of 04/10/2022   No  Known Allergies      Medication List        Accurate as of Apr 10, 2022  4:36 PM. If you have any questions, ask your nurse or doctor.          acetaminophen 325 MG tablet Commonly known as: Tylenol Take 2 tablets (650 mg total) by mouth every 6 (six) hours as needed for mild pain.   atorvastatin 40 MG tablet Commonly known as: LIPITOR Take 1 tablet (40 mg total) by mouth daily.   feeding supplement Liqd Take 237 mLs by mouth 3 (three) times daily between meals.   fentaNYL 25 MCG/HR Commonly known as: [place] 1 patch onto the skin every 3 (three) days.   ferrous sulfate 325 (65 FE) MG tablet Take 1 tablet (325 mg total) by mouth daily.   folic acid 1 MG tablet Commonly known as: FOLVITE Take 1 tablet (1 mg total) by mouth daily.   lidocaine-prilocaine cream Commonly known as: EMLA Apply to affected area once   multivitamin with minerals tablet Take 1 tablet by mouth daily.   nicotine 21 mg/24hr patch Commonly known as: NICODERM CQ - dosed in mg/24 hours Place 21 mg onto the skin daily.   omeprazole 20 MG capsule Commonly known as: PRILOSEC Take 1 capsule (20 mg total) by mouth daily.   ondansetron 8 MG tablet Commonly known as: ZOFRAN TAKE 1 TABLET 2 TIMES DAILY AS NEEDED FOR REFRACTORY NAUSEA / VOMITING. START DAY 3 AFTER CHEMO  oxyCODONE 5 MG immediate release tablet Commonly known as: Oxy IR/ROXICODONE Take 1 tablet (5 mg total) by mouth every 4 (four) hours as needed for severe pain.   polyethylene glycol 17 g packet Commonly known as: MIRALAX / GLYCOLAX Take 17 g by mouth daily.   prochlorperazine 10 MG tablet Commonly known as: COMPAZINE Take 1 tablet (10 mg total) by mouth every 6 (six) hours as needed (Nausea or vomiting).   sotalol 80 MG tablet Commonly known as: BETAPACE Take 80 mg by mouth daily.   tamsulosin 0.4 MG Caps capsule Commonly known as: FLOMAX Take 1 capsule (0.4 mg total) by mouth daily after supper.   thiamine  100 MG tablet Take 1 tablet (100 mg total) by mouth daily.        Allergies:  No Known Allergies  Family History: Family History  Problem Relation Age of Onset   Diabetes Mother    Prostate cancer Father     Social History:   reports that he has been smoking cigarettes. He has a 3.75 pack-year smoking history. He has been exposed to tobacco smoke. He has never used smokeless tobacco. He reports current alcohol use of about 42.0 standard drinks per week. He reports that he does not use drugs.  Physical Exam: There were no vitals taken for this visit.  Constitutional:  Alert and oriented, no acute distress, nontoxic appearing HEENT: [place], AT Cardiovascular: No clubbing, cyanosis, or edema Respiratory: Normal respiratory effort, no increased work of breathing Skin: No rashes, bruises or suspicious lesions Neurologic: Grossly intact, no focal deficits, moving all 4 extremities Psychiatric: Normal mood and affect  Laboratory Data: Results for orders placed or performed in visit on 04/10/22  BLADDER SCAN AMB NON-IMAGING  Result Value Ref Range   Scan Result 276 mL   Assessment & Plan:   1. Urinary retention Voiding trial passed.  Patient is pleased.  We will plan for repeat PVR in 4 weeks.  We discussed return precautions including lower abdominal pain and the inability to void.  Return in about 4 weeks (around 05/08/2022) for Repeat PVR.  Debroah Loop, PA-C  [facility] [address], [place], [place] [address] [phone]

## 2022-04-11 ENCOUNTER — Encounter: Payer: Self-pay | Admitting: Oncology

## 2022-04-11 ENCOUNTER — Inpatient Hospital Stay: Payer: Medicare Other

## 2022-04-11 VITALS — BP 120/82 | HR 87 | Resp 18

## 2022-04-11 DIAGNOSIS — C20 Malignant neoplasm of rectum: Secondary | ICD-10-CM

## 2022-04-11 DIAGNOSIS — Z5111 Encounter for antineoplastic chemotherapy: Secondary | ICD-10-CM | POA: Diagnosis not present

## 2022-04-11 MED ORDER — SODIUM CHLORIDE 0.9% FLUSH
10.0000 mL | INTRAVENOUS | Status: DC | PRN
  Administered 2022-04-11: 10 mL
  Filled 2022-04-11: qty 10

## 2022-04-11 MED ORDER — HEPARIN SOD (PORK) LOCK FLUSH 100 UNIT/ML IV SOLN
500.0000 [IU] | Freq: Once | INTRAVENOUS | Status: AC | PRN
  Administered 2022-04-11: 500 [IU]
  Filled 2022-04-11: qty 5

## 2022-04-14 ENCOUNTER — Ambulatory Visit (INDEPENDENT_AMBULATORY_CARE_PROVIDER_SITE_OTHER): Payer: Medicare Other | Admitting: Urology

## 2022-04-14 DIAGNOSIS — R339 Retention of urine, unspecified: Secondary | ICD-10-CM | POA: Diagnosis not present

## 2022-04-14 LAB — URINALYSIS, COMPLETE
Bilirubin, UA: NEGATIVE
Glucose, UA: NEGATIVE
Ketones, UA: NEGATIVE
Nitrite, UA: NEGATIVE
Specific Gravity, UA: 1.015 (ref 1.005–1.030)
Urobilinogen, Ur: 0.2 mg/dL (ref 0.2–1.0)
pH, UA: 7 (ref 5.0–7.5)

## 2022-04-14 LAB — MICROSCOPIC EXAMINATION
Bacteria, UA: NONE SEEN
WBC, UA: >30 /hpf — AB (ref 0–5)

## 2022-04-14 LAB — BLADDER SCAN AMB NON-IMAGING

## 2022-04-14 MED ORDER — TAMSULOSIN HCL 0.4 MG PO CAPS: 0.8000 mg | ORAL_CAPSULE | Freq: Every day | ORAL | 11 refills | Status: DC

## 2022-04-14 NOTE — Progress Notes (Signed)
Patient present to office today complaining of frequency/urgency at night, unable to sleep. Patient does not believe he is emptying well and states that sometimes he has to push on his abdomen to help start his stream. UA was collected today and PVR 245m. Per Dr. MMatilde Sprangpatient should increase Flomax to 0.'8mg'$  daily and keep follow up in June. Script sent into pharmacy. Patient is agreeable to this plan. He notes some dysuria with urination worsens with straining. Will send urine for culture to rule out infection. Patient was told that he may utilize AZO OTC to help with dysuria prn.

## 2022-04-17 ENCOUNTER — Telehealth: Payer: Self-pay

## 2022-04-17 LAB — CULTURE, URINE COMPREHENSIVE

## 2022-04-17 NOTE — Telephone Encounter (Signed)
Called pt informed him of the information below. Pt gave verbal understanding. States he is taking flomax BID.

## 2022-04-17 NOTE — Telephone Encounter (Signed)
-----   Message from [place], [place] sent at 04/17/2022 12:40 PM EDT ----- Urine culture negative, ok to keep follow up as planned and continue Flomax 0.'8mg'$  daily.

## 2022-04-18 NOTE — Progress Notes (Unsigned)
[place]  [phone] [phone]) [phone]  ID: Kyle Larson OB: [birth year]  MR#: [id number]  QIO#:[id number]  Patient Care Team: [name], MD as PCP - General (Cardiology) Clent Jacks, RN as Oncology Nurse Navigator Grayland Ormond, Kathlene November, MD as Consulting Physician (Oncology)  CHIEF COMPLAINT: Stage IIa adenocarcinoma of the rectum.  INTERVAL HISTORY: Patient returns to clinic today for further evaluation and consideration of cycle 4 of neoadjuvant FOLFOX.  He does not complain of any mouth pain today.  His pelvic pain is much better controlled on his current narcotic regimen.  His weakness and fatigue are improving.  He has no neurologic complaints.  He denies any recent fevers or illnesses.  He has no chest pain, shortness of breath, cough, or hemoptysis.  He has no nausea, vomiting, constipation, or diarrhea.  He does not report any melena or hematochezia.  He has no urinary complaints.  Patient offers no further specific complaints today.  REVIEW OF SYSTEMS:   Review of Systems  Constitutional:  Positive for malaise/fatigue. Negative for fever and weight loss.  Respiratory: Negative.  Negative for cough, hemoptysis and shortness of breath.   Cardiovascular: Negative.  Negative for chest pain and leg swelling.  Gastrointestinal: Negative.  Negative for abdominal pain, blood in stool, constipation, diarrhea and melena.  Genitourinary: Negative.  Negative for dysuria.  Musculoskeletal:        Pelvic pain  Skin: Negative.  Negative for rash.  Neurological:  Positive for weakness. Negative for dizziness, speech change, focal weakness and headaches.  Psychiatric/Behavioral: Negative.  The patient is not nervous/anxious.    As per HPI. Otherwise, a complete review of systems is negative.  PAST MEDICAL HISTORY: Past Medical History:  Diagnosis Date   AC joint dislocation    CAD (coronary artery disease)    CVA (cerebral vascular accident)  ([place])    Metatarsal bone fracture    3rd metatarsal , left foot; April 2021   Syncope    June '22   Tobacco abuse     PAST SURGICAL HISTORY: Past Surgical History:  Procedure Laterality Date   ACROMIO-CLAVICULAR JOINT REPAIR Left 09/11/2016   Procedure: ACROMIO-CLAVICULAR RECONSTRUCTION;  Surgeon: Corky Mull, MD;  Location: [facility] ORS;  Service: Orthopedics;  Laterality: Left;  clavicle   FLEXIBLE SIGMOIDOSCOPY N/A 02/24/2022   Procedure: FLEXIBLE SIGMOIDOSCOPY;  Surgeon: Lesly Rubenstein, MD;  Location: [facility];  Service: Endoscopy;  Laterality: N/A;   IR IMAGING GUIDED PORT INSERTION  03/10/2022   LEFT HEART CATH AND CORONARY ANGIOGRAPHY N/A 05/20/2021   Procedure: LEFT HEART CATH AND CORONARY ANGIOGRAPHY with coronary intervention;  Surgeon: [name], MD;  Location: [place] [facility];  Service: Cardiovascular;  Laterality: N/A;   MANDIBLE RECONSTRUCTION     TONSILLECTOMY     AGE 5   XI ROBOTIC ASSISTED INGUINAL HERNIA REPAIR WITH MESH Right 03/26/2020   Procedure: XI ROBOTIC ASSISTED INGUINAL HERNIA REPAIR WITH MESH;  Surgeon: Herbert Pun, MD;  Location: [facility] OR[facility];  Service: General;  Laterality: Right;    FAMILY HISTORY: Family History  Problem Relation Age of Onset   Diabetes Mother    Prostate cancer Father     ADVANCED DIRECTIVES (Y/N):  N  HEALTH MAINTENANCE: Social History   Tobacco Use   Smoking status: Some Days    Packs/day: 0.25    Years: 15.00    Pack years: 3.75    Types: Cigarettes    Passive exposure: Current   Smokeless tobacco: Never  Vaping Use   Vaping Use: Never used  Substance Use Topics   Alcohol use: Yes    Alcohol/week: 42.0 standard drinks    Types: 42 Cans of beer per week    Comment: 3 every other day   Drug use: No     Colonoscopy:  PAP:  Bone density:  Lipid panel:  No Known Allergies  Current Outpatient Medications  Medication Sig Dispense Refill   acetaminophen (TYLENOL) 325 MG tablet Take 2  tablets (650 mg total) by mouth every 6 (six) hours as needed for mild pain.     atorvastatin (LIPITOR) 40 MG tablet Take 1 tablet (40 mg total) by mouth daily. 30 tablet 0   feeding supplement (ENSURE ENLIVE / ENSURE PLUS) LIQD Take 237 mLs by mouth 3 (three) times daily between meals. 21330 mL 0   fentaNYL (DURAGESIC) 25 MCG/HR Place 1 patch onto the skin every 3 (three) days. 10 patch 0   ferrous sulfate 325 (65 FE) MG tablet Take 1 tablet (325 mg total) by mouth daily. 30 tablet 0   folic acid (FOLVITE) 1 MG tablet Take 1 tablet (1 mg total) by mouth daily. 30 tablet 0   lidocaine-prilocaine (EMLA) cream Apply to affected area once 30 g 3   Multiple Vitamins-Minerals (MULTIVITAMIN WITH MINERALS) tablet Take 1 tablet by mouth daily.     nicotine (NICODERM CQ - DOSED IN MG/24 HOURS) 21 mg/24hr patch Place 21 mg onto the skin daily.     omeprazole (PRILOSEC) 20 MG capsule Take 1 capsule (20 mg total) by mouth daily. 30 capsule 2   ondansetron (ZOFRAN) 8 MG tablet TAKE 1 TABLET 2 TIMES DAILY AS NEEDED FOR REFRACTORY NAUSEA / VOMITING. START DAY 3 AFTER CHEMO 60 tablet 2   polyethylene glycol (MIRALAX / GLYCOLAX) 17 g packet Take 17 g by mouth daily.     prochlorperazine (COMPAZINE) 10 MG tablet Take 1 tablet (10 mg total) by mouth every 6 (six) hours as needed (Nausea or vomiting). 60 tablet 2   sotalol (BETAPACE) 80 MG tablet Take 80 mg by mouth daily.     tamsulosin (FLOMAX) 0.4 MG CAPS capsule Take 2 capsules (0.8 mg total) by mouth daily. 30 capsule 11   thiamine 100 MG tablet Take 1 tablet (100 mg total) by mouth daily. 30 tablet 0   oxyCODONE (OXY IR/ROXICODONE) 5 MG immediate release tablet Take 1 tablet (5 mg total) by mouth every 4 (four) hours as needed for severe pain. 60 tablet 0   No current facility-administered medications for this visit.    OBJECTIVE: Vitals:   04/23/22 0838  BP: 124/85  Pulse: (!) 113  Resp: 18  Temp: (!) 97.1 F (36.2 C)  SpO2: 95%     Body mass index  is 17.33 kg/m.    ECOG FS:1 - Symptomatic but completely ambulatory  General: Well-developed, well-nourished, no acute distress. Eyes: Pink conjunctiva, anicteric sclera. HEENT: Normocephalic, moist mucous membranes. Lungs: No audible wheezing or coughing. Heart: Regular rate and rhythm. Abdomen: Soft, nontender, no obvious distention.  Colostomy noted. Musculoskeletal: No edema, cyanosis, or clubbing. Neuro: Alert, answering all questions appropriately. Cranial nerves grossly intact. Skin: No rashes or petechiae noted. Psych: Normal affect.    LAB RESULTS:  Lab Results  Component Value Date   NA 131 (L) 04/23/2022   K 3.7 04/23/2022   CL 99 04/23/2022   CO2 25 04/23/2022   GLUCOSE 129 (H) 04/23/2022   BUN <5 (L) 04/23/2022   CREATININE 0.60 (L)  04/23/2022   CALCIUM 9.1 04/23/2022   PROT 7.1 04/23/2022   ALBUMIN 3.4 (L) 04/23/2022   AST 25 04/23/2022   ALT 12 04/23/2022   ALKPHOS 69 04/23/2022   BILITOT 0.3 04/23/2022   GFRNONAA >60 04/23/2022   GFRAA >60 10/03/2016    Lab Results  Component Value Date   WBC 5.8 04/23/2022   NEUTROABS 2.2 04/23/2022   HGB 11.6 (L) 04/23/2022   HCT 35.1 (L) 04/23/2022   MCV 84.4 04/23/2022   PLT 138 (L) 04/23/2022     STUDIES: No results found.  ASSESSMENT: Stage IIa adenocarcinoma of the rectum.  PLAN:    Stage IIa adenocarcinoma of the rectum: CT scan results of chest, abdomen, and pelvis reviewed independently and it does not appear patient has metastatic disease.  MRI of the pelvis completed on March 03, 2022 confirmed stage of disease.  CEA only mildly elevated at 6.7.  Patient has undergone diverting colostomy.  He would likely benefit from neoadjuvant chemotherapy using 8 cycles of FOLFOX followed by concurrent chemotherapy using Xeloda and XRT.  Patient will then require definitive surgery to remove any residual disease.  It is unclear if his colostomy is reversible at this time.  He has had port placed.  Proceed with  cycle 4 of neoadjuvant FOLFOX today.  Return to clinic in 2 days for pump removal and then in 2 weeks for further evaluation and consideration of cycle 5. Pain: Continues to improve.  Continue 25 mcg fentanyl patch every 72 hours as well as 10 mg oxycodone every 4-6 hours as needed.  Appreciate palliative care input. Anemia: Chronic and unchanged.  Patient's hemoglobin is 11.6 today.  Thrombocytopenia: Mild, monitor. Hyponatremia: Chronic and unchanged.  Patient's sodium is slightly improved to 131 today. Mouth pain/poor dentition: Improved with antibiotics.  Patient understands he will require a dental appointment as soon as treatments are completed. Pain at ostomy site: Patient has been instructed to follow-up with his surgeon.   Patient expressed understanding and was in agreement with this plan. He also understands that He can call clinic at any time with any questions, concerns, or complaints.    Cancer Staging  Adenocarcinoma of rectum [facility]) Staging form: Colon and Rectum, AJCC 8th Edition - Clinical stage from 03/10/2022: Stage IIA (cT3, cN0, cM0) - Signed by Lloyd Huger, MD on 03/10/2022 Stage prefix: Initial diagnosis Total positive nodes: 0  Lloyd Huger, MD   04/24/2022 6:22 AM

## 2022-04-22 ENCOUNTER — Telehealth: Payer: Self-pay | Admitting: *Deleted

## 2022-04-22 ENCOUNTER — Other Ambulatory Visit: Payer: Self-pay | Admitting: *Deleted

## 2022-04-22 MED ORDER — FENTANYL 25 MCG/HR TD PT72: 1.0000 | MEDICATED_PATCH | TRANSDERMAL | 0 refills | Status: DC

## 2022-04-22 MED FILL — Dexamethasone Sodium Phosphate Inj 100 MG/10ML: INTRAMUSCULAR | Qty: 1 | Status: AC

## 2022-04-22 NOTE — Telephone Encounter (Signed)
Patient called reporting that the "bag on my side does not look right and I don't think it is working". When questioned further, he reports that he was working on his Lucianne Lei this weekend and may have over done it and that the stoma is "swelled up and not working," I advised that he call Dr Windell Moment who did his colostomy. He was in agreement with this and will call as soon as we disconnected

## 2022-04-23 ENCOUNTER — Inpatient Hospital Stay: Payer: Medicare Other

## 2022-04-23 ENCOUNTER — Other Ambulatory Visit: Payer: Self-pay

## 2022-04-23 ENCOUNTER — Inpatient Hospital Stay (HOSPITAL_BASED_OUTPATIENT_CLINIC_OR_DEPARTMENT_OTHER): Payer: Medicare Other | Admitting: Oncology

## 2022-04-23 ENCOUNTER — Encounter: Payer: Self-pay | Admitting: Oncology

## 2022-04-23 VITALS — BP 124/85 | HR 113 | Temp 97.1°F | Resp 18 | Wt 127.8 lb

## 2022-04-23 DIAGNOSIS — C20 Malignant neoplasm of rectum: Secondary | ICD-10-CM | POA: Diagnosis not present

## 2022-04-23 DIAGNOSIS — Z5111 Encounter for antineoplastic chemotherapy: Secondary | ICD-10-CM | POA: Diagnosis not present

## 2022-04-23 LAB — CBC WITH DIFFERENTIAL/PLATELET
Abs Immature Granulocytes: 0.01 10*3/uL (ref 0.00–0.07)
Basophils Absolute: 0.1 10*3/uL (ref 0.0–0.1)
Basophils Relative: 1 %
Eosinophils Absolute: 0.3 10*3/uL (ref 0.0–0.5)
Eosinophils Relative: 6 %
HCT: 35.1 % — ABNORMAL LOW (ref 39.0–52.0)
Hemoglobin: 11.6 g/dL — ABNORMAL LOW (ref 13.0–17.0)
Immature Granulocytes: 0 %
Lymphocytes Relative: 40 %
Lymphs Abs: 2.3 10*3/uL (ref 0.7–4.0)
MCH: 27.9 pg (ref 26.0–34.0)
MCHC: 33 g/dL (ref 30.0–36.0)
MCV: 84.4 fL (ref 80.0–100.0)
Monocytes Absolute: 0.9 10*3/uL (ref 0.1–1.0)
Monocytes Relative: 16 %
Neutro Abs: 2.2 10*3/uL (ref 1.7–7.7)
Neutrophils Relative %: 37 %
Platelets: 138 10*3/uL — ABNORMAL LOW (ref 150–400)
RBC: 4.16 MIL/uL — ABNORMAL LOW (ref 4.22–5.81)
RDW: 25.4 % — ABNORMAL HIGH (ref 11.5–15.5)
WBC: 5.8 10*3/uL (ref 4.0–10.5)
nRBC: 0 % (ref 0.0–0.2)

## 2022-04-23 LAB — COMPREHENSIVE METABOLIC PANEL
ALT: 12 U/L (ref 0–44)
AST: 25 U/L (ref 15–41)
Albumin: 3.4 g/dL — ABNORMAL LOW (ref 3.5–5.0)
Alkaline Phosphatase: 69 U/L (ref 38–126)
Anion gap: 7 (ref 5–15)
BUN: <5 mg/dL — ABNORMAL LOW (ref 8–23)
CO2: 25 mmol/L (ref 22–32)
Calcium: 9.1 mg/dL (ref 8.9–10.3)
Chloride: 99 mmol/L (ref 98–111)
Creatinine, Ser: 0.6 mg/dL — ABNORMAL LOW (ref 0.61–1.24)
GFR, Estimated: >60 mL/min (ref >60)
Glucose, Bld: 129 mg/dL — ABNORMAL HIGH (ref 70–99)
Potassium: 3.7 mmol/L (ref 3.5–5.1)
Sodium: 131 mmol/L — ABNORMAL LOW (ref 135–145)
Total Bilirubin: 0.3 mg/dL (ref 0.3–1.2)
Total Protein: 7.1 g/dL (ref 6.5–8.1)

## 2022-04-23 MED ORDER — LEUCOVORIN CALCIUM INJECTION 350 MG
700.0000 mg | Freq: Once | INTRAVENOUS | Status: AC
  Administered 2022-04-23: 700 mg via INTRAVENOUS
  Filled 2022-04-23: qty 35

## 2022-04-23 MED ORDER — HEPARIN SOD (PORK) LOCK FLUSH 100 UNIT/ML IV SOLN
500.0000 [IU] | Freq: Once | INTRAVENOUS | Status: DC
  Filled 2022-04-23: qty 5

## 2022-04-23 MED ORDER — PALONOSETRON HCL INJECTION 0.25 MG/5ML
0.2500 mg | Freq: Once | INTRAVENOUS | Status: AC
  Administered 2022-04-23: 0.25 mg via INTRAVENOUS
  Filled 2022-04-23: qty 5

## 2022-04-23 MED ORDER — FLUOROURACIL CHEMO INJECTION 2.5 GM/50ML
400.0000 mg/m2 | Freq: Once | INTRAVENOUS | Status: AC
  Administered 2022-04-23: 700 mg via INTRAVENOUS
  Filled 2022-04-23: qty 14

## 2022-04-23 MED ORDER — OXALIPLATIN CHEMO INJECTION 100 MG/20ML
85.0000 mg/m2 | Freq: Once | INTRAVENOUS | Status: AC
  Administered 2022-04-23: 145 mg via INTRAVENOUS
  Filled 2022-04-23: qty 20

## 2022-04-23 MED ORDER — DEXTROSE 5 % IV SOLN
Freq: Once | INTRAVENOUS | Status: AC
  Filled 2022-04-23: qty 250

## 2022-04-23 MED ORDER — SODIUM CHLORIDE 0.9 % IV SOLN
2400.0000 mg/m2 | INTRAVENOUS | Status: DC
  Administered 2022-04-23: 4150 mg via INTRAVENOUS
  Filled 2022-04-23: qty 83

## 2022-04-23 MED ORDER — SODIUM CHLORIDE 0.9 % IV SOLN
10.0000 mg | Freq: Once | INTRAVENOUS | Status: AC
  Administered 2022-04-23: 10 mg via INTRAVENOUS
  Filled 2022-04-23: qty 10

## 2022-04-23 MED ORDER — SODIUM CHLORIDE 0.9% FLUSH
10.0000 mL | Freq: Once | INTRAVENOUS | Status: AC
  Administered 2022-04-23: 10 mL via INTRAVENOUS
  Filled 2022-04-23: qty 10

## 2022-04-23 MED ORDER — OXYCODONE HCL 5 MG PO TABS: 5.0000 mg | ORAL_TABLET | ORAL | 0 refills | Status: DC | PRN

## 2022-04-23 NOTE — Patient Instructions (Signed)
[facility] AT [place]  Discharge Instructions: Thank you for choosing [facility] to provide your oncology and hematology care.  If you have a lab appointment with the [facility], please go directly to the [facility] and check in at the registration area.  Wear comfortable clothing and clothing appropriate for easy access to any Portacath or PICC line.   We strive to give you quality time with your provider. You may need to reschedule your appointment if you arrive late (15 or more minutes).  Arriving late affects you and other patients whose appointments are after yours.  Also, if you miss three or more appointments without notifying the office, you may be dismissed from the clinic at the provider's discretion.      For prescription refill requests, have your pharmacy contact our office and allow 72 hours for refills to be completed.    Today you received the following chemotherapy and/or immunotherapy agents: Folfox   To help prevent nausea and vomiting after your treatment, we encourage you to take your nausea medication as directed.  BELOW ARE SYMPTOMS THAT SHOULD BE REPORTED IMMEDIATELY: *FEVER GREATER THAN 100.4 F (38 C) OR HIGHER *CHILLS OR SWEATING *NAUSEA AND VOMITING THAT IS NOT CONTROLLED WITH YOUR NAUSEA MEDICATION *UNUSUAL SHORTNESS OF BREATH *UNUSUAL BRUISING OR BLEEDING *URINARY PROBLEMS (pain or burning when urinating, or frequent urination) *BOWEL PROBLEMS (unusual diarrhea, constipation, pain near the anus) TENDERNESS IN MOUTH AND THROAT WITH OR WITHOUT PRESENCE OF ULCERS (sore throat, sores in mouth, or a toothache) UNUSUAL RASH, SWELLING OR PAIN  UNUSUAL VAGINAL DISCHARGE OR ITCHING   Items with * indicate a potential emergency and should be followed up as soon as possible or go to the Emergency Department if any problems should occur.  Please show the CHEMOTHERAPY ALERT CARD or IMMUNOTHERAPY ALERT CARD at check-in to the  Emergency Department and triage nurse.  Should you have questions after your visit or need to cancel or reschedule your appointment, please contact [facility] [place] AT [place]  [phone] and follow the prompts.  Office hours are 8:00 a.m. to 4:30 p.m. Monday - Friday. Please note that voicemails left after 4:00 p.m. may not be returned until the following business day.  We are closed weekends and major holidays. You have access to a nurse at all times for urgent questions. Please call the main number to the clinic [phone] and follow the prompts.  For any non-urgent questions, you may also contact your provider using MyChart. We now offer e-Visits for anyone 74 and older to request care online for non-urgent symptoms. For details visit [email or web address].   Also download the MyChart app! Go to the app store, search "MyChart", open the app, select [facility], and log in with your MyChart username and password.  Due to Covid, a mask is required upon entering the hospital/clinic. If you do not have a mask, one will be given to you upon arrival. For doctor visits, patients may have 1 support person aged 15 or older with them. For treatment visits, patients cannot have anyone with them due to current Covid guidelines and our immunocompromised population.

## 2022-04-23 NOTE — Progress Notes (Signed)
Patient tolerated Folfox infusion well, no questions/concerns voiced. Patient home with pump. Stable at discharge. AVS given.

## 2022-04-23 NOTE — Progress Notes (Unsigned)
Pt here for follow up. No new concerns voiced.   

## 2022-04-24 ENCOUNTER — Encounter: Payer: Self-pay | Admitting: Oncology

## 2022-04-25 ENCOUNTER — Inpatient Hospital Stay: Payer: Medicare Other | Attending: Oncology

## 2022-04-25 VITALS — BP 125/78 | HR 94 | Temp 96.7°F | Resp 18

## 2022-04-25 DIAGNOSIS — E876 Hypokalemia: Secondary | ICD-10-CM | POA: Insufficient documentation

## 2022-04-25 DIAGNOSIS — Z79899 Other long term (current) drug therapy: Secondary | ICD-10-CM | POA: Insufficient documentation

## 2022-04-25 DIAGNOSIS — C20 Malignant neoplasm of rectum: Secondary | ICD-10-CM | POA: Diagnosis present

## 2022-04-25 DIAGNOSIS — Z5111 Encounter for antineoplastic chemotherapy: Secondary | ICD-10-CM | POA: Diagnosis present

## 2022-04-25 MED ORDER — SODIUM CHLORIDE 0.9% FLUSH
10.0000 mL | INTRAVENOUS | Status: DC | PRN
  Administered 2022-04-25: 10 mL
  Filled 2022-04-25: qty 10

## 2022-04-25 MED ORDER — HEPARIN SOD (PORK) LOCK FLUSH 100 UNIT/ML IV SOLN
500.0000 [IU] | Freq: Once | INTRAVENOUS | Status: AC | PRN
  Administered 2022-04-25: 500 [IU]
  Filled 2022-04-25: qty 5

## 2022-04-28 ENCOUNTER — Encounter: Payer: Self-pay | Admitting: Surgery

## 2022-04-28 ENCOUNTER — Telehealth: Payer: Self-pay | Admitting: Surgery

## 2022-04-28 ENCOUNTER — Ambulatory Visit (INDEPENDENT_AMBULATORY_CARE_PROVIDER_SITE_OTHER): Payer: Medicare Other | Admitting: Surgery

## 2022-04-28 ENCOUNTER — Telehealth: Payer: Self-pay | Admitting: *Deleted

## 2022-04-28 VITALS — BP 127/86 | HR 89 | Temp 98.5°F | Ht 72.0 in | Wt 126.6 lb

## 2022-04-28 DIAGNOSIS — K9409 Other complications of colostomy: Secondary | ICD-10-CM | POA: Diagnosis not present

## 2022-04-28 MED ORDER — OXYCODONE HCL 10 MG PO TABS: 5.0000 mg | ORAL_TABLET | ORAL | 0 refills | Status: DC | PRN

## 2022-04-28 NOTE — Patient Instructions (Signed)
Try using table sugar on the stoma to help reduce the swelling.  Follow up here in 6 weeks.

## 2022-04-28 NOTE — Progress Notes (Signed)
Outpatient Surgical Follow Up  04/28/2022  Gleen Ripberger is an 66 y.o. male.   Chief Complaint  Patient presents with   Routine Post Op    HPI: Patient is a 66 year old male well-known to me with history of rectal cancer that was complicated with large bowel obstruction requiring a loop colostomy.  He is doing well but has significant chronic pain issues and is currently on fentanyl and oxycodone.  He is undergoing chemotherapy at this time.  Recently while changing the brakes calipers and different parts of his Lucianne Lei developed prolapse of the ostomy.  He is tolerating diet and having good ostomy output.  No fevers no chills.  Still needs to complete radiation after chemotherapy.  Past Medical History:  Diagnosis Date   AC joint dislocation    CAD (coronary artery disease)    CVA (cerebral vascular accident) ([place])    Metatarsal bone fracture    3rd metatarsal , left foot; April 2021   Syncope    June '22   Tobacco abuse     Past Surgical History:  Procedure Laterality Date   ACROMIO-CLAVICULAR JOINT REPAIR Left 09/11/2016   Procedure: ACROMIO-CLAVICULAR RECONSTRUCTION;  Surgeon: Corky Mull, MD;  Location: [facility];  Service: Orthopedics;  Laterality: Left;  clavicle   FLEXIBLE SIGMOIDOSCOPY N/A 02/24/2022   Procedure: FLEXIBLE SIGMOIDOSCOPY;  Surgeon: Lesly Rubenstein, MD;  Location: [facility];  Service: Endoscopy;  Laterality: N/A;   IR IMAGING GUIDED PORT INSERTION  03/10/2022   LEFT HEART CATH AND CORONARY ANGIOGRAPHY N/A 05/20/2021   Procedure: LEFT HEART CATH AND CORONARY ANGIOGRAPHY with coronary intervention;  Surgeon: [name], MD;  Location: [facility];  Service: Cardiovascular;  Laterality: N/A;   MANDIBLE RECONSTRUCTION     TONSILLECTOMY     AGE 32   XI ROBOTIC ASSISTED INGUINAL HERNIA REPAIR WITH MESH Right 03/26/2020   Procedure: XI ROBOTIC ASSISTED INGUINAL HERNIA REPAIR WITH MESH;  Surgeon: Herbert Pun, MD;  Location: [facility];   Service: General;  Laterality: Right;    Family History  Problem Relation Age of Onset   Diabetes Mother    Prostate cancer Father     Social History:  reports that he has been smoking cigarettes. He has a 3.75 pack-year smoking history. He has been exposed to tobacco smoke. He has never used smokeless tobacco. He reports current alcohol use of about 42.0 standard drinks per week. He reports that he does not use drugs.  Allergies: No Known Allergies  Medications reviewed.    ROS Full ROS performed and is otherwise negative other than what is stated in HPI   BP 127/86   Pulse 89   Temp 98.5 F (36.9 C)   Ht 6' (1.829 m)   Wt 126 lb 9.6 oz (57.4 kg)   SpO2 98%   BMI 17.17 kg/m   Physical Exam Vitals and nursing note reviewed. Exam conducted with a chaperone present.  Constitutional:      General: He is not in acute distress.    Appearance: Normal appearance. He is normal weight. He is not toxic-appearing.  Cardiovascular:     Rate and Rhythm: Normal rate.  Pulmonary:     Effort: Pulmonary effort is normal. No respiratory distress.     Breath sounds: Normal breath sounds. No stridor. No wheezing.  Abdominal:     General: Abdomen is flat. There is no distension.     Palpations: Abdomen is soft. There is no mass.     Tenderness:  There is no abdominal tenderness. There is no guarding or rebound.     Hernia: No hernia is present.     Comments: Loop colOstomy in place w prolapse, no evidence of ischemia, no necrosis, widely patent.  Musculoskeletal:        General: No swelling or tenderness. Normal range of motion.     Cervical back: Normal range of motion and neck supple. No rigidity or tenderness.  Lymphadenopathy:     Cervical: No cervical adenopathy.  Skin:    General: Skin is warm and dry.     Capillary Refill: Capillary refill takes less than 2 seconds.  Neurological:     General: No focal deficit present.     Mental Status: He is alert and oriented to person,  place, and time.  Psychiatric:        Mood and Affect: Mood normal.        Behavior: Behavior normal.        Thought Content: Thought content normal.        Judgment: Judgment normal.     Assessment/Plan: 66 year old male with rectal cancer prior evidence of large bowel obstruction requiring loop colostomy.   Currently undergoing chemotherapy.  She is malnourished and does have ostomy prolapse.  Discussed with the patient and the wife in detail about ostomy prolapse.  I do not want to do any surgical intervention as I do not want to burn any bridges.  Likely he will benefit from local wound care including sugar.  I will only reserve the ostomy revision if the prolapse is significantly interfering with his daily life.  Also advised about avoiding any heavy lifting. He will also need radiation therapy and hopefully radical resection at tertiary facility and at that time the ostomy can be addressed. Please note that I spent greater than 40 minutes in this encounter including coordination of his care, personally reviewing images, counseling the patient and performing appropriate and documentation  Caroleen Hamman, MD [facility] Surgeon

## 2022-04-28 NOTE — Telephone Encounter (Signed)
Patient called stating that [facility] has been unable to fill his O[facility]codone prescription because it is on national backorder and he is requesting we send it to another pharmacy ([facility] in [place] if possible) that has it in stock. I have called both [facility] and [facility] and they do not have it in stock. [facility] with [facility] and they can fill prescription with 10 mg tabs to take 1/2 tablet (5 mg). Patient is out of medicine

## 2022-04-28 NOTE — Telephone Encounter (Signed)
Opened in error

## 2022-04-29 ENCOUNTER — Encounter: Payer: Self-pay | Admitting: Oncology

## 2022-05-05 ENCOUNTER — Inpatient Hospital Stay (HOSPITAL_BASED_OUTPATIENT_CLINIC_OR_DEPARTMENT_OTHER): Payer: Medicare Other | Admitting: Hospice and Palliative Medicine

## 2022-05-05 DIAGNOSIS — C20 Malignant neoplasm of rectum: Secondary | ICD-10-CM

## 2022-05-05 NOTE — Progress Notes (Signed)
Virtual Visit via Telephone Note  I connected with Kyle Larson on 05/05/22 at  1:00 PM EDT by telephone and verified that I am speaking with the correct person using two identifiers.  Location: Patient: Telephone Provider: Home   I discussed the limitations, risks, security and privacy concerns of performing an evaluation and management service by telephone and the availability of in person appointments. I also discussed with the patient that there may be a patient responsible charge related to this service. The patient expressed understanding and agreed to proceed.   History of Present Illness: Kyle Larson is a 66 y.o. 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  Patient has been symptomatic with pain and poor appetite.  He is referred to palliative care to help address goals and manage ongoing symptoms.   Observations/Objective: I called and spoke with patient and wife.  Patient reports she is doing reasonably well other than issues related to his ostomy prolapse.  Patient saw Dr. Perrin Maltese recently and was noted to have ostomy prolapse.  Conservative measures including use of sugar was recommended.  Patient says that this is not helping and it is starting to interfere with placing the ostomy bag.  I recommended that he follow-up with Dr. Perrin Maltese.  Patient reports stable pain on opioids.  Assessment and Plan: Ostomy prolapse -patient instructed to follow-up with Dr. Perrin Maltese  Neoplasm related pain -stable on oxycodone and fentanyl  Follow Up Instructions: RTC later this week to see Dr. Grayland Ormond.  Follow-up telephone visit 1-2 months   I discussed the assessment and treatment plan with the patient. The patient was provided an opportunity to ask questions and all were answered. The patient agreed with  the plan and demonstrated an understanding of the instructions.   The patient was advised to call back or seek an in-person evaluation if the symptoms worsen or if the condition fails to improve as anticipated.  I provided 5 minutes of non-face-to-face time during this encounter.   Irean Hong, NP

## 2022-05-06 NOTE — Progress Notes (Unsigned)
[facility]  Telephone:([phone]) [phone] Fax:(336) [phone]  ID: Kyle Larson OB: 1956/11/18  MR#: [id number]  QIO#:[id number]  Patient Care Team: [name], MD as PCP - General (Cardiology) Clent Jacks, RN as Oncology Nurse Navigator Grayland Ormond, Kathlene November, MD as Consulting Physician (Oncology)  CHIEF COMPLAINT: Stage IIa adenocarcinoma of the rectum.  INTERVAL HISTORY: Patient returns to clinic today for further evaluation and consideration of cycle 5 of neoadjuvant FOLFOX.  He continues to have issues with his ostomy including pain, but otherwise feels well.  He no longer complains of mouth pain.  He has no neurologic complaints.  He denies any recent fevers or illnesses.  He has no chest pain, shortness of breath, cough, or hemoptysis.  He has no nausea, vomiting, constipation, or diarrhea.  He does not report any melena or hematochezia.  He has no urinary complaints.  Patient offers no further specific complaints today.  REVIEW OF SYSTEMS:   Review of Systems  Constitutional: Negative.  Negative for fever, malaise/fatigue and weight loss.  Respiratory: Negative.  Negative for cough, hemoptysis and shortness of breath.   Cardiovascular: Negative.  Negative for chest pain and leg swelling.  Gastrointestinal: Negative.  Negative for abdominal pain, blood in stool, constipation, diarrhea and melena.  Genitourinary: Negative.  Negative for dysuria.  Skin: Negative.  Negative for rash.  Neurological: Negative.  Negative for dizziness, speech change, focal weakness, weakness and headaches.  Psychiatric/Behavioral: Negative.  The patient is not nervous/anxious.     As per HPI. Otherwise, a complete review of systems is negative.  PAST MEDICAL HISTORY: Past Medical History:  Diagnosis Date   AC joint dislocation    CAD (coronary artery disease)    CVA (cerebral vascular accident) ([place])    Metatarsal bone fracture    3rd metatarsal , left foot; April 2021    Syncope    June '22   Tobacco abuse     PAST SURGICAL HISTORY: Past Surgical History:  Procedure Laterality Date   ACROMIO-CLAVICULAR JOINT REPAIR Left 09/11/2016   Procedure: ACROMIO-CLAVICULAR RECONSTRUCTION;  Surgeon: Corky Mull, MD;  Location: [facility];  Service: Orthopedics;  Laterality: Left;  clavicle   FLEXIBLE SIGMOIDOSCOPY N/A 02/24/2022   Procedure: FLEXIBLE SIGMOIDOSCOPY;  Surgeon: Lesly Rubenstein, MD;  Location: [facility];  Service: Endoscopy;  Laterality: N/A;   IR IMAGING GUIDED PORT INSERTION  03/10/2022   LEFT HEART CATH AND CORONARY ANGIOGRAPHY N/A 05/20/2021   Procedure: LEFT HEART CATH AND CORONARY ANGIOGRAPHY with coronary intervention;  Surgeon: [name], MD;  Location: [facility];  Service: Cardiovascular;  Laterality: N/A;   MANDIBLE RECONSTRUCTION     TONSILLECTOMY     AGE 27   XI ROBOTIC ASSISTED INGUINAL HERNIA REPAIR WITH MESH Right 03/26/2020   Procedure: XI ROBOTIC ASSISTED INGUINAL HERNIA REPAIR WITH MESH;  Surgeon: Herbert Pun, MD;  Location: [facility];  Service: General;  Laterality: Right;    FAMILY HISTORY: Family History  Problem Relation Age of Onset   Diabetes Mother    Prostate cancer Father     ADVANCED DIRECTIVES (Y/N):  N  HEALTH MAINTENANCE: Social History   Tobacco Use   Smoking status: Some Days    Packs/day: 0.25    Years: 15.00    Total pack years: 3.75    Types: Cigarettes    Passive exposure: Current   Smokeless tobacco: Never  Vaping Use   Vaping Use: Never used  Substance Use Topics   Alcohol use: Yes  Alcohol/week: 42.0 standard drinks of alcohol    Types: 42 Cans of beer per week    Comment: 3 every other day   Drug use: No     Colonoscopy:  PAP:  Bone density:  Lipid panel:  No Known Allergies  Current Outpatient Medications  Medication Sig Dispense Refill   acetaminophen (TYLENOL) 325 MG tablet Take 2 tablets (650 mg total) by mouth every 6 (six) hours as needed for mild  pain.     atorvastatin (LIPITOR) 40 MG tablet Take 1 tablet (40 mg total) by mouth daily. 30 tablet 0   feeding supplement (ENSURE ENLIVE / ENSURE PLUS) LIQD Take 237 mLs by mouth 3 (three) times daily between meals. 56389 mL 0   folic acid (FOLVITE) 1 MG tablet Take 1 tablet (1 mg total) by mouth daily. 30 tablet 0   lidocaine-prilocaine (EMLA) cream Apply to affected area once 30 g 3   lidocaine-prilocaine (EMLA) cream Apply 1 application  topically as needed. Apply cream to port 1 hour prior to chemotherapy. Cover with plastic wrap. 30 g 3   Multiple Vitamins-Minerals (MULTIVITAMIN WITH MINERALS) tablet Take 1 tablet by mouth daily.     nicotine (NICODERM CQ - DOSED IN MG/24 HOURS) 21 mg/24hr patch Place 21 mg onto the skin daily.     omeprazole (PRILOSEC) 20 MG capsule Take 1 capsule (20 mg total) by mouth daily. 30 capsule 2   ondansetron (ZOFRAN) 8 MG tablet TAKE 1 TABLET 2 TIMES DAILY AS NEEDED FOR REFRACTORY NAUSEA / VOMITING. START DAY 3 AFTER CHEMO 60 tablet 2   Oxycodone HCl 10 MG TABS Take 0.5 tablets (5 mg total) by mouth every 4 (four) hours as needed. 60 tablet 0   polyethylene glycol (MIRALAX / GLYCOLAX) 17 g packet Take 17 g by mouth daily.     prochlorperazine (COMPAZINE) 10 MG tablet Take 1 tablet (10 mg total) by mouth every 6 (six) hours as needed (Nausea or vomiting). 60 tablet 2   sildenafil (VIAGRA) 25 MG tablet Take 1 tablet (25 mg total) by mouth daily as needed for erectile dysfunction. 10 tablet 0   sotalol (BETAPACE) 80 MG tablet Take 80 mg by mouth daily.     tamsulosin (FLOMAX) 0.4 MG CAPS capsule Take 2 capsules (0.8 mg total) by mouth daily. 30 capsule 11   thiamine 100 MG tablet Take 1 tablet (100 mg total) by mouth daily. 30 tablet 0   fentaNYL (DURAGESIC) 50 MCG/HR Place 1 patch onto the skin every 3 (three) days. 10 patch 0   ferrous sulfate 325 (65 FE) MG tablet Take 1 tablet (325 mg total) by mouth daily. 30 tablet 0   No current facility-administered  medications for this visit.   Facility-Administered Medications Ordered in Other Visits  Medication Dose Route Frequency Provider Last Rate Last Admin   fluorouracil (ADRUCIL) 4,150 mg in sodium chloride 0.9 % 67 mL chemo infusion  2,400 mg/m2 (Treatment Plan Recorded) Intravenous 1 day or 1 dose Grayland Ormond, Kathlene November, MD       fluorouracil (ADRUCIL) chemo injection 700 mg  400 mg/m2 (Treatment Plan Recorded) Intravenous Once Lloyd Huger, MD       leucovorin 700 mg in dextrose 5 % 250 mL infusion  700 mg Intravenous Once Lloyd Huger, MD 143 mL/hr at 05/07/22 1136 700 mg at 05/07/22 1136   oxaliplatin (ELOXATIN) 145 mg in dextrose 5 % 500 mL chemo infusion  85 mg/m2 (Treatment Plan Recorded) Intravenous Once Lloyd Huger,  MD 265 mL/hr at 05/07/22 1135 145 mg at 05/07/22 1135    OBJECTIVE: Vitals:   05/07/22 0946  BP: 129/83  Pulse: 83  Resp: 18  Temp: (!) 96.8 F (36 C)  SpO2: 100%     Body mass index is 17.2 kg/m.    ECOG FS:1 - Symptomatic but completely ambulatory  General: Well-developed, well-nourished, no acute distress. Eyes: Pink conjunctiva, anicteric sclera. HEENT: Normocephalic, moist mucous membranes. Lungs: No audible wheezing or coughing. Heart: Regular rate and rhythm. Abdomen: Soft, nontender, no obvious distention.  Colostomy noted. Musculoskeletal: No edema, cyanosis, or clubbing. Neuro: Alert, answering all questions appropriately. Cranial nerves grossly intact. Skin: No rashes or petechiae noted. Psych: Normal affect.  LAB RESULTS:  Lab Results  Component Value Date   NA 133 (L) 05/07/2022   K 3.9 05/07/2022   CL 100 05/07/2022   CO2 26 05/07/2022   GLUCOSE 102 (H) 05/07/2022   BUN 7 (L) 05/07/2022   CREATININE 0.65 05/07/2022   CALCIUM 8.9 05/07/2022   PROT 6.8 05/07/2022   ALBUMIN 3.2 (L) 05/07/2022   AST 25 05/07/2022   ALT 12 05/07/2022   ALKPHOS 67 05/07/2022   BILITOT 0.6 05/07/2022   GFRNONAA >60 05/07/2022   GFRAA >60  10/03/2016    Lab Results  Component Value Date   WBC 4.6 05/07/2022   NEUTROABS 1.1 (L) 05/07/2022   HGB 10.7 (L) 05/07/2022   HCT 32.3 (L) 05/07/2022   MCV 86.8 05/07/2022   PLT 161 05/07/2022     STUDIES: No results found.  ASSESSMENT: Stage IIa adenocarcinoma of the rectum.  PLAN:    Stage IIa adenocarcinoma of the rectum: CT scan results of chest, abdomen, and pelvis reviewed independently and it does not appear patient has metastatic disease.  MRI of the pelvis completed on March 03, 2022 confirmed stage of disease.  CEA only mildly elevated at 6.7.  Patient has undergone diverting colostomy.  He would likely benefit from neoadjuvant chemotherapy using 8 cycles of FOLFOX followed by concurrent chemotherapy using Xeloda and XRT.  Patient will then require definitive surgery to remove any residual disease.  It is unclear if his colostomy is reversible at this time.  He has had port placed.  Proceed with cycle 5 of neoadjuvant FOLFOX today.  Return to clinic in 2 days for pump removal and then in 2 weeks for further evaluation and consideration of cycle 6.   Pain: Mostly related to his ostomy.  Patient has increased his fentanyl to 50 mcg every 72 hours and only uses oxycodone sparingly.  He was given a refill today.  Appreciate palliative care input. Anemia: Hemoglobin has trended down slightly to 10.7, monitor. Thrombocytopenia: Resolved. Hyponatremia: Sodium mildly improved to 133.   Mouth pain/poor dentition: Improved with antibiotics.  Patient understands he will require a dental appointment as soon as treatments are completed. Pain at ostomy site: Continue evaluation and monitoring per surgery.  Increase fentanyl as above.   Patient expressed understanding and was in agreement with this plan. He also understands that He can call clinic at any time with any questions, concerns, or complaints.    Cancer Staging  Adenocarcinoma of rectum [facility]) Staging form: Colon and Rectum,  AJCC 8th Edition - Clinical stage from 03/10/2022: Stage IIA (cT3, cN0, cM0) - Signed by Lloyd Huger, MD on 03/10/2022 Stage prefix: Initial diagnosis Total positive nodes: 0  Lloyd Huger, MD   05/07/2022 11:59 AM

## 2022-05-07 ENCOUNTER — Inpatient Hospital Stay: Payer: Medicare Other

## 2022-05-07 ENCOUNTER — Inpatient Hospital Stay (HOSPITAL_BASED_OUTPATIENT_CLINIC_OR_DEPARTMENT_OTHER): Payer: Medicare Other | Admitting: Oncology

## 2022-05-07 ENCOUNTER — Encounter: Payer: Self-pay | Admitting: Oncology

## 2022-05-07 VITALS — BP 129/83 | HR 83 | Temp 96.8°F | Resp 18 | Wt 126.8 lb

## 2022-05-07 DIAGNOSIS — C20 Malignant neoplasm of rectum: Secondary | ICD-10-CM

## 2022-05-07 DIAGNOSIS — Z5111 Encounter for antineoplastic chemotherapy: Secondary | ICD-10-CM | POA: Diagnosis not present

## 2022-05-07 LAB — CBC WITH DIFFERENTIAL/PLATELET
Abs Immature Granulocytes: 0 10*3/uL (ref 0.00–0.07)
Basophils Absolute: 0.1 10*3/uL (ref 0.0–0.1)
Basophils Relative: 2 %
Eosinophils Absolute: 0.3 10*3/uL (ref 0.0–0.5)
Eosinophils Relative: 6 %
HCT: 32.3 % — ABNORMAL LOW (ref 39.0–52.0)
Hemoglobin: 10.7 g/dL — ABNORMAL LOW (ref 13.0–17.0)
Immature Granulocytes: 0 %
Lymphocytes Relative: 49 %
Lymphs Abs: 2.3 10*3/uL (ref 0.7–4.0)
MCH: 28.8 pg (ref 26.0–34.0)
MCHC: 33.1 g/dL (ref 30.0–36.0)
MCV: 86.8 fL (ref 80.0–100.0)
Monocytes Absolute: 0.9 10*3/uL (ref 0.1–1.0)
Monocytes Relative: 20 %
Neutro Abs: 1.1 10*3/uL — ABNORMAL LOW (ref 1.7–7.7)
Neutrophils Relative %: 23 %
Platelets: 161 10*3/uL (ref 150–400)
RBC: 3.72 MIL/uL — ABNORMAL LOW (ref 4.22–5.81)
RDW: 25.8 % — ABNORMAL HIGH (ref 11.5–15.5)
WBC: 4.6 10*3/uL (ref 4.0–10.5)
nRBC: 0 % (ref 0.0–0.2)

## 2022-05-07 LAB — COMPREHENSIVE METABOLIC PANEL
ALT: 12 U/L (ref 0–44)
AST: 25 U/L (ref 15–41)
Albumin: 3.2 g/dL — ABNORMAL LOW (ref 3.5–5.0)
Alkaline Phosphatase: 67 U/L (ref 38–126)
Anion gap: 7 (ref 5–15)
BUN: 7 mg/dL — ABNORMAL LOW (ref 8–23)
CO2: 26 mmol/L (ref 22–32)
Calcium: 8.9 mg/dL (ref 8.9–10.3)
Chloride: 100 mmol/L (ref 98–111)
Creatinine, Ser: 0.65 mg/dL (ref 0.61–1.24)
GFR, Estimated: >60 mL/min (ref >60)
Glucose, Bld: 102 mg/dL — ABNORMAL HIGH (ref 70–99)
Potassium: 3.9 mmol/L (ref 3.5–5.1)
Sodium: 133 mmol/L — ABNORMAL LOW (ref 135–145)
Total Bilirubin: 0.6 mg/dL (ref 0.3–1.2)
Total Protein: 6.8 g/dL (ref 6.5–8.1)

## 2022-05-07 MED ORDER — LIDOCAINE-PRILOCAINE 2.5-2.5 % EX CREA: 1.0000 "application " | TOPICAL_CREAM | CUTANEOUS | 3 refills | Status: DC | PRN

## 2022-05-07 MED ORDER — FLUOROURACIL CHEMO INJECTION 2.5 GM/50ML
400.0000 mg/m2 | Freq: Once | INTRAVENOUS | Status: AC
  Administered 2022-05-07: 700 mg via INTRAVENOUS
  Filled 2022-05-07: qty 14

## 2022-05-07 MED ORDER — DEXTROSE 5 % IV SOLN
Freq: Once | INTRAVENOUS | Status: AC
  Filled 2022-05-07: qty 250

## 2022-05-07 MED ORDER — OXALIPLATIN CHEMO INJECTION 100 MG/20ML
85.0000 mg/m2 | Freq: Once | INTRAVENOUS | Status: AC
  Administered 2022-05-07: 145 mg via INTRAVENOUS
  Filled 2022-05-07: qty 29

## 2022-05-07 MED ORDER — FENTANYL 50 MCG/HR TD PT72: 1.0000 | MEDICATED_PATCH | TRANSDERMAL | 0 refills | Status: DC

## 2022-05-07 MED ORDER — SILDENAFIL CITRATE 25 MG PO TABS: 25.0000 mg | ORAL_TABLET | Freq: Every day | ORAL | 0 refills | Status: DC | PRN

## 2022-05-07 MED ORDER — SODIUM CHLORIDE 0.9 % IV SOLN
10.0000 mg | Freq: Once | INTRAVENOUS | Status: AC
  Administered 2022-05-07: 10 mg via INTRAVENOUS
  Filled 2022-05-07: qty 10

## 2022-05-07 MED ORDER — LEUCOVORIN CALCIUM INJECTION 350 MG
700.0000 mg | Freq: Once | INTRAVENOUS | Status: AC
  Administered 2022-05-07: 700 mg via INTRAVENOUS
  Filled 2022-05-07: qty 35

## 2022-05-07 MED ORDER — SODIUM CHLORIDE 0.9 % IV SOLN
2400.0000 mg/m2 | INTRAVENOUS | Status: DC
  Administered 2022-05-07: 4150 mg via INTRAVENOUS
  Filled 2022-05-07: qty 83

## 2022-05-07 MED ORDER — PALONOSETRON HCL INJECTION 0.25 MG/5ML
0.2500 mg | Freq: Once | INTRAVENOUS | Status: AC
  Administered 2022-05-07: 0.25 mg via INTRAVENOUS
  Filled 2022-05-07: qty 5

## 2022-05-07 NOTE — Patient Instructions (Signed)
[facility]  Discharge Instructions: Thank you for choosing [facility] to provide your oncology and hematology care.  If you have a lab appointment with the Cancer Center, please go directly to the Cancer Center and check in at the registration area.  Wear comfortable clothing and clothing appropriate for easy access to any Portacath or PICC line.   We strive to give you quality time with your provider. You may need to reschedule your appointment if you arrive late (15 or more minutes).  Arriving late affects you and other patients whose appointments are after yours.  Also, if you miss three or more appointments without notifying the office, you may be dismissed from the clinic at the provider's discretion.      For prescription refill requests, have your pharmacy contact our office and allow 72 hours for refills to be completed.    Today you received the following chemotherapy and/or immunotherapy agents Oxaliplatin, Leucovorin & Adrucil      To help prevent nausea and vomiting after your treatment, we encourage you to take your nausea medication as directed.  BELOW ARE SYMPTOMS THAT SHOULD BE REPORTED IMMEDIATELY: *FEVER GREATER THAN 100.4 F (38 C) OR HIGHER *CHILLS OR SWEATING *NAUSEA AND VOMITING THAT IS NOT CONTROLLED WITH YOUR NAUSEA MEDICATION *UNUSUAL SHORTNESS OF BREATH *UNUSUAL BRUISING OR BLEEDING *URINARY PROBLEMS (pain or burning when urinating, or frequent urination) *BOWEL PROBLEMS (unusual diarrhea, constipation, pain near the anus) TENDERNESS IN MOUTH AND THROAT WITH OR WITHOUT PRESENCE OF ULCERS (sore throat, sores in mouth, or a toothache) UNUSUAL RASH, SWELLING OR PAIN  UNUSUAL VAGINAL DISCHARGE OR ITCHING   Items with * indicate a potential emergency and should be followed up as soon as possible or go to the Emergency Department if any problems should occur.  Please show the CHEMOTHERAPY ALERT CARD or IMMUNOTHERAPY  ALERT CARD at check-in to the Emergency Department and triage nurse.  Should you have questions after your visit or need to cancel or reschedule your appointment, please contact [facility]  [phone] and follow the prompts.  Office hours are 8:00 a.m. to 4:30 p.m. Monday - Friday. Please note that voicemails left after 4:00 p.m. may not be returned until the following business day.  We are closed weekends and major holidays. You have access to a nurse at all times for urgent questions. Please call the main number to the clinic [phone] and follow the prompts.  For any non-urgent questions, you may also contact your provider using MyChart. We now offer e-Visits for anyone 18 and older to request care online for non-urgent symptoms. For details visit [email or web address].   Also download the MyChart app! Go to the app store, search "MyChart", open the app, select [facility], and log in with your MyChart username and password.  Masks are optional in the cancer centers. If you would like for your care team to wear a mask while they are taking care of you, please let them know. For doctor visits, patients may have with them one support person who is at least 65 years old. At this time, visitors are not allowed in the infusion area.   

## 2022-05-07 NOTE — Progress Notes (Signed)
Patient here today for follow up and treatment consideration regarding rectal cancer. Patient reports worsening pain to stoma, he was seen by Dr. Dahlia Byes and has follow up scheduled on 7/12. Patient reports he did use 2 fentanyl patches due to worsening pain.

## 2022-05-07 NOTE — Progress Notes (Signed)
Nutrition Follow-up:   Patient with stage II adenocarcinoma of rectum.  Patient receiving neoadjuvant folfox.    Met with patient during infusion.  Reports that his appetite is good.  Says that he is eating something, mostly sandwiches q 2 hours.  Eating ice cream sandwiches, chips, crackers, green beans, corn, fried okra.  Drinking ensure shakes.  Denies nausea or nutrition impact symptoms.     Medications: reviewed  Labs: reviewed  Anthropometrics:   Weight 126 lb 12.8 oz today  125 lb on 5/17 124 lb on 5/5 UBW of 155-160 lb   NUTRITION DIAGNOSIS: Inadequate oral intake improving   INTERVENTION:  Encouraged adding fruits and vegetables in diet.   Continue oral nutrition supplements    MONITORING, EVALUATION, GOAL: weight trends, intake   NEXT VISIT: ~ 4 weeks  Meleah Demeyer B. Zenia Resides, [place], [place] Registered Dietitian [phone]

## 2022-05-08 ENCOUNTER — Ambulatory Visit (INDEPENDENT_AMBULATORY_CARE_PROVIDER_SITE_OTHER): Payer: Medicare Other | Admitting: Physician Assistant

## 2022-05-08 ENCOUNTER — Encounter: Payer: Self-pay | Admitting: Physician Assistant

## 2022-05-08 VITALS — BP 112/72 | HR 99 | Ht 72.0 in | Wt 126.0 lb

## 2022-05-08 DIAGNOSIS — R339 Retention of urine, unspecified: Secondary | ICD-10-CM | POA: Diagnosis not present

## 2022-05-08 DIAGNOSIS — K9409 Other complications of colostomy: Secondary | ICD-10-CM

## 2022-05-08 LAB — BLADDER SCAN AMB NON-IMAGING

## 2022-05-08 NOTE — Progress Notes (Signed)
05/08/2022 10:28 AM   Kyle Larson June 19, 1956 [id number]  CC: Chief Complaint  Patient presents with   Urinary Retention   HPI: Kyle Larson is a 66 y.o. 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   Today he reports he remains on Flomax 0.8 mg daily and has no urinary concerns today.  PVR 22 mL.  He has had some recent problems with his loop colostomy including prolapse, of which Dr. Dahlia Byes is aware.  He states the prolapse was particularly bad several days ago, however it has reduced somewhat on its own.  PMH: Past Medical History:  Diagnosis Date   AC joint dislocation    CAD (coronary artery disease)    CVA (cerebral vascular accident) ([place])    Metatarsal bone fracture    3rd metatarsal , left foot; April 2021   Syncope    June '22   Tobacco abuse     Surgical History: Past Surgical History:  Procedure Laterality Date   ACROMIO-CLAVICULAR JOINT REPAIR Left 09/11/2016   Procedure: ACROMIO-CLAVICULAR RECONSTRUCTION;  Surgeon: Corky Mull, MD;  Location: [facility];  Service: Orthopedics;  Laterality: Left;  clavicle   FLEXIBLE SIGMOIDOSCOPY N/A 02/24/2022   Procedure: FLEXIBLE SIGMOIDOSCOPY;  Surgeon: Lesly Rubenstein, MD;  Location: [facility];  Service: Endoscopy;  Laterality: N/A;   IR IMAGING GUIDED PORT INSERTION  03/10/2022   LEFT HEART CATH AND CORONARY ANGIOGRAPHY N/A 05/20/2021   Procedure: LEFT HEART CATH AND CORONARY ANGIOGRAPHY with coronary intervention;  Surgeon: [name], MD;  Location: [place] [facility];  Service: Cardiovascular;  Laterality: N/A;   MANDIBLE RECONSTRUCTION     TONSILLECTOMY     AGE 32   XI ROBOTIC ASSISTED INGUINAL HERNIA REPAIR WITH MESH Right 03/26/2020   Procedure: XI ROBOTIC ASSISTED INGUINAL HERNIA REPAIR WITH MESH;  Surgeon: Herbert Pun, MD;  Location: [facility];  Service:  General;  Laterality: Right;    Home Medications:  Allergies as of 05/08/2022   No Known Allergies      Medication List        Accurate as of May 08, 2022 10:28 AM. If you have any questions, ask your nurse or doctor.          acetaminophen 325 MG tablet Commonly known as: Tylenol Take 2 tablets (650 mg total) by mouth every 6 (six) hours as needed for mild pain.   atorvastatin 40 MG tablet Commonly known as: LIPITOR Take 1 tablet (40 mg total) by mouth daily.   feeding supplement Liqd Take 237 mLs by mouth 3 (three) times daily between meals.   fentaNYL 50 MCG/HR Commonly known as: Benson 1 patch onto the skin every 3 (three) days.   ferrous sulfate 325 (65 FE) MG tablet Take 1 tablet (325 mg total) by mouth daily.   folic acid 1 MG tablet Commonly known as: FOLVITE Take 1 tablet (1 mg total) by mouth daily.   lidocaine-prilocaine cream Commonly known as: EMLA Apply to affected area once   lidocaine-prilocaine cream Commonly known as: EMLA Apply 1 application  topically as needed. Apply cream to port 1 hour prior to chemotherapy. Cover with plastic wrap.   multivitamin with minerals tablet Take 1 tablet by mouth daily.   nicotine 21 mg/24hr patch Commonly known as: NICODERM CQ - dosed in mg/24 hours Place 21 mg onto the skin daily.   omeprazole  20 MG capsule Commonly known as: PRILOSEC Take 1 capsule (20 mg total) by mouth daily.   ondansetron 8 MG tablet Commonly known as: ZOFRAN TAKE 1 TABLET 2 TIMES DAILY AS NEEDED FOR REFRACTORY NAUSEA / VOMITING. START DAY 3 AFTER CHEMO   Oxycodone HCl 10 MG Tabs Take 0.5 tablets (5 mg total) by mouth every 4 (four) hours as needed.   polyethylene glycol 17 g packet Commonly known as: MIRALAX / GLYCOLAX Take 17 g by mouth daily.   prochlorperazine 10 MG tablet Commonly known as: COMPAZINE Take 1 tablet (10 mg total) by mouth every 6 (six) hours as needed (Nausea or vomiting).   sildenafil 25 MG  tablet Commonly known as: Viagra Take 1 tablet (25 mg total) by mouth daily as needed for erectile dysfunction.   sotalol 80 MG tablet Commonly known as: BETAPACE Take 80 mg by mouth daily.   tamsulosin 0.4 MG Caps capsule Commonly known as: FLOMAX Take 2 capsules (0.8 mg total) by mouth daily.   thiamine 100 MG tablet Take 1 tablet (100 mg total) by mouth daily.        Allergies:  No Known Allergies  Family History: Family History  Problem Relation Age of Onset   Diabetes Mother    Prostate cancer Father     Social History:   reports that he has been smoking cigarettes. He has a 3.75 pack-year smoking history. He has been exposed to tobacco smoke. He has never used smokeless tobacco. He reports current alcohol use of about 42.0 standard drinks of alcohol per week. He reports that he does not use drugs.  Physical Exam: BP 112/72 (BP Location: Left Arm, Patient Position: Sitting, Cuff Size: Normal)   Pulse 99   Ht 6' (1.829 m)   Wt 126 lb (57.2 kg)   BMI 17.09 kg/m   Constitutional:  Alert and oriented, no acute distress, nontoxic appearing HEENT: [place], AT Cardiovascular: No clubbing, cyanosis, or edema Respiratory: Normal respiratory effort, no increased work of breathing GU: Prolapsed LLQ colostomy extending approximately 9 cm from the abdominal wall.  Tissue is viable appearing. Skin: No rashes, bruises or suspicious lesions Neurologic: Grossly intact, no focal deficits, moving all 4 extremities Psychiatric: Normal mood and affect  Laboratory Data: Results for orders placed or performed in visit on 05/08/22  Bladder Scan (Post Void Residual) in office  Result Value Ref Range   Scan Result 64m    Assessment & Plan:   1. Urinary retention He continues to empty appropriately at this time.  Counseled him to continue Flomax 0.8 mg daily. - Bladder Scan (Post Void Residual) in office  2. Colostomy prolapse (HCC) Viable-appearing, Dr. PDahlia Byesis aware.  Encouraged  follow-up with Dr. PDahlia Byes  Return in about 6 months (around 11/07/2022) for Rectal cancer f/u with Dr. SDiamantina Providence  SDebroah Loop PA-C  [facility] [address] [place] [phone]

## 2022-05-09 ENCOUNTER — Inpatient Hospital Stay: Payer: Medicare Other

## 2022-05-09 DIAGNOSIS — Z5111 Encounter for antineoplastic chemotherapy: Secondary | ICD-10-CM | POA: Diagnosis not present

## 2022-05-09 DIAGNOSIS — C20 Malignant neoplasm of rectum: Secondary | ICD-10-CM

## 2022-05-09 MED ORDER — SODIUM CHLORIDE 0.9% FLUSH
10.0000 mL | INTRAVENOUS | Status: DC | PRN
  Administered 2022-05-09: 10 mL
  Filled 2022-05-09: qty 10

## 2022-05-09 MED ORDER — HEPARIN SOD (PORK) LOCK FLUSH 100 UNIT/ML IV SOLN
500.0000 [IU] | Freq: Once | INTRAVENOUS | Status: AC | PRN
  Administered 2022-05-09: 500 [IU]
  Filled 2022-05-09: qty 5

## 2022-05-12 ENCOUNTER — Other Ambulatory Visit (HOSPITAL_COMMUNITY): Payer: Self-pay

## 2022-05-12 ENCOUNTER — Encounter: Payer: Self-pay | Admitting: Oncology

## 2022-05-13 ENCOUNTER — Encounter: Payer: Self-pay | Admitting: Oncology

## 2022-05-16 NOTE — Progress Notes (Signed)
[place]  [phone]) [phone] [phone]) [phone]  ID: Kyle Larson OB: 07-Oct-1956  MR#: [id number]  IWL#:[id number]  Patient Care Team: [name], MD as PCP - General (Cardiology) Clent Jacks, RN as Oncology Nurse Navigator Grayland Ormond, Kathlene November, MD as Consulting Physician (Oncology)  CHIEF COMPLAINT: Stage IIa adenocarcinoma of the rectum.  INTERVAL HISTORY: Patient returns to clinic today for further evaluation and consideration of cycle 6 of neoadjuvant FOLFOX.  He is having significant back pain this week and continues to have issues with his colostomy, but otherwise feels well.  He no longer complains of mouth pain.  He has no neurologic complaints.  He denies any recent fevers or illnesses.  He has no chest pain, shortness of breath, cough, or hemoptysis.  He has no nausea, vomiting, constipation, or diarrhea.  He does not report any melena or hematochezia.  He has no urinary complaints.  Patient offers no further specific complaints today.  REVIEW OF SYSTEMS:   Review of Systems  Constitutional: Negative.  Negative for fever, malaise/fatigue and weight loss.  Respiratory: Negative.  Negative for cough, hemoptysis and shortness of breath.   Cardiovascular: Negative.  Negative for chest pain and leg swelling.  Gastrointestinal: Negative.  Negative for abdominal pain, blood in stool, constipation, diarrhea and melena.  Genitourinary: Negative.  Negative for dysuria.  Musculoskeletal:  Positive for back pain.  Skin: Negative.  Negative for rash.  Neurological: Negative.  Negative for dizziness, speech change, focal weakness, weakness and headaches.  Psychiatric/Behavioral: Negative.  The patient is not nervous/anxious.     As per HPI. Otherwise, a complete review of systems is negative.  PAST MEDICAL HISTORY: Past Medical History:  Diagnosis Date   AC joint dislocation    CAD (coronary artery disease)    CVA (cerebral vascular accident)  ([place])    Metatarsal bone fracture    3rd metatarsal , left foot; April 2021   Syncope    June '22   Tobacco abuse     PAST SURGICAL HISTORY: Past Surgical History:  Procedure Laterality Date   ACROMIO-CLAVICULAR JOINT REPAIR Left 09/11/2016   Procedure: ACROMIO-CLAVICULAR RECONSTRUCTION;  Surgeon: Corky Mull, MD;  Location: [facility];  Service: Orthopedics;  Laterality: Left;  clavicle   FLEXIBLE SIGMOIDOSCOPY N/A 02/24/2022   Procedure: FLEXIBLE SIGMOIDOSCOPY;  Surgeon: Lesly Rubenstein, MD;  Location: [facility];  Service: Endoscopy;  Laterality: N/A;   IR IMAGING GUIDED PORT INSERTION  03/10/2022   LEFT HEART CATH AND CORONARY ANGIOGRAPHY N/A 05/20/2021   Procedure: LEFT HEART CATH AND CORONARY ANGIOGRAPHY with coronary intervention;  Surgeon: [name], MD;  Location: [place] [facility];  Service: Cardiovascular;  Laterality: N/A;   MANDIBLE RECONSTRUCTION     TONSILLECTOMY     AGE 66   XI ROBOTIC ASSISTED INGUINAL HERNIA REPAIR WITH MESH Right 03/26/2020   Procedure: XI ROBOTIC ASSISTED INGUINAL HERNIA REPAIR WITH MESH;  Surgeon: Herbert Pun, MD;  Location: [facility];  Service: General;  Laterality: Right;    FAMILY HISTORY: Family History  Problem Relation Age of Onset   Diabetes Mother    Prostate cancer Father     ADVANCED DIRECTIVES (Y/N):  N  HEALTH MAINTENANCE: Social History   Tobacco Use   Smoking status: Some Days    Packs/day: 0.25    Years: 15.00    Total pack years: 3.75    Types: Cigarettes    Passive exposure: Current   Smokeless tobacco: Never  Vaping Use   Vaping  Use: Never used  Substance Use Topics   Alcohol use: Yes    Alcohol/week: 42.0 standard drinks of alcohol    Types: 42 Cans of beer per week    Comment: 3 every other day   Drug use: No     Colonoscopy:  PAP:  Bone density:  Lipid panel:  No Known Allergies  Current Outpatient Medications  Medication Sig Dispense Refill   acetaminophen (TYLENOL) 325 MG  tablet Take 2 tablets (650 mg total) by mouth every 6 (six) hours as needed for mild pain.     atorvastatin (LIPITOR) 40 MG tablet Take 1 tablet (40 mg total) by mouth daily. 30 tablet 0   feeding supplement (ENSURE ENLIVE / ENSURE PLUS) LIQD Take 237 mLs by mouth 3 (three) times daily between meals. 21330 mL 0   fentaNYL (DURAGESIC) 50 MCG/HR Place 1 patch onto the skin every 3 (three) days. 10 patch 0   folic acid (FOLVITE) 1 MG tablet Take 1 tablet (1 mg total) by mouth daily. 30 tablet 0   lidocaine-prilocaine (EMLA) cream Apply to affected area once 30 g 3   lidocaine-prilocaine (EMLA) cream Apply 1 application  topically as needed. Apply cream to port 1 hour prior to chemotherapy. Cover with plastic wrap. 30 g 3   Multiple Vitamins-Minerals (MULTIVITAMIN WITH MINERALS) tablet Take 1 tablet by mouth daily.     nicotine (NICODERM CQ - DOSED IN MG/24 HOURS) 21 mg/24hr patch Place 21 mg onto the skin daily.     omeprazole (PRILOSEC) 20 MG capsule Take 1 capsule (20 mg total) by mouth daily. 30 capsule 2   ondansetron (ZOFRAN) 8 MG tablet TAKE 1 TABLET 2 TIMES DAILY AS NEEDED FOR REFRACTORY NAUSEA / VOMITING. START DAY 3 AFTER CHEMO 60 tablet 2   polyethylene glycol (MIRALAX / GLYCOLAX) 17 g packet Take 17 g by mouth daily.     prochlorperazine (COMPAZINE) 10 MG tablet Take 1 tablet (10 mg total) by mouth every 6 (six) hours as needed (Nausea or vomiting). 60 tablet 2   sildenafil (VIAGRA) 25 MG tablet Take 1 tablet (25 mg total) by mouth daily as needed for erectile dysfunction. 10 tablet 0   sotalol (BETAPACE) 80 MG tablet Take 80 mg by mouth daily.     tamsulosin (FLOMAX) 0.4 MG CAPS capsule Take 2 capsules (0.8 mg total) by mouth daily. 30 capsule 11   thiamine 100 MG tablet Take 1 tablet (100 mg total) by mouth daily. 30 tablet 0   ferrous sulfate 325 (65 FE) MG tablet Take 1 tablet (325 mg total) by mouth daily. 30 tablet 0   Oxycodone HCl 10 MG TABS Take 0.5-1 tablets (5-10 mg total) by  mouth every 4 (four) hours as needed. 60 tablet 0   No current facility-administered medications for this visit.    OBJECTIVE: Vitals:   05/21/22 0926  BP: 102/71  Pulse: 91  Resp: 16  Temp: (!) 97.4 F (36.3 C)  SpO2: 100%     Body mass index is 16.7 kg/m.    ECOG FS:1 - Symptomatic but completely ambulatory  General: Well-developed, well-nourished, no acute distress. Eyes: Pink conjunctiva, anicteric sclera. HEENT: Normocephalic, moist mucous membranes. Lungs: No audible wheezing or coughing. Heart: Regular rate and rhythm. Abdomen: Soft, nontender, no obvious distention. Musculoskeletal: No edema, cyanosis, or clubbing. Neuro: Alert, answering all questions appropriately. Cranial nerves grossly intact. Skin: No rashes or petechiae noted. Psych: Normal affect.  LAB RESULTS:  Lab Results  Component Value Date  NA 123 (L) 05/21/2022   K 2.8 (L) 05/21/2022   CL 90 (L) 05/21/2022   CO2 25 05/21/2022   GLUCOSE 120 (H) 05/21/2022   BUN 6 (L) 05/21/2022   CREATININE 0.63 05/21/2022   CALCIUM 8.4 (L) 05/21/2022   PROT 7.0 05/21/2022   ALBUMIN 3.3 (L) 05/21/2022   AST 31 05/21/2022   ALT 18 05/21/2022   ALKPHOS 73 05/21/2022   BILITOT 0.7 05/21/2022   GFRNONAA >60 05/21/2022   GFRAA >60 10/03/2016    Lab Results  Component Value Date   WBC 3.5 (L) 05/21/2022   NEUTROABS 0.6 (L) 05/21/2022   HGB 11.0 (L) 05/21/2022   HCT 32.8 (L) 05/21/2022   MCV 87.2 05/21/2022   PLT 293 05/21/2022     STUDIES: No results found.  ASSESSMENT: Stage IIa adenocarcinoma of the rectum.  PLAN:    Stage IIa adenocarcinoma of the rectum: CT scan results of chest, abdomen, and pelvis reviewed independently and it does not appear patient has metastatic disease.  MRI of the pelvis completed on March 03, 2022 confirmed stage of disease.  CEA only mildly elevated at 6.7.  Patient has undergone diverting colostomy.  He would likely benefit from neoadjuvant chemotherapy using 8 cycles  of FOLFOX followed by concurrent chemotherapy using Xeloda and XRT.  Patient will then require definitive surgery to remove any residual disease.  It is unclear if his colostomy is reversible at this time.  He has had port placed.  Delay cycle 6 of neoadjuvant FOLFOX today secondary to neutropenia.  Return to clinic in 1 week for further evaluation and reconsideration of treatment.   Pain: Mostly related to his ostomy.  His back pain seems musculoskeletal.  Continue fentanyl to 50 mcg every 72 hours and only uses oxycodone sparingly.  Appreciate palliative care input. Anemia: Chronic and unchanged.  Patient's hemoglobin is 11.0. Thrombocytopenia: Resolved. Hyponatremia: Significantly worse today.  Patient sodium is 123.  Delay treatment as above.  Patient will instead receive IV fluids.   Mouth pain/poor dentition: Improved with antibiotics.  Patient understands he will require a dental appointment as soon as treatments are completed. Pain at ostomy site: Continue evaluation and monitoring per surgery.  Narcotic regimen as above. Hypokalemia: Delay treatment as above.  Patient will receive 40 mill equivalents IV potassium. Hypomagnesia: Proceed with 2 g IV magnesium. Neutropenia: Delay treatment as above.  Patient expressed understanding and was in agreement with this plan. He also understands that He can call clinic at any time with any questions, concerns, or complaints.    Cancer Staging  Adenocarcinoma of rectum [facility]) Staging form: Colon and Rectum, AJCC 8th Edition - Clinical stage from 03/10/2022: Stage IIA (cT3, cN0, cM0) - Signed by Lloyd Huger, MD on 03/10/2022 Stage prefix: Initial diagnosis Total positive nodes: 0  Lloyd Huger, MD   05/22/2022 9:24 AM

## 2022-05-20 MED FILL — Dexamethasone Sodium Phosphate Inj 100 MG/10ML: INTRAMUSCULAR | Qty: 1 | Status: AC

## 2022-05-21 ENCOUNTER — Other Ambulatory Visit: Payer: Self-pay

## 2022-05-21 ENCOUNTER — Inpatient Hospital Stay: Payer: Medicare Other

## 2022-05-21 ENCOUNTER — Inpatient Hospital Stay (HOSPITAL_BASED_OUTPATIENT_CLINIC_OR_DEPARTMENT_OTHER): Payer: Medicare Other | Admitting: Oncology

## 2022-05-21 ENCOUNTER — Inpatient Hospital Stay (HOSPITAL_BASED_OUTPATIENT_CLINIC_OR_DEPARTMENT_OTHER): Payer: Medicare Other | Admitting: Hospice and Palliative Medicine

## 2022-05-21 ENCOUNTER — Encounter: Payer: Self-pay | Admitting: Oncology

## 2022-05-21 VITALS — BP 102/71 | HR 91 | Temp 97.4°F | Resp 16 | Ht 72.0 in | Wt 123.1 lb

## 2022-05-21 DIAGNOSIS — C20 Malignant neoplasm of rectum: Secondary | ICD-10-CM | POA: Diagnosis not present

## 2022-05-21 DIAGNOSIS — Z5111 Encounter for antineoplastic chemotherapy: Secondary | ICD-10-CM | POA: Diagnosis not present

## 2022-05-21 DIAGNOSIS — Z515 Encounter for palliative care: Secondary | ICD-10-CM

## 2022-05-21 DIAGNOSIS — E876 Hypokalemia: Secondary | ICD-10-CM

## 2022-05-21 LAB — COMPREHENSIVE METABOLIC PANEL
ALT: 18 U/L (ref 0–44)
AST: 31 U/L (ref 15–41)
Albumin: 3.3 g/dL — ABNORMAL LOW (ref 3.5–5.0)
Alkaline Phosphatase: 73 U/L (ref 38–126)
Anion gap: 8 (ref 5–15)
BUN: 6 mg/dL — ABNORMAL LOW (ref 8–23)
CO2: 25 mmol/L (ref 22–32)
Calcium: 8.4 mg/dL — ABNORMAL LOW (ref 8.9–10.3)
Chloride: 90 mmol/L — ABNORMAL LOW (ref 98–111)
Creatinine, Ser: 0.63 mg/dL (ref 0.61–1.24)
GFR, Estimated: >60 mL/min (ref >60)
Glucose, Bld: 120 mg/dL — ABNORMAL HIGH (ref 70–99)
Potassium: 2.8 mmol/L — ABNORMAL LOW (ref 3.5–5.1)
Sodium: 123 mmol/L — ABNORMAL LOW (ref 135–145)
Total Bilirubin: 0.7 mg/dL (ref 0.3–1.2)
Total Protein: 7 g/dL (ref 6.5–8.1)

## 2022-05-21 LAB — CBC WITH DIFFERENTIAL/PLATELET
Abs Immature Granulocytes: 0.05 10*3/uL (ref 0.00–0.07)
Basophils Absolute: 0.1 10*3/uL (ref 0.0–0.1)
Basophils Relative: 1 %
Eosinophils Absolute: 0.1 10*3/uL (ref 0.0–0.5)
Eosinophils Relative: 2 %
HCT: 32.8 % — ABNORMAL LOW (ref 39.0–52.0)
Hemoglobin: 11 g/dL — ABNORMAL LOW (ref 13.0–17.0)
Immature Granulocytes: 1 %
Lymphocytes Relative: 48 %
Lymphs Abs: 1.6 10*3/uL (ref 0.7–4.0)
MCH: 29.3 pg (ref 26.0–34.0)
MCHC: 33.5 g/dL (ref 30.0–36.0)
MCV: 87.2 fL (ref 80.0–100.0)
Monocytes Absolute: 1 10*3/uL (ref 0.1–1.0)
Monocytes Relative: 30 %
Neutro Abs: 0.6 10*3/uL — ABNORMAL LOW (ref 1.7–7.7)
Neutrophils Relative %: 18 %
Platelets: 293 10*3/uL (ref 150–400)
RBC: 3.76 MIL/uL — ABNORMAL LOW (ref 4.22–5.81)
RDW: 23.9 % — ABNORMAL HIGH (ref 11.5–15.5)
WBC: 3.5 10*3/uL — ABNORMAL LOW (ref 4.0–10.5)
nRBC: 0 % (ref 0.0–0.2)

## 2022-05-21 LAB — MAGNESIUM: Magnesium: 1.6 mg/dL — ABNORMAL LOW (ref 1.7–2.4)

## 2022-05-21 MED ORDER — DEXTROSE 5 % IV SOLN
Freq: Once | INTRAVENOUS | Status: AC
  Filled 2022-05-21: qty 250

## 2022-05-21 MED ORDER — PALONOSETRON HCL INJECTION 0.25 MG/5ML: 0.2500 mg | Freq: Once | INTRAVENOUS | Status: DC

## 2022-05-21 MED ORDER — MAGNESIUM SULFATE 2 GM/50ML IV SOLN
2.0000 g | Freq: Once | INTRAVENOUS | Status: AC
  Administered 2022-05-21: 2 g via INTRAVENOUS
  Filled 2022-05-21: qty 50

## 2022-05-21 MED ORDER — SODIUM CHLORIDE 0.9% FLUSH
10.0000 mL | INTRAVENOUS | Status: DC | PRN
  Administered 2022-05-21: 10 mL
  Filled 2022-05-21: qty 10

## 2022-05-21 MED ORDER — POTASSIUM CHLORIDE 20 MEQ/100ML IV SOLN
20.0000 meq | Freq: Once | INTRAVENOUS | Status: AC
  Administered 2022-05-21: 20 meq via INTRAVENOUS

## 2022-05-21 MED ORDER — SODIUM CHLORIDE 0.9 % IV SOLN: 40.0000 meq | Freq: Once | INTRAVENOUS | Status: DC

## 2022-05-21 MED ORDER — SODIUM CHLORIDE 0.9 % IV SOLN
10.0000 mg | Freq: Once | INTRAVENOUS | Status: DC
  Filled 2022-05-21: qty 1

## 2022-05-21 MED ORDER — OXALIPLATIN CHEMO INJECTION 100 MG/20ML: 85.0000 mg/m2 | Freq: Once | INTRAVENOUS | Status: DC

## 2022-05-21 MED ORDER — SODIUM CHLORIDE 0.9 % IV SOLN: 2400.0000 mg/m2 | INTRAVENOUS | Status: DC

## 2022-05-21 MED ORDER — OXYCODONE HCL 10 MG PO TABS: 5.0000 mg | ORAL_TABLET | ORAL | 0 refills | Status: DC | PRN

## 2022-05-21 MED ORDER — HEPARIN SOD (PORK) LOCK FLUSH 100 UNIT/ML IV SOLN
500.0000 [IU] | Freq: Once | INTRAVENOUS | Status: AC | PRN
  Administered 2022-05-21: 500 [IU]
  Filled 2022-05-21: qty 5

## 2022-05-21 MED ORDER — FLUOROURACIL CHEMO INJECTION 2.5 GM/50ML: 400.0000 mg/m2 | Freq: Once | INTRAVENOUS | Status: DC

## 2022-05-21 MED ORDER — SODIUM CHLORIDE 0.9 % IV SOLN
Freq: Once | INTRAVENOUS | Status: AC
  Filled 2022-05-21: qty 250

## 2022-05-21 MED ORDER — LEUCOVORIN CALCIUM INJECTION 350 MG: 400.0000 mg/m2 | Freq: Once | INTRAVENOUS | Status: DC
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
## 2022-05-21 NOTE — Progress Notes (Signed)
K 2.8, Mg 1.6, & ANC 0.6. Per Dr. Grayland Ormond, no treatment today. Patient to receive K '40MG'$  & Mag 2g.

## 2022-05-22 ENCOUNTER — Encounter: Payer: Self-pay | Admitting: Oncology

## 2022-05-23 ENCOUNTER — Inpatient Hospital Stay: Payer: Medicare Other

## 2022-06-03 NOTE — Progress Notes (Unsigned)
[place]  Telephone:(336) [phone] Fax:(336) [phone]  ID: Kyle Larson OB: June 01, 1956  MR#: [id number]  IZT#:[id number]  Patient Care Team: [name], MD as PCP - General (Cardiology) Clent Jacks, RN as Oncology Nurse Navigator Grayland Ormond, Kathlene November, MD as Consulting Physician (Oncology)  CHIEF COMPLAINT: Stage IIa adenocarcinoma of the rectum.  INTERVAL HISTORY: Patient returns to clinic today for further evaluation and reconsideration of cycle 6 of neoadjuvant FOLFOX.  He continues to have significant back pain and now reports increased weakness and fatigue. He no longer complains of mouth pain.  He has no neurologic complaints.  He denies any recent fevers or illnesses.  He has no chest pain, shortness of breath, cough, or hemoptysis.  He has no nausea, vomiting, constipation, or diarrhea.  He does not report any melena or hematochezia.  He has no urinary complaints.  Patient offers no further specific complaints today.  REVIEW OF SYSTEMS:   Review of Systems  Constitutional:  Positive for malaise/fatigue. Negative for fever and weight loss.  Respiratory: Negative.  Negative for cough, hemoptysis and shortness of breath.   Cardiovascular: Negative.  Negative for chest pain and leg swelling.  Gastrointestinal: Negative.  Negative for abdominal pain, blood in stool, constipation, diarrhea and melena.  Genitourinary: Negative.  Negative for dysuria.  Musculoskeletal:  Positive for back pain.  Skin: Negative.  Negative for rash.  Neurological:  Positive for weakness. Negative for dizziness, speech change, focal weakness and headaches.  Psychiatric/Behavioral: Negative.  The patient is not nervous/anxious.     As per HPI. Otherwise, a complete review of systems is negative.  PAST MEDICAL HISTORY: Past Medical History:  Diagnosis Date   AC joint dislocation    CAD (coronary artery disease)    CVA (cerebral vascular accident) ([place])    Metatarsal bone  fracture    3rd metatarsal , left foot; April 2021   Syncope    June '22   Tobacco abuse     PAST SURGICAL HISTORY: Past Surgical History:  Procedure Laterality Date   ACROMIO-CLAVICULAR JOINT REPAIR Left 09/11/2016   Procedure: ACROMIO-CLAVICULAR RECONSTRUCTION;  Surgeon: Corky Mull, MD;  Location: [facility];  Service: Orthopedics;  Laterality: Left;  clavicle   FLEXIBLE SIGMOIDOSCOPY N/A 02/24/2022   Procedure: FLEXIBLE SIGMOIDOSCOPY;  Surgeon: Lesly Rubenstein, MD;  Location: [facility];  Service: Endoscopy;  Laterality: N/A;   IR IMAGING GUIDED PORT INSERTION  03/10/2022   LEFT HEART CATH AND CORONARY ANGIOGRAPHY N/A 05/20/2021   Procedure: LEFT HEART CATH AND CORONARY ANGIOGRAPHY with coronary intervention;  Surgeon: [name], MD;  Location: [facility];  Service: Cardiovascular;  Laterality: N/A;   MANDIBLE RECONSTRUCTION     TONSILLECTOMY     AGE 66   XI ROBOTIC ASSISTED INGUINAL HERNIA REPAIR WITH MESH Right 03/26/2020   Procedure: XI ROBOTIC ASSISTED INGUINAL HERNIA REPAIR WITH MESH;  Surgeon: Herbert Pun, MD;  Location: [facility];  Service: General;  Laterality: Right;    FAMILY HISTORY: Family History  Problem Relation Age of Onset   Diabetes Mother    Prostate cancer Father     ADVANCED DIRECTIVES (Y/N):  N  HEALTH MAINTENANCE: Social History   Tobacco Use   Smoking status: Some Days    Packs/day: 0.25    Years: 15.00    Total pack years: 3.75    Types: Cigarettes    Passive exposure: Current   Smokeless tobacco: Never  Vaping Use   Vaping Use: Never used  Substance  Use Topics   Alcohol use: Yes    Alcohol/week: 42.0 standard drinks of alcohol    Types: 42 Cans of beer per week    Comment: 3 every other day   Drug use: No     Colonoscopy:  PAP:  Bone density:  Lipid panel:  No Known Allergies  Current Outpatient Medications  Medication Sig Dispense Refill   acetaminophen (TYLENOL) 325 MG tablet Take 2 tablets (650 mg  total) by mouth every 6 (six) hours as needed for mild pain.     atorvastatin (LIPITOR) 40 MG tablet Take 1 tablet (40 mg total) by mouth daily. 30 tablet 0   feeding supplement (ENSURE ENLIVE / ENSURE PLUS) LIQD Take 237 mLs by mouth 3 (three) times daily between meals. 21330 mL 0   fentaNYL (DURAGESIC) 50 MCG/HR Place 1 patch onto the skin every 3 (three) days. 10 patch 0   folic acid (FOLVITE) 1 MG tablet Take 1 tablet (1 mg total) by mouth daily. 30 tablet 0   lidocaine-prilocaine (EMLA) cream Apply to affected area once 30 g 3   lidocaine-prilocaine (EMLA) cream Apply 1 application  topically as needed. Apply cream to port 1 hour prior to chemotherapy. Cover with plastic wrap. 30 g 3   Multiple Vitamins-Minerals (MULTIVITAMIN WITH MINERALS) tablet Take 1 tablet by mouth daily.     nicotine (NICODERM CQ - DOSED IN MG/24 HOURS) 21 mg/24hr patch Place 21 mg onto the skin daily.     omeprazole (PRILOSEC) 20 MG capsule Take 1 capsule (20 mg total) by mouth daily. 30 capsule 2   ondansetron (ZOFRAN) 8 MG tablet TAKE 1 TABLET 2 TIMES DAILY AS NEEDED FOR REFRACTORY NAUSEA / VOMITING. START DAY 3 AFTER CHEMO 60 tablet 2   Oxycodone HCl 10 MG TABS Take 0.5-1 tablets (5-10 mg total) by mouth every 4 (four) hours as needed. 60 tablet 0   polyethylene glycol (MIRALAX / GLYCOLAX) 17 g packet Take 17 g by mouth daily.     prochlorperazine (COMPAZINE) 10 MG tablet Take 1 tablet (10 mg total) by mouth every 6 (six) hours as needed (Nausea or vomiting). 60 tablet 2   sildenafil (VIAGRA) 25 MG tablet Take 1 tablet (25 mg total) by mouth daily as needed for erectile dysfunction. 10 tablet 0   sotalol (BETAPACE) 80 MG tablet Take 80 mg by mouth daily.     tamsulosin (FLOMAX) 0.4 MG CAPS capsule Take 2 capsules (0.8 mg total) by mouth daily. 30 capsule 11   thiamine 100 MG tablet Take 1 tablet (100 mg total) by mouth daily. 30 tablet 0   ferrous sulfate 325 (65 FE) MG tablet Take 1 tablet (325 mg total) by mouth  daily. 30 tablet 0   No current facility-administered medications for this visit.   Facility-Administered Medications Ordered in Other Visits  Medication Dose Route Frequency Provider Last Rate Last Admin   fluorouracil (ADRUCIL) 4,150 mg in sodium chloride 0.9 % 67 mL chemo infusion  2,400 mg/m2 (Treatment Plan Recorded) Intravenous 1 day or 1 dose Lloyd Huger, MD   Infusion Verify at 06/04/22 1406    OBJECTIVE: Vitals:   06/04/22 0914 06/04/22 0952  BP:  (!) 134/100  Pulse: 84   Resp: 18   Temp: 97.6 F (36.4 C)   SpO2: 99%      Body mass index is 17.36 kg/m.    ECOG FS:1 - Symptomatic but completely ambulatory  General: Well-developed, well-nourished, no acute distress. Eyes: Pink conjunctiva, anicteric sclera.  HEENT: Normocephalic, moist mucous membranes. Lungs: No audible wheezing or coughing. Heart: Regular rate and rhythm. Abdomen: Soft, nontender, no obvious distention. Musculoskeletal: No edema, cyanosis, or clubbing. Neuro: Alert, answering all questions appropriately. Cranial nerves grossly intact. Skin: No rashes or petechiae noted. Psych: Normal affect.  LAB RESULTS:  Lab Results  Component Value Date   NA 133 (L) 06/04/2022   K 4.0 06/04/2022   CL 100 06/04/2022   CO2 25 06/04/2022   GLUCOSE 123 (H) 06/04/2022   BUN 7 (L) 06/04/2022   CREATININE 0.56 (L) 06/04/2022   CALCIUM 9.1 06/04/2022   PROT 7.1 06/04/2022   ALBUMIN 3.1 (L) 06/04/2022   AST 35 06/04/2022   ALT 17 06/04/2022   ALKPHOS 83 06/04/2022   BILITOT 0.5 06/04/2022   GFRNONAA >60 06/04/2022   GFRAA >60 10/03/2016    Lab Results  Component Value Date   WBC 8.5 06/04/2022   NEUTROABS 4.2 06/04/2022   HGB 13.0 06/04/2022   HCT 39.3 06/04/2022   MCV 90.3 06/04/2022   PLT 293 06/04/2022     STUDIES: No results found.  ASSESSMENT: Stage IIa adenocarcinoma of the rectum.  PLAN:    Stage IIa adenocarcinoma of the rectum: CT scan results of chest, abdomen, and pelvis  reviewed independently and it does not appear patient has metastatic disease.  MRI of the pelvis completed on March 03, 2022 confirmed stage of disease.  CEA only mildly elevated at 6.7.  Patient has undergone diverting colostomy.  He would likely benefit from neoadjuvant chemotherapy using 8 cycles of FOLFOX followed by concurrent chemotherapy using Xeloda and XRT.  Patient will then require definitive surgery to remove any residual disease.  It is unclear if his colostomy is reversible at this time.  He has had port placed.  Proceed with cycle 6 of neoadjuvant FOLFOX today.  Return to clinic in 2 days for pump removal and then in 2 weeks for further evaluation and consideration of cycle 7.   Pain: Mostly related to his ostomy.  His back pain seems musculoskeletal.  Continue fentanyl to 50 mcg every 72 hours and only uses oxycodone sparingly.  He has been instructed to add Tylenol to his oxycodone dosing.  Appreciate palliative care input. Anemia: Resolved.  Patient's hemoglobin was 13.0 today.   Thrombocytopenia: Resolved. Hyponatremia: Sodium improved to 133, monitor. Mouth pain/poor dentition: Improved with antibiotics.  Patient understands he will require a dental appointment as soon as treatments are completed. Pain at ostomy site: Continue evaluation and monitoring per surgery.  Narcotic regimen as above. Hypokalemia: Resolved.   Hypomagnesia: Resolved. Neutropenia: Resolved.  Proceed with treatment as above.  Patient expressed understanding and was in agreement with this plan. He also understands that He can call clinic at any time with any questions, concerns, or complaints.    Cancer Staging  Adenocarcinoma of rectum [facility]) Staging form: Colon and Rectum, AJCC 8th Edition - Clinical stage from 03/10/2022: Stage IIA (cT3, cN0, cM0) - Signed by Lloyd Huger, MD on 03/10/2022 Stage prefix: Initial diagnosis Total positive nodes: 0  Lloyd Huger, MD   06/04/2022 2:28 PM

## 2022-06-04 ENCOUNTER — Telehealth: Payer: Self-pay | Admitting: Pharmacy Technician

## 2022-06-04 ENCOUNTER — Inpatient Hospital Stay: Payer: Medicare Other | Attending: Oncology

## 2022-06-04 ENCOUNTER — Inpatient Hospital Stay: Payer: Medicare Other

## 2022-06-04 ENCOUNTER — Encounter: Payer: Self-pay | Admitting: Oncology

## 2022-06-04 ENCOUNTER — Other Ambulatory Visit (HOSPITAL_COMMUNITY): Payer: Self-pay

## 2022-06-04 ENCOUNTER — Ambulatory Visit: Payer: Medicare Other | Admitting: Surgery

## 2022-06-04 ENCOUNTER — Inpatient Hospital Stay (HOSPITAL_BASED_OUTPATIENT_CLINIC_OR_DEPARTMENT_OTHER): Payer: Medicare Other | Admitting: Oncology

## 2022-06-04 VITALS — BP 134/100 | HR 84 | Temp 97.6°F | Resp 18 | Wt 128.0 lb

## 2022-06-04 DIAGNOSIS — Z5111 Encounter for antineoplastic chemotherapy: Secondary | ICD-10-CM | POA: Insufficient documentation

## 2022-06-04 DIAGNOSIS — C20 Malignant neoplasm of rectum: Secondary | ICD-10-CM | POA: Insufficient documentation

## 2022-06-04 DIAGNOSIS — Z79899 Other long term (current) drug therapy: Secondary | ICD-10-CM | POA: Diagnosis not present

## 2022-06-04 LAB — CBC WITH DIFFERENTIAL/PLATELET
Abs Immature Granulocytes: 0.04 10*3/uL (ref 0.00–0.07)
Basophils Absolute: 0.1 10*3/uL (ref 0.0–0.1)
Basophils Relative: 1 %
Eosinophils Absolute: 0.1 10*3/uL (ref 0.0–0.5)
Eosinophils Relative: 1 %
HCT: 39.3 % (ref 39.0–52.0)
Hemoglobin: 13 g/dL (ref 13.0–17.0)
Immature Granulocytes: 1 %
Lymphocytes Relative: 35 %
Lymphs Abs: 3 10*3/uL (ref 0.7–4.0)
MCH: 29.9 pg (ref 26.0–34.0)
MCHC: 33.1 g/dL (ref 30.0–36.0)
MCV: 90.3 fL (ref 80.0–100.0)
Monocytes Absolute: 1 10*3/uL (ref 0.1–1.0)
Monocytes Relative: 12 %
Neutro Abs: 4.2 10*3/uL (ref 1.7–7.7)
Neutrophils Relative %: 50 %
Platelets: 293 10*3/uL (ref 150–400)
RBC: 4.35 MIL/uL (ref 4.22–5.81)
RDW: 23.3 % — ABNORMAL HIGH (ref 11.5–15.5)
WBC: 8.5 10*3/uL (ref 4.0–10.5)
nRBC: 0 % (ref 0.0–0.2)

## 2022-06-04 LAB — COMPREHENSIVE METABOLIC PANEL
ALT: 17 U/L (ref 0–44)
AST: 35 U/L (ref 15–41)
Albumin: 3.1 g/dL — ABNORMAL LOW (ref 3.5–5.0)
Alkaline Phosphatase: 83 U/L (ref 38–126)
Anion gap: 8 (ref 5–15)
BUN: 7 mg/dL — ABNORMAL LOW (ref 8–23)
CO2: 25 mmol/L (ref 22–32)
Calcium: 9.1 mg/dL (ref 8.9–10.3)
Chloride: 100 mmol/L (ref 98–111)
Creatinine, Ser: 0.56 mg/dL — ABNORMAL LOW (ref 0.61–1.24)
GFR, Estimated: >60 mL/min (ref >60)
Glucose, Bld: 123 mg/dL — ABNORMAL HIGH (ref 70–99)
Potassium: 4 mmol/L (ref 3.5–5.1)
Sodium: 133 mmol/L — ABNORMAL LOW (ref 135–145)
Total Bilirubin: 0.5 mg/dL (ref 0.3–1.2)
Total Protein: 7.1 g/dL (ref 6.5–8.1)

## 2022-06-04 MED ORDER — FLUOROURACIL CHEMO INJECTION 2.5 GM/50ML
400.0000 mg/m2 | Freq: Once | INTRAVENOUS | Status: AC
  Administered 2022-06-04: 700 mg via INTRAVENOUS
  Filled 2022-06-04 (×2): qty 14

## 2022-06-04 MED ORDER — OXALIPLATIN CHEMO INJECTION 100 MG/20ML
85.0000 mg/m2 | Freq: Once | INTRAVENOUS | Status: AC
  Administered 2022-06-04: 145 mg via INTRAVENOUS
  Filled 2022-06-04: qty 20

## 2022-06-04 MED ORDER — PALONOSETRON HCL INJECTION 0.25 MG/5ML
0.2500 mg | Freq: Once | INTRAVENOUS | Status: AC
  Administered 2022-06-04: 0.25 mg via INTRAVENOUS
  Filled 2022-06-04: qty 5

## 2022-06-04 MED ORDER — SODIUM CHLORIDE 0.9 % IV SOLN
2400.0000 mg/m2 | INTRAVENOUS | Status: DC
  Administered 2022-06-04: 4150 mg via INTRAVENOUS
  Filled 2022-06-04: qty 83

## 2022-06-04 MED ORDER — DEXTROSE 5 % IV SOLN
Freq: Once | INTRAVENOUS | Status: AC
  Filled 2022-06-04: qty 250

## 2022-06-04 MED ORDER — LEUCOVORIN CALCIUM INJECTION 350 MG
700.0000 mg | Freq: Once | INTRAVENOUS | Status: AC
  Administered 2022-06-04: 700 mg via INTRAVENOUS
  Filled 2022-06-04: qty 35

## 2022-06-04 MED ORDER — SODIUM CHLORIDE 0.9 % IV SOLN
10.0000 mg | Freq: Once | INTRAVENOUS | Status: AC
  Administered 2022-06-04: 10 mg via INTRAVENOUS
  Filled 2022-06-04: qty 10

## 2022-06-04 NOTE — Telephone Encounter (Signed)
Oral Chemotherapy Pharmacist Encounter   With patient's high copay with his insurance, will offer will discounted pricing available through [facility] or [facility] [place]) pharmacy. [facility] would be the cheaper option.   Of note, patient will not be starting capecitabine until sometime in August with the start of radiation.    Darl Pikes, PharmD, BCPS, BCOP, CPP Hematology/Oncology Clinical Pharmacist [place]/DB/AP Oral [facility] [phone]  06/04/2022 4:22 PM

## 2022-06-04 NOTE — Telephone Encounter (Signed)
Oral Oncology Patient Advocate Encounter  After completing a benefits investigation, prior authorization for Capecitabine (Xeloda) is not required at this time through Medicare B.  Patient's copay is $773.43.     Lady Deutscher, CPhT-Adv Pharmacy Patient Advocate Specialist [place] Patient Advocate Team Direct Number: [phone]  Fax: [phone]

## 2022-06-04 NOTE — Patient Instructions (Signed)
[facility]  Discharge Instructions: Thank you for choosing [facility] to provide your oncology and hematology care.  If you have a lab appointment with the Cancer Center, please go directly to the Cancer Center and check in at the registration area.  Wear comfortable clothing and clothing appropriate for easy access to any Portacath or PICC line.   We strive to give you quality time with your provider. You may need to reschedule your appointment if you arrive late (15 or more minutes).  Arriving late affects you and other patients whose appointments are after yours.  Also, if you miss three or more appointments without notifying the office, you may be dismissed from the clinic at the provider's discretion.      For prescription refill requests, have your pharmacy contact our office and allow 72 hours for refills to be completed.    Today you received the following chemotherapy and/or immunotherapy agents Eloxatin, Leucovorin & Adrucil      To help prevent nausea and vomiting after your treatment, we encourage you to take your nausea medication as directed.  BELOW ARE SYMPTOMS THAT SHOULD BE REPORTED IMMEDIATELY: *FEVER GREATER THAN 100.4 F (38 C) OR HIGHER *CHILLS OR SWEATING *NAUSEA AND VOMITING THAT IS NOT CONTROLLED WITH YOUR NAUSEA MEDICATION *UNUSUAL SHORTNESS OF BREATH *UNUSUAL BRUISING OR BLEEDING *URINARY PROBLEMS (pain or burning when urinating, or frequent urination) *BOWEL PROBLEMS (unusual diarrhea, constipation, pain near the anus) TENDERNESS IN MOUTH AND THROAT WITH OR WITHOUT PRESENCE OF ULCERS (sore throat, sores in mouth, or a toothache) UNUSUAL RASH, SWELLING OR PAIN  UNUSUAL VAGINAL DISCHARGE OR ITCHING   Items with * indicate a potential emergency and should be followed up as soon as possible or go to the Emergency Department if any problems should occur.  Please show the CHEMOTHERAPY ALERT CARD or IMMUNOTHERAPY ALERT  CARD at check-in to the Emergency Department and triage nurse.  Should you have questions after your visit or need to cancel or reschedule your appointment, please contact [facility]  [phone] and follow the prompts.  Office hours are 8:00 a.m. to 4:30 p.m. Monday - Friday. Please note that voicemails left after 4:00 p.m. may not be returned until the following business day.  We are closed weekends and major holidays. You have access to a nurse at all times for urgent questions. Please call the main number to the clinic [phone] and follow the prompts.  For any non-urgent questions, you may also contact your provider using MyChart. We now offer e-Visits for anyone 18 and older to request care online for non-urgent symptoms. For details visit [email or web address].   Also download the MyChart app! Go to the app store, search "MyChart", open the app, select [facility], and log in with your MyChart username and password.  Masks are optional in the cancer centers. If you would like for your care team to wear a mask while they are taking care of you, please let them know. For doctor visits, patients may have with them one support person who is at least 65 years old. At this time, visitors are not allowed in the infusion area.   

## 2022-06-04 NOTE — Progress Notes (Signed)
Patient here today for follow up regarding rectal cancer.

## 2022-06-04 NOTE — Progress Notes (Signed)
Nutrition Follow-up:  Patient with stage II adenocarcinoma of rectum.  Patient receiving neoadjuvant folfox.    Met with patient during infusion.  Patient kept eyes closed during visit.  Says that he has no energy, pain medications have been adjusted.  Says that he informed MD about no energy and back pain.  Reports that he is eating sandwiches and foods easy to prepare.  Drinking ensure at least 1 per day if not more.  Sometimes drinks them if wakes during the night.      Medications: reviewed  Labs: reviewed  Anthropometrics:   Weight 128 lb today, increased  126 lb 12.8 oz on 6/14 125 lb on 5/17 124 lb on 5/5   NUTRITION DIAGNOSIS: Inadequate oral intake improving   INTERVENTION:  Discussed importance of good nutrition to help with fatigue.  Encouraged oral nutrition supplements    MONITORING, EVALUATION, GOAL: weight trends, intake   NEXT VISIT: ~ 6 weeks  Faylynn Stamos B. Zenia Resides, [place], [place] Registered Dietitian [phone]

## 2022-06-05 ENCOUNTER — Other Ambulatory Visit: Payer: Self-pay | Admitting: *Deleted

## 2022-06-05 ENCOUNTER — Ambulatory Visit: Payer: Medicare Other | Admitting: Urology

## 2022-06-05 MED ORDER — OXYCODONE HCL 10 MG PO TABS: 5.0000 mg | ORAL_TABLET | ORAL | 0 refills | Status: DC | PRN

## 2022-06-06 ENCOUNTER — Inpatient Hospital Stay: Payer: Medicare Other

## 2022-06-06 VITALS — BP 127/83 | HR 100 | Temp 96.9°F | Resp 16

## 2022-06-06 DIAGNOSIS — C20 Malignant neoplasm of rectum: Secondary | ICD-10-CM

## 2022-06-06 DIAGNOSIS — Z5111 Encounter for antineoplastic chemotherapy: Secondary | ICD-10-CM | POA: Diagnosis not present

## 2022-06-06 MED ORDER — HEPARIN SOD (PORK) LOCK FLUSH 100 UNIT/ML IV SOLN
500.0000 [IU] | Freq: Once | INTRAVENOUS | Status: AC | PRN
  Administered 2022-06-06: 500 [IU]
  Filled 2022-06-06: qty 5

## 2022-06-06 MED ORDER — SODIUM CHLORIDE 0.9% FLUSH
10.0000 mL | INTRAVENOUS | Status: DC | PRN
  Administered 2022-06-06: 10 mL
  Filled 2022-06-06: qty 10

## 2022-06-09 ENCOUNTER — Other Ambulatory Visit: Payer: Self-pay | Admitting: *Deleted

## 2022-06-09 MED ORDER — OMEPRAZOLE 20 MG PO CPDR: 20.0000 mg | DELAYED_RELEASE_CAPSULE | Freq: Every day | ORAL | 2 refills | Status: DC

## 2022-06-17 MED FILL — Dexamethasone Sodium Phosphate Inj 100 MG/10ML: INTRAMUSCULAR | Qty: 1 | Status: AC

## 2022-06-18 ENCOUNTER — Other Ambulatory Visit: Payer: Self-pay | Admitting: *Deleted

## 2022-06-18 ENCOUNTER — Ambulatory Visit: Payer: Medicare Other | Admitting: Pharmacist

## 2022-06-18 ENCOUNTER — Inpatient Hospital Stay: Payer: Medicare Other

## 2022-06-18 ENCOUNTER — Inpatient Hospital Stay (HOSPITAL_BASED_OUTPATIENT_CLINIC_OR_DEPARTMENT_OTHER): Payer: Medicare Other | Admitting: Oncology

## 2022-06-18 ENCOUNTER — Encounter: Payer: Self-pay | Admitting: Oncology

## 2022-06-18 VITALS — BP 116/89 | HR 78 | Temp 96.8°F | Resp 16 | Ht 72.0 in | Wt 122.4 lb

## 2022-06-18 DIAGNOSIS — C20 Malignant neoplasm of rectum: Secondary | ICD-10-CM

## 2022-06-18 DIAGNOSIS — Z5111 Encounter for antineoplastic chemotherapy: Secondary | ICD-10-CM | POA: Diagnosis not present

## 2022-06-18 LAB — CBC WITH DIFFERENTIAL/PLATELET
Abs Immature Granulocytes: 0.01 10*3/uL (ref 0.00–0.07)
Basophils Absolute: 0.1 10*3/uL (ref 0.0–0.1)
Basophils Relative: 1 %
Eosinophils Absolute: 0.6 10*3/uL — ABNORMAL HIGH (ref 0.0–0.5)
Eosinophils Relative: 10 %
HCT: 36.7 % — ABNORMAL LOW (ref 39.0–52.0)
Hemoglobin: 12.2 g/dL — ABNORMAL LOW (ref 13.0–17.0)
Immature Granulocytes: 0 %
Lymphocytes Relative: 48 %
Lymphs Abs: 2.7 10*3/uL (ref 0.7–4.0)
MCH: 30.5 pg (ref 26.0–34.0)
MCHC: 33.2 g/dL (ref 30.0–36.0)
MCV: 91.8 fL (ref 80.0–100.0)
Monocytes Absolute: 0.6 10*3/uL (ref 0.1–1.0)
Monocytes Relative: 10 %
Neutro Abs: 1.8 10*3/uL (ref 1.7–7.7)
Neutrophils Relative %: 31 %
Platelets: 152 10*3/uL (ref 150–400)
RBC: 4 MIL/uL — ABNORMAL LOW (ref 4.22–5.81)
RDW: 19.9 % — ABNORMAL HIGH (ref 11.5–15.5)
WBC: 5.8 10*3/uL (ref 4.0–10.5)
nRBC: 0 % (ref 0.0–0.2)

## 2022-06-18 LAB — COMPREHENSIVE METABOLIC PANEL
ALT: 26 U/L (ref 0–44)
AST: 49 U/L — ABNORMAL HIGH (ref 15–41)
Albumin: 3.2 g/dL — ABNORMAL LOW (ref 3.5–5.0)
Alkaline Phosphatase: 82 U/L (ref 38–126)
Anion gap: 6 (ref 5–15)
BUN: <5 mg/dL — ABNORMAL LOW (ref 8–23)
CO2: 26 mmol/L (ref 22–32)
Calcium: 9 mg/dL (ref 8.9–10.3)
Chloride: 102 mmol/L (ref 98–111)
Creatinine, Ser: 0.5 mg/dL — ABNORMAL LOW (ref 0.61–1.24)
GFR, Estimated: >60 mL/min (ref >60)
Glucose, Bld: 93 mg/dL (ref 70–99)
Potassium: 3.7 mmol/L (ref 3.5–5.1)
Sodium: 134 mmol/L — ABNORMAL LOW (ref 135–145)
Total Bilirubin: 0.5 mg/dL (ref 0.3–1.2)
Total Protein: 7 g/dL (ref 6.5–8.1)

## 2022-06-18 MED ORDER — DEXTROSE 5 % IV SOLN
Freq: Once | INTRAVENOUS | Status: AC
  Filled 2022-06-18: qty 250

## 2022-06-18 MED ORDER — AUGMENTIN 125-31.25 MG/5ML PO SUSR: 125.0000 mg | Freq: Three times a day (TID) | ORAL | 0 refills | Status: AC

## 2022-06-18 MED ORDER — OXALIPLATIN CHEMO INJECTION 100 MG/20ML
85.0000 mg/m2 | Freq: Once | INTRAVENOUS | Status: AC
  Administered 2022-06-18: 145 mg via INTRAVENOUS
  Filled 2022-06-18: qty 20

## 2022-06-18 MED ORDER — LEUCOVORIN CALCIUM INJECTION 350 MG
700.0000 mg | Freq: Once | INTRAVENOUS | Status: AC
  Administered 2022-06-18: 700 mg via INTRAVENOUS
  Filled 2022-06-18: qty 35

## 2022-06-18 MED ORDER — SODIUM CHLORIDE 0.9 % IV SOLN
10.0000 mg | Freq: Once | INTRAVENOUS | Status: AC
  Administered 2022-06-18: 10 mg via INTRAVENOUS
  Filled 2022-06-18: qty 10

## 2022-06-18 MED ORDER — PALONOSETRON HCL INJECTION 0.25 MG/5ML
0.2500 mg | Freq: Once | INTRAVENOUS | Status: AC
  Administered 2022-06-18: 0.25 mg via INTRAVENOUS
  Filled 2022-06-18: qty 5

## 2022-06-18 MED ORDER — SODIUM CHLORIDE 0.9 % IV SOLN
2400.0000 mg/m2 | INTRAVENOUS | Status: DC
  Administered 2022-06-18: 4150 mg via INTRAVENOUS
  Filled 2022-06-18: qty 83

## 2022-06-18 MED ORDER — FLUOROURACIL CHEMO INJECTION 2.5 GM/50ML
400.0000 mg/m2 | Freq: Once | INTRAVENOUS | Status: AC
  Administered 2022-06-18: 700 mg via INTRAVENOUS
  Filled 2022-06-18: qty 14

## 2022-06-18 NOTE — Telephone Encounter (Signed)
Pharmacy called asking that Augmentin prescription be resubmitted in a different strength due to the one ordered is the only one that does not have a generic formula.

## 2022-06-18 NOTE — Patient Instructions (Signed)
[facility]  Discharge Instructions: Thank you for choosing [facility] to provide your oncology and hematology care.  If you have a lab appointment with the Cancer Center, please go directly to the Cancer Center and check in at the registration area.  Wear comfortable clothing and clothing appropriate for easy access to any Portacath or PICC line.   We strive to give you quality time with your provider. You may need to reschedule your appointment if you arrive late (15 or more minutes).  Arriving late affects you and other patients whose appointments are after yours.  Also, if you miss three or more appointments without notifying the office, you may be dismissed from the clinic at the provider's discretion.      For prescription refill requests, have your pharmacy contact our office and allow 72 hours for refills to be completed.       To help prevent nausea and vomiting after your treatment, we encourage you to take your nausea medication as directed.  BELOW ARE SYMPTOMS THAT SHOULD BE REPORTED IMMEDIATELY: *FEVER GREATER THAN 100.4 F (38 C) OR HIGHER *CHILLS OR SWEATING *NAUSEA AND VOMITING THAT IS NOT CONTROLLED WITH YOUR NAUSEA MEDICATION *UNUSUAL SHORTNESS OF BREATH *UNUSUAL BRUISING OR BLEEDING *URINARY PROBLEMS (pain or burning when urinating, or frequent urination) *BOWEL PROBLEMS (unusual diarrhea, constipation, pain near the anus) TENDERNESS IN MOUTH AND THROAT WITH OR WITHOUT PRESENCE OF ULCERS (sore throat, sores in mouth, or a toothache) UNUSUAL RASH, SWELLING OR PAIN  UNUSUAL VAGINAL DISCHARGE OR ITCHING   Items with * indicate a potential emergency and should be followed up as soon as possible or go to the Emergency Department if any problems should occur.  Please show the CHEMOTHERAPY ALERT CARD or IMMUNOTHERAPY ALERT CARD at check-in to the Emergency Department and triage nurse.  Should you have questions after your  visit or need to cancel or reschedule your appointment, please contact [facility]  [phone] and follow the prompts.  Office hours are 8:00 a.m. to 4:30 p.m. Monday - Friday. Please note that voicemails left after 4:00 p.m. may not be returned until the following business day.  We are closed weekends and major holidays. You have access to a nurse at all times for urgent questions. Please call the main number to the clinic [phone] and follow the prompts.  For any non-urgent questions, you may also contact your provider using MyChart. We now offer e-Visits for anyone 18 and older to request care online for non-urgent symptoms. For details visit [email or web address].   Also download the MyChart app! Go to the app store, search "MyChart", open the app, select , and log in with your MyChart username and password.  Masks are optional in the cancer centers. If you would like for your care team to wear a mask while they are taking care of you, please let them know. For doctor visits, patients may have with them one support person who is at least 65 years old. At this time, visitors are not allowed in the infusion area.   

## 2022-06-18 NOTE — Progress Notes (Signed)
[place]  Telephone:(336) [phone] [phone]  ID: Kyle Larson OB: [birth year]  MR#: [id number]  PPJ#:[id number]  Patient Care Team: [name], MD as PCP - General (Cardiology) Clent Jacks, RN as Oncology Nurse Navigator Grayland Ormond, Kathlene November, MD as Consulting Physician (Oncology)  CHIEF COMPLAINT: Stage IIa adenocarcinoma of the rectum.  INTERVAL HISTORY: Patient returns to clinic today for further evaluation and consideration of cycle 7 of neoadjuvant FOLFOX.  He once again has increasing mouth pain.  He also has chronic weakness and fatigue as well as chronic back pain.  He has no neurologic complaints.  He denies any recent fevers or illnesses.  He has no chest pain, shortness of breath, cough, or hemoptysis.  He has no nausea, vomiting, constipation, or diarrhea.  He does not report any melena or hematochezia.  He has no urinary complaints.  Patient offers no further specific complaints today.  REVIEW OF SYSTEMS:   Review of Systems  Constitutional:  Positive for malaise/fatigue. Negative for fever and weight loss.  Respiratory: Negative.  Negative for cough, hemoptysis and shortness of breath.   Cardiovascular: Negative.  Negative for chest pain and leg swelling.  Gastrointestinal: Negative.  Negative for abdominal pain, blood in stool, constipation, diarrhea and melena.  Genitourinary: Negative.  Negative for dysuria.  Musculoskeletal:  Positive for back pain.  Skin: Negative.  Negative for rash.  Neurological:  Positive for weakness. Negative for dizziness, speech change, focal weakness and headaches.  Psychiatric/Behavioral: Negative.  The patient is not nervous/anxious.     As per HPI. Otherwise, a complete review of systems is negative.  PAST MEDICAL HISTORY: Past Medical History:  Diagnosis Date   AC joint dislocation    CAD (coronary artery disease)    CVA (cerebral vascular accident) ([place])    Metatarsal bone fracture    3rd  metatarsal , left foot; April 2021   Syncope    June '22   Tobacco abuse     PAST SURGICAL HISTORY: Past Surgical History:  Procedure Laterality Date   ACROMIO-CLAVICULAR JOINT REPAIR Left 09/11/2016   Procedure: ACROMIO-CLAVICULAR RECONSTRUCTION;  Surgeon: Corky Mull, MD;  Location: [facility];  Service: Orthopedics;  Laterality: Left;  clavicle   FLEXIBLE SIGMOIDOSCOPY N/A 02/24/2022   Procedure: FLEXIBLE SIGMOIDOSCOPY;  Surgeon: Lesly Rubenstein, MD;  Location: [facility];  Service: Endoscopy;  Laterality: N/A;   IR IMAGING GUIDED PORT INSERTION  03/10/2022   LEFT HEART CATH AND CORONARY ANGIOGRAPHY N/A 05/20/2021   Procedure: LEFT HEART CATH AND CORONARY ANGIOGRAPHY with coronary intervention;  Surgeon: [name], MD;  Location: [place] [facility];  Service: Cardiovascular;  Laterality: N/A;   MANDIBLE RECONSTRUCTION     TONSILLECTOMY     AGE 66   XI ROBOTIC ASSISTED INGUINAL HERNIA REPAIR WITH MESH Right 03/26/2020   Procedure: XI ROBOTIC ASSISTED INGUINAL HERNIA REPAIR WITH MESH;  Surgeon: Herbert Pun, MD;  Location: [facility];  Service: General;  Laterality: Right;    FAMILY HISTORY: Family History  Problem Relation Age of Onset   Diabetes Mother    Prostate cancer Father     ADVANCED DIRECTIVES (Y/N):  N  HEALTH MAINTENANCE: Social History   Tobacco Use   Smoking status: Some Days    Packs/day: 0.25    Years: 15.00    Total pack years: 3.75    Types: Cigarettes    Passive exposure: Current   Smokeless tobacco: Never  Vaping Use   Vaping Use: Never used  Substance  Use Topics   Alcohol use: Yes    Alcohol/week: 42.0 standard drinks of alcohol    Types: 42 Cans of beer per week    Comment: 3 every other day   Drug use: No     Colonoscopy:  PAP:  Bone density:  Lipid panel:  No Known Allergies  Current Outpatient Medications  Medication Sig Dispense Refill   acetaminophen (TYLENOL) 325 MG tablet Take 2 tablets (650 mg total) by mouth  every 6 (six) hours as needed for mild pain.     atorvastatin (LIPITOR) 40 MG tablet Take 1 tablet (40 mg total) by mouth daily. 30 tablet 0   feeding supplement (ENSURE ENLIVE / ENSURE PLUS) LIQD Take 237 mLs by mouth 3 (three) times daily between meals. 21330 mL 0   fentaNYL (DURAGESIC) 50 MCG/HR Place 1 patch onto the skin every 3 (three) days. 10 patch 0   folic acid (FOLVITE) 1 MG tablet Take 1 tablet (1 mg total) by mouth daily. 30 tablet 0   lidocaine-prilocaine (EMLA) cream Apply to affected area once 30 g 3   lidocaine-prilocaine (EMLA) cream Apply 1 application  topically as needed. Apply cream to port 1 hour prior to chemotherapy. Cover with plastic wrap. 30 g 3   Multiple Vitamins-Minerals (MULTIVITAMIN WITH MINERALS) tablet Take 1 tablet by mouth daily.     nicotine (NICODERM CQ - DOSED IN MG/24 HOURS) 21 mg/24hr patch Place 21 mg onto the skin daily.     omeprazole (PRILOSEC) 20 MG capsule Take 1 capsule (20 mg total) by mouth daily. 30 capsule 2   ondansetron (ZOFRAN) 8 MG tablet TAKE 1 TABLET 2 TIMES DAILY AS NEEDED FOR REFRACTORY NAUSEA / VOMITING. START DAY 3 AFTER CHEMO 60 tablet 2   Oxycodone HCl 10 MG TABS Take 0.5-1 tablets (5-10 mg total) by mouth every 4 (four) hours as needed. 60 tablet 0   polyethylene glycol (MIRALAX / GLYCOLAX) 17 g packet Take 17 g by mouth daily.     sildenafil (VIAGRA) 25 MG tablet Take 1 tablet (25 mg total) by mouth daily as needed for erectile dysfunction. 10 tablet 0   sotalol (BETAPACE) 80 MG tablet Take 80 mg by mouth daily.     tamsulosin (FLOMAX) 0.4 MG CAPS capsule Take 2 capsules (0.8 mg total) by mouth daily. 30 capsule 11   thiamine 100 MG tablet Take 1 tablet (100 mg total) by mouth daily. 30 tablet 0   amoxicillin-clavulanate (AUGMENTIN) 125-31.25 MG/5ML suspension Take 5 mLs (125 mg total) by mouth 3 (three) times daily for 7 days. 105 mL 0   amoxicillin-clavulanate (AUGMENTIN) 250-62.5 MG/5ML suspension Take 2.5 mLs (125 mg total) by  mouth 3 (three) times daily. 52.5 mL 0   ferrous sulfate 325 (65 FE) MG tablet Take 1 tablet (325 mg total) by mouth daily. 30 tablet 0   prochlorperazine (COMPAZINE) 10 MG tablet Take 1 tablet (10 mg total) by mouth every 6 (six) hours as needed (Nausea or vomiting). (Patient not taking: Reported on 06/18/2022) 60 tablet 2   No current facility-administered medications for this visit.    OBJECTIVE: Vitals:   06/18/22 0954  BP: 116/89  Pulse: 78  Resp: 16  Temp: (!) 96.8 F (36 C)  SpO2: 100%     Body mass index is 16.6 kg/m.    ECOG FS:1 - Symptomatic but completely ambulatory  General: Well-developed, well-nourished, no acute distress. Eyes: Pink conjunctiva, anicteric sclera. HEENT: Normocephalic, moist mucous membranes. Lungs: No audible wheezing or  coughing. Heart: Regular rate and rhythm. Abdomen: Soft, nontender, no obvious distention. Musculoskeletal: No edema, cyanosis, or clubbing. Neuro: Alert, answering all questions appropriately. Cranial nerves grossly intact. Skin: No rashes or petechiae noted. Psych: Normal affect.   LAB RESULTS:  Lab Results  Component Value Date   NA 134 (L) 06/18/2022   K 3.7 06/18/2022   CL 102 06/18/2022   CO2 26 06/18/2022   GLUCOSE 93 06/18/2022   BUN <5 (L) 06/18/2022   CREATININE 0.50 (L) 06/18/2022   CALCIUM 9.0 06/18/2022   PROT 7.0 06/18/2022   ALBUMIN 3.2 (L) 06/18/2022   AST 49 (H) 06/18/2022   ALT 26 06/18/2022   ALKPHOS 82 06/18/2022   BILITOT 0.5 06/18/2022   GFRNONAA >60 06/18/2022   GFRAA >60 10/03/2016    Lab Results  Component Value Date   WBC 5.8 06/18/2022   NEUTROABS 1.8 06/18/2022   HGB 12.2 (L) 06/18/2022   HCT 36.7 (L) 06/18/2022   MCV 91.8 06/18/2022   PLT 152 06/18/2022     STUDIES: No results found.  ASSESSMENT: Stage IIa adenocarcinoma of the rectum.  PLAN:    Stage IIa adenocarcinoma of the rectum: CT scan results of chest, abdomen, and pelvis reviewed independently and it does not  appear patient has metastatic disease.  MRI of the pelvis completed on March 03, 2022 confirmed stage of disease.  CEA only mildly elevated at 6.7.  Patient has undergone diverting colostomy.  He would likely benefit from neoadjuvant chemotherapy using 8 cycles of FOLFOX followed by concurrent chemotherapy using Xeloda and XRT.  Patient will then require definitive surgery to remove any residual disease.  It is unclear if his colostomy is reversible at this time.  He has had port placed.  Proceed with cycle 7 of neoadjuvant FOLFOX today.  Return to clinic in 2 days for pump removal and then in 2 weeks for further evaluation and consideration of cycle 8.     Pain: Chronic and unchanged.  Mostly related to his ostomy.  His back pain seems musculoskeletal.  Continue fentanyl to 50 mcg every 72 hours and only uses oxycodone and Tylenol sparingly.  Appreciate palliative care input. Anemia: Essentially resolved.  Patient's hemoglobin is 12.2. Thrombocytopenia: Resolved. Hyponatremia: Chronic and unchanged.  Patient's sodium is 134 today. Mouth pain/poor dentition: Previously improved with antibiotics, patient was given a refill of his liquid Augmentin.  Patient understands he will require a dental appointment as soon as treatments are completed. Pain at ostomy site: Continue evaluation and monitoring per surgery.  Narcotic regimen as above. Hypokalemia: Resolved.   Hypomagnesia: Resolved. Neutropenia: Resolved.  Proceed with treatment as above.  Patient expressed understanding and was in agreement with this plan. He also understands that He can call clinic at any time with any questions, concerns, or complaints.    Cancer Staging  Adenocarcinoma of rectum [facility]) Staging form: Colon and Rectum, AJCC 8th Edition - Clinical stage from 03/10/2022: Stage IIA (cT3, cN0, cM0) - Signed by Lloyd Huger, MD on 03/10/2022 Stage prefix: Initial diagnosis Total positive nodes: 0  Lloyd Huger, MD    06/20/2022 6:42 AM

## 2022-06-19 ENCOUNTER — Other Ambulatory Visit: Payer: Self-pay

## 2022-06-19 ENCOUNTER — Encounter: Payer: Self-pay | Admitting: Oncology

## 2022-06-19 MED ORDER — AMOXICILLIN-POT CLAVULANATE 250-62.5 MG/5ML PO SUSR: 125.0000 mg | Freq: Three times a day (TID) | ORAL | 0 refills | Status: DC

## 2022-06-19 NOTE — Telephone Encounter (Signed)
Spoke with pharmacy. They have the   amoxicillin-clavulanate (AUGMENTIN) 250-62.5 MG/5ML suspension  I will go ahead and send in.

## 2022-06-20 ENCOUNTER — Encounter: Payer: Self-pay | Admitting: Oncology

## 2022-06-20 ENCOUNTER — Other Ambulatory Visit: Payer: Self-pay | Admitting: *Deleted

## 2022-06-20 ENCOUNTER — Inpatient Hospital Stay: Payer: Medicare Other

## 2022-06-20 VITALS — BP 106/82

## 2022-06-20 DIAGNOSIS — C20 Malignant neoplasm of rectum: Secondary | ICD-10-CM

## 2022-06-20 DIAGNOSIS — Z5111 Encounter for antineoplastic chemotherapy: Secondary | ICD-10-CM | POA: Diagnosis not present

## 2022-06-20 MED ORDER — HEPARIN SOD (PORK) LOCK FLUSH 100 UNIT/ML IV SOLN
500.0000 [IU] | Freq: Once | INTRAVENOUS | Status: AC | PRN
  Administered 2022-06-20: 500 [IU]
  Filled 2022-06-20: qty 5

## 2022-06-20 MED ORDER — OXYCODONE HCL 10 MG PO TABS: 5.0000 mg | ORAL_TABLET | ORAL | 0 refills | Status: DC | PRN

## 2022-06-20 MED ORDER — SODIUM CHLORIDE 0.9% FLUSH
10.0000 mL | INTRAVENOUS | Status: DC | PRN
  Administered 2022-06-20: 10 mL
  Filled 2022-06-20: qty 10

## 2022-06-26 NOTE — Progress Notes (Signed)
[place]  [phone] [phone]  ID: Kyle Larson OB: [birth year]  MR#: [id number]  LZJ#:[id number]  Patient Care Team: [name], MD as PCP - General (Cardiology) Clent Jacks, RN as Oncology Nurse Navigator Grayland Ormond, Kathlene November, MD as Consulting Physician (Oncology)  CHIEF COMPLAINT: Stage IIa adenocarcinoma of the rectum.  INTERVAL HISTORY: Patient returns to clinic today for further evaluation and consideration of cycle 8 of neoadjuvant FOLFOX.  He continues to have increasing mouth pain and difficulty eating.  He has chronic weakness and fatigue.  He also has occasional insomnia.  He has no neurologic complaints.  He denies any recent fevers or illnesses.  He has no chest pain, shortness of breath, cough, or hemoptysis.  He has no nausea, vomiting, constipation, or diarrhea.  He does not report any melena or hematochezia.  He has no urinary complaints.  Patient offers no further specific complaints today.  REVIEW OF SYSTEMS:   Review of Systems  Constitutional:  Positive for malaise/fatigue. Negative for fever and weight loss.  Respiratory: Negative.  Negative for cough, hemoptysis and shortness of breath.   Cardiovascular: Negative.  Negative for chest pain and leg swelling.  Gastrointestinal: Negative.  Negative for abdominal pain, blood in stool, constipation, diarrhea and melena.  Genitourinary: Negative.  Negative for dysuria.  Musculoskeletal:  Positive for back pain.  Skin: Negative.  Negative for rash.  Neurological:  Positive for weakness. Negative for dizziness, speech change, focal weakness and headaches.  Psychiatric/Behavioral:  The patient has insomnia. The patient is not nervous/anxious.     As per HPI. Otherwise, a complete review of systems is negative.  PAST MEDICAL HISTORY: Past Medical History:  Diagnosis Date   AC joint dislocation    CAD (coronary artery disease)    CVA (cerebral vascular accident)  ([place])    Metatarsal bone fracture    3rd metatarsal , left foot; April 2021   Syncope    June '22   Tobacco abuse     PAST SURGICAL HISTORY: Past Surgical History:  Procedure Laterality Date   ACROMIO-CLAVICULAR JOINT REPAIR Left 09/11/2016   Procedure: ACROMIO-CLAVICULAR RECONSTRUCTION;  Surgeon: Corky Mull, MD;  Location: [facility] ORS;  Service: Orthopedics;  Laterality: Left;  clavicle   FLEXIBLE SIGMOIDOSCOPY N/A 02/24/2022   Procedure: FLEXIBLE SIGMOIDOSCOPY;  Surgeon: Lesly Rubenstein, MD;  Location: [facility] ENDOSCOPY;  Service: Endoscopy;  Laterality: N/A;   IR IMAGING GUIDED PORT INSERTION  03/10/2022   LEFT HEART CATH AND CORONARY ANGIOGRAPHY N/A 05/20/2021   Procedure: LEFT HEART CATH AND CORONARY ANGIOGRAPHY with coronary intervention;  Surgeon: [name], MD;  Location: [place] [facility];  Service: Cardiovascular;  Laterality: N/A;   MANDIBLE RECONSTRUCTION     TONSILLECTOMY     AGE 80   XI ROBOTIC ASSISTED INGUINAL HERNIA REPAIR WITH MESH Right 03/26/2020   Procedure: XI ROBOTIC ASSISTED INGUINAL HERNIA REPAIR WITH MESH;  Surgeon: Herbert Pun, MD;  Location: [facility] ORS;  Service: General;  Laterality: Right;    FAMILY HISTORY: Family History  Problem Relation Age of Onset   Diabetes Mother    Prostate cancer Father     ADVANCED DIRECTIVES (Y/N):  N  HEALTH MAINTENANCE: Social History   Tobacco Use   Smoking status: Some Days    Packs/day: 0.25    Years: 15.00    Total pack years: 3.75    Types: Cigarettes    Passive exposure: Current   Smokeless tobacco: Never  Vaping Use   Vaping  Use: Never used  Substance Use Topics   Alcohol use: Yes    Alcohol/week: 42.0 standard drinks of alcohol    Types: 42 Cans of beer per week    Comment: 3 every other day   Drug use: No     Colonoscopy:  PAP:  Bone density:  Lipid panel:  No Known Allergies  Current Outpatient Medications  Medication Sig Dispense Refill   acetaminophen (TYLENOL) 325 MG  tablet Take 2 tablets (650 mg total) by mouth every 6 (six) hours as needed for mild pain.     atorvastatin (LIPITOR) 40 MG tablet Take 1 tablet (40 mg total) by mouth daily. 30 tablet 0   doxycycline (VIBRA-TABS) 100 MG tablet Take 1 tablet (100 mg total) by mouth 3 (three) times daily. 21 tablet 0   feeding supplement (ENSURE ENLIVE / ENSURE PLUS) LIQD Take 237 mLs by mouth 3 (three) times daily between meals. 21330 mL 0   fentaNYL (DURAGESIC) 50 MCG/HR Place 1 patch onto the skin every 3 (three) days. 10 patch 0   folic acid (FOLVITE) 1 MG tablet Take 1 tablet (1 mg total) by mouth daily. 30 tablet 0   lidocaine-prilocaine (EMLA) cream Apply to affected area once 30 g 3   lidocaine-prilocaine (EMLA) cream Apply 1 application  topically as needed. Apply cream to port 1 hour prior to chemotherapy. Cover with plastic wrap. 30 g 3   Multiple Vitamins-Minerals (MULTIVITAMIN WITH MINERALS) tablet Take 1 tablet by mouth daily.     nicotine (NICODERM CQ - DOSED IN MG/24 HOURS) 21 mg/24hr patch Place 21 mg onto the skin daily.     ondansetron (ZOFRAN) 8 MG tablet TAKE 1 TABLET 2 TIMES DAILY AS NEEDED FOR REFRACTORY NAUSEA / VOMITING. START DAY 3 AFTER CHEMO 60 tablet 2   Oxycodone HCl 10 MG TABS Take 0.5-1 tablets (5-10 mg total) by mouth every 4 (four) hours as needed. 60 tablet 0   polyethylene glycol (MIRALAX / GLYCOLAX) 17 g packet Take 17 g by mouth daily.     sildenafil (VIAGRA) 25 MG tablet Take 1 tablet (25 mg total) by mouth daily as needed for erectile dysfunction. 10 tablet 0   sotalol (BETAPACE) 80 MG tablet Take 80 mg by mouth daily.     tamsulosin (FLOMAX) 0.4 MG CAPS capsule Take 2 capsules (0.8 mg total) by mouth daily. 30 capsule 11   thiamine 100 MG tablet Take 1 tablet (100 mg total) by mouth daily. 30 tablet 0   ALPRAZolam (XANAX) 0.25 MG tablet Take 1 tablet (0.25 mg total) by mouth at bedtime as needed for sleep. 30 tablet 0   amoxicillin-clavulanate (AUGMENTIN) 250-62.5 MG/5ML  suspension Take 2.5 mLs (125 mg total) by mouth 3 (three) times daily. (Patient not taking: Reported on 07/02/2022) 52.5 mL 0   ferrous sulfate 325 (65 FE) MG tablet Take 1 tablet (325 mg total) by mouth daily. 30 tablet 0   omeprazole (PRILOSEC) 20 MG capsule Take 1 capsule (20 mg total) by mouth daily. 30 capsule 2   prochlorperazine (COMPAZINE) 10 MG tablet Take 1 tablet (10 mg total) by mouth every 6 (six) hours as needed (Nausea or vomiting). (Patient not taking: Reported on 06/18/2022) 60 tablet 2   No current facility-administered medications for this visit.    OBJECTIVE: Vitals:   07/02/22 0830  BP: (!) 122/93  Pulse: 80  Resp: 16  Temp: (!) 96.4 F (35.8 C)  SpO2: 99%     Body mass index is 16.45  kg/m.    ECOG FS:1 - Symptomatic but completely ambulatory  General: Well-developed, well-nourished, no acute distress. Eyes: Pink conjunctiva, anicteric sclera. HEENT: Normocephalic, moist mucous membranes. Lungs: No audible wheezing or coughing. Heart: Regular rate and rhythm. Abdomen: Soft, nontender, no obvious distention.  Colostomy noted. Musculoskeletal: No edema, cyanosis, or clubbing. Neuro: Alert, answering all questions appropriately. Cranial nerves grossly intact. Skin: No rashes or petechiae noted. Psych: Normal affect.   LAB RESULTS:  Lab Results  Component Value Date   NA 134 (L) 07/02/2022   K 3.1 (L) 07/02/2022   CL 102 07/02/2022   CO2 25 07/02/2022   GLUCOSE 127 (H) 07/02/2022   BUN <5 (L) 07/02/2022   CREATININE 0.56 (L) 07/02/2022   CALCIUM 9.1 07/02/2022   PROT 7.0 07/02/2022   ALBUMIN 3.2 (L) 07/02/2022   AST 34 07/02/2022   ALT 18 07/02/2022   ALKPHOS 66 07/02/2022   BILITOT 0.4 07/02/2022   GFRNONAA >60 07/02/2022   GFRAA >60 10/03/2016    Lab Results  Component Value Date   WBC 4.5 07/02/2022   NEUTROABS 1.1 (L) 07/02/2022   HGB 12.4 (L) 07/02/2022   HCT 36.2 (L) 07/02/2022   MCV 91.2 07/02/2022   PLT 64 (L) 07/02/2022      STUDIES: No results found.  ASSESSMENT: Stage IIa adenocarcinoma of the rectum.  PLAN:    Stage IIa adenocarcinoma of the rectum: CT scan results of chest, abdomen, and pelvis reviewed independently and it does not appear patient has metastatic disease.  MRI of the pelvis completed on March 03, 2022 confirmed stage of disease.  CEA only mildly elevated at 6.7.  Patient has undergone diverting colostomy.  He would likely benefit from neoadjuvant chemotherapy using 8 cycles of FOLFOX followed by concurrent chemotherapy using Xeloda and XRT.  Patient will then require definitive surgery to remove any residual disease.  It is unclear if his colostomy is reversible at this time.  He has had port placed.  Delay cycle 8 of treatment secondary to thrombocytopenia.  Return to clinic in 1 week further evaluation and reconsideration of cycle 8.   Pain: Chronic and unchanged.  Mostly related to his ostomy.  His back pain seems musculoskeletal.  Continue fentanyl to 50 mcg every 72 hours and only uses oxycodone and Tylenol sparingly.  Appreciate palliative care input. Anemia: Essentially resolved.  Patient's hemoglobin is 12.4. Thrombocytopenia: Delay treatment as above.  Patient platelet count is 64. Hyponatremia: Chronic and unchanged.  Patient's sodium is 134 today. Mouth pain/poor dentition: Previously improved with antibiotics, but patient reports Augmentin was not helpful therefore he was given a different prescription for doxycycline.  Patient understands he will require a dental appointment as soon as treatments are completed. Pain at ostomy site: Continue evaluation and monitoring per surgery.  Narcotic regimen as above. Hypokalemia: Patient's potassium is 3.1 today.  Proceed with 20 mEQ IV potassium along with 1 L IV fluids. Hypomagnesia: Resolved. Neutropenia: Resolved.   Patient expressed understanding and was in agreement with this plan. He also understands that He can call clinic at any  time with any questions, concerns, or complaints.    Cancer Staging  Adenocarcinoma of rectum [facility]) Staging form: Colon and Rectum, AJCC 8th Edition - Clinical stage from 03/10/2022: Stage IIA (cT3, cN0, cM0) - Signed by Lloyd Huger, MD on 03/10/2022 Stage prefix: Initial diagnosis Total positive nodes: 0  Lloyd Huger, MD   07/03/2022 6:38 AM

## 2022-06-27 ENCOUNTER — Other Ambulatory Visit: Payer: Self-pay | Admitting: *Deleted

## 2022-06-27 MED ORDER — FENTANYL 50 MCG/HR TD PT72
1.0000 | MEDICATED_PATCH | TRANSDERMAL | 0 refills | Status: DC
Start: 2022-06-27 — End: 2022-07-07

## 2022-06-30 ENCOUNTER — Telehealth: Payer: Self-pay | Admitting: *Deleted

## 2022-06-30 NOTE — Telephone Encounter (Signed)
Confirmation #:[id number] W Pa submitted via nctracks

## 2022-06-30 NOTE — Telephone Encounter (Signed)
-----   Message from Secundino Ginger sent at 06/30/2022  8:01 AM EDT ----- Regarding: PRIOR AUTHORIZATION-  [facility] PRIOR AUTHORIZATION-  [facility] SENT TO HIS CHART.

## 2022-07-01 MED FILL — Dexamethasone Sodium Phosphate Inj 100 MG/10ML: INTRAMUSCULAR | Qty: 1 | Status: AC

## 2022-07-02 ENCOUNTER — Encounter: Payer: Self-pay | Admitting: Oncology

## 2022-07-02 ENCOUNTER — Inpatient Hospital Stay: Payer: Medicare Other | Attending: Oncology

## 2022-07-02 ENCOUNTER — Other Ambulatory Visit: Payer: Self-pay

## 2022-07-02 ENCOUNTER — Inpatient Hospital Stay (HOSPITAL_BASED_OUTPATIENT_CLINIC_OR_DEPARTMENT_OTHER): Payer: Medicare Other | Admitting: Oncology

## 2022-07-02 ENCOUNTER — Telehealth: Payer: Self-pay | Admitting: *Deleted

## 2022-07-02 ENCOUNTER — Inpatient Hospital Stay: Payer: Medicare Other

## 2022-07-02 VITALS — BP 122/93 | HR 80 | Temp 96.4°F | Resp 16 | Ht 72.0 in | Wt 121.3 lb

## 2022-07-02 DIAGNOSIS — C20 Malignant neoplasm of rectum: Secondary | ICD-10-CM

## 2022-07-02 DIAGNOSIS — E876 Hypokalemia: Secondary | ICD-10-CM | POA: Insufficient documentation

## 2022-07-02 DIAGNOSIS — Z5111 Encounter for antineoplastic chemotherapy: Secondary | ICD-10-CM | POA: Diagnosis not present

## 2022-07-02 DIAGNOSIS — Z5189 Encounter for other specified aftercare: Secondary | ICD-10-CM | POA: Diagnosis not present

## 2022-07-02 DIAGNOSIS — Z79899 Other long term (current) drug therapy: Secondary | ICD-10-CM | POA: Diagnosis not present

## 2022-07-02 LAB — CBC WITH DIFFERENTIAL/PLATELET
Abs Immature Granulocytes: 0.01 K/uL (ref 0.00–0.07)
Basophils Absolute: 0.1 K/uL (ref 0.0–0.1)
Basophils Relative: 1 %
Eosinophils Absolute: 0.1 K/uL (ref 0.0–0.5)
Eosinophils Relative: 3 %
HCT: 36.2 % — ABNORMAL LOW (ref 39.0–52.0)
Hemoglobin: 12.4 g/dL — ABNORMAL LOW (ref 13.0–17.0)
Immature Granulocytes: 0 %
Lymphocytes Relative: 59 %
Lymphs Abs: 2.7 K/uL (ref 0.7–4.0)
MCH: 31.2 pg (ref 26.0–34.0)
MCHC: 34.3 g/dL (ref 30.0–36.0)
MCV: 91.2 fL (ref 80.0–100.0)
Monocytes Absolute: 0.6 K/uL (ref 0.1–1.0)
Monocytes Relative: 13 %
Neutro Abs: 1.1 K/uL — ABNORMAL LOW (ref 1.7–7.7)
Neutrophils Relative %: 24 %
Platelets: 64 K/uL — ABNORMAL LOW (ref 150–400)
RBC: 3.97 MIL/uL — ABNORMAL LOW (ref 4.22–5.81)
RDW: 19 % — ABNORMAL HIGH (ref 11.5–15.5)
WBC: 4.5 K/uL (ref 4.0–10.5)
nRBC: 0 % (ref 0.0–0.2)

## 2022-07-02 LAB — COMPREHENSIVE METABOLIC PANEL WITH GFR
ALT: 18 U/L (ref 0–44)
AST: 34 U/L (ref 15–41)
Albumin: 3.2 g/dL — ABNORMAL LOW (ref 3.5–5.0)
Alkaline Phosphatase: 66 U/L (ref 38–126)
Anion gap: 7 (ref 5–15)
BUN: <5 mg/dL — ABNORMAL LOW (ref 8–23)
CO2: 25 mmol/L (ref 22–32)
Calcium: 9.1 mg/dL (ref 8.9–10.3)
Chloride: 102 mmol/L (ref 98–111)
Creatinine, Ser: 0.56 mg/dL — ABNORMAL LOW (ref 0.61–1.24)
GFR, Estimated: >60 mL/min (ref >60)
Glucose, Bld: 127 mg/dL — ABNORMAL HIGH (ref 70–99)
Potassium: 3.1 mmol/L — ABNORMAL LOW (ref 3.5–5.1)
Sodium: 134 mmol/L — ABNORMAL LOW (ref 135–145)
Total Bilirubin: 0.4 mg/dL (ref 0.3–1.2)
Total Protein: 7 g/dL (ref 6.5–8.1)

## 2022-07-02 MED ORDER — ALPRAZOLAM 0.25 MG PO TABS: 0.2500 mg | ORAL_TABLET | Freq: Every evening | ORAL | 0 refills | Status: DC | PRN

## 2022-07-02 MED ORDER — OMEPRAZOLE 20 MG PO CPDR: 20.0000 mg | DELAYED_RELEASE_CAPSULE | Freq: Every day | ORAL | 2 refills | Status: DC

## 2022-07-02 MED ORDER — DOXYCYCLINE HYCLATE 100 MG PO TABS: 100.0000 mg | ORAL_TABLET | Freq: Three times a day (TID) | ORAL | 0 refills | Status: DC

## 2022-07-02 MED ORDER — POTASSIUM CHLORIDE IN NACL 20-0.9 MEQ/L-% IV SOLN
Freq: Once | INTRAVENOUS | Status: AC
  Filled 2022-07-02: qty 1000

## 2022-07-02 MED ORDER — HEPARIN SOD (PORK) LOCK FLUSH 100 UNIT/ML IV SOLN
500.0000 [IU] | Freq: Once | INTRAVENOUS | Status: AC | PRN
  Administered 2022-07-02: 500 [IU]
  Filled 2022-07-02: qty 5

## 2022-07-02 NOTE — Progress Notes (Signed)
Per MD no chemo today. K 3.1, patient tolerated 1 L IVF KCL infusion well, no questions/concerns voiced. Patient stable at discharge. AVS given.

## 2022-07-02 NOTE — Telephone Encounter (Signed)
Reached out to [facility] and spoke to pharmacist to confirm Per Dr. Grayland Ormond rx was sent incorrectly. Pt is to take doxycycline BID for 7 days. Pharmacist changed rx.

## 2022-07-02 NOTE — Telephone Encounter (Signed)
Pharmacist from [facility] in [place] called to check on dosing of Doxycycline being three times a day when usual dose is twice a day. I see that patient had poor dentition and has been on antibiotics for that and that patient is getting chemotherapy today. Please explain why the dose is more than normal dose for this patient.

## 2022-07-02 NOTE — Patient Instructions (Signed)
[facility] AT [place]  Discharge Instructions: Thank you for choosing [facility] to provide your oncology and hematology care.  If you have a lab appointment with the [facility], please go directly to the [facility] and check in at the registration area.  Wear comfortable clothing and clothing appropriate for easy access to any Portacath or PICC line.   We strive to give you quality time with your provider. You may need to reschedule your appointment if you arrive late (15 or more minutes).  Arriving late affects you and other patients whose appointments are after yours.  Also, if you miss three or more appointments without notifying the office, you may be dismissed from the clinic at the provider's discretion.      For prescription refill requests, have your pharmacy contact our office and allow 72 hours for refills to be completed.    Today you received : Potassium   To help prevent nausea and vomiting after your treatment, we encourage you to take your nausea medication as directed.  BELOW ARE SYMPTOMS THAT SHOULD BE REPORTED IMMEDIATELY: *FEVER GREATER THAN 100.4 F (38 C) OR HIGHER *CHILLS OR SWEATING *NAUSEA AND VOMITING THAT IS NOT CONTROLLED WITH YOUR NAUSEA MEDICATION *UNUSUAL SHORTNESS OF BREATH *UNUSUAL BRUISING OR BLEEDING *URINARY PROBLEMS (pain or burning when urinating, or frequent urination) *BOWEL PROBLEMS (unusual diarrhea, constipation, pain near the anus) TENDERNESS IN MOUTH AND THROAT WITH OR WITHOUT PRESENCE OF ULCERS (sore throat, sores in mouth, or a toothache) UNUSUAL RASH, SWELLING OR PAIN  UNUSUAL VAGINAL DISCHARGE OR ITCHING   Items with * indicate a potential emergency and should be followed up as soon as possible or go to the Emergency Department if any problems should occur.  Please show the CHEMOTHERAPY ALERT CARD or IMMUNOTHERAPY ALERT CARD at check-in to the Emergency Department and triage nurse.  Should you  have questions after your visit or need to cancel or reschedule your appointment, please contact [facility] [place] [facility] [place]  [phone] and follow the prompts.  Office hours are 8:00 a.m. to 4:30 p.m. Monday - Friday. Please note that voicemails left after 4:00 p.m. may not be returned until the following business day.  We are closed weekends and major holidays. You have access to a nurse at all times for urgent questions. Please call the main number to the clinic [phone] and follow the prompts.  For any non-urgent questions, you may also contact your provider using MyChart. We now offer e-Visits for anyone 61 and older to request care online for non-urgent symptoms. For details visit [email or web address].   Also download the MyChart app! Go to the app store, search "MyChart", open the app, select [facility], and log in with your MyChart username and password.  Masks are optional in the cancer centers. If you would like for your care team to wear a mask while they are taking care of you, please let them know. For doctor visits, patients may have with them one support person who is at least 65 years old. At this time, visitors are not allowed in the infusion area.

## 2022-07-03 ENCOUNTER — Encounter: Payer: Self-pay | Admitting: Oncology

## 2022-07-04 ENCOUNTER — Inpatient Hospital Stay: Payer: Medicare Other

## 2022-07-05 NOTE — Progress Notes (Unsigned)
Kyle Larson  [phone] [phone]) [phone]  ID: Kyle Larson OB: Jul 17, 1956  MR#: [id number]  YKD#:[id number]  Patient Care Team: [name], MD as PCP - General (Cardiology) Clent Jacks, RN as Oncology Nurse Navigator Grayland Ormond, Kathlene November, MD as Consulting Physician (Oncology)  CHIEF COMPLAINT: Stage IIa adenocarcinoma of the rectum.  INTERVAL HISTORY: Patient returns to clinic today for further evaluation and reconsideration of cycle 8 of 8 of neoadjuvant FOLFOX.  He continues to have mouth pain despite antibiotics.  He has chronic weakness and fatigue.  He also has occasional insomnia.  He has no neurologic complaints.  He denies any recent fevers or illnesses.  He has no chest pain, shortness of breath, cough, or hemoptysis.  He has no nausea, vomiting, constipation, or diarrhea.  He does not report any melena or hematochezia.  He has no urinary complaints.  Patient offers no further specific complaints today.  REVIEW OF SYSTEMS:   Review of Systems  Constitutional:  Positive for malaise/fatigue. Negative for fever and weight loss.  Respiratory: Negative.  Negative for cough, hemoptysis and shortness of breath.   Cardiovascular: Negative.  Negative for chest pain and leg swelling.  Gastrointestinal: Negative.  Negative for abdominal pain, blood in stool, constipation, diarrhea and melena.  Genitourinary: Negative.  Negative for dysuria.  Musculoskeletal:  Positive for back pain.  Skin: Negative.  Negative for rash.  Neurological:  Positive for weakness. Negative for dizziness, speech change, focal weakness and headaches.  Psychiatric/Behavioral:  The patient has insomnia. The patient is not nervous/anxious.     As per HPI. Otherwise, a complete review of systems is negative.  PAST MEDICAL HISTORY: Past Medical History:  Diagnosis Date   AC joint dislocation    CAD (coronary artery disease)    CVA (cerebral vascular accident) ([place])     Metatarsal bone fracture    3rd metatarsal , left foot; April 2021   Syncope    June '22   Tobacco abuse     PAST SURGICAL HISTORY: Past Surgical History:  Procedure Laterality Date   ACROMIO-CLAVICULAR JOINT REPAIR Left 09/11/2016   Procedure: ACROMIO-CLAVICULAR RECONSTRUCTION;  Surgeon: Corky Mull, MD;  Location: [facility];  Service: Orthopedics;  Laterality: Left;  clavicle   FLEXIBLE SIGMOIDOSCOPY N/A 02/24/2022   Procedure: FLEXIBLE SIGMOIDOSCOPY;  Surgeon: Lesly Rubenstein, MD;  Location: [facility];  Service: Endoscopy;  Laterality: N/A;   IR IMAGING GUIDED PORT INSERTION  03/10/2022   LEFT HEART CATH AND CORONARY ANGIOGRAPHY N/A 05/20/2021   Procedure: LEFT HEART CATH AND CORONARY ANGIOGRAPHY with coronary intervention;  Surgeon: [name], MD;  Location: [place] [facility];  Service: Cardiovascular;  Laterality: N/A;   MANDIBLE RECONSTRUCTION     TONSILLECTOMY     AGE 66   XI ROBOTIC ASSISTED INGUINAL HERNIA REPAIR WITH MESH Right 03/26/2020   Procedure: XI ROBOTIC ASSISTED INGUINAL HERNIA REPAIR WITH MESH;  Surgeon: Herbert Pun, MD;  Location: [facility];  Service: General;  Laterality: Right;    FAMILY HISTORY: Family History  Problem Relation Age of Onset   Diabetes Mother    Prostate cancer Father     ADVANCED DIRECTIVES (Y/N):  N  HEALTH MAINTENANCE: Social History   Tobacco Use   Smoking status: Some Days    Packs/day: 0.25    Years: 15.00    Total pack years: 3.75    Types: Cigarettes    Passive exposure: Current   Smokeless tobacco: Never  Vaping Use   Vaping  Use: Never used  Substance Use Topics   Alcohol use: Yes    Alcohol/week: 42.0 standard drinks of alcohol    Types: 42 Cans of beer per week    Comment: 3 every other day   Drug use: No     Colonoscopy:  PAP:  Bone density:  Lipid panel:  No Known Allergies  Current Outpatient Medications  Medication Sig Dispense Refill   acetaminophen (TYLENOL) 325 MG tablet  Take 2 tablets (650 mg total) by mouth every 6 (six) hours as needed for mild pain.     ALPRAZolam (XANAX) 0.25 MG tablet Take 1 tablet (0.25 mg total) by mouth at bedtime as needed for sleep. 30 tablet 0   atorvastatin (LIPITOR) 40 MG tablet Take 1 tablet (40 mg total) by mouth daily. 30 tablet 0   feeding supplement (ENSURE ENLIVE / ENSURE PLUS) LIQD Take 237 mLs by mouth 3 (three) times daily between meals. 27035 mL 0   folic acid (FOLVITE) 1 MG tablet Take 1 tablet (1 mg total) by mouth daily. 30 tablet 0   lidocaine-prilocaine (EMLA) cream Apply to affected area once 30 g 3   lidocaine-prilocaine (EMLA) cream Apply 1 application  topically as needed. Apply cream to port 1 hour prior to chemotherapy. Cover with plastic wrap. 30 g 3   Multiple Vitamins-Minerals (MULTIVITAMIN WITH MINERALS) tablet Take 1 tablet by mouth daily.     nicotine (NICODERM CQ - DOSED IN MG/24 HOURS) 21 mg/24hr patch Place 21 mg onto the skin daily.     ondansetron (ZOFRAN) 8 MG tablet TAKE 1 TABLET 2 TIMES DAILY AS NEEDED FOR REFRACTORY NAUSEA / VOMITING. START DAY 3 AFTER CHEMO 60 tablet 2   pantoprazole (PROTONIX) 40 MG tablet Take 1 tablet (40 mg total) by mouth daily. 30 tablet 2   polyethylene glycol (MIRALAX / GLYCOLAX) 17 g packet Take 17 g by mouth daily.     sildenafil (VIAGRA) 25 MG tablet Take 1 tablet (25 mg total) by mouth daily as needed for erectile dysfunction. 10 tablet 0   sotalol (BETAPACE) 80 MG tablet Take 80 mg by mouth daily.     tamsulosin (FLOMAX) 0.4 MG CAPS capsule Take 2 capsules (0.8 mg total) by mouth daily. 30 capsule 11   thiamine 100 MG tablet Take 1 tablet (100 mg total) by mouth daily. 30 tablet 0   amoxicillin-clavulanate (AUGMENTIN) 250-62.5 MG/5ML suspension Take 2.5 mLs (125 mg total) by mouth 3 (three) times daily. (Patient not taking: Reported on 07/02/2022) 52.5 mL 0   doxycycline (VIBRA-TABS) 100 MG tablet Take 1 tablet (100 mg total) by mouth 3 (three) times daily. (Patient not  taking: Reported on 07/09/2022) 21 tablet 0   fentaNYL (DURAGESIC) 50 MCG/HR Place 1 patch onto the skin every 3 (three) days. 10 patch 0   ferrous sulfate 325 (65 FE) MG tablet Take 1 tablet (325 mg total) by mouth daily. 30 tablet 0   Oxycodone HCl 10 MG TABS Take 0.5-1 tablets (5-10 mg total) by mouth every 4 (four) hours as needed. 60 tablet 0   prochlorperazine (COMPAZINE) 10 MG tablet Take 1 tablet (10 mg total) by mouth every 6 (six) hours as needed (Nausea or vomiting). (Patient not taking: Reported on 06/18/2022) 60 tablet 2   No current facility-administered medications for this visit.    OBJECTIVE: Vitals:   07/09/22 0850  BP: 128/89  Pulse: 76  Resp: 16  Temp: 97.7 F (36.5 C)  SpO2: 99%     Body  mass index is 16.67 kg/m.    ECOG FS:1 - Symptomatic but completely ambulatory  General: Well-developed, well-nourished, no acute distress. Eyes: Pink conjunctiva, anicteric sclera. HEENT: Normocephalic, moist mucous membranes. Lungs: No audible wheezing or coughing. Heart: Regular rate and rhythm. Abdomen: Soft, nontender, no obvious distention. Musculoskeletal: No edema, cyanosis, or clubbing. Neuro: Alert, answering all questions appropriately. Cranial nerves grossly intact. Skin: No rashes or petechiae noted. Psych: Normal affect.  LAB RESULTS:  Lab Results  Component Value Date   NA 132 (L) 07/09/2022   K 3.6 07/09/2022   CL 100 07/09/2022   CO2 26 07/09/2022   GLUCOSE 112 (H) 07/09/2022   BUN 5 (L) 07/09/2022   CREATININE 0.50 (L) 07/09/2022   CALCIUM 8.8 (L) 07/09/2022   PROT 6.9 07/09/2022   ALBUMIN 3.1 (L) 07/09/2022   AST 31 07/09/2022   ALT 14 07/09/2022   ALKPHOS 76 07/09/2022   BILITOT 0.5 07/09/2022   GFRNONAA >60 07/09/2022   GFRAA >60 10/03/2016    Lab Results  Component Value Date   WBC 4.1 07/09/2022   NEUTROABS 0.5 (L) 07/09/2022   HGB 11.9 (L) 07/09/2022   HCT 34.9 (L) 07/09/2022   MCV 93.1 07/09/2022   PLT 146 (L) 07/09/2022      STUDIES: No results found.  ASSESSMENT: Stage IIa adenocarcinoma of the rectum.  PLAN:    Stage IIa adenocarcinoma of the rectum: CT scan results of chest, abdomen, and pelvis reviewed independently and it does not appear patient has metastatic disease.  MRI of the pelvis completed on March 03, 2022 confirmed stage of disease.  CEA only mildly elevated at 6.7.  Patient has undergone diverting colostomy.  He would likely benefit from neoadjuvant chemotherapy using 8 cycles of FOLFOX followed by concurrent chemotherapy using Xeloda and XRT.  Patient will then require definitive surgery to remove any residual disease.  It is unclear if his colostomy is reversible at this time.  He has had port placed.  Despite neutropenia, we will proceed with cycle 8 of treatment today.  Return to clinic in 2 days for pump removal and Udenyca and then in 2 weeks for further evaluation.  Patient has been given referrals to radiation oncology as well as clinical pharmacy in preparation for concurrent chemotherapy with Xeloda and XRT.    Pain: Chronic and unchanged.  Mostly related to his ostomy.  His back pain seems musculoskeletal.  Continue fentanyl to 50 mcg every 72 hours and only uses oxycodone and Tylenol sparingly.  Appreciate palliative care input. Anemia: Hemoglobin has mildly trended down to 1.9, monitor. Thrombocytopenia: Platelet count improved to 146.   Hyponatremia: Chronic and unchanged.  Patient's sodium level is 132 today. Mouth pain/poor dentition: Previously improved with antibiotics, but now is no longer responding.  Patient reports he is actively trying to get a dental appointment.   Pain at ostomy site: Continue evaluation and monitoring per surgery.  Narcotic regimen as above. Hypokalemia: Resolved.   Hypomagnesia: Resolved. Neutropenia: Proceed cautiously with treatment as above.  Patient will receive Udenyca with his pump off in 2 days.    Patient expressed understanding and was in  agreement with this plan. He also understands that He can call clinic at any time with any questions, concerns, or complaints.    Cancer Staging  Adenocarcinoma of rectum [facility]) Staging form: Colon and Rectum, AJCC 8th Edition - Clinical stage from 03/10/2022: Stage IIA (cT3, cN0, cM0) - Signed by Lloyd Huger, MD on 03/10/2022 Stage prefix: Initial diagnosis  Total positive nodes: 0  Lloyd Huger, MD   07/10/2022 6:35 AM

## 2022-07-07 ENCOUNTER — Inpatient Hospital Stay (HOSPITAL_BASED_OUTPATIENT_CLINIC_OR_DEPARTMENT_OTHER): Payer: Medicare Other | Admitting: Hospice and Palliative Medicine

## 2022-07-07 DIAGNOSIS — Z515 Encounter for palliative care: Secondary | ICD-10-CM | POA: Diagnosis not present

## 2022-07-07 DIAGNOSIS — G893 Neoplasm related pain (acute) (chronic): Secondary | ICD-10-CM | POA: Diagnosis not present

## 2022-07-07 DIAGNOSIS — C20 Malignant neoplasm of rectum: Secondary | ICD-10-CM

## 2022-07-07 IMAGING — CT CT CHEST W/ CM
2 of 4 series · 15 of 36 positions shown, 18 images · IV contrast (agent unspecified)
Comparison: Abdominopelvic CT 02/22/2022.  Chest CTA 05/19/2021.

CLINICAL DATA: Metastatic colon cancer, staging. * Tracking Code:
BO *

EXAM:
CT CHEST WITH CONTRAST
TECHNIQUE: Multidetector CT imaging of the chest was performed during
intravenous contrast administration.

[Series 2: axial st · axial · 0.82mm/px · z∈[-612,-288]mm · 12 of 192 slices shown, 15 images]
[im 15/192  mediastinal]
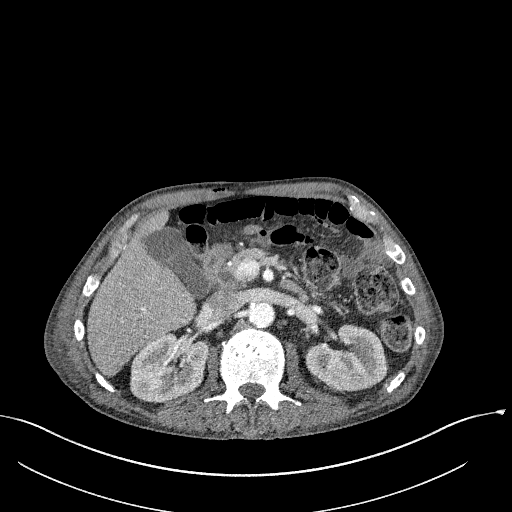
[im 15/192  lung]
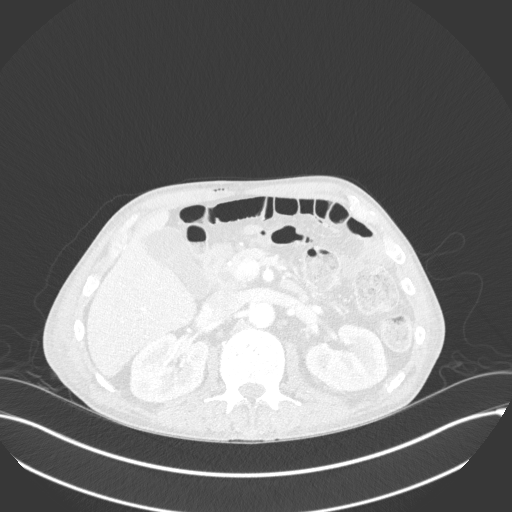
[im 30/192  lung]
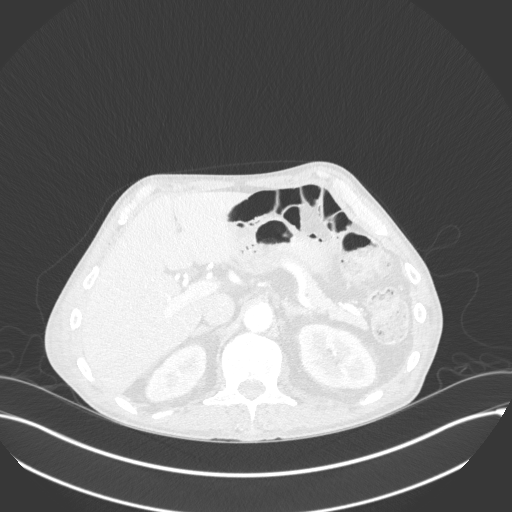
[im 45/192  lung]
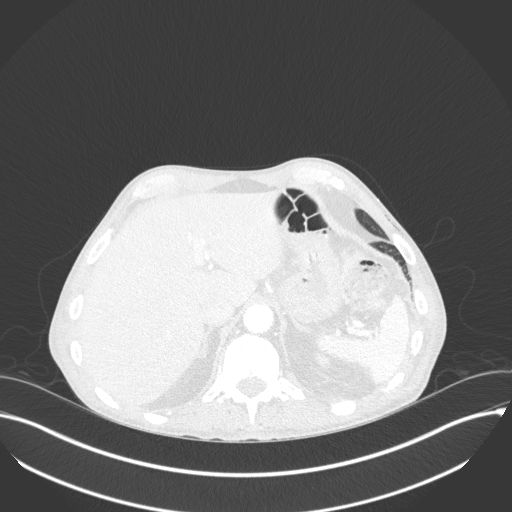
[im 59/192  lung]
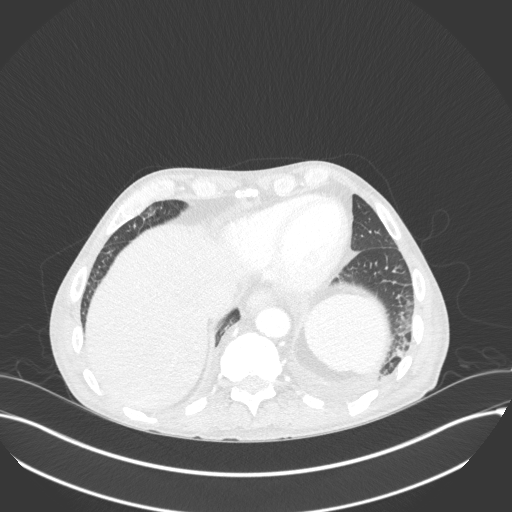
[im 74/192  mediastinal]
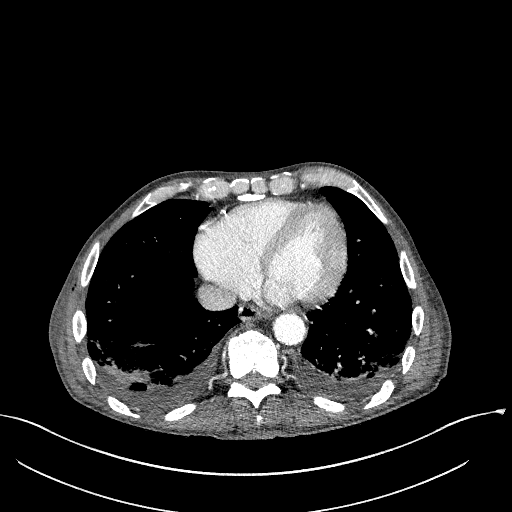
[im 74/192  lung]
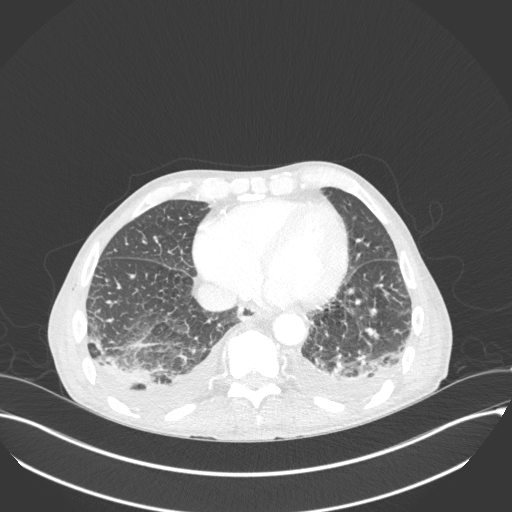
[im 89/192  lung]
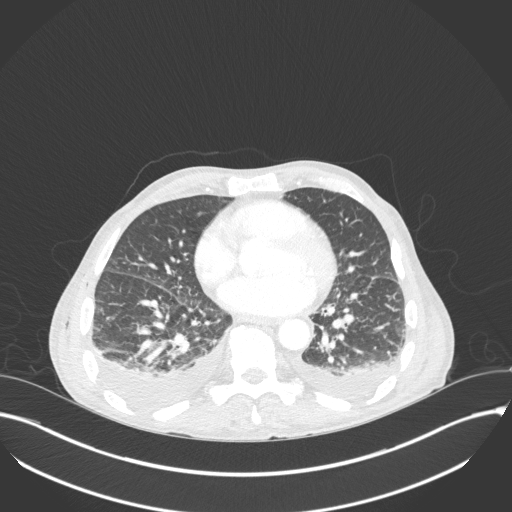
[im 103/192  lung]
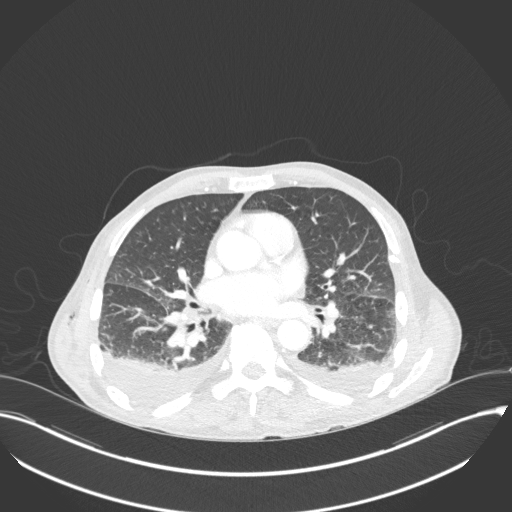
[im 118/192  lung]
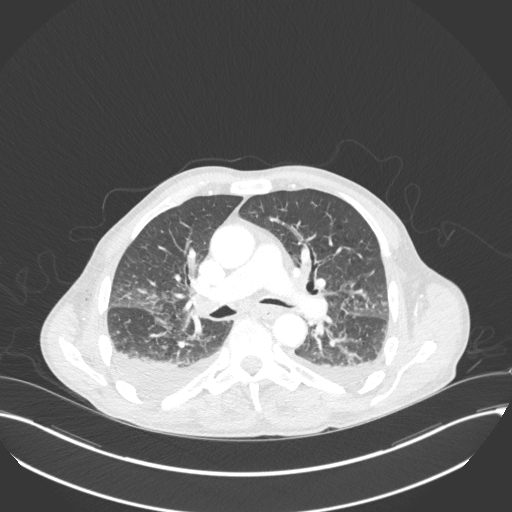
[im 133/192  mediastinal]
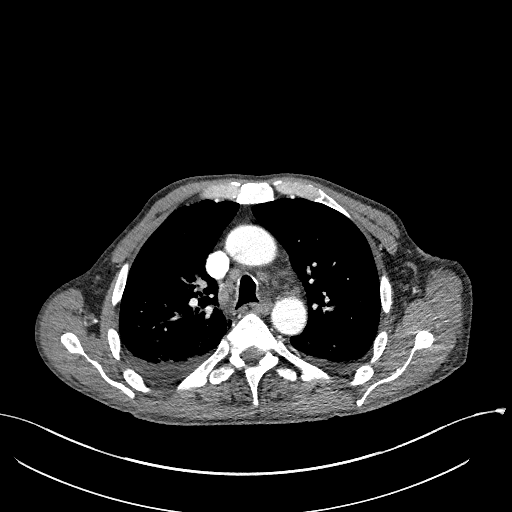
[im 133/192  lung]
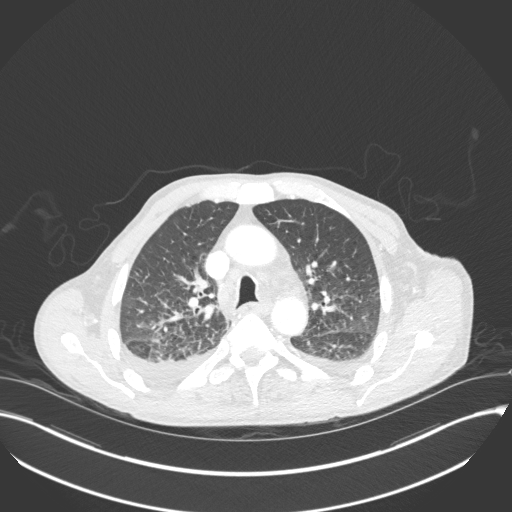
[im 147/192  lung]
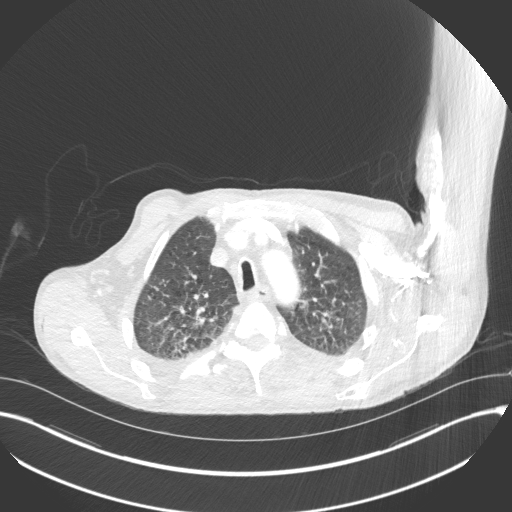
[im 162/192  lung]
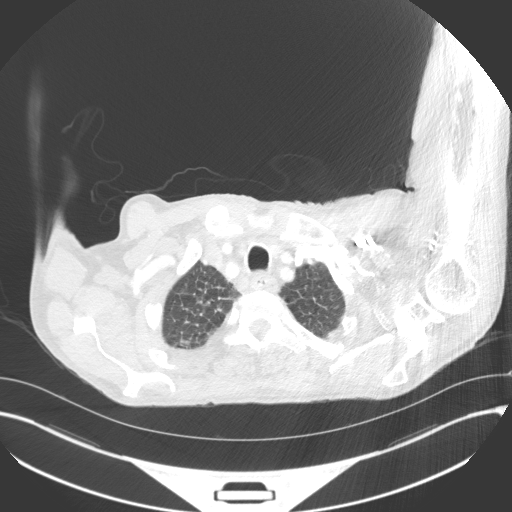
[im 177/192  lung]
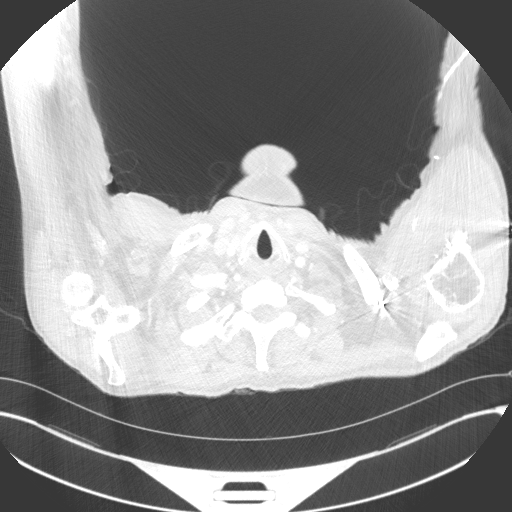

[Series 5: coronal · coronal · 0.71mm/px · 3 of 126 slices shown]
[im 26/126  lung]
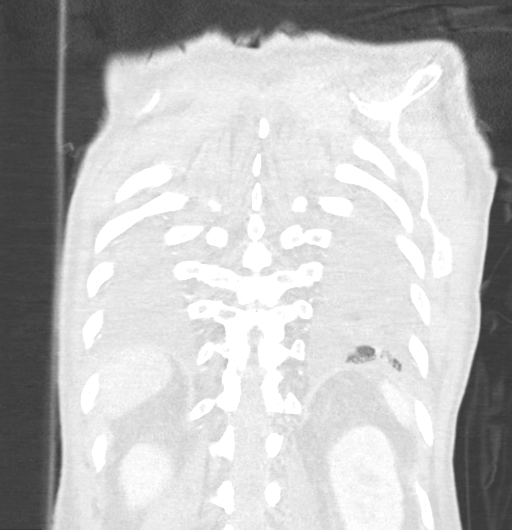
[im 51/126  lung]
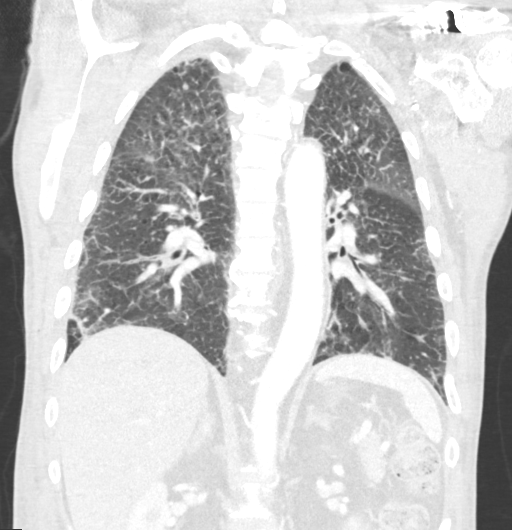
[im 76/126  lung]
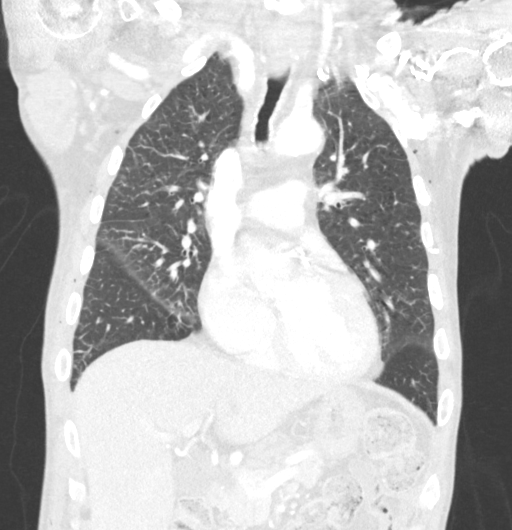

[15 of 36 positions shown; findings below may reference images not displayed]

RADIATION DOSE REDUCTION: This exam was performed according to the
departmental dose-optimization program which includes automated
exposure control, adjustment of the mA and/or kV according to
patient size and/or use of iterative reconstruction technique.

CONTRAST:  75mL OMNIPAQUE IOHEXOL 300 MG/ML  SOLN
FINDINGS: Cardiovascular: Multi-vessel coronary artery atherosclerosis with
lesser involvement of the aorta and great vessels. No acute vascular
findings are identified. The heart size is normal. There is no
pericardial effusion.

Mediastinum/Nodes: Small hilar lymph nodes bilaterally are similar
to the previous CT, likely reactive. No enlarged mediastinal or
axillary lymph nodes demonstrated. The thyroid gland, trachea and
esophagus demonstrate no significant findings.

Lungs/Pleura: There are new small bilateral pleural effusions with
associated bibasilar atelectasis compared with the abdominal CT
performed 3 days ago. Underlying mild centrilobular emphysema with
new diffuse central airway thickening and ill-defined peribronchial
opacities which may reflect edema or early inflammation. No
confluent airspace opacity or highly suspicious pulmonary
nodularity.

Upper abdomen: No acute or significant findings are seen within the
visualized upper abdomen.

Musculoskeletal/Chest wall: New soft tissue emphysema within the
right lateral chest wall, of uncertain etiology. No focal fluid
collections are identified. There are nonacute incompletely healed
rib fractures on the left, unchanged from previous CT. No acute or
suspicious osseous findings. Mild degenerative changes in the
thoracic spine.
IMPRESSION: 1. Compared with the abdominal CT of 3 days ago, there are new
bilateral pleural effusions with bibasilar atelectasis, central
airway thickening and patchy pulmonary opacities, likely reflecting
edema or atypical inflammation. Recommend chest radiographic
follow-up.
2. Assessment for metastatic disease limited by the acute findings
described above. However, given those limitations, no findings
highly suspicious for thoracic metastatic disease. Mildly prominent
hilar lymph nodes bilaterally are likely reactive, similar to
previous chest CT.
3. Soft tissue emphysema within the right lateral chest wall which
may be postsurgical. Correlate clinically. No focal fluid
collection, acute rib injury or pneumothorax.
4. Coronary and aortic atherosclerosis (KUGNE-7BM.M). Emphysema
([id number].H).

## 2022-07-07 MED ORDER — SILDENAFIL CITRATE 25 MG PO TABS: 25.0000 mg | ORAL_TABLET | Freq: Every day | ORAL | 0 refills | Status: DC | PRN

## 2022-07-07 MED ORDER — PANTOPRAZOLE SODIUM 40 MG PO TBEC
40.0000 mg | DELAYED_RELEASE_TABLET | Freq: Every day | ORAL | 2 refills | Status: DC
Start: 2022-07-07 — End: 2022-07-09

## 2022-07-07 MED ORDER — FENTANYL 50 MCG/HR TD PT72: 1.0000 | MEDICATED_PATCH | TRANSDERMAL | 0 refills | Status: DC

## 2022-07-07 NOTE — Progress Notes (Signed)
Virtual Visit via Telephone Note  I connected with Kyle Larson on 07/07/22 at 11:30 AM EDT by telephone and verified that I am speaking with the correct person using two identifiers.  Location: Patient: Home Provider: Clinic   I discussed the limitations, risks, security and privacy concerns of performing an evaluation and management service by telephone and the availability of in person appointments. I also discussed with the patient that there may be a patient responsible charge related to this service. The patient expressed understanding and agreed to proceed.   History of Present Illness: Kyle Larson is a 66 y.o. 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  Patient has been symptomatic with pain and poor appetite.  He is referred to palliative care to help address goals and manage ongoing symptoms.   Observations/Objective: Patient reports that he is doing reasonably well.  Pain is reportedly stable as long as he is on the transdermal fentanyl.  However, he says he ran out of fentanyl patches about a week ago.  I had previously sent a refill to [facility] but patient says that he wants to switch medications to [facility] and asked that I send a new refill there.  Previous Rx canceled.  Patient says that he is not taking oxycodone routinely as long as he is on the fentanyl patches.  He denies adverse effects or issues with pain medications.  He says the constipation is improved on daily bowel regimen.  Assessment and Plan: Neoplasm related pain - refill fentanyl #10. Continue oxycodone as needed for BTP.  Continue daily bowel regimen   Follow Up Instructions: Follow-up telephone visit 1 to 2 months   I discussed the assessment and treatment plan with the patient. The patient was provided an opportunity  to ask questions and all were answered. The patient agreed with the plan and demonstrated an understanding of the instructions.   The patient was advised to call back or seek an in-person evaluation if the symptoms worsen or if the condition fails to improve as anticipated.  I provided 10 minutes of non-face-to-face time during this encounter.   Irean Hong, NP

## 2022-07-08 MED FILL — Dexamethasone Sodium Phosphate Inj 100 MG/10ML: INTRAMUSCULAR | Qty: 1 | Status: AC

## 2022-07-09 ENCOUNTER — Inpatient Hospital Stay: Payer: Medicare Other

## 2022-07-09 ENCOUNTER — Inpatient Hospital Stay (HOSPITAL_BASED_OUTPATIENT_CLINIC_OR_DEPARTMENT_OTHER): Payer: Medicare Other | Admitting: Oncology

## 2022-07-09 ENCOUNTER — Other Ambulatory Visit: Payer: Self-pay

## 2022-07-09 ENCOUNTER — Encounter: Payer: Self-pay | Admitting: Oncology

## 2022-07-09 VITALS — BP 138/84 | HR 75

## 2022-07-09 VITALS — BP 128/89 | HR 76 | Temp 97.7°F | Resp 16 | Ht 72.0 in | Wt 122.9 lb

## 2022-07-09 DIAGNOSIS — Z5111 Encounter for antineoplastic chemotherapy: Secondary | ICD-10-CM | POA: Diagnosis not present

## 2022-07-09 DIAGNOSIS — C20 Malignant neoplasm of rectum: Secondary | ICD-10-CM

## 2022-07-09 LAB — COMPREHENSIVE METABOLIC PANEL
ALT: 14 U/L (ref 0–44)
AST: 31 U/L (ref 15–41)
Albumin: 3.1 g/dL — ABNORMAL LOW (ref 3.5–5.0)
Alkaline Phosphatase: 76 U/L (ref 38–126)
Anion gap: 6 (ref 5–15)
BUN: 5 mg/dL — ABNORMAL LOW (ref 8–23)
CO2: 26 mmol/L (ref 22–32)
Calcium: 8.8 mg/dL — ABNORMAL LOW (ref 8.9–10.3)
Chloride: 100 mmol/L (ref 98–111)
Creatinine, Ser: 0.5 mg/dL — ABNORMAL LOW (ref 0.61–1.24)
GFR, Estimated: >60 mL/min (ref >60)
Glucose, Bld: 112 mg/dL — ABNORMAL HIGH (ref 70–99)
Potassium: 3.6 mmol/L (ref 3.5–5.1)
Sodium: 132 mmol/L — ABNORMAL LOW (ref 135–145)
Total Bilirubin: 0.5 mg/dL (ref 0.3–1.2)
Total Protein: 6.9 g/dL (ref 6.5–8.1)

## 2022-07-09 LAB — CBC WITH DIFFERENTIAL/PLATELET
Abs Immature Granulocytes: 0.01 10*3/uL (ref 0.00–0.07)
Basophils Absolute: 0.1 10*3/uL (ref 0.0–0.1)
Basophils Relative: 2 %
Eosinophils Absolute: 0.1 10*3/uL (ref 0.0–0.5)
Eosinophils Relative: 2 %
HCT: 34.9 % — ABNORMAL LOW (ref 39.0–52.0)
Hemoglobin: 11.9 g/dL — ABNORMAL LOW (ref 13.0–17.0)
Immature Granulocytes: 0 %
Lymphocytes Relative: 61 %
Lymphs Abs: 2.5 10*3/uL (ref 0.7–4.0)
MCH: 31.7 pg (ref 26.0–34.0)
MCHC: 34.1 g/dL (ref 30.0–36.0)
MCV: 93.1 fL (ref 80.0–100.0)
Monocytes Absolute: 1 10*3/uL (ref 0.1–1.0)
Monocytes Relative: 23 %
Neutro Abs: 0.5 10*3/uL — ABNORMAL LOW (ref 1.7–7.7)
Neutrophils Relative %: 12 %
Platelets: 146 10*3/uL — ABNORMAL LOW (ref 150–400)
RBC: 3.75 MIL/uL — ABNORMAL LOW (ref 4.22–5.81)
RDW: 20.3 % — ABNORMAL HIGH (ref 11.5–15.5)
WBC: 4.1 10*3/uL (ref 4.0–10.5)
nRBC: 0 % (ref 0.0–0.2)

## 2022-07-09 MED ORDER — PANTOPRAZOLE SODIUM 40 MG PO TBEC: 40.0000 mg | DELAYED_RELEASE_TABLET | Freq: Every day | ORAL | 2 refills | Status: DC

## 2022-07-09 MED ORDER — DEXTROSE 5 % IV SOLN
Freq: Once | INTRAVENOUS | Status: AC
  Filled 2022-07-09: qty 250

## 2022-07-09 MED ORDER — PALONOSETRON HCL INJECTION 0.25 MG/5ML
0.2500 mg | Freq: Once | INTRAVENOUS | Status: AC
  Administered 2022-07-09: 0.25 mg via INTRAVENOUS

## 2022-07-09 MED ORDER — SODIUM CHLORIDE 0.9 % IV SOLN
10.0000 mg | Freq: Once | INTRAVENOUS | Status: AC
  Administered 2022-07-09: 10 mg via INTRAVENOUS
  Filled 2022-07-09: qty 10

## 2022-07-09 MED ORDER — LEUCOVORIN CALCIUM INJECTION 350 MG
700.0000 mg | Freq: Once | INTRAVENOUS | Status: AC
  Administered 2022-07-09: 700 mg via INTRAVENOUS
  Filled 2022-07-09: qty 35

## 2022-07-09 MED ORDER — SODIUM CHLORIDE 0.9 % IV SOLN
2400.0000 mg/m2 | INTRAVENOUS | Status: DC
  Administered 2022-07-09: 4150 mg via INTRAVENOUS
  Filled 2022-07-09: qty 83

## 2022-07-09 MED ORDER — OXYCODONE HCL 10 MG PO TABS: 5.0000 mg | ORAL_TABLET | ORAL | 0 refills | Status: DC | PRN

## 2022-07-09 MED ORDER — OXALIPLATIN CHEMO INJECTION 100 MG/20ML
85.0000 mg/m2 | Freq: Once | INTRAVENOUS | Status: AC
  Administered 2022-07-09: 145 mg via INTRAVENOUS
  Filled 2022-07-09: qty 20

## 2022-07-09 MED ORDER — FENTANYL 50 MCG/HR TD PT72: 1.0000 | MEDICATED_PATCH | TRANSDERMAL | 0 refills | Status: DC

## 2022-07-09 MED ORDER — SILDENAFIL CITRATE 25 MG PO TABS
25.0000 mg | ORAL_TABLET | Freq: Every day | ORAL | 0 refills | Status: DC | PRN
Start: 2022-07-09 — End: 2023-02-03

## 2022-07-09 MED ORDER — PALONOSETRON HCL INJECTION 0.25 MG/5ML
INTRAVENOUS | Status: AC
  Filled 2022-07-09: qty 5

## 2022-07-09 MED ORDER — FLUOROURACIL CHEMO INJECTION 2.5 GM/50ML
400.0000 mg/m2 | Freq: Once | INTRAVENOUS | Status: AC
  Administered 2022-07-09: 700 mg via INTRAVENOUS
  Filled 2022-07-09: qty 14

## 2022-07-09 NOTE — Telephone Encounter (Signed)
Wants these sent to [facility]

## 2022-07-09 NOTE — Progress Notes (Deleted)
Patient Neut# 0.5 MD notified, will continue with treatment only if Ellen Henri is approved per MD

## 2022-07-09 NOTE — Progress Notes (Signed)
0945 Patient Neut# 0.5 MD notified, will continue with treatment only if Ellen Henri is approved per MD.  [phone] RN informed Treatment today, udenyca approved, scheduled day 2 w/ pump d/c

## 2022-07-09 NOTE — Patient Instructions (Signed)
[facility] [place] [facility]  Discharge Instructions: Thank you for choosing [facility] to provide your oncology and hematology care.  If you have a lab appointment with the Cancer Center, please go directly to the Cancer Center and check in at the registration area.  Wear comfortable clothing and clothing appropriate for easy access to any Portacath or PICC line.   We strive to give you quality time with your provider. You may need to reschedule your appointment if you arrive late (15 or more minutes).  Arriving late affects you and other patients whose appointments are after yours.  Also, if you miss three or more appointments without notifying the office, you may be dismissed from the clinic at the provider's discretion.      For prescription refill requests, have your pharmacy contact our office and allow 72 hours for refills to be completed.    Today you received the following chemotherapy and/or immunotherapy agents FOLFOX      To help prevent nausea and vomiting after your treatment, we encourage you to take your nausea medication as directed.  BELOW ARE SYMPTOMS THAT SHOULD BE REPORTED IMMEDIATELY: *FEVER GREATER THAN 100.4 F (38 C) OR HIGHER *CHILLS OR SWEATING *NAUSEA AND VOMITING THAT IS NOT CONTROLLED WITH YOUR NAUSEA MEDICATION *UNUSUAL SHORTNESS OF BREATH *UNUSUAL BRUISING OR BLEEDING *URINARY PROBLEMS (pain or burning when urinating, or frequent urination) *BOWEL PROBLEMS (unusual diarrhea, constipation, pain near the anus) TENDERNESS IN MOUTH AND THROAT WITH OR WITHOUT PRESENCE OF ULCERS (sore throat, sores in mouth, or a toothache) UNUSUAL RASH, SWELLING OR PAIN  UNUSUAL VAGINAL DISCHARGE OR ITCHING   Items with * indicate a potential emergency and should be followed up as soon as possible or go to the Emergency Department if any problems should occur.  Please show the CHEMOTHERAPY ALERT CARD or IMMUNOTHERAPY ALERT CARD at check-in to the  Emergency Department and triage nurse.  Should you have questions after your visit or need to cancel or reschedule your appointment, please contact [facility] [place]  [phone] and follow the prompts.  Office hours are 8:00 a.m. to 4:30 p.m. Monday - Friday. Please note that voicemails left after 4:00 p.m. may not be returned until the following business day.  We are closed weekends and major holidays. You have access to a nurse at all times for urgent questions. Please call the main number to the clinic [phone] and follow the prompts.  For any non-urgent questions, you may also contact your provider using MyChart. We now offer e-Visits for anyone 18 and older to request care online for non-urgent symptoms. For details visit [email or web address].   Also download the MyChart app! Go to the app store, search "MyChart", open the app, select [facility], and log in with your MyChart username and password.  Masks are optional in the cancer centers. If you would like for your care team to wear a mask while they are taking care of you, please let them know. For doctor visits, patients may have with them one support person who is at least 65 years old. At this time, visitors are not allowed in the infusion area.   

## 2022-07-10 ENCOUNTER — Other Ambulatory Visit: Payer: Self-pay | Admitting: *Deleted

## 2022-07-10 ENCOUNTER — Encounter: Payer: Self-pay | Admitting: Oncology

## 2022-07-10 MED ORDER — OXYCODONE HCL 5 MG PO TABS: ORAL_TABLET | ORAL | 0 refills | Status: DC

## 2022-07-10 NOTE — Telephone Encounter (Addendum)
Pharmacy out of stock of Oxycodone 10 mg asking we send prescription for 5 mg tabs which thye have in stock

## 2022-07-11 ENCOUNTER — Inpatient Hospital Stay: Payer: Medicare Other

## 2022-07-11 VITALS — BP 146/85 | HR 82 | Temp 97.6°F | Resp 18

## 2022-07-11 DIAGNOSIS — Z5111 Encounter for antineoplastic chemotherapy: Secondary | ICD-10-CM | POA: Diagnosis not present

## 2022-07-11 DIAGNOSIS — C20 Malignant neoplasm of rectum: Secondary | ICD-10-CM

## 2022-07-11 MED ORDER — PEGFILGRASTIM-CBQV 6 MG/0.6ML ~~LOC~~ SOSY
6.0000 mg | PREFILLED_SYRINGE | Freq: Once | SUBCUTANEOUS | Status: AC
  Administered 2022-07-11: 6 mg via SUBCUTANEOUS

## 2022-07-11 MED ORDER — HEPARIN SOD (PORK) LOCK FLUSH 100 UNIT/ML IV SOLN
500.0000 [IU] | Freq: Once | INTRAVENOUS | Status: AC | PRN
  Administered 2022-07-11: 500 [IU]
  Filled 2022-07-11: qty 5

## 2022-07-11 MED ORDER — SODIUM CHLORIDE 0.9% FLUSH
10.0000 mL | INTRAVENOUS | Status: DC | PRN
  Administered 2022-07-11: 10 mL
  Filled 2022-07-11: qty 10

## 2022-07-11 NOTE — Patient Instructions (Signed)
[facility] CANCER CTR AT [place]  Discharge Instructions: Thank you for choosing [facility] to provide your oncology and hematology care.  If you have a lab appointment with the [facility], please go directly to the [facility] and check in at the registration area.  Wear comfortable clothing and clothing appropriate for easy access to any Portacath or PICC line.   We strive to give you quality time with your provider. You may need to reschedule your appointment if you arrive late (15 or more minutes).  Arriving late affects you and other patients whose appointments are after yours.  Also, if you miss three or more appointments without notifying the office, you may be dismissed from the clinic at the provider's discretion.      For prescription refill requests, have your pharmacy contact our office and allow 72 hours for refills to be completed.    Today you received the following chemotherapy and/or immunotherapy agents PUMP DC and UDENCYA      To help prevent nausea and vomiting after your treatment, we encourage you to take your nausea medication as directed.  BELOW ARE SYMPTOMS THAT SHOULD BE REPORTED IMMEDIATELY: *FEVER GREATER THAN 100.4 F (38 C) OR HIGHER *CHILLS OR SWEATING *NAUSEA AND VOMITING THAT IS NOT CONTROLLED WITH YOUR NAUSEA MEDICATION *UNUSUAL SHORTNESS OF BREATH *UNUSUAL BRUISING OR BLEEDING *URINARY PROBLEMS (pain or burning when urinating, or frequent urination) *BOWEL PROBLEMS (unusual diarrhea, constipation, pain near the anus) TENDERNESS IN MOUTH AND THROAT WITH OR WITHOUT PRESENCE OF ULCERS (sore throat, sores in mouth, or a toothache) UNUSUAL RASH, SWELLING OR PAIN  UNUSUAL VAGINAL DISCHARGE OR ITCHING   Items with * indicate a potential emergency and should be followed up as soon as possible or go to the Emergency Department if any problems should occur.  Please show the CHEMOTHERAPY ALERT CARD or IMMUNOTHERAPY ALERT CARD at  check-in to the Emergency Department and triage nurse.  Should you have questions after your visit or need to cancel or reschedule your appointment, please contact [facility] CANCER [place] AT [place]  [phone] and follow the prompts.  Office hours are 8:00 a.m. to 4:30 p.m. Monday - Friday. Please note that voicemails left after 4:00 p.m. may not be returned until the following business day.  We are closed weekends and major holidays. You have access to a nurse at all times for urgent questions. Please call the main number to the clinic [phone] and follow the prompts.  For any non-urgent questions, you may also contact your provider using MyChart. We now offer e-Visits for anyone 74 and older to request care online for non-urgent symptoms. For details visit [email or web address].   Also download the MyChart app! Go to the app store, search "MyChart", open the app, select [facility], and log in with your MyChart username and password.  Masks are optional in the cancer centers. If you would like for your care team to wear a mask while they are taking care of you, please let them know. For doctor visits, patients may have with them one support person who is at least 65 years old. At this time, visitors are not allowed in the infusion area.  Pegfilgrastim Injection What is this medication? PEGFILGRASTIM (PEG fil gra stim) lowers the risk of infection in people who are receiving chemotherapy. It works by Building control surveyor make more white blood cells, which protects your body from infection. It may also be used to help people who have been exposed to high  doses of radiation. This medicine may be used for other purposes; ask your health care provider or pharmacist if you have questions. COMMON BRAND NAME(S): [place] [employer], Neulasta, Nyvepria, Stimufend, UDENYCA, Ziextenzo What should I tell my care team before I take this medication? They need to know if you have any of these  conditions: Kidney disease Latex allergy Ongoing radiation therapy Sickle cell disease Skin reactions to acrylic adhesives (On-Body Injector only) An unusual or allergic reaction to pegfilgrastim, filgrastim, other medications, foods, dyes, or preservatives Pregnant or trying to get pregnant Breast-feeding How should I use this medication? This medication is for injection under the skin. If you get this medication at home, you will be taught how to prepare and give the pre-filled syringe or how to use the On-body Injector. Refer to the patient Instructions for Use for detailed instructions. Use exactly as directed. Tell your care team immediately if you suspect that the On-body Injector may not have performed as intended or if you suspect the use of the On-body Injector resulted in a missed or partial dose. It is important that you put your used needles and syringes in a special sharps container. Do not put them in a trash can. If you do not have a sharps container, call your pharmacist or care team to get one. Talk to your care team about the use of this medication in children. While this medication may be prescribed for selected conditions, precautions do apply. Overdosage: If you think you have taken too much of this medicine contact a poison control center or emergency room at once. NOTE: This medicine is only for you. Do not share this medicine with others. What if I miss a dose? It is important not to miss your dose. Call your care team if you miss your dose. If you miss a dose due to an On-body Injector failure or leakage, a new dose should be administered as soon as possible using a single prefilled syringe for manual use. What may interact with this medication? Interactions have not been studied. This list may not describe all possible interactions. Give your health care provider a list of all the medicines, herbs, non-prescription drugs, or dietary supplements you use. Also tell them if  you smoke, drink alcohol, or use illegal drugs. Some items may interact with your medicine. What should I watch for while using this medication? Your condition will be monitored carefully while you are receiving this medication. You may need blood work done while you are taking this medication. Talk to your care team about your risk of cancer. You may be more at risk for certain types of cancer if you take this medication. If you are going to need a MRI, CT scan, or other procedure, tell your care team that you are using this medication (On-Body Injector only). What side effects may I notice from receiving this medication? Side effects that you should report to your care team as soon as possible: Allergic reactions--skin rash, itching, hives, swelling of the face, lips, tongue, or throat Capillary leak syndrome--stomach or muscle pain, unusual weakness or fatigue, feeling faint or lightheaded, decrease in the amount of urine, swelling of the ankles, hands, or feet, trouble breathing High white blood cell level--fever, fatigue, trouble breathing, night sweats, change in vision, weight loss Inflammation of the aorta--fever, fatigue, back, chest, or stomach pain, severe headache Kidney injury (glomerulonephritis)--decrease in the amount of urine, red or dark brown urine, foamy or bubbly urine, swelling of the ankles, hands, or feet  Shortness of breath or trouble breathing Spleen injury--pain in upper left stomach or shoulder Unusual bruising or bleeding Side effects that usually do not require medical attention (report to your care team if they continue or are bothersome): Bone pain Pain in the hands or feet This list may not describe all possible side effects. Call your doctor for medical advice about side effects. You may report side effects to FDA at 1-800-FDA-1088. Where should I keep my medication? Keep out of the reach of children. If you are using this medication at home, you will be  instructed on how to store it. Throw away any unused medication after the expiration date on the label. NOTE: This sheet is a summary. It may not cover all possible information. If you have questions about this medicine, talk to your doctor, pharmacist, or health care provider.  2023 Elsevier/Gold Standard (2014-02-10 00:00:00)

## 2022-07-16 ENCOUNTER — Ambulatory Visit
Admission: RE | Admit: 2022-07-16 | Discharge: 2022-07-16 | Disposition: A | Discharge status: Home / Self Care | Payer: Medicare Other | Source: Ambulatory Visit | Attending: Radiation Oncology | Admitting: Radiation Oncology

## 2022-07-16 ENCOUNTER — Encounter: Payer: Self-pay | Admitting: Radiation Oncology

## 2022-07-16 VITALS — BP 113/74 | HR 91 | Temp 97.5°F | Resp 20 | Wt 116.6 lb

## 2022-07-16 DIAGNOSIS — F17210 Nicotine dependence, cigarettes, uncomplicated: Secondary | ICD-10-CM | POA: Diagnosis not present

## 2022-07-16 DIAGNOSIS — Z8673 Personal history of transient ischemic attack (TIA), and cerebral infarction without residual deficits: Secondary | ICD-10-CM | POA: Insufficient documentation

## 2022-07-16 DIAGNOSIS — Z8042 Family history of malignant neoplasm of prostate: Secondary | ICD-10-CM | POA: Diagnosis not present

## 2022-07-16 DIAGNOSIS — Z79899 Other long term (current) drug therapy: Secondary | ICD-10-CM | POA: Insufficient documentation

## 2022-07-16 DIAGNOSIS — C20 Malignant neoplasm of rectum: Secondary | ICD-10-CM | POA: Insufficient documentation

## 2022-07-16 DIAGNOSIS — Z9221 Personal history of antineoplastic chemotherapy: Secondary | ICD-10-CM | POA: Diagnosis not present

## 2022-07-16 DIAGNOSIS — Z933 Colostomy status: Secondary | ICD-10-CM | POA: Diagnosis not present

## 2022-07-16 DIAGNOSIS — I2510 Atherosclerotic heart disease of native coronary artery without angina pectoris: Secondary | ICD-10-CM | POA: Insufficient documentation

## 2022-07-16 NOTE — Consult Note (Signed)
NEW PATIENT EVALUATION  Name: Kyle Larson  MRN: [id number]  Date:   07/16/2022     DOB: September 23, 1956   This 66 y.o. 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  REFERRING PHYSICIAN: [name], MD  CHIEF COMPLAINT:  Chief Complaint  Patient presents with   Rectal Cancer    DIAGNOSIS: The encounter diagnosis was Adenocarcinoma of rectum ([place]).   PREVIOUS INVESTIGATIONS:  MRI scans CT scans reviewed Pathology report reviewed Clinical notes reviewed  HPI: Patient is a 66 year old male who presented with problems with his urination.  On work-up he was found of a large necrotic rectal mass measuring at l at least 8.8 x 7.2 x 8.9 cm with partial obstruction.  There was a urinary bladder wall thickening with hydronephrosis secondary direct tumor invasion from the large rectal mass.  MRI scan showed a T3 lesion with large exophytic fungating mass extending to the muscular propria.  Distance from the internal anal sphincter was 2.5 cm.  Biopsy was positive and moderately differentiated invasive adenocarcinoma.  Patient underwent diverting colostomy.  Patient has undergone 8 cycles of FOLFOX chemotherapy.  He is tolerated treatments fair he complains of pain around his colostomy site.  His urinary flow has improved.  He is now referred to radiation collagen for consideration of concurrent radiation therapy with oral Xeloda.  PLANNED TREATMENT REGIMEN: Concurrent chemoradiation to his pelvis in a neoadjuvant fashion for locally advanced colorectal cancer  PAST MEDICAL HISTORY:  has a past medical history of AC joint dislocation, CAD (coronary artery disease), CVA (cerebral vascular accident) ([place]), Metatarsal bone fracture, Syncope, and Tobacco abuse.    PAST SURGICAL HISTORY:  Past Surgical History:  Procedure Laterality Date   ACROMIO-CLAVICULAR JOINT REPAIR Left 09/11/2016    Procedure: ACROMIO-CLAVICULAR RECONSTRUCTION;  Surgeon: Corky Mull, MD;  Location: [facility] ORS;  Service: Orthopedics;  Laterality: Left;  clavicle   FLEXIBLE SIGMOIDOSCOPY N/A 02/24/2022   Procedure: FLEXIBLE SIGMOIDOSCOPY;  Surgeon: Lesly Rubenstein, MD;  Location: [facility] ENDOSCOPY;  Service: Endoscopy;  Laterality: N/A;   IR IMAGING GUIDED PORT INSERTION  03/10/2022   LEFT HEART CATH AND CORONARY ANGIOGRAPHY N/A 05/20/2021   Procedure: LEFT HEART CATH AND CORONARY ANGIOGRAPHY with coronary intervention;  Surgeon: [name], MD;  Location: [facility] LAB;  Service: Cardiovascular;  Laterality: N/A;   MANDIBLE RECONSTRUCTION     TONSILLECTOMY     AGE 54   XI ROBOTIC ASSISTED INGUINAL HERNIA REPAIR WITH MESH Right 03/26/2020   Procedure: XI ROBOTIC ASSISTED INGUINAL HERNIA REPAIR WITH MESH;  Surgeon: Herbert Pun, MD;  Location: [facility] ORS;  Service: General;  Laterality: Right;    FAMILY HISTORY: family history includes Diabetes in his mother; Prostate cancer in his father.  SOCIAL HISTORY:  reports that he has been smoking cigarettes. He has a 3.75 pack-year smoking history. He has been exposed to tobacco smoke. He has never used smokeless tobacco. He reports current alcohol use of about 42.0 standard drinks of alcohol per week. He reports that he does not use drugs.  ALLERGIES: Patient has no known allergies.  MEDICATIONS:  Current Outpatient Medications  Medication Sig Dispense Refill   acetaminophen (TYLENOL) 325 MG tablet Take 2 tablets (650 mg total) by mouth every 6 (six) hours as needed for mild pain.     ALPRAZolam (XANAX) 0.25 MG tablet Take 1 tablet (0.25 mg total) by mouth at bedtime as  needed for sleep. 30 tablet 0   atorvastatin (LIPITOR) 40 MG tablet Take 1 tablet (40 mg total) by mouth daily. 30 tablet 0   feeding supplement (ENSURE ENLIVE / ENSURE PLUS) LIQD Take 237 mLs by mouth 3 (three) times daily between meals. 21330 mL 0   fentaNYL (DURAGESIC) 50  MCG/HR Place 1 patch onto the skin every 3 (three) days. 10 patch 0   ferrous sulfate 325 (65 FE) MG tablet Take 1 tablet (325 mg total) by mouth daily. 30 tablet 0   folic acid (FOLVITE) 1 MG tablet Take 1 tablet (1 mg total) by mouth daily. 30 tablet 0   lidocaine-prilocaine (EMLA) cream Apply to affected area once 30 g 3   lidocaine-prilocaine (EMLA) cream Apply 1 application  topically as needed. Apply cream to port 1 hour prior to chemotherapy. Cover with plastic wrap. 30 g 3   Multiple Vitamins-Minerals (MULTIVITAMIN WITH MINERALS) tablet Take 1 tablet by mouth daily.     ondansetron (ZOFRAN) 8 MG tablet TAKE 1 TABLET 2 TIMES DAILY AS NEEDED FOR REFRACTORY NAUSEA / VOMITING. START DAY 3 AFTER CHEMO 60 tablet 2   oxyCODONE (OXY IR/ROXICODONE) 5 MG immediate release tablet Take 1 - 2 tablets every 4 hours as needed. 120 tablet 0   pantoprazole (PROTONIX) 40 MG tablet Take 1 tablet (40 mg total) by mouth daily. 30 tablet 2   polyethylene glycol (MIRALAX / GLYCOLAX) 17 g packet Take 17 g by mouth daily.     sildenafil (VIAGRA) 25 MG tablet Take 1 tablet (25 mg total) by mouth daily as needed for erectile dysfunction. 10 tablet 0   sotalol (BETAPACE) 80 MG tablet Take 80 mg by mouth daily.     tamsulosin (FLOMAX) 0.4 MG CAPS capsule Take 2 capsules (0.8 mg total) by mouth daily. 30 capsule 11   thiamine 100 MG tablet Take 1 tablet (100 mg total) by mouth daily. 30 tablet 0   nicotine (NICODERM CQ - DOSED IN MG/24 HOURS) 21 mg/24hr patch Place 21 mg onto the skin daily. (Patient not taking: Reported on 07/16/2022)     prochlorperazine (COMPAZINE) 10 MG tablet Take 1 tablet (10 mg total) by mouth every 6 (six) hours as needed (Nausea or vomiting). (Patient not taking: Reported on 06/18/2022) 60 tablet 2   No current facility-administered medications for this encounter.    ECOG PERFORMANCE STATUS:  1 - Symptomatic but completely ambulatory  REVIEW OF SYSTEMS: Patient complains of back pain malaise  and fatigue. Patient denies any weight loss, fatigue, weakness, fever, chills or night sweats. Patient denies any loss of vision, blurred vision. Patient denies any ringing  of the ears or hearing loss. No irregular heartbeat. Patient denies heart murmur or history of fainting. Patient denies any chest pain or pain radiating to her upper extremities. Patient denies any shortness of breath, difficulty breathing at night, cough or hemoptysis. Patient denies any swelling in the lower legs. Patient denies any nausea vomiting, vomiting of blood, or coffee ground material in the vomitus. Patient denies any stomach pain. Patient states has had normal bowel movements no significant constipation or diarrhea. Patient denies any dysuria, hematuria or significant nocturia. Patient denies any problems walking, swelling in the joints or loss of balance. Patient denies any skin changes, loss of hair or loss of weight. Patient denies any excessive worrying or anxiety or significant depression. Patient denies any problems with insomnia. Patient denies excessive thirst, polyuria, polydipsia. Patient denies any swollen glands, patient denies easy bruising or  easy bleeding. Patient denies any recent infections, allergies or URI. Patient "s visual fields have not changed significantly in recent time.   PHYSICAL EXAM: BP 113/74 (BP Location: Left Arm, Patient Position: Sitting, Cuff Size: Small)   Pulse 91   Temp (!) 97.5 F (36.4 C) (Tympanic)   Resp 20   Wt 116 lb 9.6 oz (52.9 kg)   BMI 15.81 kg/m  Thin slightly cachectic male in NAD colostomy is functioning well.  Well-developed well-nourished patient in NAD. HEENT reveals PERLA, EOMI, discs not visualized.  Oral cavity is clear. No oral mucosal lesions are identified. Neck is clear without evidence of cervical or supraclavicular adenopathy. Lungs are clear to A&P. Cardiac examination is essentially unremarkable with regular rate and rhythm without murmur rub or thrill.  Abdomen is benign with no organomegaly or masses noted. Motor sensory and DTR levels are equal and symmetric in the upper and lower extremities. Cranial nerves II through XII are grossly intact. Proprioception is intact. No peripheral adenopathy or edema is identified. No motor or sensory levels are noted. Crude visual fields are within normal range.  LABORATORY DATA: Pathology reports reviewed    RADIOLOGY RESULTS: CT scans and MRI reviewed compatible with above-stated findings   IMPRESSION: Stage IIa large exophytic rectal cancer in 66 year old male status post diverting colostomy and neoadjuvant FOLFOX chemotherapy.  PLAN: At this time elect to go ahead with whole pelvic radiation therapy.  Would plan on delivering 45 Gray in 25 fractions.  We will use MRI fusion for treatment planning purposes.  Patient also be treated with oral Xeloda.  There will be extra effort by both professional staff as well as technical staff to coordinate and manage concurrent chemoradiation and ensuing side effects during his treatments. Risks and benefits of treatment including creased lower urinary tract symptoms diarrhea fatigue alteration of blood count skin reaction all were reviewed with the patient in detail.  I have personally set up and ordered CT simulation for next week.  We will coordinate and make sure through his oncology team that the oral Xeloda has been ordered.  They seem to comprehend my treatment plan well.  I would like to take this opportunity to thank you for allowing me to participate in the care of your patient.Noreene Filbert, MD

## 2022-07-22 NOTE — Progress Notes (Signed)
[place]  [phone] Fax:(336) [phone]  ID: Kyle Larson OB: 20-Apr-1956  MR#: [id number]  GBT#:[id number]  Patient Care Team: [name], MD as PCP - General (Cardiology) Clent Jacks, RN as Oncology Nurse Navigator Grayland Ormond, Kathlene November, MD as Consulting Physician (Oncology)  CHIEF COMPLAINT: Stage IIa adenocarcinoma of the rectum.  INTERVAL HISTORY: Patient returns to clinic today for further evaluation and further discussion of initiating XRT along with concurrent capecitabine.  He continues to have mouth pain.  His insomnia has improved.  He continues to have chronic weakness and fatigue.  He has no neurologic complaints.  He denies any recent fevers or illnesses.  He has no chest pain, shortness of breath, cough, or hemoptysis.  He has no nausea, vomiting, constipation, or diarrhea.  He does not report any melena or hematochezia.  He has no urinary complaints.  Patient offers no further specific complaints today.    REVIEW OF SYSTEMS:   Review of Systems  Constitutional:  Positive for malaise/fatigue. Negative for fever and weight loss.  Respiratory: Negative.  Negative for cough, hemoptysis and shortness of breath.   Cardiovascular: Negative.  Negative for chest pain and leg swelling.  Gastrointestinal: Negative.  Negative for abdominal pain, blood in stool, constipation, diarrhea and melena.  Genitourinary: Negative.  Negative for dysuria.  Musculoskeletal:  Positive for back pain.  Skin: Negative.  Negative for rash.  Neurological:  Positive for weakness. Negative for dizziness, speech change, focal weakness and headaches.  Psychiatric/Behavioral: Negative.  The patient is not nervous/anxious and does not have insomnia.     As per HPI. Otherwise, a complete review of systems is negative.  PAST MEDICAL HISTORY: Past Medical History:  Diagnosis Date   AC joint dislocation    CAD (coronary artery disease)    CVA (cerebral vascular  accident) ([place])    Metatarsal bone fracture    3rd metatarsal , left foot; April 2021   Syncope    June '22   Tobacco abuse     PAST SURGICAL HISTORY: Past Surgical History:  Procedure Laterality Date   ACROMIO-CLAVICULAR JOINT REPAIR Left 09/11/2016   Procedure: ACROMIO-CLAVICULAR RECONSTRUCTION;  Surgeon: Corky Mull, MD;  Location: [facility] ORS;  Service: Orthopedics;  Laterality: Left;  clavicle   FLEXIBLE SIGMOIDOSCOPY N/A 02/24/2022   Procedure: FLEXIBLE SIGMOIDOSCOPY;  Surgeon: Lesly Rubenstein, MD;  Location: [facility] ENDOSCOPY;  Service: Endoscopy;  Laterality: N/A;   IR IMAGING GUIDED PORT INSERTION  03/10/2022   LEFT HEART CATH AND CORONARY ANGIOGRAPHY N/A 05/20/2021   Procedure: LEFT HEART CATH AND CORONARY ANGIOGRAPHY with coronary intervention;  Surgeon: [name], MD;  Location: [facility];  Service: Cardiovascular;  Laterality: N/A;   MANDIBLE RECONSTRUCTION     TONSILLECTOMY     AGE 66   XI ROBOTIC ASSISTED INGUINAL HERNIA REPAIR WITH MESH Right 03/26/2020   Procedure: XI ROBOTIC ASSISTED INGUINAL HERNIA REPAIR WITH MESH;  Surgeon: Herbert Pun, MD;  Location: [facility] ORS;  Service: General;  Laterality: Right;    FAMILY HISTORY: Family History  Problem Relation Age of Onset   Diabetes Mother    Prostate cancer Father     ADVANCED DIRECTIVES (Y/N):  N  HEALTH MAINTENANCE: Social History   Tobacco Use   Smoking status: Some Days    Packs/day: 0.25    Years: 15.00    Total pack years: 3.75    Types: Cigarettes    Passive exposure: Current   Smokeless tobacco: Never  Vaping Use  Vaping Use: Never used  Substance Use Topics   Alcohol use: Yes    Alcohol/week: 42.0 standard drinks of alcohol    Types: 42 Cans of beer per week    Comment: 3 every other day   Drug use: No     Colonoscopy:  PAP:  Bone density:  Lipid panel:  No Known Allergies  Current Outpatient Medications  Medication Sig Dispense Refill   acetaminophen (TYLENOL)  325 MG tablet Take 2 tablets (650 mg total) by mouth every 6 (six) hours as needed for mild pain.     ALPRAZolam (XANAX) 0.25 MG tablet Take 1 tablet (0.25 mg total) by mouth at bedtime as needed for sleep. 30 tablet 0   atorvastatin (LIPITOR) 40 MG tablet Take 1 tablet (40 mg total) by mouth daily. 30 tablet 0   capecitabine (XELODA) 150 MG tablet Take 2 tablets (300 mg total) by mouth 2 (two) times daily after a meal. Take Monday-Friday. Take only on days of radiation. Take along with '500mg'$  tablets. 100 tablet 0   capecitabine (XELODA) 500 MG tablet Take 2 tablets (1,000 mg total) by mouth 2 (two) times daily after a meal. Take Monday-Friday. Take only on days of radiation. Take along with '150mg'$  tablets. 100 tablet 0   feeding supplement (ENSURE ENLIVE / ENSURE PLUS) LIQD Take 237 mLs by mouth 3 (three) times daily between meals. 21330 mL 0   fentaNYL (DURAGESIC) 50 MCG/HR Place 1 patch onto the skin every 3 (three) days. 10 patch 0   folic acid (FOLVITE) 1 MG tablet Take 1 tablet (1 mg total) by mouth daily. 30 tablet 0   lidocaine-prilocaine (EMLA) cream Apply to affected area once 30 g 3   lidocaine-prilocaine (EMLA) cream Apply 1 application  topically as needed. Apply cream to port 1 hour prior to chemotherapy. Cover with plastic wrap. 30 g 3   Multiple Vitamins-Minerals (MULTIVITAMIN WITH MINERALS) tablet Take 1 tablet by mouth daily.     ondansetron (ZOFRAN) 8 MG tablet TAKE 1 TABLET 2 TIMES DAILY AS NEEDED FOR REFRACTORY NAUSEA / VOMITING. START DAY 3 AFTER CHEMO 60 tablet 2   oxyCODONE (OXY IR/ROXICODONE) 5 MG immediate release tablet Take 1 - 2 tablets every 4 hours as needed. 120 tablet 0   pantoprazole (PROTONIX) 40 MG tablet Take 1 tablet (40 mg total) by mouth daily. 30 tablet 2   polyethylene glycol (MIRALAX / GLYCOLAX) 17 g packet Take 17 g by mouth daily.     sildenafil (VIAGRA) 25 MG tablet Take 1 tablet (25 mg total) by mouth daily as needed for erectile dysfunction. 10 tablet 0    sotalol (BETAPACE) 80 MG tablet Take 80 mg by mouth daily.     tamsulosin (FLOMAX) 0.4 MG CAPS capsule Take 2 capsules (0.8 mg total) by mouth daily. 30 capsule 11   thiamine 100 MG tablet Take 1 tablet (100 mg total) by mouth daily. 30 tablet 0   ferrous sulfate 325 (65 FE) MG tablet Take 1 tablet (325 mg total) by mouth daily. 30 tablet 0   nicotine (NICODERM CQ - DOSED IN MG/24 HOURS) 21 mg/24hr patch Place 21 mg onto the skin daily. (Patient not taking: Reported on 07/16/2022)     prochlorperazine (COMPAZINE) 10 MG tablet Take 1 tablet (10 mg total) by mouth every 6 (six) hours as needed (Nausea or vomiting). (Patient not taking: Reported on 06/18/2022) 60 tablet 2   No current facility-administered medications for this visit.    OBJECTIVE: Vitals:  07/23/22 1056  BP: 116/80  Pulse: 79  Temp: 98 F (36.7 C)     Body mass index is 15.87 kg/m.    ECOG FS:1 - Symptomatic but completely ambulatory  General: Well-developed, well-nourished, no acute distress. Eyes: Pink conjunctiva, anicteric sclera. HEENT: Normocephalic, moist mucous membranes. Lungs: No audible wheezing or coughing. Heart: Regular rate and rhythm. Abdomen: Soft, nontender, no obvious distention. Musculoskeletal: No edema, cyanosis, or clubbing. Neuro: Alert, answering all questions appropriately. Cranial nerves grossly intact. Skin: No rashes or petechiae noted. Psych: Normal affect.   LAB RESULTS:  Lab Results  Component Value Date   NA 131 (L) 07/23/2022   K 3.2 (L) 07/23/2022   CL 99 07/23/2022   CO2 25 07/23/2022   GLUCOSE 123 (H) 07/23/2022   BUN 6 (L) 07/23/2022   CREATININE 0.57 (L) 07/23/2022   CALCIUM 8.8 (L) 07/23/2022   PROT 6.6 07/23/2022   ALBUMIN 3.2 (L) 07/23/2022   AST 38 07/23/2022   ALT 16 07/23/2022   ALKPHOS 106 07/23/2022   BILITOT 0.4 07/23/2022   GFRNONAA >60 07/23/2022   GFRAA >60 10/03/2016    Lab Results  Component Value Date   WBC 16.8 (H) 07/23/2022   NEUTROABS  10.7 (H) 07/23/2022   HGB 11.6 (L) 07/23/2022   HCT 33.4 (L) 07/23/2022   MCV 93.6 07/23/2022   PLT 108 (L) 07/23/2022     STUDIES: No results found.  ASSESSMENT: Stage IIa adenocarcinoma of the rectum.  PLAN:    Stage IIa adenocarcinoma of the rectum: CT scan results of chest, abdomen, and pelvis reviewed independently and it does not appear patient has metastatic disease.  MRI of the pelvis completed on March 03, 2022 confirmed stage of disease.  CEA only mildly elevated at 6.7.  Patient has undergone diverting colostomy.  Patient completed neoadjuvant chemotherapy with 8 cycles of FOLFOX on July 09, 2022.  He will initiate neoadjuvant XRT along with capecitabine on July 31, 2022.  Once he completes this, will refer to surgery. It is unclear if his colostomy is reversible at this time.  He has had port placed.  Return to clinic 1 week after initiation of XRT for further evaluation and assess his toleration of treatment.   Pain: Chronic and unchanged.  Mostly related to his ostomy.  His back pain seems musculoskeletal.  Continue fentanyl to 50 mcg every 72 hours and only uses oxycodone and Tylenol sparingly.  Appreciate palliative care input. Anemia: Hemoglobin improved to 11.6, monitor. Thrombocytopenia: Platelets have trended down to 108, monitor. Hyponatremia: Chronic and unchanged.  Patient sodium is 131. Mouth pain/poor dentition: Previously improved with antibiotics, but now is no longer responding.  Patient was was given the number of the local dentists to call.   Pain at ostomy site: Continue evaluation and monitoring per surgery.  Narcotic regimen as above. Hypokalemia: Mild, monitor. Hypomagnesia: Resolved. Neutropenia: Patient now has leukocytosis.  Proceed with capecitabine as above.    Patient expressed understanding and was in agreement with this plan. He also understands that He can call clinic at any time with any questions, concerns, or complaints.    Cancer  Staging  Adenocarcinoma of rectum [facility]) Staging form: Colon and Rectum, AJCC 8th Edition - Clinical stage from 03/10/2022: Stage IIA (cT3, cN0, cM0) - Signed by Lloyd Huger, MD on 03/10/2022 Stage prefix: Initial diagnosis Total positive nodes: 0  Lloyd Huger, MD   07/25/2022 1:30 PM

## 2022-07-23 ENCOUNTER — Inpatient Hospital Stay: Payer: Medicare Other

## 2022-07-23 ENCOUNTER — Encounter: Payer: Self-pay | Admitting: Oncology

## 2022-07-23 ENCOUNTER — Other Ambulatory Visit (HOSPITAL_COMMUNITY): Payer: Self-pay

## 2022-07-23 ENCOUNTER — Inpatient Hospital Stay (HOSPITAL_BASED_OUTPATIENT_CLINIC_OR_DEPARTMENT_OTHER): Payer: Medicare Other | Admitting: Oncology

## 2022-07-23 ENCOUNTER — Ambulatory Visit
Admission: RE | Admit: 2022-07-23 | Discharge: 2022-07-23 | Disposition: A | Discharge status: Home / Self Care | Payer: Medicare Other | Source: Ambulatory Visit | Attending: Radiation Oncology | Admitting: Radiation Oncology

## 2022-07-23 ENCOUNTER — Inpatient Hospital Stay: Payer: Medicare Other | Admitting: Pharmacist

## 2022-07-23 VITALS — BP 116/80 | HR 79 | Temp 98.0°F | Wt 117.0 lb

## 2022-07-23 DIAGNOSIS — C20 Malignant neoplasm of rectum: Secondary | ICD-10-CM | POA: Insufficient documentation

## 2022-07-23 LAB — CBC WITH DIFFERENTIAL/PLATELET
Abs Immature Granulocytes: 0.6 10*3/uL — ABNORMAL HIGH (ref 0.00–0.07)
Basophils Absolute: 0.1 10*3/uL (ref 0.0–0.1)
Basophils Relative: 1 %
Eosinophils Absolute: 0.2 10*3/uL (ref 0.0–0.5)
Eosinophils Relative: 1 %
HCT: 33.4 % — ABNORMAL LOW (ref 39.0–52.0)
Hemoglobin: 11.6 g/dL — ABNORMAL LOW (ref 13.0–17.0)
Immature Granulocytes: 4 %
Lymphocytes Relative: 21 %
Lymphs Abs: 3.6 10*3/uL (ref 0.7–4.0)
MCH: 32.5 pg (ref 26.0–34.0)
MCHC: 34.7 g/dL (ref 30.0–36.0)
MCV: 93.6 fL (ref 80.0–100.0)
Monocytes Absolute: 1.6 10*3/uL — ABNORMAL HIGH (ref 0.1–1.0)
Monocytes Relative: 10 %
Neutro Abs: 10.7 10*3/uL — ABNORMAL HIGH (ref 1.7–7.7)
Neutrophils Relative %: 63 %
Platelets: 108 10*3/uL — ABNORMAL LOW (ref 150–400)
RBC: 3.57 MIL/uL — ABNORMAL LOW (ref 4.22–5.81)
RDW: 20.5 % — ABNORMAL HIGH (ref 11.5–15.5)
WBC: 16.8 10*3/uL — ABNORMAL HIGH (ref 4.0–10.5)
nRBC: 0.1 % (ref 0.0–0.2)

## 2022-07-23 LAB — COMPREHENSIVE METABOLIC PANEL
ALT: 16 U/L (ref 0–44)
AST: 38 U/L (ref 15–41)
Albumin: 3.2 g/dL — ABNORMAL LOW (ref 3.5–5.0)
Alkaline Phosphatase: 106 U/L (ref 38–126)
Anion gap: 7 (ref 5–15)
BUN: 6 mg/dL — ABNORMAL LOW (ref 8–23)
CO2: 25 mmol/L (ref 22–32)
Calcium: 8.8 mg/dL — ABNORMAL LOW (ref 8.9–10.3)
Chloride: 99 mmol/L (ref 98–111)
Creatinine, Ser: 0.57 mg/dL — ABNORMAL LOW (ref 0.61–1.24)
GFR, Estimated: >60 mL/min (ref >60)
Glucose, Bld: 123 mg/dL — ABNORMAL HIGH (ref 70–99)
Potassium: 3.2 mmol/L — ABNORMAL LOW (ref 3.5–5.1)
Sodium: 131 mmol/L — ABNORMAL LOW (ref 135–145)
Total Bilirubin: 0.4 mg/dL (ref 0.3–1.2)
Total Protein: 6.6 g/dL (ref 6.5–8.1)

## 2022-07-23 MED ORDER — CAPECITABINE 150 MG PO TABS
300.0000 mg | ORAL_TABLET | Freq: Two times a day (BID) | ORAL | 0 refills | Status: DC
  Filled 2022-07-23: qty 100, 25d supply, fill #0
  Filled 2022-07-25: qty 100, 35d supply, fill #0

## 2022-07-23 MED ORDER — CAPECITABINE 500 MG PO TABS
1000.0000 mg | ORAL_TABLET | Freq: Two times a day (BID) | ORAL | 0 refills | Status: DC
  Filled 2022-07-23: qty 100, 25d supply, fill #0
  Filled 2022-07-25: qty 100, 35d supply, fill #0

## 2022-07-23 NOTE — Progress Notes (Signed)
[place]  [phone] [phone]  Patient Care Team: [name], MD as PCP - General (Cardiology) Clent Jacks, RN as Oncology Nurse Navigator Lloyd Huger, MD as Consulting Physician (Oncology)   Name of the patient: Kyle Larson  [id number]  28-Dec-1955   Date of visit: 07/23/22  HPI: Patient is a 66 y.o. male with stage IIa rectal cancer. He was finished neoadjuvant treatment with FOLFOX. He will begin chemoradation on 07/31/22.   Reason for Consult: Capecitabine oral chemotherapy education.   PAST MEDICAL HISTORY: Past Medical History:  Diagnosis Date   AC joint dislocation    CAD (coronary artery disease)    CVA (cerebral vascular accident) ([place])    Metatarsal bone fracture    3rd metatarsal , left foot; April 2021   Syncope    June '22   Tobacco abuse     HEMATOLOGY/ONCOLOGY HISTORY:  Oncology History  Adenocarcinoma of rectum ([place])  02/25/2022 Initial Diagnosis   Adenocarcinoma of rectum ([place])   03/10/2022 Cancer Staging   Staging form: Colon and Rectum, AJCC 8th Edition - Clinical stage from 03/10/2022: Stage IIA (cT3, cN0, cM0) - Signed by Lloyd Huger, MD on 03/10/2022 Stage prefix: Initial diagnosis Total positive nodes: 0   03/12/2022 - 07/11/2022 Chemotherapy   Patient is on Treatment Plan : COLORECTAL FOLFOX q14d x 4 months       ALLERGIES:  has No Known Allergies.  MEDICATIONS:  Current Outpatient Medications  Medication Sig Dispense Refill   capecitabine (XELODA) 150 MG tablet Take 2 tablets (300 mg total) by mouth 2 (two) times daily after a meal. Take Monday-Friday. Take only on days of radiation. Take along with '500mg'$  tablets. 100 tablet 0   capecitabine (XELODA) 500 MG tablet Take 2 tablets (1,000 mg total) by mouth 2 (two) times daily after a meal. Take Monday-Friday. Take only on days of radiation. Take along with '150mg'$  tablets. 100 tablet 0   acetaminophen  (TYLENOL) 325 MG tablet Take 2 tablets (650 mg total) by mouth every 6 (six) hours as needed for mild pain.     ALPRAZolam (XANAX) 0.25 MG tablet Take 1 tablet (0.25 mg total) by mouth at bedtime as needed for sleep. 30 tablet 0   atorvastatin (LIPITOR) 40 MG tablet Take 1 tablet (40 mg total) by mouth daily. 30 tablet 0   feeding supplement (ENSURE ENLIVE / ENSURE PLUS) LIQD Take 237 mLs by mouth 3 (three) times daily between meals. 21330 mL 0   fentaNYL (DURAGESIC) 50 MCG/HR Place 1 patch onto the skin every 3 (three) days. 10 patch 0   ferrous sulfate 325 (65 FE) MG tablet Take 1 tablet (325 mg total) by mouth daily. 30 tablet 0   folic acid (FOLVITE) 1 MG tablet Take 1 tablet (1 mg total) by mouth daily. 30 tablet 0   lidocaine-prilocaine (EMLA) cream Apply to affected area once 30 g 3   lidocaine-prilocaine (EMLA) cream Apply 1 application  topically as needed. Apply cream to port 1 hour prior to chemotherapy. Cover with plastic wrap. 30 g 3   Multiple Vitamins-Minerals (MULTIVITAMIN WITH MINERALS) tablet Take 1 tablet by mouth daily.     nicotine (NICODERM CQ - DOSED IN MG/24 HOURS) 21 mg/24hr patch Place 21 mg onto the skin daily. (Patient not taking: Reported on 07/16/2022)     ondansetron (ZOFRAN) 8 MG tablet TAKE 1 TABLET 2 TIMES DAILY AS NEEDED FOR REFRACTORY NAUSEA / VOMITING. START DAY 3  AFTER CHEMO 60 tablet 2   oxyCODONE (OXY IR/ROXICODONE) 5 MG immediate release tablet Take 1 - 2 tablets every 4 hours as needed. 120 tablet 0   pantoprazole (PROTONIX) 40 MG tablet Take 1 tablet (40 mg total) by mouth daily. 30 tablet 2   polyethylene glycol (MIRALAX / GLYCOLAX) 17 g packet Take 17 g by mouth daily.     prochlorperazine (COMPAZINE) 10 MG tablet Take 1 tablet (10 mg total) by mouth every 6 (six) hours as needed (Nausea or vomiting). (Patient not taking: Reported on 06/18/2022) 60 tablet 2   sildenafil (VIAGRA) 25 MG tablet Take 1 tablet (25 mg total) by mouth daily as needed for erectile  dysfunction. 10 tablet 0   sotalol (BETAPACE) 80 MG tablet Take 80 mg by mouth daily.     tamsulosin (FLOMAX) 0.4 MG CAPS capsule Take 2 capsules (0.8 mg total) by mouth daily. 30 capsule 11   thiamine 100 MG tablet Take 1 tablet (100 mg total) by mouth daily. 30 tablet 0   No current facility-administered medications for this visit.    VITAL SIGNS: There were no vitals taken for this visit. There were no vitals filed for this visit.  Estimated body mass index is 15.81 kg/m as calculated from the following:   Height as of 07/09/22: 6' (1.829 m).   Weight as of 07/16/22: 52.9 kg (116 lb 9.6 oz).  LABS: CBC:    Component Value Date/Time   WBC 16.8 (H) 07/23/2022 0937   HGB 11.6 (L) 07/23/2022 0937   HCT 33.4 (L) 07/23/2022 0937   PLT 108 (L) 07/23/2022 0937   MCV 93.6 07/23/2022 0937   NEUTROABS 10.7 (H) 07/23/2022 0937   LYMPHSABS 3.6 07/23/2022 0937   MONOABS 1.6 (H) 07/23/2022 0937   EOSABS 0.2 07/23/2022 0937   BASOSABS 0.1 07/23/2022 0937   Comprehensive Metabolic Panel:    Component Value Date/Time   NA 131 (L) 07/23/2022 0937   K 3.2 (L) 07/23/2022 0937   CL 99 07/23/2022 0937   CO2 25 07/23/2022 0937   BUN 6 (L) 07/23/2022 0937   CREATININE 0.57 (L) 07/23/2022 0937   GLUCOSE 123 (H) 07/23/2022 0937   CALCIUM 8.8 (L) 07/23/2022 0937   AST 38 07/23/2022 0937   ALT 16 07/23/2022 0937   ALKPHOS 106 07/23/2022 0937   BILITOT 0.4 07/23/2022 0937   PROT 6.6 07/23/2022 0937   ALBUMIN 3.2 (L) 07/23/2022 0937     Present during today's visit: patient and his wife  Start plan: Patient will start capecitabine on 07/31/22 along with his first day of radiation.   Patient Education I spoke with patient for overview of new oral chemotherapy medication: capecitabine   Administration: Counseled patient on administration, dosing, side effects, monitoring, drug-food interactions, safe handling, storage, and disposal. Patient will take two '500mg'$  tablets and two '150mg'$  tablets  (1,300 mg total) by mouth 2 (two) times daily after a meal. Take Monday-Friday. Take only on days of radiation..  Side Effects: Side effects include but not limited to: diarrhea, hand-foot syndrome, mouth sores, edema, decreased wbc, fatigue, N/V Diarrhea: recommeded that he pick up some loperamide to use as needed. He knows to let the office know if he is having 4 or more loose stools per day Hand-foot syndrome: Provided patient with small bottle of [place] Smooth Extra Care 20 to use on his hand and feet. He knows to report any skin changes he notices Mouth sores: Patient knows to call for magic mouth wash if  needed Of note, patient has ongoing dental issues and reports not seeing a dentist for 10+ years. He said he was having a hard time getting in with an office. Provided him the the name and number of a local dentist to see if he could be seen  Drug-drug Interactions (DDI): Sotalol: Sotalol may enhance the QTc-prolonging effect of Fluorouracil Products. Recommend checking an ECG at new office visit once patient is on combintation Patient should stop folic acid while on the capecitabine Pantoprazole: Proton Pump Inhibitors (PPI) may diminish the therapeutic effect of capecitabine, varying information on the clinical impact. Recommend evaluating the need for a PPI/acid suppression. If acid suppression is needed, attempt switching to a H2 antagonist (eg, famotidine) if possible.   Adherence: After discussion with patient no patient barriers to medication adherence identified. Patient provided with medication AM/PM tray Reviewed with patient importance of keeping a medication schedule and plan for any missed doses.  Mr. Harps voiced understanding and appreciation. All questions answered. Medication handout provided.  Provided patient with [facility] phone number. Patient knows to call the office with questions or concerns. [facility] will  continue to follow.  Patient expressed understanding and was in agreement with this plan. He also understands that He can call clinic at any time with any questions, concerns, or complaints.   Medication Access Issues: Patient reports that he will have additional coverage with medicaid active on 07/25/22. Lyndee Leo, patient advocate, will reprocess his prescription at that time for cost and follow up with patient.  Follow-up plan: RTC 1 week after the start of radiation  Thank you for allowing me to participate in the care of this patient.   Time Total: 30 mins  Visit consisted of counseling and education on dealing with issues of symptom management in the setting of serious and potentially life-threatening illness.Greater than 50%  of this time was spent counseling and coordinating care related to the above assessment and plan.  Signed by: Darl Pikes, PharmD, BCPS, Salley Slaughter, CPP Hematology/Oncology Clinical Pharmacist Practitioner [place]/DB/AP [facility] [phone]  07/23/2022 10:24 AM

## 2022-07-25 ENCOUNTER — Telehealth: Payer: Self-pay | Admitting: Pharmacy Technician

## 2022-07-25 ENCOUNTER — Other Ambulatory Visit (HOSPITAL_COMMUNITY): Payer: Self-pay

## 2022-07-25 DIAGNOSIS — C20 Malignant neoplasm of rectum: Secondary | ICD-10-CM | POA: Insufficient documentation

## 2022-07-25 DIAGNOSIS — K1379 Other lesions of oral mucosa: Secondary | ICD-10-CM | POA: Diagnosis not present

## 2022-07-25 DIAGNOSIS — Z933 Colostomy status: Secondary | ICD-10-CM | POA: Insufficient documentation

## 2022-07-25 DIAGNOSIS — Z720 Tobacco use: Secondary | ICD-10-CM | POA: Insufficient documentation

## 2022-07-25 DIAGNOSIS — M549 Dorsalgia, unspecified: Secondary | ICD-10-CM | POA: Insufficient documentation

## 2022-07-25 DIAGNOSIS — E871 Hypo-osmolality and hyponatremia: Secondary | ICD-10-CM | POA: Diagnosis not present

## 2022-07-25 DIAGNOSIS — Z8673 Personal history of transient ischemic attack (TIA), and cerebral infarction without residual deficits: Secondary | ICD-10-CM | POA: Insufficient documentation

## 2022-07-25 DIAGNOSIS — D709 Neutropenia, unspecified: Secondary | ICD-10-CM | POA: Insufficient documentation

## 2022-07-25 DIAGNOSIS — Z510 Encounter for antineoplastic radiation therapy: Secondary | ICD-10-CM | POA: Diagnosis not present

## 2022-07-25 DIAGNOSIS — D649 Anemia, unspecified: Secondary | ICD-10-CM | POA: Insufficient documentation

## 2022-07-25 DIAGNOSIS — Z8042 Family history of malignant neoplasm of prostate: Secondary | ICD-10-CM | POA: Diagnosis not present

## 2022-07-25 NOTE — Telephone Encounter (Signed)
Oral Oncology Patient Advocate Encounter  After completing a benefits investigation, prior authorization for Capecitabine is not required at this time through [facility].  Patient's copay is $34.36 for the '500mg'$  tablets.    Patient's copay is $29.06 for the 150 mg tablets.  Lady Deutscher, CPhT-Adv Oncology Pharmacy Patient [place] Direct Number: [phone]  Fax: [phone]

## 2022-07-29 ENCOUNTER — Other Ambulatory Visit (HOSPITAL_COMMUNITY): Payer: Self-pay

## 2022-07-29 ENCOUNTER — Telehealth: Payer: Self-pay

## 2022-07-29 NOTE — Telephone Encounter (Signed)
Nutrition  Called patient to follow-up on nutritional status.  No answer.  Left message with call back number.  Olvin Rohr B. Zenia Resides, [place], [place] Registered Dietitian [phone]

## 2022-07-30 ENCOUNTER — Ambulatory Visit
Admission: RE | Admit: 2022-07-30 | Discharge: 2022-07-30 | Disposition: A | Discharge status: Home / Self Care | Payer: Medicare Other | Source: Ambulatory Visit | Attending: Radiation Oncology | Admitting: Radiation Oncology

## 2022-07-30 DIAGNOSIS — M549 Dorsalgia, unspecified: Secondary | ICD-10-CM | POA: Diagnosis not present

## 2022-07-30 DIAGNOSIS — Z8673 Personal history of transient ischemic attack (TIA), and cerebral infarction without residual deficits: Secondary | ICD-10-CM | POA: Diagnosis not present

## 2022-07-30 DIAGNOSIS — Z933 Colostomy status: Secondary | ICD-10-CM | POA: Diagnosis not present

## 2022-07-30 DIAGNOSIS — Z510 Encounter for antineoplastic radiation therapy: Secondary | ICD-10-CM | POA: Diagnosis not present

## 2022-07-30 DIAGNOSIS — D649 Anemia, unspecified: Secondary | ICD-10-CM | POA: Diagnosis not present

## 2022-07-30 DIAGNOSIS — C20 Malignant neoplasm of rectum: Secondary | ICD-10-CM | POA: Diagnosis not present

## 2022-07-30 DIAGNOSIS — Z720 Tobacco use: Secondary | ICD-10-CM | POA: Diagnosis not present

## 2022-07-30 DIAGNOSIS — D709 Neutropenia, unspecified: Secondary | ICD-10-CM | POA: Diagnosis not present

## 2022-07-30 DIAGNOSIS — E871 Hypo-osmolality and hyponatremia: Secondary | ICD-10-CM | POA: Diagnosis not present

## 2022-07-30 DIAGNOSIS — K1379 Other lesions of oral mucosa: Secondary | ICD-10-CM | POA: Diagnosis not present

## 2022-07-31 ENCOUNTER — Ambulatory Visit
Admission: RE | Admit: 2022-07-31 | Discharge: 2022-07-31 | Disposition: A | Discharge status: Home / Self Care | Payer: Medicare Other | Source: Ambulatory Visit | Attending: Radiation Oncology | Admitting: Radiation Oncology

## 2022-07-31 ENCOUNTER — Other Ambulatory Visit: Payer: Self-pay

## 2022-07-31 DIAGNOSIS — C20 Malignant neoplasm of rectum: Secondary | ICD-10-CM | POA: Diagnosis not present

## 2022-07-31 DIAGNOSIS — K1379 Other lesions of oral mucosa: Secondary | ICD-10-CM | POA: Diagnosis not present

## 2022-07-31 DIAGNOSIS — D649 Anemia, unspecified: Secondary | ICD-10-CM | POA: Diagnosis not present

## 2022-07-31 DIAGNOSIS — Z510 Encounter for antineoplastic radiation therapy: Secondary | ICD-10-CM | POA: Diagnosis not present

## 2022-07-31 DIAGNOSIS — D709 Neutropenia, unspecified: Secondary | ICD-10-CM | POA: Diagnosis not present

## 2022-07-31 DIAGNOSIS — E871 Hypo-osmolality and hyponatremia: Secondary | ICD-10-CM | POA: Diagnosis not present

## 2022-07-31 DIAGNOSIS — Z933 Colostomy status: Secondary | ICD-10-CM | POA: Diagnosis not present

## 2022-07-31 DIAGNOSIS — M549 Dorsalgia, unspecified: Secondary | ICD-10-CM | POA: Diagnosis not present

## 2022-07-31 DIAGNOSIS — Z8673 Personal history of transient ischemic attack (TIA), and cerebral infarction without residual deficits: Secondary | ICD-10-CM | POA: Diagnosis not present

## 2022-07-31 DIAGNOSIS — Z720 Tobacco use: Secondary | ICD-10-CM | POA: Diagnosis not present

## 2022-07-31 LAB — RAD ONC ARIA SESSION SUMMARY
Course Elapsed Days: 0
Plan Fractions Treated to Date: 1
Plan Prescribed Dose Per Fraction: 1.8 Gy
Plan Total Fractions Prescribed: 25
Plan Total Prescribed Dose: 45 Gy
Reference Point Dosage Given to Date: 1.8 Gy
Reference Point Session Dosage Given: 1.8 Gy
Session Number: 1

## 2022-08-01 ENCOUNTER — Ambulatory Visit
Admission: RE | Admit: 2022-08-01 | Discharge: 2022-08-01 | Disposition: A | Discharge status: Home / Self Care | Payer: Medicare Other | Source: Ambulatory Visit | Attending: Radiation Oncology | Admitting: Radiation Oncology

## 2022-08-01 ENCOUNTER — Other Ambulatory Visit: Payer: Self-pay

## 2022-08-01 DIAGNOSIS — M549 Dorsalgia, unspecified: Secondary | ICD-10-CM | POA: Diagnosis not present

## 2022-08-01 DIAGNOSIS — D649 Anemia, unspecified: Secondary | ICD-10-CM | POA: Diagnosis not present

## 2022-08-01 DIAGNOSIS — Z933 Colostomy status: Secondary | ICD-10-CM | POA: Diagnosis not present

## 2022-08-01 DIAGNOSIS — Z510 Encounter for antineoplastic radiation therapy: Secondary | ICD-10-CM | POA: Diagnosis not present

## 2022-08-01 DIAGNOSIS — Z8673 Personal history of transient ischemic attack (TIA), and cerebral infarction without residual deficits: Secondary | ICD-10-CM | POA: Diagnosis not present

## 2022-08-01 DIAGNOSIS — Z720 Tobacco use: Secondary | ICD-10-CM | POA: Diagnosis not present

## 2022-08-01 DIAGNOSIS — D709 Neutropenia, unspecified: Secondary | ICD-10-CM | POA: Diagnosis not present

## 2022-08-01 DIAGNOSIS — E871 Hypo-osmolality and hyponatremia: Secondary | ICD-10-CM | POA: Diagnosis not present

## 2022-08-01 DIAGNOSIS — K1379 Other lesions of oral mucosa: Secondary | ICD-10-CM | POA: Diagnosis not present

## 2022-08-01 DIAGNOSIS — C20 Malignant neoplasm of rectum: Secondary | ICD-10-CM | POA: Diagnosis not present

## 2022-08-01 LAB — RAD ONC ARIA SESSION SUMMARY
Course Elapsed Days: 1
Plan Fractions Treated to Date: 2
Plan Prescribed Dose Per Fraction: 1.8 Gy
Plan Total Fractions Prescribed: 25
Plan Total Prescribed Dose: 45 Gy
Reference Point Dosage Given to Date: 3.6 Gy
Reference Point Session Dosage Given: 1.8 Gy
Session Number: 2

## 2022-08-04 ENCOUNTER — Ambulatory Visit
Admission: RE | Admit: 2022-08-04 | Discharge: 2022-08-04 | Disposition: A | Discharge status: Home / Self Care | Payer: Medicare Other | Source: Ambulatory Visit | Attending: Radiation Oncology | Admitting: Radiation Oncology

## 2022-08-04 ENCOUNTER — Other Ambulatory Visit: Payer: Self-pay

## 2022-08-04 DIAGNOSIS — D649 Anemia, unspecified: Secondary | ICD-10-CM | POA: Diagnosis not present

## 2022-08-04 DIAGNOSIS — M549 Dorsalgia, unspecified: Secondary | ICD-10-CM | POA: Diagnosis not present

## 2022-08-04 DIAGNOSIS — C20 Malignant neoplasm of rectum: Secondary | ICD-10-CM | POA: Diagnosis not present

## 2022-08-04 DIAGNOSIS — Z720 Tobacco use: Secondary | ICD-10-CM | POA: Diagnosis not present

## 2022-08-04 DIAGNOSIS — E871 Hypo-osmolality and hyponatremia: Secondary | ICD-10-CM | POA: Diagnosis not present

## 2022-08-04 DIAGNOSIS — Z510 Encounter for antineoplastic radiation therapy: Secondary | ICD-10-CM | POA: Diagnosis not present

## 2022-08-04 DIAGNOSIS — Z8673 Personal history of transient ischemic attack (TIA), and cerebral infarction without residual deficits: Secondary | ICD-10-CM | POA: Diagnosis not present

## 2022-08-04 DIAGNOSIS — Z933 Colostomy status: Secondary | ICD-10-CM | POA: Diagnosis not present

## 2022-08-04 DIAGNOSIS — D709 Neutropenia, unspecified: Secondary | ICD-10-CM | POA: Diagnosis not present

## 2022-08-04 DIAGNOSIS — K1379 Other lesions of oral mucosa: Secondary | ICD-10-CM | POA: Diagnosis not present

## 2022-08-04 LAB — RAD ONC ARIA SESSION SUMMARY
Course Elapsed Days: 4
Plan Fractions Treated to Date: 3
Plan Prescribed Dose Per Fraction: 1.8 Gy
Plan Total Fractions Prescribed: 25
Plan Total Prescribed Dose: 45 Gy
Reference Point Dosage Given to Date: 5.4 Gy
Reference Point Session Dosage Given: 1.8 Gy
Session Number: 3

## 2022-08-05 ENCOUNTER — Other Ambulatory Visit: Payer: Self-pay

## 2022-08-05 ENCOUNTER — Other Ambulatory Visit: Payer: Self-pay | Admitting: *Deleted

## 2022-08-05 ENCOUNTER — Ambulatory Visit
Admission: RE | Admit: 2022-08-05 | Discharge: 2022-08-05 | Disposition: A | Discharge status: Home / Self Care | Payer: Medicare Other | Source: Ambulatory Visit | Attending: Radiation Oncology | Admitting: Radiation Oncology

## 2022-08-05 DIAGNOSIS — E871 Hypo-osmolality and hyponatremia: Secondary | ICD-10-CM | POA: Diagnosis not present

## 2022-08-05 DIAGNOSIS — Z510 Encounter for antineoplastic radiation therapy: Secondary | ICD-10-CM | POA: Diagnosis not present

## 2022-08-05 DIAGNOSIS — Z8673 Personal history of transient ischemic attack (TIA), and cerebral infarction without residual deficits: Secondary | ICD-10-CM | POA: Diagnosis not present

## 2022-08-05 DIAGNOSIS — D709 Neutropenia, unspecified: Secondary | ICD-10-CM | POA: Diagnosis not present

## 2022-08-05 DIAGNOSIS — C20 Malignant neoplasm of rectum: Secondary | ICD-10-CM | POA: Diagnosis not present

## 2022-08-05 DIAGNOSIS — Z720 Tobacco use: Secondary | ICD-10-CM | POA: Diagnosis not present

## 2022-08-05 DIAGNOSIS — K1379 Other lesions of oral mucosa: Secondary | ICD-10-CM | POA: Diagnosis not present

## 2022-08-05 DIAGNOSIS — M549 Dorsalgia, unspecified: Secondary | ICD-10-CM | POA: Diagnosis not present

## 2022-08-05 DIAGNOSIS — Z933 Colostomy status: Secondary | ICD-10-CM | POA: Diagnosis not present

## 2022-08-05 DIAGNOSIS — D649 Anemia, unspecified: Secondary | ICD-10-CM | POA: Diagnosis not present

## 2022-08-05 LAB — RAD ONC ARIA SESSION SUMMARY
Course Elapsed Days: 5
Plan Fractions Treated to Date: 4
Plan Prescribed Dose Per Fraction: 1.8 Gy
Plan Total Fractions Prescribed: 25
Plan Total Prescribed Dose: 45 Gy
Reference Point Dosage Given to Date: 7.2 Gy
Reference Point Session Dosage Given: 1.8 Gy
Session Number: 4

## 2022-08-05 MED ORDER — OXYCODONE HCL 5 MG PO TABS: ORAL_TABLET | ORAL | 0 refills | Status: DC

## 2022-08-06 ENCOUNTER — Other Ambulatory Visit: Payer: Self-pay

## 2022-08-06 ENCOUNTER — Ambulatory Visit
Admission: RE | Admit: 2022-08-06 | Discharge: 2022-08-06 | Disposition: A | Discharge status: Home / Self Care | Payer: Medicare Other | Source: Ambulatory Visit | Attending: Radiation Oncology | Admitting: Radiation Oncology

## 2022-08-06 DIAGNOSIS — Z720 Tobacco use: Secondary | ICD-10-CM | POA: Diagnosis not present

## 2022-08-06 DIAGNOSIS — C20 Malignant neoplasm of rectum: Secondary | ICD-10-CM | POA: Diagnosis not present

## 2022-08-06 DIAGNOSIS — M549 Dorsalgia, unspecified: Secondary | ICD-10-CM | POA: Diagnosis not present

## 2022-08-06 DIAGNOSIS — D649 Anemia, unspecified: Secondary | ICD-10-CM | POA: Diagnosis not present

## 2022-08-06 DIAGNOSIS — Z933 Colostomy status: Secondary | ICD-10-CM | POA: Diagnosis not present

## 2022-08-06 DIAGNOSIS — D709 Neutropenia, unspecified: Secondary | ICD-10-CM | POA: Diagnosis not present

## 2022-08-06 DIAGNOSIS — Z8673 Personal history of transient ischemic attack (TIA), and cerebral infarction without residual deficits: Secondary | ICD-10-CM | POA: Diagnosis not present

## 2022-08-06 DIAGNOSIS — K1379 Other lesions of oral mucosa: Secondary | ICD-10-CM | POA: Diagnosis not present

## 2022-08-06 DIAGNOSIS — Z510 Encounter for antineoplastic radiation therapy: Secondary | ICD-10-CM | POA: Diagnosis not present

## 2022-08-06 DIAGNOSIS — E871 Hypo-osmolality and hyponatremia: Secondary | ICD-10-CM | POA: Diagnosis not present

## 2022-08-06 LAB — RAD ONC ARIA SESSION SUMMARY
Course Elapsed Days: 6
Plan Fractions Treated to Date: 5
Plan Prescribed Dose Per Fraction: 1.8 Gy
Plan Total Fractions Prescribed: 25
Plan Total Prescribed Dose: 45 Gy
Reference Point Dosage Given to Date: 9 Gy
Reference Point Session Dosage Given: 1.8 Gy
Session Number: 5

## 2022-08-06 NOTE — Progress Notes (Signed)
[place]  [phone] [phone]  ID: [name] OB: June 04, 1956  MR#: [id number]  FBP#:[id number]  Patient Care Team: [name], MD as PCP - General (Cardiology) Clent Jacks, RN as Oncology Nurse Navigator Grayland Ormond, Kathlene November, MD as Consulting Physician (Oncology)  CHIEF COMPLAINT: Stage IIa adenocarcinoma of the rectum.  INTERVAL HISTORY: Patient returns to clinic today for further evaluation, laboratory work, and assess his toleration of XRT and concurrent capecitabine.  He has increased fatigue, but otherwise is tolerating his treatments well.  He does not complain of mouth pain today. He has no neurologic complaints.  He denies any recent fevers or illnesses.  He has no chest pain, shortness of breath, cough, or hemoptysis.  He has no nausea, vomiting, constipation, or diarrhea.  He does not report any melena or hematochezia.  He has no urinary complaints.  Patient offers no further specific complaints today.  REVIEW OF SYSTEMS:   Review of Systems  Constitutional:  Positive for malaise/fatigue. Negative for fever and weight loss.  Respiratory: Negative.  Negative for cough, hemoptysis and shortness of breath.   Cardiovascular: Negative.  Negative for chest pain and leg swelling.  Gastrointestinal: Negative.  Negative for abdominal pain, blood in stool, constipation, diarrhea and melena.  Genitourinary: Negative.  Negative for dysuria.  Musculoskeletal:  Positive for back pain.  Skin: Negative.  Negative for rash.  Neurological:  Positive for weakness. Negative for dizziness, speech change, focal weakness and headaches.  Psychiatric/Behavioral: Negative.  The patient is not nervous/anxious and does not have insomnia.     As per HPI. Otherwise, a complete review of systems is negative.  PAST MEDICAL HISTORY: Past Medical History:  Diagnosis Date   AC joint dislocation    CAD (coronary artery disease)    CVA (cerebral  vascular accident) ([place])    Metatarsal bone fracture    3rd metatarsal , left foot; April 2021   Syncope    June '22   Tobacco abuse     PAST SURGICAL HISTORY: Past Surgical History:  Procedure Laterality Date   ACROMIO-CLAVICULAR JOINT REPAIR Left 09/11/2016   Procedure: ACROMIO-CLAVICULAR RECONSTRUCTION;  Surgeon: Corky Mull, MD;  Location: [facility] ORS;  Service: Orthopedics;  Laterality: Left;  clavicle   FLEXIBLE SIGMOIDOSCOPY N/A 02/24/2022   Procedure: FLEXIBLE SIGMOIDOSCOPY;  Surgeon: Lesly Rubenstein, MD;  Location: [facility] ENDOSCOPY;  Service: Endoscopy;  Laterality: N/A;   IR IMAGING GUIDED PORT INSERTION  03/10/2022   LEFT HEART CATH AND CORONARY ANGIOGRAPHY N/A 05/20/2021   Procedure: LEFT HEART CATH AND CORONARY ANGIOGRAPHY with coronary intervention;  Surgeon: [name], MD;  Location: [place] [facility];  Service: Cardiovascular;  Laterality: N/A;   MANDIBLE RECONSTRUCTION     TONSILLECTOMY     AGE 66   XI ROBOTIC ASSISTED INGUINAL HERNIA REPAIR WITH MESH Right 03/26/2020   Procedure: XI ROBOTIC ASSISTED INGUINAL HERNIA REPAIR WITH MESH;  Surgeon: Herbert Pun, MD;  Location: [facility] ORS;  Service: General;  Laterality: Right;    FAMILY HISTORY: Family History  Problem Relation Age of Onset   Diabetes Mother    Prostate cancer Father     ADVANCED DIRECTIVES (Y/N):  N  HEALTH MAINTENANCE: Social History   Tobacco Use   Smoking status: Some Days    Packs/day: 0.25    Years: 15.00    Total pack years: 3.75    Types: Cigarettes    Passive exposure: Current   Smokeless tobacco: Never  Vaping Use  Vaping Use: Never used  Substance Use Topics   Alcohol use: Yes    Alcohol/week: 42.0 standard drinks of alcohol    Types: 42 Cans of beer per week    Comment: 3 every other day   Drug use: No     Colonoscopy:  PAP:  Bone density:  Lipid panel:  No Known Allergies  Current Outpatient Medications  Medication Sig Dispense Refill   acetaminophen  (TYLENOL) 325 MG tablet Take 2 tablets (650 mg total) by mouth every 6 (six) hours as needed for mild pain.     ALPRAZolam (XANAX) 0.25 MG tablet Take 1 tablet (0.25 mg total) by mouth at bedtime as needed for sleep. 30 tablet 0   atorvastatin (LIPITOR) 40 MG tablet Take 1 tablet (40 mg total) by mouth daily. 30 tablet 0   capecitabine (XELODA) 150 MG tablet Take 2 tablets (300 mg total) by mouth 2 (two) times daily after a meal. Take Monday-Friday. Take only on days of radiation. Take along with '500mg'$  tablets. 100 tablet 0   capecitabine (XELODA) 500 MG tablet Take 2 tablets (1,000 mg total) by mouth 2 (two) times daily after a meal. Take Monday-Friday. Take only on days of radiation. Take along with '150mg'$  tablets. 100 tablet 0   feeding supplement (ENSURE ENLIVE / ENSURE PLUS) LIQD Take 237 mLs by mouth 3 (three) times daily between meals. 21330 mL 0   fentaNYL (DURAGESIC) 50 MCG/HR Place 1 patch onto the skin every 3 (three) days. 10 patch 0   folic acid (FOLVITE) 1 MG tablet Take 1 tablet (1 mg total) by mouth daily. 30 tablet 0   lidocaine-prilocaine (EMLA) cream Apply to affected area once 30 g 3   lidocaine-prilocaine (EMLA) cream Apply 1 application  topically as needed. Apply cream to port 1 hour prior to chemotherapy. Cover with plastic wrap. 30 g 3   Multiple Vitamins-Minerals (MULTIVITAMIN WITH MINERALS) tablet Take 1 tablet by mouth daily.     ondansetron (ZOFRAN) 8 MG tablet TAKE 1 TABLET 2 TIMES DAILY AS NEEDED FOR REFRACTORY NAUSEA / VOMITING. START DAY 3 AFTER CHEMO 60 tablet 2   oxyCODONE (OXY IR/ROXICODONE) 5 MG immediate release tablet Take 1 - 2 tablets every 4 hours as needed. 120 tablet 0   pantoprazole (PROTONIX) 40 MG tablet Take 1 tablet (40 mg total) by mouth daily. 30 tablet 2   polyethylene glycol (MIRALAX / GLYCOLAX) 17 g packet Take 17 g by mouth daily.     sildenafil (VIAGRA) 25 MG tablet Take 1 tablet (25 mg total) by mouth daily as needed for erectile dysfunction. 10  tablet 0   sotalol (BETAPACE) 80 MG tablet Take 80 mg by mouth daily.     tamsulosin (FLOMAX) 0.4 MG CAPS capsule Take 2 capsules (0.8 mg total) by mouth daily. 30 capsule 11   thiamine 100 MG tablet Take 1 tablet (100 mg total) by mouth daily. 30 tablet 0   ferrous sulfate 325 (65 FE) MG tablet Take 1 tablet (325 mg total) by mouth daily. 30 tablet 0   nicotine (NICODERM CQ - DOSED IN MG/24 HOURS) 21 mg/24hr patch Place 21 mg onto the skin daily. (Patient not taking: Reported on 07/16/2022)     prochlorperazine (COMPAZINE) 10 MG tablet Take 1 tablet (10 mg total) by mouth every 6 (six) hours as needed (Nausea or vomiting). (Patient not taking: Reported on 06/18/2022) 60 tablet 2   No current facility-administered medications for this visit.    OBJECTIVE: Vitals:  08/07/22 1503  BP: 124/88  Pulse: 77  Temp: 98.1 F (36.7 C)     Body mass index is 16.14 kg/m.    ECOG FS:1 - Symptomatic but completely ambulatory  General: Thin, no acute distress.  Sitting in wheelchair. Eyes: Pink conjunctiva, anicteric sclera. HEENT: Normocephalic, moist mucous membranes. Lungs: No audible wheezing or coughing. Heart: Regular rate and rhythm. Abdomen: Soft, nontender, no obvious distention. Musculoskeletal: No edema, cyanosis, or clubbing. Neuro: Alert, answering all questions appropriately. Cranial nerves grossly intact. Skin: No rashes or petechiae noted. Psych: Normal affect.  LAB RESULTS:  Lab Results  Component Value Date   NA 133 (L) 08/07/2022   K 3.5 08/07/2022   CL 102 08/07/2022   CO2 25 08/07/2022   GLUCOSE 104 (H) 08/07/2022   BUN 7 (L) 08/07/2022   CREATININE 0.32 (L) 08/07/2022   CALCIUM 8.3 (L) 08/07/2022   PROT 6.3 (L) 08/07/2022   ALBUMIN 2.9 (L) 08/07/2022   AST 27 08/07/2022   ALT 13 08/07/2022   ALKPHOS 74 08/07/2022   BILITOT 0.7 08/07/2022   GFRNONAA >60 08/07/2022   GFRAA >60 10/03/2016    Lab Results  Component Value Date   WBC 3.5 (L) 08/07/2022    NEUTROABS 1.8 08/07/2022   HGB 10.9 (L) 08/07/2022   HCT 31.5 (L) 08/07/2022   MCV 96.3 08/07/2022   PLT 181 08/07/2022     STUDIES: No results found.  ASSESSMENT: Stage IIa adenocarcinoma of the rectum.  PLAN:    Stage IIa adenocarcinoma of the rectum: CT scan results of chest, abdomen, and pelvis reviewed independently and it does not appear patient has metastatic disease.  MRI of the pelvis completed on March 03, 2022 confirmed stage of disease.  CEA only mildly elevated at 6.7.  Patient has undergone diverting colostomy.  Patient completed neoadjuvant chemotherapy with 8 cycles of FOLFOX on July 09, 2022.  He initiated neoadjuvant XRT along with capecitabine on July 31, 2022.  Once he completes this, will refer to surgery. It is unclear if his colostomy is reversible at this time.  He has had port placed.  Continue treatment as prescribed.  Continue daily XRT.  Return to clinic in 1 week for laboratory work and then 2 weeks for laboratory work and further evaluation. Pain: Chronic and unchanged.  Mostly related to his ostomy.  His back pain seems musculoskeletal.  Continue oxycodone 10 mg as needed.   Anemia: Hemoglobin is trended down to 10.9, monitor.   Thrombocytopenia: Resolved. Hyponatremia: Sodium improved to 133, monitor. Mouth pain/poor dentition: Previously improved with antibiotics, but now is no longer responding.  Patient was was given the number of the local dentists to call.   Pain at ostomy site: Continue evaluation and monitoring per surgery.  Narcotic regimen as above. Hypokalemia:.   Hypomagnesia: Resolved. Neutropenia: Mild, monitor.  Continue treatment as above.   Patient expressed understanding and was in agreement with this plan. He also understands that He can call clinic at any time with any questions, concerns, or complaints.    Cancer Staging  Adenocarcinoma of rectum [facility]) Staging form: Colon and Rectum, AJCC 8th Edition - Clinical stage from  03/10/2022: Stage IIA (cT3, cN0, cM0) - Signed by Lloyd Huger, MD on 03/10/2022 Stage prefix: Initial diagnosis Total positive nodes: 0  Lloyd Huger, MD   08/07/2022 4:58 PM

## 2022-08-07 ENCOUNTER — Inpatient Hospital Stay: Payer: Medicare Other | Admitting: Pharmacist

## 2022-08-07 ENCOUNTER — Encounter: Payer: Self-pay | Admitting: Oncology

## 2022-08-07 ENCOUNTER — Inpatient Hospital Stay (HOSPITAL_BASED_OUTPATIENT_CLINIC_OR_DEPARTMENT_OTHER): Payer: Medicare Other | Admitting: Oncology

## 2022-08-07 ENCOUNTER — Inpatient Hospital Stay: Payer: Medicare Other | Attending: Oncology

## 2022-08-07 ENCOUNTER — Ambulatory Visit
Admission: RE | Admit: 2022-08-07 | Discharge: 2022-08-07 | Disposition: A | Discharge status: Home / Self Care | Payer: Medicare Other | Source: Ambulatory Visit | Attending: Radiation Oncology | Admitting: Radiation Oncology

## 2022-08-07 ENCOUNTER — Other Ambulatory Visit: Payer: Self-pay

## 2022-08-07 VITALS — BP 124/88 | HR 77 | Temp 98.1°F | Wt 119.0 lb

## 2022-08-07 DIAGNOSIS — C20 Malignant neoplasm of rectum: Secondary | ICD-10-CM | POA: Insufficient documentation

## 2022-08-07 DIAGNOSIS — M549 Dorsalgia, unspecified: Secondary | ICD-10-CM | POA: Diagnosis not present

## 2022-08-07 DIAGNOSIS — Z510 Encounter for antineoplastic radiation therapy: Secondary | ICD-10-CM | POA: Diagnosis not present

## 2022-08-07 DIAGNOSIS — D709 Neutropenia, unspecified: Secondary | ICD-10-CM | POA: Diagnosis not present

## 2022-08-07 DIAGNOSIS — D649 Anemia, unspecified: Secondary | ICD-10-CM | POA: Insufficient documentation

## 2022-08-07 DIAGNOSIS — K1379 Other lesions of oral mucosa: Secondary | ICD-10-CM | POA: Diagnosis not present

## 2022-08-07 DIAGNOSIS — E871 Hypo-osmolality and hyponatremia: Secondary | ICD-10-CM | POA: Insufficient documentation

## 2022-08-07 DIAGNOSIS — Z720 Tobacco use: Secondary | ICD-10-CM | POA: Diagnosis not present

## 2022-08-07 DIAGNOSIS — Z8673 Personal history of transient ischemic attack (TIA), and cerebral infarction without residual deficits: Secondary | ICD-10-CM | POA: Insufficient documentation

## 2022-08-07 DIAGNOSIS — Z933 Colostomy status: Secondary | ICD-10-CM | POA: Insufficient documentation

## 2022-08-07 DIAGNOSIS — Z8042 Family history of malignant neoplasm of prostate: Secondary | ICD-10-CM | POA: Insufficient documentation

## 2022-08-07 LAB — CBC WITH DIFFERENTIAL/PLATELET
Abs Immature Granulocytes: 0.03 10*3/uL (ref 0.00–0.07)
Basophils Absolute: 0 10*3/uL (ref 0.0–0.1)
Basophils Relative: 1 %
Eosinophils Absolute: 0.1 10*3/uL (ref 0.0–0.5)
Eosinophils Relative: 1 %
HCT: 31.5 % — ABNORMAL LOW (ref 39.0–52.0)
Hemoglobin: 10.9 g/dL — ABNORMAL LOW (ref 13.0–17.0)
Immature Granulocytes: 1 %
Lymphocytes Relative: 29 %
Lymphs Abs: 1 10*3/uL (ref 0.7–4.0)
MCH: 33.3 pg (ref 26.0–34.0)
MCHC: 34.6 g/dL (ref 30.0–36.0)
MCV: 96.3 fL (ref 80.0–100.0)
Monocytes Absolute: 0.6 10*3/uL (ref 0.1–1.0)
Monocytes Relative: 17 %
Neutro Abs: 1.8 10*3/uL (ref 1.7–7.7)
Neutrophils Relative %: 51 %
Platelets: 181 10*3/uL (ref 150–400)
RBC: 3.27 MIL/uL — ABNORMAL LOW (ref 4.22–5.81)
RDW: 21.7 % — ABNORMAL HIGH (ref 11.5–15.5)
WBC: 3.5 10*3/uL — ABNORMAL LOW (ref 4.0–10.5)
nRBC: 0 % (ref 0.0–0.2)

## 2022-08-07 LAB — RAD ONC ARIA SESSION SUMMARY
Course Elapsed Days: 7
Plan Fractions Treated to Date: 6
Plan Prescribed Dose Per Fraction: 1.8 Gy
Plan Total Fractions Prescribed: 25
Plan Total Prescribed Dose: 45 Gy
Reference Point Dosage Given to Date: 10.8 Gy
Reference Point Session Dosage Given: 1.8 Gy
Session Number: 6

## 2022-08-07 LAB — COMPREHENSIVE METABOLIC PANEL
ALT: 13 U/L (ref 0–44)
AST: 27 U/L (ref 15–41)
Albumin: 2.9 g/dL — ABNORMAL LOW (ref 3.5–5.0)
Alkaline Phosphatase: 74 U/L (ref 38–126)
Anion gap: 6 (ref 5–15)
BUN: 7 mg/dL — ABNORMAL LOW (ref 8–23)
CO2: 25 mmol/L (ref 22–32)
Calcium: 8.3 mg/dL — ABNORMAL LOW (ref 8.9–10.3)
Chloride: 102 mmol/L (ref 98–111)
Creatinine, Ser: 0.32 mg/dL — ABNORMAL LOW (ref 0.61–1.24)
GFR, Estimated: >60 mL/min (ref >60)
Glucose, Bld: 104 mg/dL — ABNORMAL HIGH (ref 70–99)
Potassium: 3.5 mmol/L (ref 3.5–5.1)
Sodium: 133 mmol/L — ABNORMAL LOW (ref 135–145)
Total Bilirubin: 0.7 mg/dL (ref 0.3–1.2)
Total Protein: 6.3 g/dL — ABNORMAL LOW (ref 6.5–8.1)

## 2022-08-07 MED ORDER — SODIUM CHLORIDE 0.9% FLUSH
10.0000 mL | Freq: Once | INTRAVENOUS | Status: AC
  Administered 2022-08-07: 10 mL via INTRAVENOUS
  Filled 2022-08-07: qty 10

## 2022-08-07 MED ORDER — HEPARIN SOD (PORK) LOCK FLUSH 100 UNIT/ML IV SOLN
500.0000 [IU] | Freq: Once | INTRAVENOUS | Status: AC
  Administered 2022-08-07: 500 [IU] via INTRAVENOUS
  Filled 2022-08-07: qty 5

## 2022-08-07 MED ORDER — OXYCODONE HCL 10 MG PO TABS: 10.0000 mg | ORAL_TABLET | Freq: Four times a day (QID) | ORAL | 0 refills | Status: DC | PRN

## 2022-08-07 NOTE — Progress Notes (Signed)
[facility]  Telephone:([phone] [phone]  Patient Care Team: Kyle David, MD as PCP - General (Cardiology) Kyle Jacks, RN as Oncology Nurse Navigator Kyle Huger, MD as Consulting Physician (Oncology)   Name of the patient: Kyle Larson  [id number]  August 06, 1956   Date of visit: 08/07/22  HPI: Patient is a 66 y.o. male with stage IIa rectal cancer. He has finished neoadjuvant treatment with FOLFOX. He started chemoradation on 07/31/22.     Reason for Consult: Oral chemotherapy follow-up for capecitabine therapy.   PAST MEDICAL HISTORY: Past Medical History:  Diagnosis Date   AC joint dislocation    CAD (coronary artery disease)    CVA (cerebral vascular accident) ([place])    Metatarsal bone fracture    3rd metatarsal , left foot; April 2021   Syncope    June '22   Tobacco abuse     HEMATOLOGY/ONCOLOGY HISTORY:  Oncology History  Adenocarcinoma of rectum ([name])  02/25/2022 Initial Diagnosis   Adenocarcinoma of rectum ([name])   03/10/2022 Cancer Staging   Staging form: Colon and Rectum, AJCC 8th Edition - Clinical stage from 03/10/2022: Stage IIA (cT3, cN0, cM0) - Signed by Kyle Huger, MD on 03/10/2022 Stage prefix: Initial diagnosis Total positive nodes: 0   03/12/2022 - 07/11/2022 Chemotherapy   Patient is on Treatment Plan : COLORECTAL FOLFOX q14d x 4 months       ALLERGIES:  has No Known Allergies.  MEDICATIONS:  Current Outpatient Medications  Medication Sig Dispense Refill   acetaminophen (TYLENOL) 325 MG tablet Take 2 tablets (650 mg total) by mouth every 6 (six) hours as needed for mild pain.     ALPRAZolam (XANAX) 0.25 MG tablet Take 1 tablet (0.25 mg total) by mouth at bedtime as needed for sleep. 30 tablet 0   atorvastatin (LIPITOR) 40 MG tablet Take 1 tablet (40 mg total) by mouth daily. 30 tablet 0   capecitabine (XELODA) 150 MG tablet Take 2 tablets (300 mg total) by mouth 2  (two) times daily after a meal. Take Monday-Friday. Take only on days of radiation. Take along with '500mg'$  tablets. 100 tablet 0   capecitabine (XELODA) 500 MG tablet Take 2 tablets (1,000 mg total) by mouth 2 (two) times daily after a meal. Take Monday-Friday. Take only on days of radiation. Take along with '150mg'$  tablets. 100 tablet 0   feeding supplement (ENSURE ENLIVE / ENSURE PLUS) LIQD Take 237 mLs by mouth 3 (three) times daily between meals. 21330 mL 0   fentaNYL (DURAGESIC) 50 MCG/HR Place 1 patch onto the skin every 3 (three) days. 10 patch 0   ferrous sulfate 325 (65 FE) MG tablet Take 1 tablet (325 mg total) by mouth daily. 30 tablet 0   folic acid (FOLVITE) 1 MG tablet Take 1 tablet (1 mg total) by mouth daily. 30 tablet 0   lidocaine-prilocaine (EMLA) cream Apply to affected area once 30 g 3   lidocaine-prilocaine (EMLA) cream Apply 1 application  topically as needed. Apply cream to port 1 hour prior to chemotherapy. Cover with plastic wrap. 30 g 3   Multiple Vitamins-Minerals (MULTIVITAMIN WITH MINERALS) tablet Take 1 tablet by mouth daily.     nicotine (NICODERM CQ - DOSED IN MG/24 HOURS) 21 mg/24hr patch Place 21 mg onto the skin daily. (Patient not taking: Reported on 07/16/2022)     ondansetron (ZOFRAN) 8 MG tablet TAKE 1 TABLET 2 TIMES DAILY AS NEEDED FOR REFRACTORY NAUSEA / VOMITING.  START DAY 3 AFTER CHEMO 60 tablet 2   oxyCODONE (OXY IR/ROXICODONE) 5 MG immediate release tablet Take 1 - 2 tablets every 4 hours as needed. 120 tablet 0   pantoprazole (PROTONIX) 40 MG tablet Take 1 tablet (40 mg total) by mouth daily. 30 tablet 2   polyethylene glycol (MIRALAX / GLYCOLAX) 17 g packet Take 17 g by mouth daily.     prochlorperazine (COMPAZINE) 10 MG tablet Take 1 tablet (10 mg total) by mouth every 6 (six) hours as needed (Nausea or vomiting). (Patient not taking: Reported on 06/18/2022) 60 tablet 2   sildenafil (VIAGRA) 25 MG tablet Take 1 tablet (25 mg total) by mouth daily as needed  for erectile dysfunction. 10 tablet 0   sotalol (BETAPACE) 80 MG tablet Take 80 mg by mouth daily.     tamsulosin (FLOMAX) 0.4 MG CAPS capsule Take 2 capsules (0.8 mg total) by mouth daily. 30 capsule 11   thiamine 100 MG tablet Take 1 tablet (100 mg total) by mouth daily. 30 tablet 0   No current facility-administered medications for this visit.    VITAL SIGNS: There were no vitals taken for this visit. There were no vitals filed for this visit.  Estimated body mass index is 16.14 kg/m as calculated from the following:   Height as of 07/09/22: 6' (1.829 m).   Weight as of an earlier encounter on 08/07/22: 54 kg (119 lb).  LABS: CBC:    Component Value Date/Time   WBC 3.5 (L) 08/07/2022 1441   HGB 10.9 (L) 08/07/2022 1441   HCT 31.5 (L) 08/07/2022 1441   PLT 181 08/07/2022 1441   MCV 96.3 08/07/2022 1441   NEUTROABS 1.8 08/07/2022 1441   LYMPHSABS 1.0 08/07/2022 1441   MONOABS 0.6 08/07/2022 1441   EOSABS 0.1 08/07/2022 1441   BASOSABS 0.0 08/07/2022 1441   Comprehensive Metabolic Panel:    Component Value Date/Time   NA 133 (L) 08/07/2022 1441   K 3.5 08/07/2022 1441   CL 102 08/07/2022 1441   CO2 25 08/07/2022 1441   BUN 7 (L) 08/07/2022 1441   CREATININE 0.32 (L) 08/07/2022 1441   GLUCOSE 104 (H) 08/07/2022 1441   CALCIUM 8.3 (L) 08/07/2022 1441   AST 27 08/07/2022 1441   ALT 13 08/07/2022 1441   ALKPHOS 74 08/07/2022 1441   BILITOT 0.7 08/07/2022 1441   PROT 6.3 (L) 08/07/2022 1441   ALBUMIN 2.9 (L) 08/07/2022 1441     Present during today's visit: Patient and his wife   Assessment and Plan: CMP/CBC reviewed. Continue capecitabine '1300mg'$  twice daily with radiation treatment    Oral Chemotherapy Side Effect/Intolerance:  Bowel movements: Patient reports clear mucus when using the restroom. Patient is otherwise using ostomy bag with minimal complications.  Fatigue: Patient endorses decreased leg strength and endurance since starting regimen. Still able to  complete normal ADLs with some ambulation at home.  Mouth sores: Patient reports mouth sores have improved since stopping FOLFOX. He has obtained dental insurance since last visit with plans to establish care in the future.  No reports of hand-foot-syndrome, edema, or nausea   Oral Chemotherapy Adherence: No missed doses  No patient barriers to medication adherence identified.   New medications: None  Medication Access Issues: None  Patient expressed understanding and was in agreement with this plan. He also understands that He can call clinic at any time with any questions, concerns, or complaints.   Follow-up plan: RTC 2 weeks   Thank you for allowing  me to participate in the care of this very pleasant patient.   Time Total: 15 minutes  Visit consisted of counseling and education on dealing with issues of symptom management in the setting of serious and potentially life-threatening illness.Greater than 50%  of this time was spent counseling and coordinating care related to the above assessment and plan.  Signed by: Darl Pikes, PharmD, BCPS, Salley Slaughter, CPP Hematology/Oncology Clinical Pharmacist Practitioner [place]/DB/AP [facility] [phone]  08/07/2022 3:44 PM

## 2022-08-08 ENCOUNTER — Other Ambulatory Visit: Payer: Self-pay

## 2022-08-08 ENCOUNTER — Ambulatory Visit
Admission: RE | Admit: 2022-08-08 | Discharge: 2022-08-08 | Disposition: A | Discharge status: Home / Self Care | Payer: Medicare Other | Source: Ambulatory Visit | Attending: Radiation Oncology | Admitting: Radiation Oncology

## 2022-08-08 DIAGNOSIS — D649 Anemia, unspecified: Secondary | ICD-10-CM | POA: Diagnosis not present

## 2022-08-08 DIAGNOSIS — Z8673 Personal history of transient ischemic attack (TIA), and cerebral infarction without residual deficits: Secondary | ICD-10-CM | POA: Diagnosis not present

## 2022-08-08 DIAGNOSIS — D709 Neutropenia, unspecified: Secondary | ICD-10-CM | POA: Diagnosis not present

## 2022-08-08 DIAGNOSIS — E871 Hypo-osmolality and hyponatremia: Secondary | ICD-10-CM | POA: Diagnosis not present

## 2022-08-08 DIAGNOSIS — Z933 Colostomy status: Secondary | ICD-10-CM | POA: Diagnosis not present

## 2022-08-08 DIAGNOSIS — C20 Malignant neoplasm of rectum: Secondary | ICD-10-CM | POA: Diagnosis not present

## 2022-08-08 DIAGNOSIS — Z510 Encounter for antineoplastic radiation therapy: Secondary | ICD-10-CM | POA: Diagnosis not present

## 2022-08-08 DIAGNOSIS — Z720 Tobacco use: Secondary | ICD-10-CM | POA: Diagnosis not present

## 2022-08-08 DIAGNOSIS — M549 Dorsalgia, unspecified: Secondary | ICD-10-CM | POA: Diagnosis not present

## 2022-08-08 DIAGNOSIS — K1379 Other lesions of oral mucosa: Secondary | ICD-10-CM | POA: Diagnosis not present

## 2022-08-08 LAB — RAD ONC ARIA SESSION SUMMARY
Course Elapsed Days: 8
Plan Fractions Treated to Date: 7
Plan Prescribed Dose Per Fraction: 1.8 Gy
Plan Total Fractions Prescribed: 25
Plan Total Prescribed Dose: 45 Gy
Reference Point Dosage Given to Date: 12.6 Gy
Reference Point Session Dosage Given: 1.8 Gy
Session Number: 7

## 2022-08-11 ENCOUNTER — Ambulatory Visit
Admission: RE | Admit: 2022-08-11 | Discharge: 2022-08-11 | Disposition: A | Discharge status: Home / Self Care | Payer: Medicare Other | Source: Ambulatory Visit | Attending: Radiation Oncology | Admitting: Radiation Oncology

## 2022-08-11 ENCOUNTER — Other Ambulatory Visit: Payer: Self-pay

## 2022-08-11 DIAGNOSIS — Z720 Tobacco use: Secondary | ICD-10-CM | POA: Diagnosis not present

## 2022-08-11 DIAGNOSIS — C20 Malignant neoplasm of rectum: Secondary | ICD-10-CM | POA: Diagnosis not present

## 2022-08-11 DIAGNOSIS — Z8673 Personal history of transient ischemic attack (TIA), and cerebral infarction without residual deficits: Secondary | ICD-10-CM | POA: Diagnosis not present

## 2022-08-11 DIAGNOSIS — Z510 Encounter for antineoplastic radiation therapy: Secondary | ICD-10-CM | POA: Diagnosis not present

## 2022-08-11 DIAGNOSIS — K1379 Other lesions of oral mucosa: Secondary | ICD-10-CM | POA: Diagnosis not present

## 2022-08-11 DIAGNOSIS — D649 Anemia, unspecified: Secondary | ICD-10-CM | POA: Diagnosis not present

## 2022-08-11 DIAGNOSIS — M549 Dorsalgia, unspecified: Secondary | ICD-10-CM | POA: Diagnosis not present

## 2022-08-11 DIAGNOSIS — E871 Hypo-osmolality and hyponatremia: Secondary | ICD-10-CM | POA: Diagnosis not present

## 2022-08-11 DIAGNOSIS — Z933 Colostomy status: Secondary | ICD-10-CM | POA: Diagnosis not present

## 2022-08-11 DIAGNOSIS — D709 Neutropenia, unspecified: Secondary | ICD-10-CM | POA: Diagnosis not present

## 2022-08-11 LAB — RAD ONC ARIA SESSION SUMMARY
Course Elapsed Days: 11
Plan Fractions Treated to Date: 8
Plan Prescribed Dose Per Fraction: 1.8 Gy
Plan Total Fractions Prescribed: 25
Plan Total Prescribed Dose: 45 Gy
Reference Point Dosage Given to Date: 14.4 Gy
Reference Point Session Dosage Given: 1.8 Gy
Session Number: 8

## 2022-08-12 ENCOUNTER — Other Ambulatory Visit: Payer: Self-pay

## 2022-08-12 ENCOUNTER — Ambulatory Visit
Admission: RE | Admit: 2022-08-12 | Discharge: 2022-08-12 | Disposition: A | Discharge status: Home / Self Care | Payer: Medicare Other | Source: Ambulatory Visit | Attending: Radiation Oncology | Admitting: Radiation Oncology

## 2022-08-12 DIAGNOSIS — E871 Hypo-osmolality and hyponatremia: Secondary | ICD-10-CM | POA: Diagnosis not present

## 2022-08-12 DIAGNOSIS — C20 Malignant neoplasm of rectum: Secondary | ICD-10-CM | POA: Diagnosis not present

## 2022-08-12 DIAGNOSIS — Z8673 Personal history of transient ischemic attack (TIA), and cerebral infarction without residual deficits: Secondary | ICD-10-CM | POA: Diagnosis not present

## 2022-08-12 DIAGNOSIS — Z720 Tobacco use: Secondary | ICD-10-CM | POA: Diagnosis not present

## 2022-08-12 DIAGNOSIS — Z510 Encounter for antineoplastic radiation therapy: Secondary | ICD-10-CM | POA: Diagnosis not present

## 2022-08-12 DIAGNOSIS — D649 Anemia, unspecified: Secondary | ICD-10-CM | POA: Diagnosis not present

## 2022-08-12 DIAGNOSIS — M549 Dorsalgia, unspecified: Secondary | ICD-10-CM | POA: Diagnosis not present

## 2022-08-12 DIAGNOSIS — D709 Neutropenia, unspecified: Secondary | ICD-10-CM | POA: Diagnosis not present

## 2022-08-12 DIAGNOSIS — K1379 Other lesions of oral mucosa: Secondary | ICD-10-CM | POA: Diagnosis not present

## 2022-08-12 DIAGNOSIS — Z933 Colostomy status: Secondary | ICD-10-CM | POA: Diagnosis not present

## 2022-08-12 LAB — RAD ONC ARIA SESSION SUMMARY
Course Elapsed Days: 12
Plan Fractions Treated to Date: 9
Plan Prescribed Dose Per Fraction: 1.8 Gy
Plan Total Fractions Prescribed: 25
Plan Total Prescribed Dose: 45 Gy
Reference Point Dosage Given to Date: 16.2 Gy
Reference Point Session Dosage Given: 1.8 Gy
Session Number: 9

## 2022-08-13 ENCOUNTER — Other Ambulatory Visit (HOSPITAL_COMMUNITY): Payer: Self-pay

## 2022-08-13 ENCOUNTER — Other Ambulatory Visit: Payer: Self-pay

## 2022-08-13 ENCOUNTER — Ambulatory Visit
Admission: RE | Admit: 2022-08-13 | Discharge: 2022-08-13 | Disposition: A | Discharge status: Home / Self Care | Payer: Medicare Other | Source: Ambulatory Visit | Attending: Radiation Oncology | Admitting: Radiation Oncology

## 2022-08-13 DIAGNOSIS — D649 Anemia, unspecified: Secondary | ICD-10-CM | POA: Diagnosis not present

## 2022-08-13 DIAGNOSIS — D709 Neutropenia, unspecified: Secondary | ICD-10-CM | POA: Diagnosis not present

## 2022-08-13 DIAGNOSIS — Z8673 Personal history of transient ischemic attack (TIA), and cerebral infarction without residual deficits: Secondary | ICD-10-CM | POA: Diagnosis not present

## 2022-08-13 DIAGNOSIS — Z510 Encounter for antineoplastic radiation therapy: Secondary | ICD-10-CM | POA: Diagnosis not present

## 2022-08-13 DIAGNOSIS — E871 Hypo-osmolality and hyponatremia: Secondary | ICD-10-CM | POA: Diagnosis not present

## 2022-08-13 DIAGNOSIS — M549 Dorsalgia, unspecified: Secondary | ICD-10-CM | POA: Diagnosis not present

## 2022-08-13 DIAGNOSIS — Z720 Tobacco use: Secondary | ICD-10-CM | POA: Diagnosis not present

## 2022-08-13 DIAGNOSIS — C20 Malignant neoplasm of rectum: Secondary | ICD-10-CM | POA: Diagnosis not present

## 2022-08-13 DIAGNOSIS — Z933 Colostomy status: Secondary | ICD-10-CM | POA: Diagnosis not present

## 2022-08-13 DIAGNOSIS — K1379 Other lesions of oral mucosa: Secondary | ICD-10-CM | POA: Diagnosis not present

## 2022-08-13 LAB — RAD ONC ARIA SESSION SUMMARY
Course Elapsed Days: 13
Plan Fractions Treated to Date: 10
Plan Prescribed Dose Per Fraction: 1.8 Gy
Plan Total Fractions Prescribed: 25
Plan Total Prescribed Dose: 45 Gy
Reference Point Dosage Given to Date: 18 Gy
Reference Point Session Dosage Given: 1.8 Gy
Session Number: 10

## 2022-08-14 ENCOUNTER — Ambulatory Visit
Admission: RE | Admit: 2022-08-14 | Discharge: 2022-08-14 | Disposition: A | Discharge status: Home / Self Care | Payer: Medicare Other | Source: Ambulatory Visit | Attending: Radiation Oncology | Admitting: Radiation Oncology

## 2022-08-14 ENCOUNTER — Other Ambulatory Visit: Payer: Self-pay

## 2022-08-14 DIAGNOSIS — C20 Malignant neoplasm of rectum: Secondary | ICD-10-CM | POA: Diagnosis not present

## 2022-08-14 DIAGNOSIS — Z510 Encounter for antineoplastic radiation therapy: Secondary | ICD-10-CM | POA: Diagnosis not present

## 2022-08-14 DIAGNOSIS — Z8673 Personal history of transient ischemic attack (TIA), and cerebral infarction without residual deficits: Secondary | ICD-10-CM | POA: Diagnosis not present

## 2022-08-14 DIAGNOSIS — E871 Hypo-osmolality and hyponatremia: Secondary | ICD-10-CM | POA: Diagnosis not present

## 2022-08-14 DIAGNOSIS — Z933 Colostomy status: Secondary | ICD-10-CM | POA: Diagnosis not present

## 2022-08-14 DIAGNOSIS — D649 Anemia, unspecified: Secondary | ICD-10-CM | POA: Diagnosis not present

## 2022-08-14 DIAGNOSIS — D709 Neutropenia, unspecified: Secondary | ICD-10-CM | POA: Diagnosis not present

## 2022-08-14 DIAGNOSIS — Z720 Tobacco use: Secondary | ICD-10-CM | POA: Diagnosis not present

## 2022-08-14 DIAGNOSIS — K1379 Other lesions of oral mucosa: Secondary | ICD-10-CM | POA: Diagnosis not present

## 2022-08-14 DIAGNOSIS — M549 Dorsalgia, unspecified: Secondary | ICD-10-CM | POA: Diagnosis not present

## 2022-08-14 LAB — RAD ONC ARIA SESSION SUMMARY
Course Elapsed Days: 14
Plan Fractions Treated to Date: 11
Plan Prescribed Dose Per Fraction: 1.8 Gy
Plan Total Fractions Prescribed: 25
Plan Total Prescribed Dose: 45 Gy
Reference Point Dosage Given to Date: 19.8 Gy
Reference Point Session Dosage Given: 1.8 Gy
Session Number: 11

## 2022-08-15 ENCOUNTER — Other Ambulatory Visit: Payer: Self-pay

## 2022-08-15 ENCOUNTER — Other Ambulatory Visit (HOSPITAL_COMMUNITY): Payer: Self-pay

## 2022-08-15 ENCOUNTER — Ambulatory Visit
Admission: RE | Admit: 2022-08-15 | Discharge: 2022-08-15 | Disposition: A | Discharge status: Home / Self Care | Payer: Medicare Other | Source: Ambulatory Visit | Attending: Radiation Oncology | Admitting: Radiation Oncology

## 2022-08-15 DIAGNOSIS — C20 Malignant neoplasm of rectum: Secondary | ICD-10-CM | POA: Diagnosis not present

## 2022-08-15 DIAGNOSIS — D649 Anemia, unspecified: Secondary | ICD-10-CM | POA: Diagnosis not present

## 2022-08-15 DIAGNOSIS — E871 Hypo-osmolality and hyponatremia: Secondary | ICD-10-CM | POA: Diagnosis not present

## 2022-08-15 DIAGNOSIS — D709 Neutropenia, unspecified: Secondary | ICD-10-CM | POA: Diagnosis not present

## 2022-08-15 DIAGNOSIS — Z720 Tobacco use: Secondary | ICD-10-CM | POA: Diagnosis not present

## 2022-08-15 DIAGNOSIS — Z8673 Personal history of transient ischemic attack (TIA), and cerebral infarction without residual deficits: Secondary | ICD-10-CM | POA: Diagnosis not present

## 2022-08-15 DIAGNOSIS — K1379 Other lesions of oral mucosa: Secondary | ICD-10-CM | POA: Diagnosis not present

## 2022-08-15 DIAGNOSIS — M549 Dorsalgia, unspecified: Secondary | ICD-10-CM | POA: Diagnosis not present

## 2022-08-15 DIAGNOSIS — Z510 Encounter for antineoplastic radiation therapy: Secondary | ICD-10-CM | POA: Diagnosis not present

## 2022-08-15 DIAGNOSIS — Z933 Colostomy status: Secondary | ICD-10-CM | POA: Diagnosis not present

## 2022-08-15 LAB — RAD ONC ARIA SESSION SUMMARY
Course Elapsed Days: 15
Plan Fractions Treated to Date: 12
Plan Prescribed Dose Per Fraction: 1.8 Gy
Plan Total Fractions Prescribed: 25
Plan Total Prescribed Dose: 45 Gy
Reference Point Dosage Given to Date: 21.6 Gy
Reference Point Session Dosage Given: 1.8 Gy
Session Number: 12

## 2022-08-18 ENCOUNTER — Other Ambulatory Visit (HOSPITAL_COMMUNITY): Payer: Self-pay

## 2022-08-18 ENCOUNTER — Ambulatory Visit
Admission: RE | Admit: 2022-08-18 | Discharge: 2022-08-18 | Disposition: A | Discharge status: Home / Self Care | Payer: Medicare Other | Source: Ambulatory Visit | Attending: Radiation Oncology | Admitting: Radiation Oncology

## 2022-08-18 ENCOUNTER — Other Ambulatory Visit: Payer: Self-pay

## 2022-08-18 DIAGNOSIS — Z720 Tobacco use: Secondary | ICD-10-CM | POA: Diagnosis not present

## 2022-08-18 DIAGNOSIS — D709 Neutropenia, unspecified: Secondary | ICD-10-CM | POA: Diagnosis not present

## 2022-08-18 DIAGNOSIS — Z510 Encounter for antineoplastic radiation therapy: Secondary | ICD-10-CM | POA: Diagnosis not present

## 2022-08-18 DIAGNOSIS — C20 Malignant neoplasm of rectum: Secondary | ICD-10-CM | POA: Diagnosis not present

## 2022-08-18 DIAGNOSIS — K1379 Other lesions of oral mucosa: Secondary | ICD-10-CM | POA: Diagnosis not present

## 2022-08-18 DIAGNOSIS — Z933 Colostomy status: Secondary | ICD-10-CM | POA: Diagnosis not present

## 2022-08-18 DIAGNOSIS — E871 Hypo-osmolality and hyponatremia: Secondary | ICD-10-CM | POA: Diagnosis not present

## 2022-08-18 DIAGNOSIS — D649 Anemia, unspecified: Secondary | ICD-10-CM | POA: Diagnosis not present

## 2022-08-18 DIAGNOSIS — Z8673 Personal history of transient ischemic attack (TIA), and cerebral infarction without residual deficits: Secondary | ICD-10-CM | POA: Diagnosis not present

## 2022-08-18 DIAGNOSIS — M549 Dorsalgia, unspecified: Secondary | ICD-10-CM | POA: Diagnosis not present

## 2022-08-18 LAB — RAD ONC ARIA SESSION SUMMARY
Course Elapsed Days: 18
Plan Fractions Treated to Date: 13
Plan Prescribed Dose Per Fraction: 1.8 Gy
Plan Total Fractions Prescribed: 25
Plan Total Prescribed Dose: 45 Gy
Reference Point Dosage Given to Date: 23.4 Gy
Reference Point Session Dosage Given: 1.8 Gy
Session Number: 13

## 2022-08-19 ENCOUNTER — Ambulatory Visit
Admission: RE | Admit: 2022-08-19 | Discharge: 2022-08-19 | Disposition: A | Discharge status: Home / Self Care | Payer: Medicare Other | Source: Ambulatory Visit | Attending: Radiation Oncology | Admitting: Radiation Oncology

## 2022-08-19 ENCOUNTER — Other Ambulatory Visit: Payer: Self-pay

## 2022-08-19 DIAGNOSIS — M549 Dorsalgia, unspecified: Secondary | ICD-10-CM | POA: Diagnosis not present

## 2022-08-19 DIAGNOSIS — Z720 Tobacco use: Secondary | ICD-10-CM | POA: Diagnosis not present

## 2022-08-19 DIAGNOSIS — Z510 Encounter for antineoplastic radiation therapy: Secondary | ICD-10-CM | POA: Diagnosis not present

## 2022-08-19 DIAGNOSIS — K1379 Other lesions of oral mucosa: Secondary | ICD-10-CM | POA: Diagnosis not present

## 2022-08-19 DIAGNOSIS — C20 Malignant neoplasm of rectum: Secondary | ICD-10-CM | POA: Diagnosis not present

## 2022-08-19 DIAGNOSIS — E871 Hypo-osmolality and hyponatremia: Secondary | ICD-10-CM | POA: Diagnosis not present

## 2022-08-19 DIAGNOSIS — D649 Anemia, unspecified: Secondary | ICD-10-CM | POA: Diagnosis not present

## 2022-08-19 DIAGNOSIS — Z8673 Personal history of transient ischemic attack (TIA), and cerebral infarction without residual deficits: Secondary | ICD-10-CM | POA: Diagnosis not present

## 2022-08-19 DIAGNOSIS — Z933 Colostomy status: Secondary | ICD-10-CM | POA: Diagnosis not present

## 2022-08-19 DIAGNOSIS — D709 Neutropenia, unspecified: Secondary | ICD-10-CM | POA: Diagnosis not present

## 2022-08-19 LAB — RAD ONC ARIA SESSION SUMMARY
Course Elapsed Days: 19
Plan Fractions Treated to Date: 14
Plan Prescribed Dose Per Fraction: 1.8 Gy
Plan Total Fractions Prescribed: 25
Plan Total Prescribed Dose: 45 Gy
Reference Point Dosage Given to Date: 25.2 Gy
Reference Point Session Dosage Given: 1.8 Gy
Session Number: 14

## 2022-08-20 ENCOUNTER — Ambulatory Visit
Admission: RE | Admit: 2022-08-20 | Discharge: 2022-08-20 | Disposition: A | Discharge status: Home / Self Care | Payer: Medicare Other | Source: Ambulatory Visit | Attending: Radiation Oncology | Admitting: Radiation Oncology

## 2022-08-20 ENCOUNTER — Other Ambulatory Visit: Payer: Self-pay

## 2022-08-20 ENCOUNTER — Other Ambulatory Visit: Payer: Self-pay | Admitting: Oncology

## 2022-08-20 ENCOUNTER — Other Ambulatory Visit (HOSPITAL_COMMUNITY): Payer: Self-pay

## 2022-08-20 DIAGNOSIS — K1379 Other lesions of oral mucosa: Secondary | ICD-10-CM | POA: Diagnosis not present

## 2022-08-20 DIAGNOSIS — C20 Malignant neoplasm of rectum: Secondary | ICD-10-CM | POA: Diagnosis not present

## 2022-08-20 DIAGNOSIS — Z933 Colostomy status: Secondary | ICD-10-CM | POA: Diagnosis not present

## 2022-08-20 DIAGNOSIS — Z8673 Personal history of transient ischemic attack (TIA), and cerebral infarction without residual deficits: Secondary | ICD-10-CM | POA: Diagnosis not present

## 2022-08-20 DIAGNOSIS — Z510 Encounter for antineoplastic radiation therapy: Secondary | ICD-10-CM | POA: Diagnosis not present

## 2022-08-20 DIAGNOSIS — Z720 Tobacco use: Secondary | ICD-10-CM | POA: Diagnosis not present

## 2022-08-20 DIAGNOSIS — D709 Neutropenia, unspecified: Secondary | ICD-10-CM | POA: Diagnosis not present

## 2022-08-20 DIAGNOSIS — D649 Anemia, unspecified: Secondary | ICD-10-CM | POA: Diagnosis not present

## 2022-08-20 DIAGNOSIS — E871 Hypo-osmolality and hyponatremia: Secondary | ICD-10-CM | POA: Diagnosis not present

## 2022-08-20 DIAGNOSIS — M549 Dorsalgia, unspecified: Secondary | ICD-10-CM | POA: Diagnosis not present

## 2022-08-20 LAB — RAD ONC ARIA SESSION SUMMARY
Course Elapsed Days: 20
Plan Fractions Treated to Date: 15
Plan Prescribed Dose Per Fraction: 1.8 Gy
Plan Total Fractions Prescribed: 25
Plan Total Prescribed Dose: 45 Gy
Reference Point Dosage Given to Date: 27 Gy
Reference Point Session Dosage Given: 1.8 Gy
Session Number: 15

## 2022-08-20 MED ORDER — ALPRAZOLAM 0.25 MG PO TABS: 0.2500 mg | ORAL_TABLET | Freq: Every evening | ORAL | 0 refills | Status: DC | PRN

## 2022-08-20 MED ORDER — CAPECITABINE 500 MG PO TABS
1000.0000 mg | ORAL_TABLET | Freq: Two times a day (BID) | ORAL | 0 refills | Status: DC
  Filled 2022-08-20: qty 100, 35d supply, fill #0

## 2022-08-20 MED ORDER — OXYCODONE HCL 10 MG PO TABS: 10.0000 mg | ORAL_TABLET | Freq: Four times a day (QID) | ORAL | 0 refills | Status: DC | PRN

## 2022-08-20 MED ORDER — CAPECITABINE 150 MG PO TABS
300.0000 mg | ORAL_TABLET | Freq: Two times a day (BID) | ORAL | 0 refills | Status: DC
  Filled 2022-08-20: qty 100, 35d supply, fill #0

## 2022-08-20 NOTE — Telephone Encounter (Signed)
CBC with Differential Order: [id number] Status: Final result    Visible to patient: Yes (not seen)    Next appt: 08/21/2022 at 03:15 PM in Radiation Oncology ([facility])    Dx: Adenocarcinoma of rectum ([place])    0 Result Notes           Component Ref Range & Units 13 d ago (08/07/22) 4 wk ago (07/23/22) 1 mo ago (07/09/22) 1 mo ago (07/02/22) 2 mo ago (06/18/22) 2 mo ago (06/04/22) 3 mo ago (05/21/22)  WBC 4.0 - 10.5 K/uL 3.5 Low   16.8 High   4.1  4.5  5.8  8.5  3.5 Low    RBC 4.22 - 5.81 MIL/uL 3.27 Low   3.57 Low   3.75 Low   3.97 Low   4.00 Low   4.35  3.76 Low    Hemoglobin 13.0 - 17.0 g/dL 10.9 Low   11.6 Low   11.9 Low   12.4 Low   12.2 Low   13.0  11.0 Low    HCT 39.0 - 52.0 % 31.5 Low   33.4 Low   34.9 Low   36.2 Low   36.7 Low   39.3  32.8 Low    MCV 80.0 - 100.0 fL 96.3  93.6  93.1  91.2  91.8  90.3  87.2   MCH 26.0 - 34.0 pg 33.3  32.5  31.7  31.2  30.5  29.9  29.3   MCHC 30.0 - 36.0 g/dL 34.6  34.7  34.1  34.3  33.2  33.1  33.5   RDW 11.5 - 15.5 % 21.7 High   20.5 High   20.3 High   19.0 High   19.9 High   23.3 High   23.9 High    Platelets 150 - 400 K/uL 181  108 Low   146 Low   64 Low  CM  152  293  293   nRBC 0.0 - 0.2 % 0.0  0.1  0.0  0.0  0.0  0.0  0.0   Neutrophils Relative % % 51  63  '12  24  31  '$ 50  18   Neutro Abs 1.7 - 7.7 K/uL 1.8  10.7 High   0.5 Low   1.1 Low   1.8  4.2  0.6 Low    Lymphocytes Relative % 29  21  61  59  48  35  48   Lymphs Abs 0.7 - 4.0 K/uL 1.0  3.6  2.5  2.7  2.7  3.0  1.6   Monocytes Relative % '17  10  23  13  10  12  30   '$ Monocytes Absolute 0.1 - 1.0 K/uL 0.6  1.6 High   1.0  0.6  0.6  1.0  1.0   Eosinophils Relative % '1  1  2  3  10  1  2   '$ Eosinophils Absolute 0.0 - 0.5 K/uL 0.1  0.2  0.1  0.1  0.6 High   0.1  0.1   Basophils Relative % '1  1  2  1  1  1  1   '$ Basophils Absolute 0.0 - 0.1 K/uL 0.0  0.1  0.1  0.1  0.1  0.1  0.1   Immature Granulocytes % 1  4  0  0  0  1  1   Abs Immature Granulocytes 0.00 - 0.07 K/uL 0.03  0.60 High  CM   0.01 CM  0.01 CM  0.01 CM  0.04 CM  0.05 CM   Comment: Performed at [facility], [address] [place],  [address]  [place]  [facility] CLIN [place] CLIN [place] CLIN [place] CLIN LAB [place] CLIN LAB [place] CLIN LAB [place] [facility] LAB         Specimen Collected: 08/07/22 14:41 Last Resulted: 08/07/22 15:11      Lab Flowsheet    Order Details    View Encounter    Lab and Collection Details    Routing    Result History    View All Conversations on this Encounter      CM=Additional comments      Result Care Coordination   Patient Communication   Add Comments   Add Notifications  Back to Top       Other Results from 08/07/2022   Contains abnormal data Comprehensive metabolic panel Order: [id number] Status: Final result    Visible to patient: Yes (not seen)    Next appt: 08/21/2022 at 03:15 PM in Radiation Oncology ([facility])    Dx: Adenocarcinoma of rectum ([place])    0 Result Notes           Component Ref Range & Units 13 d ago (08/07/22) 4 wk ago (07/23/22) 1 mo ago (07/09/22) 1 mo ago (07/02/22) 2 mo ago (06/18/22) 2 mo ago (06/04/22) 3 mo ago (05/21/22)  Sodium 135 - 145 mmol/L 133 Low   131 Low   132 Low   134 Low   134 Low   133 Low   123 Low    Potassium 3.5 - 5.1 mmol/L 3.5  3.2 Low   3.6  3.1 Low   3.7  4.0  2.8 Low    Chloride 98 - 111 mmol/L 102  99  100  102  102  100  90 Low    CO2 22 - 32 mmol/L '25  25  26  25  26  25  25   '$ Glucose, Bld 70 - 99 mg/dL 104 High   123 High  CM  112 High  CM  127 High  CM  93 CM  123 High  CM  120 High  CM   Comment: Glucose reference range applies only to samples taken after fasting for at least 8 hours.  BUN 8 - 23 mg/dL 7 Low   6 Low   5 Low   <5 Low   <5 Low   7 Low   6 Low    Creatinine, Ser 0.61 - 1.24 mg/dL 0.32 Low   0.57 Low   0.50 Low   0.56 Low   0.50 Low   0.56 Low   0.63   Calcium 8.9 - 10.3 mg/dL 8.3 Low   8.8 Low   8.8 Low   9.1  9.0  9.1  8.4 Low    Total Protein 6.5 - 8.1 g/dL 6.3 Low   6.6  6.9  7.0  7.0  7.1   7.0   Albumin 3.5 - 5.0 g/dL 2.9 Low   3.2 Low   3.1 Low   3.2 Low   3.2 Low   3.1 Low   3.3 Low    AST 15 - 41 U/L 27  38  31  34  49 High   35  31   ALT 0 - 44 U/L '13  16  14  18  26  17  18   '$ Alkaline Phosphatase 38 - 126  U/L 74  106  76  66  82  83  73   Total Bilirubin 0.3 - 1.2 mg/dL 0.7  0.4  0.5  0.4  0.5  0.5  0.7   GFR, Estimated >60 mL/min >60  >60 CM  >60 CM  >60 CM  >60 CM  >60 CM  >60 CM   Comment: (NOTE)  Calculated using the CKD-EPI Creatinine Equation (2021)   Anion gap 5 - '15 6  7 '$ CM  6 CM  7 CM  6 CM  8 CM  8 CM   Comment: Performed at [facility], [address] [place], [place] [address]  Resulting Agency  [facility] CLIN LAB [place] CLIN LAB [place] CLIN LAB [place] CLIN LAB [place] CLIN LAB [place] CLIN LAB [place] [facility] LAB         Specimen Collected: 08/07/22 14:41 Last Resulted: 08/07/22 15:20

## 2022-08-21 ENCOUNTER — Other Ambulatory Visit: Payer: Self-pay

## 2022-08-21 ENCOUNTER — Ambulatory Visit
Admission: RE | Admit: 2022-08-21 | Discharge: 2022-08-21 | Disposition: A | Discharge status: Home / Self Care | Payer: Medicare Other | Source: Ambulatory Visit | Attending: Radiation Oncology | Admitting: Radiation Oncology

## 2022-08-21 DIAGNOSIS — Z8673 Personal history of transient ischemic attack (TIA), and cerebral infarction without residual deficits: Secondary | ICD-10-CM | POA: Diagnosis not present

## 2022-08-21 DIAGNOSIS — K1379 Other lesions of oral mucosa: Secondary | ICD-10-CM | POA: Diagnosis not present

## 2022-08-21 DIAGNOSIS — D709 Neutropenia, unspecified: Secondary | ICD-10-CM | POA: Diagnosis not present

## 2022-08-21 DIAGNOSIS — M549 Dorsalgia, unspecified: Secondary | ICD-10-CM | POA: Diagnosis not present

## 2022-08-21 DIAGNOSIS — C20 Malignant neoplasm of rectum: Secondary | ICD-10-CM | POA: Diagnosis not present

## 2022-08-21 DIAGNOSIS — D649 Anemia, unspecified: Secondary | ICD-10-CM | POA: Diagnosis not present

## 2022-08-21 DIAGNOSIS — Z510 Encounter for antineoplastic radiation therapy: Secondary | ICD-10-CM | POA: Diagnosis not present

## 2022-08-21 DIAGNOSIS — Z933 Colostomy status: Secondary | ICD-10-CM | POA: Diagnosis not present

## 2022-08-21 DIAGNOSIS — Z720 Tobacco use: Secondary | ICD-10-CM | POA: Diagnosis not present

## 2022-08-21 DIAGNOSIS — E871 Hypo-osmolality and hyponatremia: Secondary | ICD-10-CM | POA: Diagnosis not present

## 2022-08-21 LAB — RAD ONC ARIA SESSION SUMMARY
Course Elapsed Days: 21
Plan Fractions Treated to Date: 16
Plan Prescribed Dose Per Fraction: 1.8 Gy
Plan Total Fractions Prescribed: 25
Plan Total Prescribed Dose: 45 Gy
Reference Point Dosage Given to Date: 28.8 Gy
Reference Point Session Dosage Given: 1.8 Gy
Session Number: 16

## 2022-08-22 ENCOUNTER — Other Ambulatory Visit: Payer: Self-pay | Admitting: *Deleted

## 2022-08-22 ENCOUNTER — Ambulatory Visit
Admission: RE | Admit: 2022-08-22 | Discharge: 2022-08-22 | Disposition: A | Discharge status: Home / Self Care | Payer: Medicare Other | Source: Ambulatory Visit | Attending: Radiation Oncology | Admitting: Radiation Oncology

## 2022-08-22 ENCOUNTER — Telehealth: Payer: Self-pay | Admitting: *Deleted

## 2022-08-22 ENCOUNTER — Other Ambulatory Visit: Payer: Self-pay

## 2022-08-22 DIAGNOSIS — M549 Dorsalgia, unspecified: Secondary | ICD-10-CM | POA: Diagnosis not present

## 2022-08-22 DIAGNOSIS — E871 Hypo-osmolality and hyponatremia: Secondary | ICD-10-CM | POA: Diagnosis not present

## 2022-08-22 DIAGNOSIS — C20 Malignant neoplasm of rectum: Secondary | ICD-10-CM | POA: Diagnosis not present

## 2022-08-22 DIAGNOSIS — D649 Anemia, unspecified: Secondary | ICD-10-CM | POA: Diagnosis not present

## 2022-08-22 DIAGNOSIS — Z510 Encounter for antineoplastic radiation therapy: Secondary | ICD-10-CM | POA: Diagnosis not present

## 2022-08-22 DIAGNOSIS — D709 Neutropenia, unspecified: Secondary | ICD-10-CM | POA: Diagnosis not present

## 2022-08-22 DIAGNOSIS — R531 Weakness: Secondary | ICD-10-CM

## 2022-08-22 DIAGNOSIS — R42 Dizziness and giddiness: Secondary | ICD-10-CM

## 2022-08-22 DIAGNOSIS — Z720 Tobacco use: Secondary | ICD-10-CM | POA: Diagnosis not present

## 2022-08-22 DIAGNOSIS — Z8673 Personal history of transient ischemic attack (TIA), and cerebral infarction without residual deficits: Secondary | ICD-10-CM | POA: Diagnosis not present

## 2022-08-22 DIAGNOSIS — K1379 Other lesions of oral mucosa: Secondary | ICD-10-CM | POA: Diagnosis not present

## 2022-08-22 DIAGNOSIS — Z933 Colostomy status: Secondary | ICD-10-CM | POA: Diagnosis not present

## 2022-08-22 LAB — RAD ONC ARIA SESSION SUMMARY
Course Elapsed Days: 22
Plan Fractions Treated to Date: 17
Plan Prescribed Dose Per Fraction: 1.8 Gy
Plan Total Fractions Prescribed: 25
Plan Total Prescribed Dose: 45 Gy
Reference Point Dosage Given to Date: 30.6 Gy
Reference Point Session Dosage Given: 1.8 Gy
Session Number: 17

## 2022-08-22 NOTE — Telephone Encounter (Signed)
Patient called back at 09/29 at 137 pm with his BP readings.  Sitting at rest bp- 109/77;   HR  94 Standing-  bp - 100/81  ; HR  113  I again offered an apt in the clinic today w/ smc, but pt declines at this time. He said he would monitor his symptoms for now.

## 2022-08-22 NOTE — Telephone Encounter (Addendum)
Incoming secure chat from Ranelle Oyster, RN at [phone]. Stating "this patient is weak, is there anyway he can be seen now. I feel like he needs fluids, but the wife cant stay for that. If we cant see him today, can we get him on the schedule for Monday."  Rn/SMC team reviewed chatmessage at 100.  Upon receipt of message - Patient already left clinic. He was not able to stay for full [facility] workup. I personally reached out to patient to discuss his concerns via his wife's cell phone (#- [phone].) pt en route to his residence. He stated that he needed to get home to see his children get off the bus safely. (His kids are ages 44, 43, 4, and 47).  He stated that he felt dizzy and light headed an hour after taking his xeloda this morning. This has never happened to him before. He denies any vomiting/diarrhea. Pt stated that radiation rns did not take his bp this afternoon at the clinic. I advised pt to take his bp as soon as he can on his home machine when able. He stated that he would do this and call me with the bp readings. Pt requested to have IV fluids on Monday and see a provider for his symptoms before his radiation apt. He is unable to come until the afternoon. His wife works in the morning and afternoons (after 11) work better for him. I made an apt to see patient at 1245 on Monday for port labs/ smc - will see Josh, NP and I will put patient on the smc fluid clinic (should iv fluids be needed). Per Cecille Rubin, RN, pt possibly could be worked in sooner on the radiation apt schedule on Monday if needed. (Note: Radiation apt currently at 315 pm). Pt understands that he is develops any other symptoms-dizziness/weakness this weekend, then he would need to go the ER for further work/up evaluation.Otherwise, smc clinic will evaluate him on Monday as discussed.

## 2022-08-25 ENCOUNTER — Inpatient Hospital Stay: Payer: Medicare Other | Attending: Oncology

## 2022-08-25 ENCOUNTER — Other Ambulatory Visit: Payer: Self-pay

## 2022-08-25 ENCOUNTER — Inpatient Hospital Stay (HOSPITAL_BASED_OUTPATIENT_CLINIC_OR_DEPARTMENT_OTHER): Payer: Medicare Other | Admitting: Hospice and Palliative Medicine

## 2022-08-25 ENCOUNTER — Inpatient Hospital Stay: Payer: Medicare Other | Admitting: Pharmacist

## 2022-08-25 ENCOUNTER — Ambulatory Visit
Admission: RE | Admit: 2022-08-25 | Discharge: 2022-08-25 | Disposition: A | Discharge status: Home / Self Care | Payer: Medicare Other | Source: Ambulatory Visit | Attending: Radiation Oncology | Admitting: Radiation Oncology

## 2022-08-25 ENCOUNTER — Inpatient Hospital Stay: Payer: Medicare Other

## 2022-08-25 VITALS — BP 110/70 | HR 90

## 2022-08-25 DIAGNOSIS — C20 Malignant neoplasm of rectum: Secondary | ICD-10-CM | POA: Insufficient documentation

## 2022-08-25 DIAGNOSIS — R42 Dizziness and giddiness: Secondary | ICD-10-CM

## 2022-08-25 DIAGNOSIS — R531 Weakness: Secondary | ICD-10-CM | POA: Diagnosis not present

## 2022-08-25 DIAGNOSIS — E860 Dehydration: Secondary | ICD-10-CM

## 2022-08-25 DIAGNOSIS — Z95828 Presence of other vascular implants and grafts: Secondary | ICD-10-CM

## 2022-08-25 DIAGNOSIS — Z510 Encounter for antineoplastic radiation therapy: Secondary | ICD-10-CM | POA: Diagnosis not present

## 2022-08-25 LAB — COMPREHENSIVE METABOLIC PANEL
ALT: 12 U/L (ref 0–44)
AST: 24 U/L (ref 15–41)
Albumin: 3 g/dL — ABNORMAL LOW (ref 3.5–5.0)
Alkaline Phosphatase: 75 U/L (ref 38–126)
Anion gap: 5 (ref 5–15)
BUN: 5 mg/dL — ABNORMAL LOW (ref 8–23)
CO2: 27 mmol/L (ref 22–32)
Calcium: 8.3 mg/dL — ABNORMAL LOW (ref 8.9–10.3)
Chloride: 101 mmol/L (ref 98–111)
Creatinine, Ser: 0.38 mg/dL — ABNORMAL LOW (ref 0.61–1.24)
GFR, Estimated: >60 mL/min (ref >60)
Glucose, Bld: 117 mg/dL — ABNORMAL HIGH (ref 70–99)
Potassium: 3.5 mmol/L (ref 3.5–5.1)
Sodium: 133 mmol/L — ABNORMAL LOW (ref 135–145)
Total Bilirubin: 0.4 mg/dL (ref 0.3–1.2)
Total Protein: 6.2 g/dL — ABNORMAL LOW (ref 6.5–8.1)

## 2022-08-25 LAB — CBC WITH DIFFERENTIAL/PLATELET
Abs Immature Granulocytes: 0.02 10*3/uL (ref 0.00–0.07)
Basophils Absolute: 0 10*3/uL (ref 0.0–0.1)
Basophils Relative: 1 %
Eosinophils Absolute: 0.7 10*3/uL — ABNORMAL HIGH (ref 0.0–0.5)
Eosinophils Relative: 11 %
HCT: 30.3 % — ABNORMAL LOW (ref 39.0–52.0)
Hemoglobin: 10.5 g/dL — ABNORMAL LOW (ref 13.0–17.0)
Immature Granulocytes: 0 %
Lymphocytes Relative: 11 %
Lymphs Abs: 0.7 10*3/uL (ref 0.7–4.0)
MCH: 35.1 pg — ABNORMAL HIGH (ref 26.0–34.0)
MCHC: 34.7 g/dL (ref 30.0–36.0)
MCV: 101.3 fL — ABNORMAL HIGH (ref 80.0–100.0)
Monocytes Absolute: 0.9 10*3/uL (ref 0.1–1.0)
Monocytes Relative: 14 %
Neutro Abs: 4 10*3/uL (ref 1.7–7.7)
Neutrophils Relative %: 63 %
Platelets: 168 10*3/uL (ref 150–400)
RBC: 2.99 MIL/uL — ABNORMAL LOW (ref 4.22–5.81)
RDW: 23.5 % — ABNORMAL HIGH (ref 11.5–15.5)
WBC: 6.3 10*3/uL (ref 4.0–10.5)
nRBC: 0 % (ref 0.0–0.2)

## 2022-08-25 LAB — RAD ONC ARIA SESSION SUMMARY
Course Elapsed Days: 25
Plan Fractions Treated to Date: 18
Plan Prescribed Dose Per Fraction: 1.8 Gy
Plan Total Fractions Prescribed: 25
Plan Total Prescribed Dose: 45 Gy
Reference Point Dosage Given to Date: 32.4 Gy
Reference Point Session Dosage Given: 1.8 Gy
Session Number: 18

## 2022-08-25 LAB — MAGNESIUM: Magnesium: 1.7 mg/dL (ref 1.7–2.4)

## 2022-08-25 MED ORDER — HEPARIN SOD (PORK) LOCK FLUSH 100 UNIT/ML IV SOLN
500.0000 [IU] | Freq: Once | INTRAVENOUS | Status: AC
  Administered 2022-08-25: 500 [IU] via INTRAVENOUS
  Filled 2022-08-25: qty 5

## 2022-08-25 MED ORDER — FENTANYL 25 MCG/HR TD PT72: 1.0000 | MEDICATED_PATCH | TRANSDERMAL | 0 refills | Status: DC

## 2022-08-25 MED ORDER — SODIUM CHLORIDE 0.9 % IV SOLN
INTRAVENOUS | Status: DC
  Filled 2022-08-25 (×2): qty 250

## 2022-08-25 MED ORDER — SODIUM CHLORIDE 0.9% FLUSH
10.0000 mL | Freq: Once | INTRAVENOUS | Status: AC
  Administered 2022-08-25: 10 mL via INTRAVENOUS
  Filled 2022-08-25: qty 10

## 2022-08-25 NOTE — Progress Notes (Signed)
[facility]  [phone] [phone]  Patient Care Team: [name], MD as PCP - General (Cardiology) Clent Jacks, RN as Oncology Nurse Navigator Lloyd Huger, MD as Consulting Physician (Oncology)   Name of the patient: Kyle Larson  [id number]  1956/01/28   Date of visit: 08/25/22  HPI: Patient is a 66 y.o. male with stage IIa rectal cancer. He has finished neoadjuvant treatment with FOLFOX. He started chemoradation on 07/31/22.     Reason for Consult: Patient reported dizziness after taking capecitabine, CPP asked to see patient as an add-on  PAST MEDICAL HISTORY: Past Medical History:  Diagnosis Date   AC joint dislocation    CAD (coronary artery disease)    CVA (cerebral vascular accident) ([place])    Metatarsal bone fracture    3rd metatarsal , left foot; April 2021   Syncope    June '22   Tobacco abuse     HEMATOLOGY/ONCOLOGY HISTORY:  Oncology History  Adenocarcinoma of rectum ([place])  02/25/2022 Initial Diagnosis   Adenocarcinoma of rectum ([place])   03/10/2022 Cancer Staging   Staging form: Colon and Rectum, AJCC 8th Edition - Clinical stage from 03/10/2022: Stage IIA (cT3, cN0, cM0) - Signed by Lloyd Huger, MD on 03/10/2022 Stage prefix: Initial diagnosis Total positive nodes: 0   03/12/2022 - 07/11/2022 Chemotherapy   Patient is on Treatment Plan : COLORECTAL FOLFOX q14d x 4 months       ALLERGIES:  has No Known Allergies.  MEDICATIONS:  Current Outpatient Medications  Medication Sig Dispense Refill   acetaminophen (TYLENOL) 325 MG tablet Take 2 tablets (650 mg total) by mouth every 6 (six) hours as needed for mild pain.     ALPRAZolam (XANAX) 0.25 MG tablet Take 1 tablet (0.25 mg total) by mouth at bedtime as needed for sleep. 30 tablet 0   atorvastatin (LIPITOR) 40 MG tablet Take 1 tablet (40 mg total) by mouth daily. 30 tablet 0   capecitabine (XELODA) 150 MG tablet Take 2  tablets (300 mg total) by mouth 2 (two) times daily after a meal. Take Monday-Friday. Take only on days of radiation. Take along with '500mg'$  tablets. 100 tablet 0   capecitabine (XELODA) 500 MG tablet Take 2 tablets (1,000 mg total) by mouth 2 (two) times daily after a meal. Take Monday-Friday. Take only on days of radiation. Take along with '150mg'$  tablets. 100 tablet 0   feeding supplement (ENSURE ENLIVE / ENSURE PLUS) LIQD Take 237 mLs by mouth 3 (three) times daily between meals. 21330 mL 0   fentaNYL (DURAGESIC) 50 MCG/HR Place 1 patch onto the skin every 3 (three) days. 10 patch 0   ferrous sulfate 325 (65 FE) MG tablet Take 1 tablet (325 mg total) by mouth daily. 30 tablet 0   folic acid (FOLVITE) 1 MG tablet Take 1 tablet (1 mg total) by mouth daily. 30 tablet 0   lidocaine-prilocaine (EMLA) cream Apply to affected area once 30 g 3   lidocaine-prilocaine (EMLA) cream Apply 1 application  topically as needed. Apply cream to port 1 hour prior to chemotherapy. Cover with plastic wrap. 30 g 3   Multiple Vitamins-Minerals (MULTIVITAMIN WITH MINERALS) tablet Take 1 tablet by mouth daily.     nicotine (NICODERM CQ - DOSED IN MG/24 HOURS) 21 mg/24hr patch Place 21 mg onto the skin daily. (Patient not taking: Reported on 07/16/2022)     ondansetron (ZOFRAN) 8 MG tablet TAKE 1 TABLET 2 TIMES DAILY  AS NEEDED FOR REFRACTORY NAUSEA / VOMITING. START DAY 3 AFTER CHEMO 60 tablet 2   Oxycodone HCl 10 MG TABS Take 1 tablet (10 mg total) by mouth every 6 (six) hours as needed. Okay to take 1/2 tablet as needed. 60 tablet 0   pantoprazole (PROTONIX) 40 MG tablet Take 1 tablet (40 mg total) by mouth daily. 30 tablet 2   polyethylene glycol (MIRALAX / GLYCOLAX) 17 g packet Take 17 g by mouth daily.     prochlorperazine (COMPAZINE) 10 MG tablet Take 1 tablet (10 mg total) by mouth every 6 (six) hours as needed (Nausea or vomiting). (Patient not taking: Reported on 06/18/2022) 60 tablet 2   sildenafil (VIAGRA) 25 MG  tablet Take 1 tablet (25 mg total) by mouth daily as needed for erectile dysfunction. 10 tablet 0   sotalol (BETAPACE) 80 MG tablet Take 80 mg by mouth daily.     tamsulosin (FLOMAX) 0.4 MG CAPS capsule Take 2 capsules (0.8 mg total) by mouth daily. 30 capsule 11   thiamine 100 MG tablet Take 1 tablet (100 mg total) by mouth daily. 30 tablet 0   No current facility-administered medications for this visit.   Facility-Administered Medications Ordered in Other Visits  Medication Dose Route Frequency Provider Last Rate Last Admin   0.9 %  sodium chloride infusion   Intravenous Continuous Borders, Kirt Boys, NP   Stopped at 08/25/22 1427    VITAL SIGNS: There were no vitals taken for this visit. There were no vitals filed for this visit.  Estimated body mass index is 16.14 kg/m as calculated from the following:   Height as of an earlier encounter on 08/25/22: 6' (1.829 m).   Weight as of an earlier encounter on 08/25/22: 54 kg (119 lb).  LABS: CBC:    Component Value Date/Time   WBC 6.3 08/25/2022 1337   HGB 10.5 (L) 08/25/2022 1337   HCT 30.3 (L) 08/25/2022 1337   PLT 168 08/25/2022 1337   MCV 101.3 (H) 08/25/2022 1337   NEUTROABS 4.0 08/25/2022 1337   LYMPHSABS 0.7 08/25/2022 1337   MONOABS 0.9 08/25/2022 1337   EOSABS 0.7 (H) 08/25/2022 1337   BASOSABS 0.0 08/25/2022 1337   Comprehensive Metabolic Panel:    Component Value Date/Time   NA 133 (L) 08/25/2022 1337   K 3.5 08/25/2022 1337   CL 101 08/25/2022 1337   CO2 27 08/25/2022 1337   BUN 5 (L) 08/25/2022 1337   CREATININE 0.38 (L) 08/25/2022 1337   GLUCOSE 117 (H) 08/25/2022 1337   CALCIUM 8.3 (L) 08/25/2022 1337   AST 24 08/25/2022 1337   ALT 12 08/25/2022 1337   ALKPHOS 75 08/25/2022 1337   BILITOT 0.4 08/25/2022 1337   PROT 6.2 (L) 08/25/2022 1337   ALBUMIN 3.0 (L) 08/25/2022 1337     Present during today's visit: Patient and his wife   Assessment and Plan: Patient reported dizziness began last week, in the  afternoon after taking his capecitabine During questioning about capecitabine side effects, patient mentioned that he was having trouble with urination and increased his tamsulosin dose around the same time the dizziness started He reported taking tamsulosin 0.'4mg'$ , 2 tablets twice daily This is higher than the recommended ose and could be the cause of his dizziness Instructed patient to go back to taking the the tamsulosin as prescribed and call his Urology office to let them know about his increased issues with urination Also encouraged patient to increase his home hydration of water. Patient reports  drinking coffee, sweet tea, beer, and milk Continue capecitabine as prescribed, dizziness likely from tamsulosin high dose and/or dehydration No reported diarrhea, nausea, hand-foot syndrome, or mouth sores    Oral Chemotherapy Adherence: Patient did not take his evening dose of capecitabine on Friday because of the reported dizziness No patient barriers to medication adherence identified.   New medications: None reported  Medication Access Issues: None  Patient expressed understanding and was in agreement with this plan. He also understands that He can call clinic at any time with any questions, concerns, or complaints.   Follow-up plan: can be seen by CPP upon request   Thank you for allowing me to participate in the care of this very pleasant patient.   Time Total: 15 minutes  Visit consisted of counseling and education on dealing with issues of symptom management in the setting of serious and potentially life-threatening illness.Greater than 50%  of this time was spent counseling and coordinating care related to the above assessment and plan.  Signed by: Darl Pikes, PharmD, BCPS, Salley Slaughter, CPP Hematology/Oncology Clinical Pharmacist Practitioner [place]/DB/AP [facility] [phone]  08/25/2022 2:42 PM

## 2022-08-25 NOTE — Progress Notes (Addendum)
Symptom Management [facility] Telephone:([phone] [phone]) [phone]  Patient Care Team: Kyle David, MD as PCP - General (Cardiology) Kyle Jacks, RN as Oncology Nurse Navigator Kyle Larson, Kyle November, MD as Consulting Physician (Oncology)   NAME OF PATIENT: Kyle Larson  [id number]  1956-03-15   DATE OF VISIT: 08/25/22  REASON FOR CONSULT: Kyle Larson is a 66 y.o. male with multiple medical problems including adenocarcinoma of the rectum currently status post diverting colostomy followed by neoadjuvant FOLFOX chemotherapy now on concurrent XRT/capecitabine.  INTERVAL HISTORY: Patient presents [name] for evaluation of dizziness.  He has had intermittent dizziness with position changes.  No falls or syncopal events.  No shortness of breath or chest pain.  No fever or chills.  Does have intermittent nausea and reports reduced oral intake.  No diarrhea but still has some mucus in ostomy bag.  Denies any neurologic complaints. Denies recent fevers or illnesses. Denies any easy bleeding or bruising. Denies chest pain. Patient offers no further specific complaints today.   PAST MEDICAL HISTORY: Past Medical History:  Diagnosis Date   AC joint dislocation    CAD (coronary artery disease)    CVA (cerebral vascular accident) ([place])    Metatarsal bone fracture    3rd metatarsal , left foot; April 2021   Syncope    June '22   Tobacco abuse     PAST SURGICAL HISTORY:  Past Surgical History:  Procedure Laterality Date   ACROMIO-CLAVICULAR JOINT REPAIR Left 09/11/2016   Procedure: ACROMIO-CLAVICULAR RECONSTRUCTION;  Surgeon: Corky Mull, MD;  Location: [facility] ORS;  Service: Orthopedics;  Laterality: Left;  clavicle   FLEXIBLE SIGMOIDOSCOPY N/A 02/24/2022   Procedure: FLEXIBLE SIGMOIDOSCOPY;  Surgeon: Lesly Rubenstein, MD;  Location: [facility] ENDOSCOPY;  Service: Endoscopy;  Laterality: N/A;   IR IMAGING GUIDED PORT INSERTION   03/10/2022   LEFT HEART CATH AND CORONARY ANGIOGRAPHY N/A 05/20/2021   Procedure: LEFT HEART CATH AND CORONARY ANGIOGRAPHY with coronary intervention;  Surgeon: Kyle David, MD;  Location: [place] [facility];  Service: Cardiovascular;  Laterality: N/A;   MANDIBLE RECONSTRUCTION     TONSILLECTOMY     AGE 66   XI ROBOTIC ASSISTED INGUINAL HERNIA REPAIR WITH MESH Right 03/26/2020   Procedure: XI ROBOTIC ASSISTED INGUINAL HERNIA REPAIR WITH MESH;  Surgeon: Herbert Pun, MD;  Location: [facility] ORS;  Service: General;  Laterality: Right;    HEMATOLOGY/ONCOLOGY HISTORY:  Oncology History  Adenocarcinoma of rectum ([place])  02/25/2022 Initial Diagnosis   Adenocarcinoma of rectum ([place])   03/10/2022 Cancer Staging   Staging form: Colon and Rectum, AJCC 8th Edition - Clinical stage from 03/10/2022: Stage IIA (cT3, cN0, cM0) - Signed by Lloyd Huger, MD on 03/10/2022 Stage prefix: Initial diagnosis Total positive nodes: 0   03/12/2022 - 07/11/2022 Chemotherapy   Patient is on Treatment Plan : COLORECTAL FOLFOX q14d x 4 months       ALLERGIES:  has No Known Allergies.  MEDICATIONS:  Current Outpatient Medications  Medication Sig Dispense Refill   acetaminophen (TYLENOL) 325 MG tablet Take 2 tablets (650 mg total) by mouth every 6 (six) hours as needed for mild pain.     ALPRAZolam (XANAX) 0.25 MG tablet Take 1 tablet (0.25 mg total) by mouth at bedtime as needed for sleep. 30 tablet 0   atorvastatin (LIPITOR) 40 MG tablet Take 1 tablet (40 mg total) by mouth daily. 30 tablet 0   capecitabine (XELODA) 150 MG tablet Take 2  tablets (300 mg total) by mouth 2 (two) times daily after a meal. Take Monday-Friday. Take only on days of radiation. Take along with '500mg'$  tablets. 100 tablet 0   capecitabine (XELODA) 500 MG tablet Take 2 tablets (1,000 mg total) by mouth 2 (two) times daily after a meal. Take Monday-Friday. Take only on days of radiation. Take along with '150mg'$  tablets. 100 tablet 0    feeding supplement (ENSURE ENLIVE / ENSURE PLUS) LIQD Take 237 mLs by mouth 3 (three) times daily between meals. 21330 mL 0   fentaNYL (DURAGESIC) 50 MCG/HR Place 1 patch onto the skin every 3 (three) days. 10 patch 0   ferrous sulfate 325 (65 FE) MG tablet Take 1 tablet (325 mg total) by mouth daily. 30 tablet 0   folic acid (FOLVITE) 1 MG tablet Take 1 tablet (1 mg total) by mouth daily. 30 tablet 0   lidocaine-prilocaine (EMLA) cream Apply to affected area once 30 g 3   lidocaine-prilocaine (EMLA) cream Apply 1 application  topically as needed. Apply cream to port 1 hour prior to chemotherapy. Cover with plastic wrap. 30 g 3   Multiple Vitamins-Minerals (MULTIVITAMIN WITH MINERALS) tablet Take 1 tablet by mouth daily.     nicotine (NICODERM CQ - DOSED IN MG/24 HOURS) 21 mg/24hr patch Place 21 mg onto the skin daily. (Patient not taking: Reported on 07/16/2022)     ondansetron (ZOFRAN) 8 MG tablet TAKE 1 TABLET 2 TIMES DAILY AS NEEDED FOR REFRACTORY NAUSEA / VOMITING. START DAY 3 AFTER CHEMO 60 tablet 2   Oxycodone HCl 10 MG TABS Take 1 tablet (10 mg total) by mouth every 6 (six) hours as needed. Okay to take 1/2 tablet as needed. 60 tablet 0   pantoprazole (PROTONIX) 40 MG tablet Take 1 tablet (40 mg total) by mouth daily. 30 tablet 2   polyethylene glycol (MIRALAX / GLYCOLAX) 17 g packet Take 17 g by mouth daily.     prochlorperazine (COMPAZINE) 10 MG tablet Take 1 tablet (10 mg total) by mouth every 6 (six) hours as needed (Nausea or vomiting). (Patient not taking: Reported on 06/18/2022) 60 tablet 2   sildenafil (VIAGRA) 25 MG tablet Take 1 tablet (25 mg total) by mouth daily as needed for erectile dysfunction. 10 tablet 0   sotalol (BETAPACE) 80 MG tablet Take 80 mg by mouth daily.     tamsulosin (FLOMAX) 0.4 MG CAPS capsule Take 2 capsules (0.8 mg total) by mouth daily. 30 capsule 11   thiamine 100 MG tablet Take 1 tablet (100 mg total) by mouth daily. 30 tablet 0   No current  facility-administered medications for this visit.   Facility-Administered Medications Ordered in Other Visits  Medication Dose Route Frequency Provider Last Rate Last Admin   sodium chloride flush (NS) 0.9 % injection 10 mL  10 mL Intravenous Once Tamara Kenyon, Kirt Boys, NP        VITAL SIGNS: BP 114/74   Pulse 100   Temp (!) 97.3 F (36.3 C) (Tympanic)   Ht 6' (1.829 m)   Wt 119 lb (54 kg)   BMI 16.14 kg/m  Filed Weights   08/25/22 1317  Weight: 119 lb (54 kg)    Estimated body mass index is 16.14 kg/m as calculated from the following:   Height as of this encounter: 6' (1.829 m).   Weight as of this encounter: 119 lb (54 kg).  LABS: CBC:    Component Value Date/Time   WBC 6.3 08/25/2022 1337   HGB  10.5 (L) 08/25/2022 1337   HCT 30.3 (L) 08/25/2022 1337   PLT 168 08/25/2022 1337   MCV 101.3 (H) 08/25/2022 1337   NEUTROABS 4.0 08/25/2022 1337   LYMPHSABS 0.7 08/25/2022 1337   MONOABS 0.9 08/25/2022 1337   EOSABS 0.7 (H) 08/25/2022 1337   BASOSABS 0.0 08/25/2022 1337   Comprehensive Metabolic Panel:    Component Value Date/Time   NA 133 (L) 08/07/2022 1441   K 3.5 08/07/2022 1441   CL 102 08/07/2022 1441   CO2 25 08/07/2022 1441   BUN 7 (L) 08/07/2022 1441   CREATININE 0.32 (L) 08/07/2022 1441   GLUCOSE 104 (H) 08/07/2022 1441   CALCIUM 8.3 (L) 08/07/2022 1441   AST 27 08/07/2022 1441   ALT 13 08/07/2022 1441   ALKPHOS 74 08/07/2022 1441   BILITOT 0.7 08/07/2022 1441   PROT 6.3 (L) 08/07/2022 1441   ALBUMIN 2.9 (L) 08/07/2022 1441    RADIOGRAPHIC STUDIES: No results found.  PERFORMANCE STATUS (ECOG) : 1 - Symptomatic but completely ambulatory  Review of Systems Unless otherwise noted, a complete review of systems is negative.  Physical Exam General: NAD Cardiovascular: regular rate and rhythm Pulmonary: clear ant fields Abdomen: Ostomy noted but not visualized GU: no suprapubic tenderness Extremities: no edema, no joint deformities Skin: no  rashes Neurological: Weakness but otherwise nonfocal  IMPRESSION/PLAN: Dizziness -likely multifactorial with component of orthostasis/hydration.  We will proceed with gentle IV fluids/rehydration today.  Patient was also seen by Nuala Alpha and patient admitted to taking Flomax two 0.'4mg'$  tablets twice daily due to recent difficulty urinating.  I suspect that this is also contributing to his dizziness and patient was recommended to reduce dose back to 0.4 mg tablet once daily as prescribed by urology.  Recommend that he follow-up with urology for further guidance.  Patient may benefit from intermittent supportive care/fluids in clinic as he continues to receive XRT.  Neoplasm related pain -patient was previously on fentanyl patch 50 mcg every 72 hours but has not been able to afford that due to insurance issues and therefore has been off of it since June.  Patient has been taking oxycodone at times every 2-3 hours.  I explained that that was too frequent with administration and that he needed to strictly adhere to prescribed regimen.  Patient now has Medicaid and would like to try back on fentanyl patch as he reports that pain was better controlled.  We will refill fentanyl but dose decreased to 25 mcg given the amount of time he has been off of it.  Follow-up telephone visit 2 weeks  Patient expressed understanding and was in agreement with this plan. He also understands that He can call clinic at any time with any questions, concerns, or complaints.   Thank you for allowing me to participate in the care of this very pleasant patient.   Time Total: 20 minutes  Visit consisted of counseling and education dealing with the complex and emotionally intense issues of symptom management in the setting of serious illness.Greater than 50%  of this time was spent counseling and coordinating care related to the above assessment and plan.  Signed by: Altha Harm, PhD, NP-C

## 2022-08-26 ENCOUNTER — Encounter: Payer: Self-pay | Admitting: Urology

## 2022-08-26 ENCOUNTER — Ambulatory Visit
Admission: RE | Admit: 2022-08-26 | Discharge: 2022-08-26 | Disposition: A | Discharge status: Home / Self Care | Payer: Medicare Other | Source: Ambulatory Visit | Attending: Radiation Oncology | Admitting: Radiation Oncology

## 2022-08-26 ENCOUNTER — Other Ambulatory Visit: Payer: Self-pay

## 2022-08-26 DIAGNOSIS — C20 Malignant neoplasm of rectum: Secondary | ICD-10-CM | POA: Diagnosis not present

## 2022-08-26 DIAGNOSIS — Z510 Encounter for antineoplastic radiation therapy: Secondary | ICD-10-CM | POA: Diagnosis not present

## 2022-08-26 LAB — RAD ONC ARIA SESSION SUMMARY
Course Elapsed Days: 26
Plan Fractions Treated to Date: 19
Plan Prescribed Dose Per Fraction: 1.8 Gy
Plan Total Fractions Prescribed: 25
Plan Total Prescribed Dose: 45 Gy
Reference Point Dosage Given to Date: 34.2 Gy
Reference Point Session Dosage Given: 1.8 Gy
Session Number: 19

## 2022-08-27 ENCOUNTER — Ambulatory Visit
Admission: RE | Admit: 2022-08-27 | Discharge: 2022-08-27 | Disposition: A | Discharge status: Home / Self Care | Payer: Medicare Other | Source: Ambulatory Visit | Attending: Radiation Oncology | Admitting: Radiation Oncology

## 2022-08-27 ENCOUNTER — Other Ambulatory Visit: Payer: Self-pay

## 2022-08-27 DIAGNOSIS — Z510 Encounter for antineoplastic radiation therapy: Secondary | ICD-10-CM | POA: Diagnosis not present

## 2022-08-27 DIAGNOSIS — C20 Malignant neoplasm of rectum: Secondary | ICD-10-CM | POA: Diagnosis not present

## 2022-08-27 LAB — RAD ONC ARIA SESSION SUMMARY
Course Elapsed Days: 27
Plan Fractions Treated to Date: 20
Plan Prescribed Dose Per Fraction: 1.8 Gy
Plan Total Fractions Prescribed: 25
Plan Total Prescribed Dose: 45 Gy
Reference Point Dosage Given to Date: 36 Gy
Reference Point Session Dosage Given: 1.8 Gy
Session Number: 20

## 2022-08-28 ENCOUNTER — Other Ambulatory Visit: Payer: Self-pay

## 2022-08-28 ENCOUNTER — Other Ambulatory Visit (HOSPITAL_COMMUNITY): Payer: Self-pay

## 2022-08-28 ENCOUNTER — Ambulatory Visit
Admission: RE | Admit: 2022-08-28 | Discharge: 2022-08-28 | Disposition: A | Discharge status: Home / Self Care | Payer: Medicare Other | Source: Ambulatory Visit | Attending: Radiation Oncology | Admitting: Radiation Oncology

## 2022-08-28 DIAGNOSIS — C20 Malignant neoplasm of rectum: Secondary | ICD-10-CM | POA: Diagnosis not present

## 2022-08-28 DIAGNOSIS — Z510 Encounter for antineoplastic radiation therapy: Secondary | ICD-10-CM | POA: Diagnosis not present

## 2022-08-28 LAB — RAD ONC ARIA SESSION SUMMARY
Course Elapsed Days: 28
Plan Fractions Treated to Date: 21
Plan Prescribed Dose Per Fraction: 1.8 Gy
Plan Total Fractions Prescribed: 25
Plan Total Prescribed Dose: 45 Gy
Reference Point Dosage Given to Date: 37.8 Gy
Reference Point Session Dosage Given: 1.8 Gy
Session Number: 21

## 2022-08-29 ENCOUNTER — Other Ambulatory Visit: Payer: Self-pay

## 2022-08-29 ENCOUNTER — Ambulatory Visit
Admission: RE | Admit: 2022-08-29 | Discharge: 2022-08-29 | Disposition: A | Discharge status: Home / Self Care | Payer: Medicare Other | Source: Ambulatory Visit | Attending: Radiation Oncology | Admitting: Radiation Oncology

## 2022-08-29 DIAGNOSIS — Z510 Encounter for antineoplastic radiation therapy: Secondary | ICD-10-CM | POA: Diagnosis not present

## 2022-08-29 DIAGNOSIS — C20 Malignant neoplasm of rectum: Secondary | ICD-10-CM | POA: Diagnosis not present

## 2022-08-29 LAB — RAD ONC ARIA SESSION SUMMARY
Course Elapsed Days: 29
Plan Fractions Treated to Date: 22
Plan Prescribed Dose Per Fraction: 1.8 Gy
Plan Total Fractions Prescribed: 25
Plan Total Prescribed Dose: 45 Gy
Reference Point Dosage Given to Date: 39.6 Gy
Reference Point Session Dosage Given: 1.8 Gy
Session Number: 22

## 2022-09-01 ENCOUNTER — Ambulatory Visit
Admission: RE | Admit: 2022-09-01 | Discharge: 2022-09-01 | Disposition: A | Discharge status: Home / Self Care | Payer: Medicare Other | Source: Ambulatory Visit | Attending: Radiation Oncology | Admitting: Radiation Oncology

## 2022-09-01 ENCOUNTER — Other Ambulatory Visit: Payer: Self-pay

## 2022-09-01 DIAGNOSIS — C20 Malignant neoplasm of rectum: Secondary | ICD-10-CM | POA: Diagnosis not present

## 2022-09-01 DIAGNOSIS — Z510 Encounter for antineoplastic radiation therapy: Secondary | ICD-10-CM | POA: Diagnosis not present

## 2022-09-01 LAB — RAD ONC ARIA SESSION SUMMARY
Course Elapsed Days: 32
Plan Fractions Treated to Date: 23
Plan Prescribed Dose Per Fraction: 1.8 Gy
Plan Total Fractions Prescribed: 25
Plan Total Prescribed Dose: 45 Gy
Reference Point Dosage Given to Date: 41.4 Gy
Reference Point Session Dosage Given: 1.8 Gy
Session Number: 23

## 2022-09-02 ENCOUNTER — Encounter: Payer: Self-pay | Admitting: Urology

## 2022-09-02 ENCOUNTER — Ambulatory Visit (INDEPENDENT_AMBULATORY_CARE_PROVIDER_SITE_OTHER): Payer: Medicare Other | Admitting: Urology

## 2022-09-02 ENCOUNTER — Ambulatory Visit
Admission: RE | Admit: 2022-09-02 | Discharge: 2022-09-02 | Disposition: A | Discharge status: Home / Self Care | Payer: Medicare Other | Source: Ambulatory Visit | Attending: Radiation Oncology | Admitting: Radiation Oncology

## 2022-09-02 ENCOUNTER — Other Ambulatory Visit: Payer: Self-pay

## 2022-09-02 VITALS — BP 120/76 | HR 93 | Wt 119.0 lb

## 2022-09-02 DIAGNOSIS — C20 Malignant neoplasm of rectum: Secondary | ICD-10-CM | POA: Diagnosis not present

## 2022-09-02 DIAGNOSIS — R3914 Feeling of incomplete bladder emptying: Secondary | ICD-10-CM

## 2022-09-02 DIAGNOSIS — R339 Retention of urine, unspecified: Secondary | ICD-10-CM

## 2022-09-02 DIAGNOSIS — R3989 Other symptoms and signs involving the genitourinary system: Secondary | ICD-10-CM

## 2022-09-02 DIAGNOSIS — Z510 Encounter for antineoplastic radiation therapy: Secondary | ICD-10-CM | POA: Diagnosis not present

## 2022-09-02 DIAGNOSIS — R39198 Other difficulties with micturition: Secondary | ICD-10-CM | POA: Diagnosis not present

## 2022-09-02 DIAGNOSIS — R972 Elevated prostate specific antigen [PSA]: Secondary | ICD-10-CM | POA: Diagnosis not present

## 2022-09-02 LAB — RAD ONC ARIA SESSION SUMMARY
Course Elapsed Days: 33
Plan Fractions Treated to Date: 24
Plan Prescribed Dose Per Fraction: 1.8 Gy
Plan Total Fractions Prescribed: 25
Plan Total Prescribed Dose: 45 Gy
Reference Point Dosage Given to Date: 43.2 Gy
Reference Point Session Dosage Given: 1.8 Gy
Session Number: 24

## 2022-09-02 LAB — BLADDER SCAN AMB NON-IMAGING: Scan Result: 173

## 2022-09-02 MED ORDER — CEFTRIAXONE SODIUM 1 G IJ SOLR
1.0000 g | Freq: Once | INTRAMUSCULAR | Status: AC
  Administered 2022-09-02: 1 g via INTRAMUSCULAR

## 2022-09-02 MED ORDER — SULFAMETHOXAZOLE-TRIMETHOPRIM 800-160 MG PO TABS: 1.0000 | ORAL_TABLET | Freq: Two times a day (BID) | ORAL | 0 refills | Status: DC

## 2022-09-02 NOTE — Progress Notes (Signed)
09/02/2022 9:28 PM   Kyle Larson June 12, 1956 [id number]  Referring provider: [name], MD [address] [place],  [place] [address]  Urological history: 1. Elevated PSA -Father with prostate cancer -PSA (12/18/2021) 5.5  Free PSA 0.53  % free PSA 9.6% - noted a 55% probability of having a positive prostate cancer at the time of biopsy -work up deferred secondary to rectal cancer -follow up w/ Dr. Diamantina Providence 11/11/2022  2. Urinary retention -Developed urinary retention September 2022 and had Foley placed subsequently removed it himself at home did not follow-up -173 mL      Chief Complaint  Patient presents with   Urinary Retention    HPI: Kyle Larson is a 66 y.o. male who presents today at the front desk stating he cannot pee w/ his wife, Cassie.   He states that for the last two weeks it has been killing him to pee.  He is having a hard time passing his urine at night.  Patient denies any modifying or aggravating factors.  Patient denies any gross hematuria or suprapubic/flank pain.  Patient denies any fevers, chills, nausea or vomiting.    UA nitrite positive, > 30 WBC's and many bacteria.   PVR 173 mL   PMH: Past Medical History:  Diagnosis Date   AC joint dislocation    CAD (coronary artery disease)    CVA (cerebral vascular accident) ([place])    Metatarsal bone fracture    3rd metatarsal , left foot; April 2021   Syncope    June '22   Tobacco abuse     Surgical History: Past Surgical History:  Procedure Laterality Date   ACROMIO-CLAVICULAR JOINT REPAIR Left 09/11/2016   Procedure: ACROMIO-CLAVICULAR RECONSTRUCTION;  Surgeon: Corky Mull, MD;  Location: [facility];  Service: Orthopedics;  Laterality: Left;  clavicle   FLEXIBLE SIGMOIDOSCOPY N/A 02/24/2022   Procedure: FLEXIBLE SIGMOIDOSCOPY;  Surgeon: Lesly Rubenstein, MD;  Location: [facility];  Service: Endoscopy;  Laterality: N/A;   IR IMAGING GUIDED PORT INSERTION  03/10/2022   LEFT  HEART CATH AND CORONARY ANGIOGRAPHY N/A 05/20/2021   Procedure: LEFT HEART CATH AND CORONARY ANGIOGRAPHY with coronary intervention;  Surgeon: [name], MD;  Location: [facility];  Service: Cardiovascular;  Laterality: N/A;   MANDIBLE RECONSTRUCTION     TONSILLECTOMY     AGE 23   XI ROBOTIC ASSISTED INGUINAL HERNIA REPAIR WITH MESH Right 03/26/2020   Procedure: XI ROBOTIC ASSISTED INGUINAL HERNIA REPAIR WITH MESH;  Surgeon: Herbert Pun, MD;  Location: [facility];  Service: General;  Laterality: Right;    Home Medications:  Allergies as of 09/02/2022   No Known Allergies      Medication List        Accurate as of September 02, 2022  9:28 PM. If you have any questions, ask your nurse or doctor.          acetaminophen 325 MG tablet Commonly known as: Tylenol Take 2 tablets (650 mg total) by mouth every 6 (six) hours as needed for mild pain.   ALPRAZolam 0.25 MG tablet Commonly known as: XANAX Take 1 tablet (0.25 mg total) by mouth at bedtime as needed for sleep.   atorvastatin 40 MG tablet Commonly known as: LIPITOR Take 1 tablet (40 mg total) by mouth daily.   capecitabine 500 MG tablet Commonly known as: Xeloda Take 2 tablets (1,000 mg total) by mouth 2 (two) times daily after a meal. Take Monday-Friday. Take only on days of  radiation. Take along with '150mg'$  tablets.   capecitabine 150 MG tablet Commonly known as: XELODA Take 2 tablets (300 mg total) by mouth 2 (two) times daily after a meal. Take Monday-Friday. Take only on days of radiation. Take along with '500mg'$  tablets.   feeding supplement Liqd Take 237 mLs by mouth 3 (three) times daily between meals.   fentaNYL 25 MCG/HR Commonly known as: [place] 1 patch onto the skin every 3 (three) days.   ferrous sulfate 325 (65 FE) MG tablet Take 1 tablet (325 mg total) by mouth daily.   folic acid 1 MG tablet Commonly known as: FOLVITE Take 1 tablet (1 mg total) by mouth daily.    lidocaine-prilocaine cream Commonly known as: EMLA Apply to affected area once   lidocaine-prilocaine cream Commonly known as: EMLA Apply 1 application  topically as needed. Apply cream to port 1 hour prior to chemotherapy. Cover with plastic wrap.   multivitamin with minerals tablet Take 1 tablet by mouth daily.   nicotine 21 mg/24hr patch Commonly known as: NICODERM CQ - dosed in mg/24 hours Place 21 mg onto the skin daily.   ondansetron 8 MG tablet Commonly known as: ZOFRAN TAKE 1 TABLET 2 TIMES DAILY AS NEEDED FOR REFRACTORY NAUSEA / VOMITING. START DAY 3 AFTER CHEMO   Oxycodone HCl 10 MG Tabs Take 1 tablet (10 mg total) by mouth every 6 (six) hours as needed. Okay to take 1/2 tablet as needed.   pantoprazole 40 MG tablet Commonly known as: Protonix Take 1 tablet (40 mg total) by mouth daily.   polyethylene glycol 17 g packet Commonly known as: MIRALAX / GLYCOLAX Take 17 g by mouth daily.   prochlorperazine 10 MG tablet Commonly known as: COMPAZINE Take 1 tablet (10 mg total) by mouth every 6 (six) hours as needed (Nausea or vomiting).   sildenafil 25 MG tablet Commonly known as: Viagra Take 1 tablet (25 mg total) by mouth daily as needed for erectile dysfunction.   sotalol 80 MG tablet Commonly known as: BETAPACE Take 80 mg by mouth daily.   sulfamethoxazole-trimethoprim 800-160 MG tablet Commonly known as: BACTRIM DS Take 1 tablet by mouth every 12 (twelve) hours. Start this antibiotic tomorrow Started by: Zara Council, PA-C   tamsulosin 0.4 MG Caps capsule Commonly known as: FLOMAX Take 2 capsules (0.8 mg total) by mouth daily.   thiamine 100 MG tablet Commonly known as: VITAMIN B1 Take 1 tablet (100 mg total) by mouth daily.        Allergies: No Known Allergies  Family History: Family History  Problem Relation Age of Onset   Diabetes Mother    Prostate cancer Father     Social History:  reports that he has been smoking cigarettes. He  has a 3.75 pack-year smoking history. He has been exposed to tobacco smoke. He has never used smokeless tobacco. He reports current alcohol use of about 42.0 standard drinks of alcohol per week. He reports that he does not use drugs.  ROS: Pertinent ROS in HPI  Physical Exam: BP 120/76   Pulse 93   Wt 119 lb (54 kg)   BMI 16.14 kg/m   Constitutional:  Well nourished. Alert and oriented, No acute distress. HEENT: [place] AT, moist mucus membranes.  Trachea midline Cardiovascular: No clubbing, cyanosis, or edema. Respiratory: Normal respiratory effort, no increased work of breathing. Neurologic: Grossly intact, no focal deficits, moving all 4 extremities. Psychiatric: Normal mood and affect.  Laboratory Data: Lab Results  Component Value Date  WBC 6.3 08/25/2022   HGB 10.5 (L) 08/25/2022   HCT 30.3 (L) 08/25/2022   MCV 101.3 (H) 08/25/2022   PLT 168 08/25/2022    Lab Results  Component Value Date   CREATININE 0.38 (L) 08/25/2022    Lab Results  Component Value Date   HGBA1C 5.4 01/06/2022    Lab Results  Component Value Date   TSH 3.919 05/20/2021       Component Value Date/Time   CHOL 151 01/06/2022 1306   HDL 74 01/06/2022 1306   CHOLHDL 2.0 01/06/2022 1306   VLDL 7 01/06/2022 1306   LDLCALC 70 01/06/2022 1306    Lab Results  Component Value Date   AST 24 08/25/2022   Lab Results  Component Value Date   ALT 12 08/25/2022    Urinalysis See Epic and HPI I have reviewed the labs.   Pertinent Imaging:  09/02/22 16:01  Scan Result 173 (C)  (C): Corrected   Assessment & Plan:    1. Suspected UTI -UA grossly infected -urine culture -given Rocephin 1 gram IM in office -start Septra DS, twice daily for seven days   2. Feeling of incomplete bladder emptying -PVR demonstrated adequate bladder emptying today in the office -Symptoms likely due to infection -Historically has not tolerated Foley catheter as well in the past, so he is in agreement with  not having one placed today -Reviewed the symptoms of pending retention and if he should experience these to seek treatment in the ED or our office or if he finds himself completely unable to pass his urine to seek treatment in the ED or our office  3. Elevated PSA -has appointment w/ Dr. Diamantina Providence in 10/2022   Return for pending urine culture results .  These notes generated with voice recognition software. I apologize for typographical errors.  [place], [place] [address]  [place], [place] [address] [phone]

## 2022-09-02 NOTE — Progress Notes (Signed)
IM Injection  Patient is present today for an IM Injection for treatment of UTI Drug: Ceftriaxone Dose:1g Location:Left upper outer quadrant Lot: [id number] Exp:03/2024 Patient tolerated well, no complications were noted  Performed by: Bradly Bienenstock CMA

## 2022-09-03 ENCOUNTER — Ambulatory Visit
Admission: RE | Admit: 2022-09-03 | Discharge: 2022-09-03 | Disposition: A | Discharge status: Home / Self Care | Payer: Medicare Other | Source: Ambulatory Visit | Attending: Radiation Oncology | Admitting: Radiation Oncology

## 2022-09-03 ENCOUNTER — Other Ambulatory Visit: Payer: Self-pay

## 2022-09-03 DIAGNOSIS — C20 Malignant neoplasm of rectum: Secondary | ICD-10-CM | POA: Diagnosis not present

## 2022-09-03 DIAGNOSIS — Z510 Encounter for antineoplastic radiation therapy: Secondary | ICD-10-CM | POA: Diagnosis not present

## 2022-09-03 LAB — MICROSCOPIC EXAMINATION: WBC, UA: >30 /hpf — AB (ref 0–5)

## 2022-09-03 LAB — RAD ONC ARIA SESSION SUMMARY
Course Elapsed Days: 34
Plan Fractions Treated to Date: 25
Plan Prescribed Dose Per Fraction: 1.8 Gy
Plan Total Fractions Prescribed: 25
Plan Total Prescribed Dose: 45 Gy
Reference Point Dosage Given to Date: 45 Gy
Reference Point Session Dosage Given: 1.8 Gy
Session Number: 25

## 2022-09-03 LAB — URINALYSIS, COMPLETE
Bilirubin, UA: NEGATIVE
Glucose, UA: NEGATIVE
Ketones, UA: NEGATIVE
Nitrite, UA: POSITIVE — AB
Protein,UA: NEGATIVE
RBC, UA: NEGATIVE
Specific Gravity, UA: 1.01 (ref 1.005–1.030)
Urobilinogen, Ur: 0.2 mg/dL (ref 0.2–1.0)
pH, UA: 7 (ref 5.0–7.5)

## 2022-09-04 LAB — CULTURE, URINE COMPREHENSIVE

## 2022-09-05 ENCOUNTER — Other Ambulatory Visit: Payer: Self-pay | Admitting: *Deleted

## 2022-09-05 ENCOUNTER — Telehealth: Payer: Self-pay

## 2022-09-05 MED ORDER — OXYCODONE HCL 10 MG PO TABS: 10.0000 mg | ORAL_TABLET | Freq: Four times a day (QID) | ORAL | 0 refills | Status: DC | PRN

## 2022-09-05 NOTE — Telephone Encounter (Signed)
Notified pt as advised. Pt states he is feeling better but is still experiencing some of the burning symptoms. Pt states it has gotten better but is not completely gone. He has been taking his oral Abx as prescribed and is not finished with those yet. Advised pt that if its not better by Tuesday to call the office and make an appt. Pt verbalized understanding.

## 2022-09-05 NOTE — Telephone Encounter (Signed)
-----   Message from Nori Riis, PA-C sent at 09/04/2022  5:07 PM EDT ----- Please let Kyle Larson know that his urine culture was positive for infection and that the injection we gave him and the antibiotic that he is taking by mouth, Septra, or the appropriate antibiotics and hopefully he is feeling better.  If he feels he is not better or his symptoms have worsened, please make him an appointment.  Otherwise we will see him in December for his appointment with Dr. Diamantina Providence.

## 2022-09-08 ENCOUNTER — Telehealth: Payer: Self-pay

## 2022-09-08 ENCOUNTER — Inpatient Hospital Stay (HOSPITAL_BASED_OUTPATIENT_CLINIC_OR_DEPARTMENT_OTHER): Payer: Medicare Other | Admitting: Hospice and Palliative Medicine

## 2022-09-08 DIAGNOSIS — G893 Neoplasm related pain (acute) (chronic): Secondary | ICD-10-CM | POA: Diagnosis not present

## 2022-09-08 DIAGNOSIS — C20 Malignant neoplasm of rectum: Secondary | ICD-10-CM

## 2022-09-08 MED ORDER — ALPRAZOLAM 0.25 MG PO TABS: 0.2500 mg | ORAL_TABLET | Freq: Every evening | ORAL | 0 refills | Status: DC | PRN

## 2022-09-08 NOTE — Progress Notes (Signed)
Virtual Visit via Telephone Note  I connected with Kyle Larson on 09/08/22 at 10:30 AM EDT by telephone and verified that I am speaking with the correct person using two identifiers.  Location: Patient: Home Provider: Clinic   I discussed the limitations, risks, security and privacy concerns of performing an evaluation and management service by telephone and the availability of in person appointments. I also discussed with the patient that there may be a patient responsible charge related to this service. The patient expressed understanding and agreed to proceed.   History of Present Illness: Ron Beske is a 66 y.o. 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   Observations/Objective: Since last seen, patient had follow-up with urology and was started on Bactrim for possible UTI.  Patient endorses dysuria but states that that has improved some on antibiotics.  He also continues to endorse persistent rectal pain.  He has been taking oxycodone and says that he has used 5 tablets in the past 48 hours.  It is helping the pain but pain remains persistent at night.  Patient says that he is using alprazolam at night to help him sleep.  He request refill of alprazolam.  Of note, patient says that he never picked up the fentanyl prescription from the pharmacy.  Given early stage of cancer, we would hope to see gradual improvement in overall symptoms.  If pain becomes chronic, discussed that we would eventually need to refer him to a pain clinic for ongoing management.  Assessment and Plan: Neoplasm related pain -continue oxycodone.  Daily bowel regimen  Anxiety -refill alprazolam  Follow Up Instructions: Telephone visit 1-2 months   I discussed the assessment and treatment plan with the patient. The patient was provided an opportunity to  ask questions and all were answered. The patient agreed with the plan and demonstrated an understanding of the instructions.   The patient was advised to call back or seek an in-person evaluation if the symptoms worsen or if the condition fails to improve as anticipated.  I provided 12 minutes of non-face-to-face time during this encounter.   Irean Hong, NP

## 2022-09-08 NOTE — Telephone Encounter (Signed)
Spoke with Mr. Wander regarding preference for surgery. He prefers [place]. Referral sent to Dr. Dema Severin at [place]. Will follow up regarding scheduling.

## 2022-09-10 ENCOUNTER — Other Ambulatory Visit (HOSPITAL_COMMUNITY): Payer: Self-pay

## 2022-09-15 ENCOUNTER — Other Ambulatory Visit (HOSPITAL_COMMUNITY): Payer: Self-pay

## 2022-09-17 ENCOUNTER — Other Ambulatory Visit (HOSPITAL_COMMUNITY): Payer: Self-pay

## 2022-09-19 ENCOUNTER — Other Ambulatory Visit: Payer: Self-pay | Admitting: *Deleted

## 2022-09-19 MED ORDER — OXYCODONE HCL 10 MG PO TABS: 10.0000 mg | ORAL_TABLET | Freq: Four times a day (QID) | ORAL | 0 refills | Status: DC | PRN

## 2022-09-19 NOTE — Telephone Encounter (Addendum)
Patient called reporting that he is having difficulty urinating and that is causing increased abdominal cramping making him need his pain medicine. He is requesting a refill of his Oxycodone to be sent to [facility] in [place]. He also reports that he is having his surgery on Tuesday which should help with all of this.  He states he has been using his Fentanyl patches down on his low back where he is having pain from his hemorrhoids. I asked him again what he said to clarify and he said he thought if he put it where he is hurting it would help more. I explained to him how Fentanyl works and that it is not like the numbing patches and told him that he can put Fentanyl on his arms, chest ot upper back as well on an area where there is a little fat. He stated he didi not know that and thanked me for letting him know I explained that fentanyl absorbs through the skin and goes into his system to give pain relief.

## 2022-09-23 ENCOUNTER — Other Ambulatory Visit: Payer: Self-pay | Admitting: Surgery

## 2022-09-23 DIAGNOSIS — Z720 Tobacco use: Secondary | ICD-10-CM | POA: Diagnosis not present

## 2022-09-23 DIAGNOSIS — C20 Malignant neoplasm of rectum: Secondary | ICD-10-CM | POA: Diagnosis not present

## 2022-09-23 DIAGNOSIS — Z8673 Personal history of transient ischemic attack (TIA), and cerebral infarction without residual deficits: Secondary | ICD-10-CM | POA: Diagnosis not present

## 2022-09-25 ENCOUNTER — Ambulatory Visit (INDEPENDENT_AMBULATORY_CARE_PROVIDER_SITE_OTHER): Payer: Medicare Other | Admitting: Physician Assistant

## 2022-09-25 ENCOUNTER — Encounter: Payer: Self-pay | Admitting: Physician Assistant

## 2022-09-25 VITALS — BP 146/82 | HR 103 | Ht 72.0 in | Wt 127.4 lb

## 2022-09-25 DIAGNOSIS — R82998 Other abnormal findings in urine: Secondary | ICD-10-CM | POA: Diagnosis not present

## 2022-09-25 DIAGNOSIS — N390 Urinary tract infection, site not specified: Secondary | ICD-10-CM | POA: Diagnosis not present

## 2022-09-25 DIAGNOSIS — R3916 Straining to void: Secondary | ICD-10-CM | POA: Diagnosis not present

## 2022-09-25 DIAGNOSIS — R339 Retention of urine, unspecified: Secondary | ICD-10-CM | POA: Diagnosis not present

## 2022-09-25 DIAGNOSIS — Z87440 Personal history of urinary (tract) infections: Secondary | ICD-10-CM

## 2022-09-25 LAB — URINALYSIS, COMPLETE
Bilirubin, UA: NEGATIVE
Glucose, UA: NEGATIVE
Ketones, UA: NEGATIVE
Nitrite, UA: NEGATIVE
Protein,UA: NEGATIVE
Specific Gravity, UA: 1.015 (ref 1.005–1.030)
Urobilinogen, Ur: 0.2 mg/dL (ref 0.2–1.0)
pH, UA: 7.5 (ref 5.0–7.5)

## 2022-09-25 LAB — MICROSCOPIC EXAMINATION: WBC, UA: >30 /hpf — AB (ref 0–5)

## 2022-09-25 LAB — BLADDER SCAN AMB NON-IMAGING

## 2022-09-25 MED ORDER — TAMSULOSIN HCL 0.4 MG PO CAPS: 0.8000 mg | ORAL_CAPSULE | Freq: Every day | ORAL | 11 refills | Status: DC

## 2022-09-25 NOTE — Progress Notes (Signed)
09/25/2022 4:26 PM   Macklin Verne Grain Apr 25, 1956 [id number]  CC: Chief Complaint  Patient presents with   unable to void   HPI: Kyle Larson is a 66 y.o. male with rectal cancer with obstructive urinary symptoms and a recent history of urinary retention who passed a voiding trial in June 2023 who presents today for evaluation of possible urinary retention.   Today he reports 2 weeks of worsening obstructive urinary symptoms.  He describes painful straining to void but denies dysuria.  He is taking 2 Flomax in the morning and notes that this helps him, but then overnight he will have more difficulty urinating.  He denies fecaluria and pneumaturia.  He saw Zara Council in clinic last month for the same.  PVR 173 mL and his UA was grossly infected.  He was treated with Rocephin and Bactrim and his urine culture finalized with ampicillin resistant Klebsiella pneumoniae.  He saw Dr. Johney Maine 2 days ago, who is planning for rectosigmoid resection of his rectal tumor.  He has ordered an MR pelvis with and without contrast and CT chest abdomen and pelvis with contrast, which are both scheduled for 1 week from today.  In-office UA today positive for trace intact blood and 3+ leukocytes; urine microscopy with >30 WBCs/HPF, 3-10 RBCs/HPF, and many bacteria. PVR 142m.  PMH: Past Medical History:  Diagnosis Date   AC joint dislocation    CAD (coronary artery disease)    [place]    Metatarsal bone fracture    3rd metatarsal , left foot; April 2021   Syncope    June '22   Tobacco abuse     Surgical History: Past Surgical History:  Procedure Laterality Date   ACROMIO-CLAVICULAR JOINT REPAIR Left 09/11/2016   Procedure: ACROMIO-CLAVICULAR RECONSTRUCTION;  Surgeon: JCorky Mull MD;  Location: [facility];  Service: Orthopedics;  Laterality: Left;  clavicle   FLEXIBLE SIGMOIDOSCOPY N/A 02/24/2022   Procedure: FLEXIBLE SIGMOIDOSCOPY;  Surgeon: LLesly Rubenstein MD;  Location: [facility];  Service: Endoscopy;  Laterality: N/A;   IR IMAGING GUIDED PORT INSERTION  03/10/2022   LEFT HEART CATH AND CORONARY ANGIOGRAPHY N/A 05/20/2021   Procedure: LEFT HEART CATH AND CORONARY ANGIOGRAPHY with coronary intervention;  Surgeon: KDionisio David MD;  [facility];  Service: Cardiovascular;  Laterality: N/A;   MANDIBLE RECONSTRUCTION     TONSILLECTOMY     AGE 58   XI ROBOTIC ASSISTED INGUINAL HERNIA REPAIR WITH MESH Right 03/26/2020   Procedure: XI ROBOTIC ASSISTED INGUINAL HERNIA REPAIR WITH MESH;  Surgeon: CHerbert Pun MD;  Location: [facility];  Service: General;  Laterality: Right;    Home Medications:  Allergies as of 09/25/2022   No Known Allergies      Medication List        Accurate as of September 25, 2022  4:26 PM. If you have any questions, ask your nurse or doctor.          STOP taking these medications    sulfamethoxazole-trimethoprim 800-160 MG tablet Commonly known as: BACTRIM DS Stopped by: SDebroah Loop PA-C       TAKE these medications    acetaminophen 325 MG tablet Commonly known as: Tylenol Take 2 tablets (650 mg total) by mouth every 6 (six) hours as needed for mild pain.   ALPRAZolam 0.25 MG tablet Commonly known as: XANAX Take 1 tablet (0.25 mg total) by mouth at bedtime as needed for sleep.   atorvastatin 40 MG tablet  Commonly known as: LIPITOR Take 1 tablet (40 mg total) by mouth daily.   capecitabine 500 MG tablet Commonly known as: Xeloda Take 2 tablets (1,000 mg total) by mouth 2 (two) times daily after a meal. Take Monday-Friday. Take only on days of radiation. Take along with '150mg'$  tablets.   capecitabine 150 MG tablet Commonly known as: XELODA Take 2 tablets (300 mg total) by mouth 2 (two) times daily after a meal. Take Monday-Friday. Take only on days of radiation. Take along with '500mg'$  tablets.   feeding supplement Liqd Take 237 mLs by mouth 3 (three) times  daily between meals.   fentaNYL 25 MCG/HR Commonly known as: [place] 1 patch onto the skin every 3 (three) days.   ferrous sulfate 325 (65 FE) MG tablet Take 1 tablet (325 mg total) by mouth daily.   folic acid 1 MG tablet Commonly known as: FOLVITE Take 1 tablet (1 mg total) by mouth daily.   lidocaine-prilocaine cream Commonly known as: EMLA Apply to affected area once   lidocaine-prilocaine cream Commonly known as: EMLA Apply 1 application  topically as needed. Apply cream to port 1 hour prior to chemotherapy. Cover with plastic wrap.   multivitamin with minerals tablet Take 1 tablet by mouth daily.   nicotine 21 mg/24hr patch Commonly known as: NICODERM CQ - dosed in mg/24 hours Place 21 mg onto the skin daily.   ondansetron 8 MG tablet Commonly known as: ZOFRAN TAKE 1 TABLET 2 TIMES DAILY AS NEEDED FOR REFRACTORY NAUSEA / VOMITING. START DAY 3 AFTER CHEMO   Oxycodone HCl 10 MG Tabs Take 1 tablet (10 mg total) by mouth every 6 (six) hours as needed. Okay to take 1/2 tablet as needed.   pantoprazole 40 MG tablet Commonly known as: Protonix Take 1 tablet (40 mg total) by mouth daily.   polyethylene glycol 17 g packet Commonly known as: MIRALAX / GLYCOLAX Take 17 g by mouth daily.   prochlorperazine 10 MG tablet Commonly known as: COMPAZINE Take 1 tablet (10 mg total) by mouth every 6 (six) hours as needed (Nausea or vomiting).   sildenafil 25 MG tablet Commonly known as: Viagra Take 1 tablet (25 mg total) by mouth daily as needed for erectile dysfunction.   sotalol 80 MG tablet Commonly known as: BETAPACE Take 80 mg by mouth daily.   tamsulosin 0.4 MG Caps capsule Commonly known as: FLOMAX Take 2 capsules (0.8 mg total) by mouth daily. Take one in the morning and one at night. What changed: additional instructions Changed by: Debroah Loop, PA-C   thiamine 100 MG tablet Commonly known as: VITAMIN B1 Take 1 tablet (100 mg total) by mouth  daily.        Allergies:  No Known Allergies  Family History: Family History  Problem Relation Age of Onset   Diabetes Mother    Prostate cancer Father     Social History:   reports that he has been smoking cigarettes. He has a 3.75 pack-year smoking history. He has been exposed to tobacco smoke. He has never used smokeless tobacco. He reports current alcohol use of about 42.0 standard drinks of alcohol per week. He reports that he does not use drugs.  Physical Exam: BP (!) 146/82   Pulse (!) 103   Ht 6' (1.829 m)   Wt 127 lb 6.4 oz (57.8 kg)   BMI 17.28 kg/m   Constitutional:  Alert and oriented, no acute distress, nontoxic appearing HEENT: [place], AT Cardiovascular: No clubbing, cyanosis, or  edema Respiratory: Normal respiratory effort, no increased work of breathing Skin: No rashes, bruises or suspicious lesions Neurologic: Grossly intact, no focal deficits, moving all 4 extremities Psychiatric: Normal mood and affect  Laboratory Data: Results for orders placed or performed in visit on 09/25/22  CULTURE, URINE COMPREHENSIVE   Specimen: Urine   UR  Result Value Ref Range   Urine Culture, Comprehensive Final report (A)    Organism ID, Bacteria Klebsiella pneumoniae (A)    ANTIMICROBIAL SUSCEPTIBILITY Comment   Microscopic Examination   Urine  Result Value Ref Range   WBC, UA >30 (A) 0 - 5 /hpf   RBC, Urine 3-10 (A) 0 - 2 /hpf   Epithelial Cells (non renal) 0-10 0 - 10 /hpf   Bacteria, UA Many (A) None seen/Few  Urinalysis, Complete  Result Value Ref Range   Specific Gravity, UA 1.015 1.005 - 1.030   pH, UA 7.5 5.0 - 7.5   Color, UA Yellow Yellow   Appearance Ur Cloudy (A) Clear   Leukocytes,UA 3+ (A) Negative   Protein,UA Negative Negative/Trace   Glucose, UA Negative Negative   Ketones, UA Negative Negative   RBC, UA Trace (A) Negative   Bilirubin, UA Negative Negative   Urobilinogen, Ur 0.2 0.2 - 1.0 mg/dL   Nitrite, UA Negative Negative   Microscopic  Examination See below:   Bladder Scan (Post Void Residual) in office  Result Value Ref Range   Scan Result 183Ml    Assessment & Plan:   1. Straining to void PVR stably elevated, not in overt retention today.  He poorly tolerated a Foley catheter in the past due to his rectal tumor.  I do not recommend Foley catheter replacement today.  He feels that Flomax is helping him, so I counseled him to take 1 Flomax in the morning and 1 at night and we will plan for symptom recheck with possible Foley placement and possible CIC teaching if no improvement early next week. - Bladder Scan (Post Void Residual) in office - tamsulosin (FLOMAX) 0.4 MG CAPS capsule; Take 2 capsules (0.8 mg total) by mouth daily. Take one in the morning and one at night.  Dispense: 60 capsule; Refill: 11  2. Recurrent UTI UA appears grossly infected again today despite a recent course of culture appropriate antibiotics.  He denies true dysuria and it sounds like his pain is more related to straining to void.  Will send for culture and consider treating per results based on her symptom recheck early next week.  We discussed that he is at risk for colovesical fistula with his rectal cancer.  Agree with upcoming imaging per Dr. Johney Maine for further evaluation, which should reveal any underlying fistulous tracts. - Urinalysis, Complete - CULTURE, URINE COMPREHENSIVE  Return in about 4 days (around 09/29/2022) for Symptom recheck with PVR, possible Foley placement vs CIC teaching.  Debroah Loop, PA-C  [facility] [address] [place], [place] [address]

## 2022-09-28 LAB — CULTURE, URINE COMPREHENSIVE

## 2022-09-30 ENCOUNTER — Ambulatory Visit (INDEPENDENT_AMBULATORY_CARE_PROVIDER_SITE_OTHER): Payer: Medicare Other | Admitting: Physician Assistant

## 2022-09-30 ENCOUNTER — Other Ambulatory Visit: Payer: Self-pay | Admitting: *Deleted

## 2022-09-30 VITALS — BP 121/86 | HR 118 | Ht <= 58 in | Wt 129.0 lb

## 2022-09-30 DIAGNOSIS — R3916 Straining to void: Secondary | ICD-10-CM | POA: Diagnosis not present

## 2022-09-30 DIAGNOSIS — N138 Other obstructive and reflux uropathy: Secondary | ICD-10-CM | POA: Diagnosis not present

## 2022-09-30 DIAGNOSIS — R339 Retention of urine, unspecified: Secondary | ICD-10-CM

## 2022-09-30 DIAGNOSIS — C20 Malignant neoplasm of rectum: Secondary | ICD-10-CM | POA: Diagnosis not present

## 2022-09-30 DIAGNOSIS — N390 Urinary tract infection, site not specified: Secondary | ICD-10-CM

## 2022-09-30 LAB — BLADDER SCAN AMB NON-IMAGING: Scan Result: 11

## 2022-09-30 MED ORDER — SULFAMETHOXAZOLE-TRIMETHOPRIM 800-160 MG PO TABS: 1.0000 | ORAL_TABLET | Freq: Two times a day (BID) | ORAL | 0 refills | Status: AC

## 2022-09-30 NOTE — Progress Notes (Signed)
09/30/2022 4:07 PM   Kyle Larson 1956/02/02 [id number]  CC: Chief Complaint  Patient presents with   Follow-up    HPI: Kyle Larson is a 66 y.o. male with rectal cancer with obstructive urinary symptoms and a recent history of urinary retention who passed a voiding trial in June 2023 who presents today for symptom recheck on tamsulosin 0.4 mg every 12 hours.  Today he reports in retrospect he had stopped taking the Flomax entirely before seeing me last week.  His symptoms are largely improved now that he has resumed the medication.  He is having some lower abdominal pain.  His urine culture from the last week has since resulted with Klebsiella pneumoniae.  PVR 11 mL.  PMH: Past Medical History:  Diagnosis Date   AC joint dislocation    CAD (coronary artery disease)    CVA (cerebral vascular accident) ([place])    Metatarsal bone fracture    3rd metatarsal , left foot; April 2021   Syncope    June '22   Tobacco abuse     Surgical History: Past Surgical History:  Procedure Laterality Date   ACROMIO-CLAVICULAR JOINT REPAIR Left 09/11/2016   Procedure: ACROMIO-CLAVICULAR RECONSTRUCTION;  Surgeon: Corky Mull, MD;  Location: [facility];  Service: Orthopedics;  Laterality: Left;  clavicle   FLEXIBLE SIGMOIDOSCOPY N/A 02/24/2022   Procedure: FLEXIBLE SIGMOIDOSCOPY;  Surgeon: Lesly Rubenstein, MD;  Location: [facility];  Service: Endoscopy;  Laterality: N/A;   IR IMAGING GUIDED PORT INSERTION  03/10/2022   LEFT HEART CATH AND CORONARY ANGIOGRAPHY N/A 05/20/2021   Procedure: LEFT HEART CATH AND CORONARY ANGIOGRAPHY with coronary intervention;  Surgeon: [name], MD;  Location: [facility];  Service: Cardiovascular;  Laterality: N/A;   MANDIBLE RECONSTRUCTION     TONSILLECTOMY     AGE 71   XI ROBOTIC ASSISTED INGUINAL HERNIA REPAIR WITH MESH Right 03/26/2020   Procedure: XI ROBOTIC ASSISTED INGUINAL HERNIA REPAIR WITH MESH;  Surgeon: Herbert Pun, MD;   Location: [facility];  Service: General;  Laterality: Right;    Home Medications:  Allergies as of 09/30/2022   No Known Allergies      Medication List        Accurate as of September 30, 2022  4:07 PM. If you have any questions, ask your nurse or doctor.          acetaminophen 325 MG tablet Commonly known as: Tylenol Take 2 tablets (650 mg total) by mouth every 6 (six) hours as needed for mild pain.   ALPRAZolam 0.25 MG tablet Commonly known as: XANAX Take 1 tablet (0.25 mg total) by mouth at bedtime as needed for sleep.   atorvastatin 40 MG tablet Commonly known as: LIPITOR Take 1 tablet (40 mg total) by mouth daily.   capecitabine 500 MG tablet Commonly known as: Xeloda Take 2 tablets (1,000 mg total) by mouth 2 (two) times daily after a meal. Take Monday-Friday. Take only on days of radiation. Take along with '150mg'$  tablets.   capecitabine 150 MG tablet Commonly known as: XELODA Take 2 tablets (300 mg total) by mouth 2 (two) times daily after a meal. Take Monday-Friday. Take only on days of radiation. Take along with '500mg'$  tablets.   feeding supplement Liqd Take 237 mLs by mouth 3 (three) times daily between meals.   fentaNYL 25 MCG/HR Commonly known as: [place] 1 patch onto the skin every 3 (three) days.   ferrous sulfate 325 (65 FE) MG tablet Take 1  tablet (325 mg total) by mouth daily.   folic acid 1 MG tablet Commonly known as: FOLVITE Take 1 tablet (1 mg total) by mouth daily.   lidocaine-prilocaine cream Commonly known as: EMLA Apply to affected area once   lidocaine-prilocaine cream Commonly known as: EMLA Apply 1 application  topically as needed. Apply cream to port 1 hour prior to chemotherapy. Cover with plastic wrap.   Multi-Vitamin tablet Take by mouth.   multivitamin with minerals tablet Take 1 tablet by mouth daily.   nicotine 21 mg/24hr patch Commonly known as: NICODERM CQ - dosed in mg/24 hours Place 21 mg onto the skin  daily.   ondansetron 8 MG tablet Commonly known as: ZOFRAN TAKE 1 TABLET 2 TIMES DAILY AS NEEDED FOR REFRACTORY NAUSEA / VOMITING. START DAY 3 AFTER CHEMO   Oxycodone HCl 10 MG Tabs Take 1 tablet (10 mg total) by mouth every 6 (six) hours as needed. Okay to take 1/2 tablet as needed.   pantoprazole 40 MG tablet Commonly known as: Protonix Take 1 tablet (40 mg total) by mouth daily.   polyethylene glycol 17 g packet Commonly known as: MIRALAX / GLYCOLAX Take 17 g by mouth daily.   prochlorperazine 10 MG tablet Commonly known as: COMPAZINE Take 1 tablet (10 mg total) by mouth every 6 (six) hours as needed (Nausea or vomiting).   sildenafil 25 MG tablet Commonly known as: Viagra Take 1 tablet (25 mg total) by mouth daily as needed for erectile dysfunction.   sotalol 80 MG tablet Commonly known as: BETAPACE Take 80 mg by mouth daily.   sulfamethoxazole-trimethoprim 800-160 MG tablet Commonly known as: BACTRIM DS Take 1 tablet by mouth 2 (two) times daily for 7 days. Started by: Debroah Loop, PA-C   tamsulosin 0.4 MG Caps capsule Commonly known as: FLOMAX Take 2 capsules (0.8 mg total) by mouth daily. Take one in the morning and one at night.   thiamine 100 MG tablet Commonly known as: VITAMIN B1 Take 1 tablet (100 mg total) by mouth daily.        Allergies:  No Known Allergies  Family History: Family History  Problem Relation Age of Onset   Diabetes Mother    Prostate cancer Father     Social History:   reports that he has been smoking cigarettes. He has a 3.75 pack-year smoking history. He has been exposed to tobacco smoke. He has never used smokeless tobacco. He reports current alcohol use of about 42.0 standard drinks of alcohol per week. He reports that he does not use drugs.  Physical Exam: BP 121/86   Pulse (!) 118   Ht (!) 6" (0.152 m)   Wt 129 lb (58.5 kg)   BMI 2519.36 kg/m   Constitutional:  Alert and oriented, no acute distress,  nontoxic appearing HEENT: [place], AT Cardiovascular: No clubbing, cyanosis, or edema Respiratory: Normal respiratory effort, no increased work of breathing Skin: No rashes, bruises or suspicious lesions Neurologic: Grossly intact, no focal deficits, moving all 4 extremities Psychiatric: Normal mood and affect  Laboratory Data: Results for orders placed or performed in visit on 09/30/22  Bladder Scan (Post Void Residual) in office  Result Value Ref Range   Scan Result 11    Assessment & Plan:   1. Straining to void Resolved after resuming Flomax.  We will plan to continue this.  He is emptying appropriately today, no indication for Foley catheter at this time. - Bladder Scan (Post Void Residual) in office  2. Recurrent  UTI We will start culture appropriate Bactrim for possible UTI.  I encouraged him to keep plans for abdominal pelvic imaging later this week per general surgery, which should also include information about any possible colovesical fistula could be contributory. - sulfamethoxazole-trimethoprim (BACTRIM DS) 800-160 MG tablet; Take 1 tablet by mouth 2 (two) times daily for 7 days.  Dispense: 14 tablet; Refill: 0   Return if symptoms worsen or fail to improve.  Debroah Loop, PA-C  [facility] [address] [place], [place] [address] [phone]

## 2022-10-02 ENCOUNTER — Ambulatory Visit: Admission: RE | Admit: 2022-10-02 | Payer: Medicare Other | Source: Ambulatory Visit

## 2022-10-02 ENCOUNTER — Ambulatory Visit: Payer: Medicare Other

## 2022-10-03 ENCOUNTER — Other Ambulatory Visit: Payer: Self-pay | Admitting: *Deleted

## 2022-10-03 ENCOUNTER — Inpatient Hospital Stay: Payer: Medicare Other | Attending: Oncology | Admitting: Oncology

## 2022-10-03 VITALS — BP 106/73 | HR 85 | Temp 97.6°F | Resp 18 | Wt 122.0 lb

## 2022-10-03 DIAGNOSIS — C20 Malignant neoplasm of rectum: Secondary | ICD-10-CM

## 2022-10-03 DIAGNOSIS — D6489 Other specified anemias: Secondary | ICD-10-CM | POA: Diagnosis not present

## 2022-10-03 DIAGNOSIS — G8929 Other chronic pain: Secondary | ICD-10-CM | POA: Diagnosis not present

## 2022-10-03 DIAGNOSIS — M549 Dorsalgia, unspecified: Secondary | ICD-10-CM | POA: Diagnosis not present

## 2022-10-03 MED ORDER — OXYCODONE HCL 10 MG PO TABS: 10.0000 mg | ORAL_TABLET | Freq: Four times a day (QID) | ORAL | 0 refills | Status: DC | PRN

## 2022-10-03 NOTE — Telephone Encounter (Signed)
Request for oxycodone refill

## 2022-10-03 NOTE — Progress Notes (Signed)
Pt has zero energy, having abd cramping and watery stools through colostomy. Eating normally. Pt had a episode of passing out yesterday. No longer taking any chemo.

## 2022-10-03 NOTE — Progress Notes (Signed)
Kyle Larson  [phone] [phone]  ID: Kyle Larson OB: May 11, 1956  MR#: [id number]  AUQ#:[id number]  Patient Care Team: Kathrine Haddock, NP as PCP - General (Nurse Practitioner) Jules Husbands, MD as Consulting Physician (General Surgery) Clent Jacks, RN as Oncology Nurse Navigator Grayland Ormond, Kathlene November, MD as Consulting Physician (Oncology) [name], MD as Consulting Physician (Cardiology) Michael Boston, MD as Consulting Physician (Colon and Rectal Surgery) Lesly Rubenstein, MD as Consulting Physician (Gastroenterology) Billey Co, MD as Consulting Physician (Urology)  CHIEF COMPLAINT: Stage IIa adenocarcinoma of the rectum.  INTERVAL HISTORY: Patient returns to clinic today for repeat laboratory work and further evaluation.  He has now completed neoadjuvant XRT with concurrent capecitabine.  He continues to have significant pain and diarrhea, but states these both are slowly improving.  He has chronic weakness and fatigue.  He has no neurologic complaints.  He denies any recent fevers or illnesses.  He has no chest pain, shortness of breath, cough, or hemoptysis.  He has no nausea, vomiting, or constipation.  He does not report any melena or hematochezia.  He has no urinary complaints.  Patient offers no further specific complaints today.  REVIEW OF SYSTEMS:   Review of Systems  Constitutional:  Positive for malaise/fatigue. Negative for fever and weight loss.  Respiratory: Negative.  Negative for cough, hemoptysis and shortness of breath.   Cardiovascular: Negative.  Negative for chest pain and leg swelling.  Gastrointestinal:  Positive for diarrhea. Negative for abdominal pain, blood in stool, constipation and melena.  Genitourinary: Negative.  Negative for dysuria.  Musculoskeletal:  Positive for back pain.  Skin: Negative.  Negative for rash.  Neurological:  Positive for weakness. Negative for dizziness, speech change,  focal weakness and headaches.  Psychiatric/Behavioral: Negative.  The patient is not nervous/anxious and does not have insomnia.     As per HPI. Otherwise, a complete review of systems is negative.  PAST MEDICAL HISTORY: Past Medical History:  Diagnosis Date   AC joint dislocation    CAD (coronary artery disease)    CVA (cerebral vascular accident) ([place])    Metatarsal bone fracture    3rd metatarsal , left foot; April 2021   Syncope    June '22   Tobacco abuse     PAST SURGICAL HISTORY: Past Surgical History:  Procedure Laterality Date   ACROMIO-CLAVICULAR JOINT REPAIR Left 09/11/2016   Procedure: ACROMIO-CLAVICULAR RECONSTRUCTION;  Surgeon: Corky Mull, MD;  Location: [facility];  Service: Orthopedics;  Laterality: Left;  clavicle   FLEXIBLE SIGMOIDOSCOPY N/A 02/24/2022   Procedure: FLEXIBLE SIGMOIDOSCOPY;  Surgeon: Lesly Rubenstein, MD;  Location: [facility];  Service: Endoscopy;  Laterality: N/A;   IR IMAGING GUIDED PORT INSERTION  03/10/2022   LEFT HEART CATH AND CORONARY ANGIOGRAPHY N/A 05/20/2021   Procedure: LEFT HEART CATH AND CORONARY ANGIOGRAPHY with coronary intervention;  Surgeon: [name], MD;  Location: [facility];  Service: Cardiovascular;  Laterality: N/A;   MANDIBLE RECONSTRUCTION     TONSILLECTOMY     AGE 66   XI ROBOTIC ASSISTED INGUINAL HERNIA REPAIR WITH MESH Right 03/26/2020   Procedure: XI ROBOTIC ASSISTED INGUINAL HERNIA REPAIR WITH MESH;  Surgeon: Herbert Pun, MD;  Location: [facility];  Service: General;  Laterality: Right;    FAMILY HISTORY: Family History  Problem Relation Age of Onset   Diabetes Mother    Prostate cancer Father     ADVANCED DIRECTIVES (Y/N):  N  HEALTH MAINTENANCE: Social  History   Tobacco Use   Smoking status: Some Days    Packs/day: 0.25    Years: 15.00    Total pack years: 3.75    Types: Cigarettes    Passive exposure: Current   Smokeless tobacco: Never  Vaping Use   Vaping Use: Never  used  Substance Use Topics   Alcohol use: Yes    Alcohol/week: 42.0 standard drinks of alcohol    Types: 42 Cans of beer per week    Comment: 3 every other day   Drug use: No     Colonoscopy:  PAP:  Bone density:  Lipid panel:  No Known Allergies  Current Outpatient Medications  Medication Sig Dispense Refill   acetaminophen (TYLENOL) 325 MG tablet Take 2 tablets (650 mg total) by mouth every 6 (six) hours as needed for mild pain.     ALPRAZolam (XANAX) 0.25 MG tablet Take 1 tablet (0.25 mg total) by mouth at bedtime as needed for sleep. 30 tablet 0   atorvastatin (LIPITOR) 40 MG tablet Take 1 tablet (40 mg total) by mouth daily. 30 tablet 0   feeding supplement (ENSURE ENLIVE / ENSURE PLUS) LIQD Take 237 mLs by mouth 3 (three) times daily between meals. 21330 mL 0   fentaNYL (DURAGESIC) 25 MCG/HR Place 1 patch onto the skin every 3 (three) days. 10 patch 0   lidocaine-prilocaine (EMLA) cream Apply to affected area once 30 g 3   lidocaine-prilocaine (EMLA) cream Apply 1 application  topically as needed. Apply cream to port 1 hour prior to chemotherapy. Cover with plastic wrap. 30 g 3   loperamide (IMODIUM A-D) 2 MG tablet Take 2 mg by mouth 4 (four) times daily as needed for diarrhea or loose stools.     Multiple Vitamin (MULTI-VITAMIN) tablet Take by mouth.     nicotine (NICODERM CQ - DOSED IN MG/24 HOURS) 21 mg/24hr patch Place 21 mg onto the skin daily.     pantoprazole (PROTONIX) 40 MG tablet Take 1 tablet (40 mg total) by mouth daily. 30 tablet 2   sildenafil (VIAGRA) 25 MG tablet Take 1 tablet (25 mg total) by mouth daily as needed for erectile dysfunction. 10 tablet 0   sotalol (BETAPACE) 80 MG tablet Take 80 mg by mouth daily.     sulfamethoxazole-trimethoprim (BACTRIM DS) 800-160 MG tablet Take 1 tablet by mouth 2 (two) times daily for 7 days. 14 tablet 0   tamsulosin (FLOMAX) 0.4 MG CAPS capsule Take 2 capsules (0.8 mg total) by mouth daily. Take one in the morning and one  at night. 60 capsule 11   capecitabine (XELODA) 150 MG tablet Take 2 tablets (300 mg total) by mouth 2 (two) times daily after a meal. Take Monday-Friday. Take only on days of radiation. Take along with '500mg'$  tablets. (Patient not taking: Reported on 10/03/2022) 100 tablet 0   capecitabine (XELODA) 500 MG tablet Take 2 tablets (1,000 mg total) by mouth 2 (two) times daily after a meal. Take Monday-Friday. Take only on days of radiation. Take along with '150mg'$  tablets. (Patient not taking: Reported on 10/03/2022) 100 tablet 0   Multiple Vitamins-Minerals (MULTIVITAMIN WITH MINERALS) tablet Take 1 tablet by mouth daily.     ondansetron (ZOFRAN) 8 MG tablet TAKE 1 TABLET 2 TIMES DAILY AS NEEDED FOR REFRACTORY NAUSEA / VOMITING. START DAY 3 AFTER CHEMO (Patient not taking: Reported on 10/03/2022) 60 tablet 2   Oxycodone HCl 10 MG TABS Take 1 tablet (10 mg total) by mouth every 6 (six)  hours as needed. Okay to take 1/2 tablet as needed. 60 tablet 0   polyethylene glycol (MIRALAX / GLYCOLAX) 17 g packet Take 17 g by mouth daily. (Patient not taking: Reported on 10/03/2022)     prochlorperazine (COMPAZINE) 10 MG tablet Take 1 tablet (10 mg total) by mouth every 6 (six) hours as needed (Nausea or vomiting). (Patient not taking: Reported on 10/03/2022) 60 tablet 2   thiamine 100 MG tablet Take 1 tablet (100 mg total) by mouth daily. (Patient not taking: Reported on 10/03/2022) 30 tablet 0   No current facility-administered medications for this visit.    OBJECTIVE: Vitals:   10/03/22 1039  BP: 106/73  Pulse: 85  Resp: 18  Temp: 97.6 F (36.4 C)  SpO2: 99%     Body mass index is 2,382.65 kg/m.    ECOG FS:1 - Symptomatic but completely ambulatory  General: Well-developed, well-nourished, no acute distress. Eyes: Pink conjunctiva, anicteric sclera. HEENT: Normocephalic, moist mucous membranes. Lungs: No audible wheezing or coughing. Heart: Regular rate and rhythm. Abdomen: Soft, nontender, no obvious  distention.  Colostomy bag noted. Musculoskeletal: No edema, cyanosis, or clubbing. Neuro: Alert, answering all questions appropriately. Cranial nerves grossly intact. Skin: No rashes or petechiae noted. Psych: Normal affect.   LAB RESULTS:  Lab Results  Component Value Date   NA 133 (L) 08/25/2022   K 3.5 08/25/2022   CL 101 08/25/2022   CO2 27 08/25/2022   GLUCOSE 117 (H) 08/25/2022   BUN 5 (L) 08/25/2022   CREATININE 0.38 (L) 08/25/2022   CALCIUM 8.3 (L) 08/25/2022   PROT 6.2 (L) 08/25/2022   ALBUMIN 3.0 (L) 08/25/2022   AST 24 08/25/2022   ALT 12 08/25/2022   ALKPHOS 75 08/25/2022   BILITOT 0.4 08/25/2022   GFRNONAA >60 08/25/2022   GFRAA >60 10/03/2016    Lab Results  Component Value Date   WBC 6.3 08/25/2022   NEUTROABS 4.0 08/25/2022   HGB 10.5 (L) 08/25/2022   HCT 30.3 (L) 08/25/2022   MCV 101.3 (H) 08/25/2022   PLT 168 08/25/2022     STUDIES: No results found.  ASSESSMENT: Stage IIa adenocarcinoma of the rectum.  PLAN:    Stage IIa adenocarcinoma of the rectum: CT scan results of chest, abdomen, and pelvis reviewed independently and it does not appear patient has metastatic disease.  MRI of the pelvis completed on March 03, 2022 confirmed stage of disease.  CEA only mildly elevated at 6.7.  Patient has undergone diverting colostomy.  Patient completed neoadjuvant chemotherapy with 8 cycles of FOLFOX on July 09, 2022.  He has now completed neoadjuvant XRT with concurrent capecitabine.  Patient has repeat imaging next week and then follow-up with surgery soon thereafter.  He does not have a surgical date set at this point yet.  No further intervention is needed at this time.  Return to clinic 1 to 2 weeks after his surgery.   Pain: Chronic and unchanged.  Mostly related to his ostomy.  His back pain seems musculoskeletal.  Continue oxycodone 10 mg as needed.   Anemia: Chronic and unchanged.  Patient's most recent hemoglobin is 10.5. Thrombocytopenia:  Resolved. Hyponatremia: Chronic and unchanged.  Patient's most recent sodium is 133. Mouth pain/poor dentition: Patient does not complain of this today.  Patient was previously given the number of the local dentists to call.   Pain at ostomy site: Surgery as above.  Narcotic regimen as above. Hypokalemia: Resolved. Hypomagnesia: Resolved. Neutropenia: Resolved.  Patient expressed understanding and was in  agreement with this plan. He also understands that He can call clinic at any time with any questions, concerns, or complaints.    Cancer Staging  Adenocarcinoma of rectum [facility]) Staging form: Colon and Rectum, AJCC 8th Edition - Clinical stage from 03/10/2022: Stage IIA (cT3, cN0, cM0) - Signed by Lloyd Huger, MD on 03/10/2022 Stage prefix: Initial diagnosis Total positive nodes: 0  Lloyd Huger, MD   10/04/2022 6:38 AM

## 2022-10-10 ENCOUNTER — Ambulatory Visit
Admission: RE | Admit: 2022-10-10 | Discharge: 2022-10-10 | Disposition: A | Discharge status: Home / Self Care | Payer: Medicare Other | Source: Ambulatory Visit | Attending: Surgery | Admitting: Surgery

## 2022-10-10 DIAGNOSIS — R590 Localized enlarged lymph nodes: Secondary | ICD-10-CM | POA: Diagnosis not present

## 2022-10-10 DIAGNOSIS — C20 Malignant neoplasm of rectum: Secondary | ICD-10-CM | POA: Diagnosis not present

## 2022-10-10 DIAGNOSIS — J9809 Other diseases of bronchus, not elsewhere classified: Secondary | ICD-10-CM | POA: Diagnosis not present

## 2022-10-10 DIAGNOSIS — K6389 Other specified diseases of intestine: Secondary | ICD-10-CM | POA: Diagnosis not present

## 2022-10-10 DIAGNOSIS — N289 Disorder of kidney and ureter, unspecified: Secondary | ICD-10-CM | POA: Diagnosis not present

## 2022-10-10 DIAGNOSIS — K6289 Other specified diseases of anus and rectum: Secondary | ICD-10-CM | POA: Diagnosis not present

## 2022-10-10 DIAGNOSIS — K769 Liver disease, unspecified: Secondary | ICD-10-CM | POA: Diagnosis not present

## 2022-10-10 DIAGNOSIS — J432 Centrilobular emphysema: Secondary | ICD-10-CM | POA: Diagnosis not present

## 2022-10-10 MED ORDER — IOHEXOL 300 MG/ML  SOLN
80.0000 mL | Freq: Once | INTRAMUSCULAR | Status: AC | PRN
  Administered 2022-10-10: 80 mL via INTRAVENOUS

## 2022-10-10 MED ORDER — HEPARIN SOD (PORK) LOCK FLUSH 100 UNIT/ML IV SOLN
500.0000 [IU] | Freq: Once | INTRAVENOUS | Status: DC
  Filled 2022-10-10: qty 5

## 2022-10-10 MED ORDER — HEPARIN SOD (PORK) LOCK FLUSH 100 UNIT/ML IV SOLN
INTRAVENOUS | Status: AC
  Filled 2022-10-10: qty 5

## 2022-10-14 DIAGNOSIS — R590 Localized enlarged lymph nodes: Secondary | ICD-10-CM | POA: Diagnosis not present

## 2022-10-14 DIAGNOSIS — C20 Malignant neoplasm of rectum: Secondary | ICD-10-CM | POA: Diagnosis not present

## 2022-10-22 ENCOUNTER — Ambulatory Visit
Admission: RE | Admit: 2022-10-22 | Discharge: 2022-10-22 | Disposition: A | Discharge status: Home / Self Care | Payer: Medicare Other | Source: Ambulatory Visit | Attending: Radiation Oncology | Admitting: Radiation Oncology

## 2022-10-22 VITALS — BP 123/81 | HR 106 | Temp 97.9°F | Resp 16 | Ht 72.0 in | Wt 131.5 lb

## 2022-10-22 DIAGNOSIS — C20 Malignant neoplasm of rectum: Secondary | ICD-10-CM | POA: Diagnosis not present

## 2022-10-22 DIAGNOSIS — Z923 Personal history of irradiation: Secondary | ICD-10-CM | POA: Insufficient documentation

## 2022-10-22 NOTE — Progress Notes (Signed)
Referral sent to Dr. Fanny Skates at [facility] for opinion regarding rectal cancer. Case was reviewed at [facility] and unfortunately not a surgical candidate.

## 2022-10-22 NOTE — Progress Notes (Signed)
Radiation Oncology Follow up Note  Name: Kyle Larson   Date:   10/22/2022 MRN:  [id number] DOB: 1956-04-18    This 66 y.o. male presents to the clinic today for 1 month follow-up status post concurrent chemoradiation therapy for stage IIa (T3 N0 M0) adenocarcinoma of the rectum and patient status post FOLFOX chemotherapy.  REFERRING PROVIDER: [name], MD  HPI: Patient is a 66 year old male now out 1 month having completed concurrent chemoradiation therapy after FOLFOX chemotherapy in a neoadjuvant fashion for a T3 adenocarcinoma the rectum seen today in routine follow-up he is somewhat improved overall general condition looks improved.  He still is on narcotic analgesics for pain.  He has been declined for surgery in [place] is scheduled to meet with [facility] at some point for surgery.Marland Kitchen  He does have a functioning ostomy and this is mostly reason for his pain.  Recent MRI of his pelvis shows large fungating rectal mass measuring now 7.5 cm in greatest dimension.  This is indicative of T4 N1 disease.  COMPLICATIONS OF TREATMENT: none  FOLLOW UP COMPLIANCE: keeps appointments   PHYSICAL EXAM:  BP 123/81   Pulse (!) 106   Temp 97.9 F (36.6 C)   Resp 16   Ht 6' (1.829 m)   Wt 131 lb 8 oz (59.6 kg)   BMI 17.83 kg/m  Patient has a functioning ostomy.  Frail-appearing male in NAD.  Well-developed well-nourished patient in NAD. HEENT reveals PERLA, EOMI, discs not visualized.  Oral cavity is clear. No oral mucosal lesions are identified. Neck is clear without evidence of cervical or supraclavicular adenopathy. Lungs are clear to A&P. Cardiac examination is essentially unremarkable with regular rate and rhythm without murmur rub or thrill. Abdomen is benign with no organomegaly or masses noted. Motor sensory and DTR levels are equal and symmetric in the upper and lower extremities. Cranial nerves II through XII are grossly intact. Proprioception is intact. No peripheral adenopathy or  edema is identified. No motor or sensory levels are noted. Crude visual fields are within normal range.  RADIOLOGY RESULTS: CT scan and MRI scans reviewed  PLAN: Present time patient will establish care with [facility] for surgical possible resection.  I have asked to see him back in 4 months for follow-up.  Depending on surgical pathology findings may need further chemotherapy.  Patient comprehends her recommendations well.  I would like to take this opportunity to thank you for allowing me to participate in the care of your patient.Noreene Filbert, MD

## 2022-10-23 ENCOUNTER — Other Ambulatory Visit: Payer: Self-pay

## 2022-10-23 MED ORDER — OXYCODONE HCL 10 MG PO TABS: 10.0000 mg | ORAL_TABLET | Freq: Four times a day (QID) | ORAL | 0 refills | Status: DC | PRN

## 2022-10-30 DIAGNOSIS — C20 Malignant neoplasm of rectum: Secondary | ICD-10-CM | POA: Diagnosis not present

## 2022-11-03 ENCOUNTER — Telehealth: Payer: Self-pay | Admitting: *Deleted

## 2022-11-03 ENCOUNTER — Telehealth: Payer: Self-pay | Admitting: Surgery

## 2022-11-03 NOTE — Telephone Encounter (Signed)
Spoke with patient -he stated he needs Ostomy  supplies and that he has to pay 100.00 dollars for 5 bags due to his insurance not covering it.

## 2022-11-03 NOTE — Telephone Encounter (Signed)
Patient calls asking if we can help him with getting his ostomy supplies from a company that would bill his health insurance?  Please call him. Thank you.

## 2022-11-03 NOTE — Telephone Encounter (Signed)
Patient called asking for assistance getting his ostomy supplies from a supplier that can file his insurance so he does not have to pay out of pocket for them

## 2022-11-03 NOTE — Telephone Encounter (Signed)
CAll returned to patient and advised that he needs to contact Dr Celedonio Miyamoto office regarding this and I gave him, the office number to contact his staff

## 2022-11-04 ENCOUNTER — Telehealth: Payer: Self-pay

## 2022-11-04 DIAGNOSIS — C20 Malignant neoplasm of rectum: Secondary | ICD-10-CM | POA: Diagnosis not present

## 2022-11-04 NOTE — Telephone Encounter (Signed)
Spoke with Elberta Fortis at [facility]. Demographics were provided and contact information for the patient. Elberta Fortis stated he would reach out to the patient.   Notified patient that Eldridge Abrahams will be contacting him within the next few days.

## 2022-11-05 ENCOUNTER — Telehealth: Payer: Self-pay

## 2022-11-05 NOTE — Telephone Encounter (Signed)
Treid to reach patient to see if he has been contacted regarding Ostomy supplies from [employer]. Left message for patient to call back if he hasn't heard from [facility] by end of day today.

## 2022-11-06 ENCOUNTER — Inpatient Hospital Stay: Payer: Medicare Other | Attending: Oncology | Admitting: Oncology

## 2022-11-06 ENCOUNTER — Encounter: Payer: Self-pay | Admitting: Oncology

## 2022-11-06 ENCOUNTER — Inpatient Hospital Stay (HOSPITAL_BASED_OUTPATIENT_CLINIC_OR_DEPARTMENT_OTHER): Payer: Medicare Other | Admitting: Hospice and Palliative Medicine

## 2022-11-06 DIAGNOSIS — Z933 Colostomy status: Secondary | ICD-10-CM | POA: Insufficient documentation

## 2022-11-06 DIAGNOSIS — Z923 Personal history of irradiation: Secondary | ICD-10-CM | POA: Insufficient documentation

## 2022-11-06 DIAGNOSIS — Z9221 Personal history of antineoplastic chemotherapy: Secondary | ICD-10-CM | POA: Insufficient documentation

## 2022-11-06 DIAGNOSIS — C20 Malignant neoplasm of rectum: Secondary | ICD-10-CM | POA: Diagnosis not present

## 2022-11-06 MED ORDER — FENTANYL 50 MCG/HR TD PT72: 1.0000 | MEDICATED_PATCH | TRANSDERMAL | 0 refills | Status: DC

## 2022-11-06 MED ORDER — DEXAMETHASONE 2 MG PO TABS: 2.0000 mg | ORAL_TABLET | Freq: Every day | ORAL | 1 refills | Status: DC

## 2022-11-06 MED ORDER — OXYCODONE HCL 10 MG PO TABS: 10.0000 mg | ORAL_TABLET | Freq: Four times a day (QID) | ORAL | 0 refills | Status: DC | PRN

## 2022-11-06 NOTE — Progress Notes (Signed)
Kyle Larson  [phone] [phone]  ID: [name] OB: Sep 20, 1956  MR#: [id number]  CHY#:[id number]  Patient Care Team: Kathrine Haddock, NP as PCP - General (Nurse Practitioner) Jules Husbands, MD as Consulting Physician (General Surgery) Clent Jacks, RN as Oncology Nurse Navigator Grayland Ormond, Kathlene November, MD as Consulting Physician (Oncology) [name], MD as Consulting Physician (Cardiology) Michael Boston, MD as Consulting Physician (Colon and Rectal Surgery) Lesly Rubenstein, MD as Consulting Physician (Gastroenterology) Billey Co, MD as Consulting Physician (Urology)  CHIEF COMPLAINT: Stage IIa adenocarcinoma of the rectum.  INTERVAL HISTORY: Patient returns to clinic today for repeat laboratory work and further evaluation.  He was evaluated in both [place] and at [facility] and was determined to have progressive disease that is on resectable.  Patient has declined any further chemotherapy.  He continues to have significant pain.  He has chronic weakness and fatigue. He has no neurologic complaints.  He denies any recent fevers or illnesses.  He has no chest pain, shortness of breath, cough, or hemoptysis.  He has no nausea, vomiting, or constipation.  He does not report any melena or hematochezia.  He has no urinary complaints.  Patient feels generally terrible, but offers no further specific complaints today.  REVIEW OF SYSTEMS:   Review of Systems  Constitutional:  Positive for malaise/fatigue. Negative for fever and weight loss.  Respiratory: Negative.  Negative for cough, hemoptysis and shortness of breath.   Cardiovascular: Negative.  Negative for chest pain and leg swelling.  Gastrointestinal: Negative.  Negative for abdominal pain, blood in stool, constipation, diarrhea and melena.  Genitourinary: Negative.  Negative for dysuria.  Musculoskeletal:  Positive for back pain.  Skin: Negative.  Negative for rash.   Neurological:  Positive for weakness. Negative for dizziness, speech change, focal weakness and headaches.  Psychiatric/Behavioral: Negative.  The patient is not nervous/anxious and does not have insomnia.     As per HPI. Otherwise, a complete review of systems is negative.  PAST MEDICAL HISTORY: Past Medical History:  Diagnosis Date   AC joint dislocation    CAD (coronary artery disease)    CVA (cerebral vascular accident) ([place])    Metatarsal bone fracture    3rd metatarsal , left foot; April 2021   Syncope    June '22   Tobacco abuse     PAST SURGICAL HISTORY: Past Surgical History:  Procedure Laterality Date   ACROMIO-CLAVICULAR JOINT REPAIR Left 09/11/2016   Procedure: ACROMIO-CLAVICULAR RECONSTRUCTION;  Surgeon: Corky Mull, MD;  Location: [facility] ORS;  Service: Orthopedics;  Laterality: Left;  clavicle   FLEXIBLE SIGMOIDOSCOPY N/A 02/24/2022   Procedure: FLEXIBLE SIGMOIDOSCOPY;  Surgeon: Lesly Rubenstein, MD;  Location: [facility] ENDOS[facility];  Service: Endoscopy;  Laterality: N/A;   IR IMAGING GUIDED PORT INSERTION  03/10/2022   LEFT HEART CATH AND CORONARY ANGIOGRAPHY N/A 05/20/2021   Procedure: LEFT HEART CATH AND CORONARY ANGIOGRAPHY with coronary intervention;  Surgeon: [name], MD;  Location: [place] [facility];  Service: Cardiovascular;  Laterality: N/A;   MANDIBLE RECONSTRUCTION     TONSILLECTOMY     AGE 66   XI ROBOTIC ASSISTED INGUINAL HERNIA REPAIR WITH MESH Right 03/26/2020   Procedure: XI ROBOTIC ASSISTED INGUINAL HERNIA REPAIR WITH MESH;  Surgeon: Herbert Pun, MD;  Location: [facility] ORS;  Service: General;  Laterality: Right;    FAMILY HISTORY: Family History  Problem Relation Age of Onset   Diabetes Mother    Prostate cancer Father  ADVANCED DIRECTIVES (Y/N):  N  HEALTH MAINTENANCE: Social History   Tobacco Use   Smoking status: Some Days    Packs/day: 0.25    Years: 15.00    Total pack years: 3.75    Types: Cigarettes    Passive  exposure: Current   Smokeless tobacco: Never  Vaping Use   Vaping Use: Never used  Substance Use Topics   Alcohol use: Yes    Alcohol/week: 42.0 standard drinks of alcohol    Types: 42 Cans of beer per week    Comment: 3 every other day   Drug use: No     Colonoscopy:  PAP:  Bone density:  Lipid panel:  No Known Allergies  Current Outpatient Medications  Medication Sig Dispense Refill   acetaminophen (TYLENOL) 325 MG tablet Take 2 tablets (650 mg total) by mouth every 6 (six) hours as needed for mild pain.     ALPRAZolam (XANAX) 0.25 MG tablet Take 1 tablet (0.25 mg total) by mouth at bedtime as needed for sleep. 30 tablet 0   atorvastatin (LIPITOR) 40 MG tablet Take 1 tablet (40 mg total) by mouth daily. 30 tablet 0   feeding supplement (ENSURE ENLIVE / ENSURE PLUS) LIQD Take 237 mLs by mouth 3 (three) times daily between meals. 21330 mL 0   fentaNYL (DURAGESIC) 50 MCG/HR Place 1 patch onto the skin every 3 (three) days. 10 patch 0   lidocaine-prilocaine (EMLA) cream Apply to affected area once 30 g 3   lidocaine-prilocaine (EMLA) cream Apply 1 application  topically as needed. Apply cream to port 1 hour prior to chemotherapy. Cover with plastic wrap. 30 g 3   Multiple Vitamin (MULTI-VITAMIN) tablet Take by mouth.     Multiple Vitamins-Minerals (MULTIVITAMIN WITH MINERALS) tablet Take 1 tablet by mouth daily.     pantoprazole (PROTONIX) 40 MG tablet Take 1 tablet (40 mg total) by mouth daily. 30 tablet 2   sildenafil (VIAGRA) 25 MG tablet Take 1 tablet (25 mg total) by mouth daily as needed for erectile dysfunction. 10 tablet 0   sotalol (BETAPACE) 80 MG tablet Take 80 mg by mouth daily.     tamsulosin (FLOMAX) 0.4 MG CAPS capsule Take 2 capsules (0.8 mg total) by mouth daily. Take one in the morning and one at night. 60 capsule 11   dexamethasone (DECADRON) 2 MG tablet Take 1 tablet (2 mg total) by mouth daily. 30 tablet 1   loperamide (IMODIUM A-D) 2 MG tablet Take 2 mg by mouth  4 (four) times daily as needed for diarrhea or loose stools. (Patient not taking: Reported on 65/14/2023)     nicotine (NICODERM CQ - DOSED IN MG/24 HOURS) 21 mg/24hr patch Place 21 mg onto the skin daily. (Patient not taking: Reported on 65/14/2023)     ondansetron (ZOFRAN) 8 MG tablet TAKE 1 TABLET 2 TIMES DAILY AS NEEDED FOR REFRACTORY NAUSEA / VOMITING. START DAY 3 AFTER CHEMO (Patient not taking: Reported on 10/03/2022) 60 tablet 2   Oxycodone HCl 10 MG TABS Take 1 tablet (10 mg total) by mouth every 6 (six) hours as needed. Okay to take 1/2 tablet as needed. 90 tablet 0   polyethylene glycol (MIRALAX / GLYCOLAX) 17 g packet Take 17 g by mouth daily. (Patient not taking: Reported on 10/03/2022)     prochlorperazine (COMPAZINE) 10 MG tablet Take 1 tablet (10 mg total) by mouth every 6 (six) hours as needed (Nausea or vomiting). (Patient not taking: Reported on 10/03/2022) 60 tablet 2  thiamine 100 MG tablet Take 1 tablet (100 mg total) by mouth daily. (Patient not taking: Reported on 10/03/2022) 30 tablet 0   No current facility-administered medications for this visit.    OBJECTIVE: There were no vitals filed for this visit.    There is no height or weight on file to calculate BMI.    ECOG FS:1 - Symptomatic but completely ambulatory  General: Well-developed, well-nourished, no acute distress. Eyes: Pink conjunctiva, anicteric sclera. HEENT: Normocephalic, moist mucous membranes. Lungs: No audible wheezing or coughing. Heart: Regular rate and rhythm. Abdomen: Soft, nontender, no obvious distention. Musculoskeletal: No edema, cyanosis, or clubbing. Neuro: Alert, answering all questions appropriately. Cranial nerves grossly intact. Skin: No rashes or petechiae noted. Psych: Normal affect.   LAB RESULTS:  Lab Results  Component Value Date   NA 133 (L) 08/25/2022   K 3.5 08/25/2022   CL 101 08/25/2022   CO2 27 08/25/2022   GLUCOSE 117 (H) 08/25/2022   BUN 5 (L) 08/25/2022    CREATININE 0.38 (L) 08/25/2022   CALCIUM 8.3 (L) 08/25/2022   PROT 6.2 (L) 08/25/2022   ALBUMIN 3.0 (L) 08/25/2022   AST 24 08/25/2022   ALT 12 08/25/2022   ALKPHOS 75 08/25/2022   BILITOT 0.4 08/25/2022   GFRNONAA >60 08/25/2022   GFRAA >60 10/03/2016    Lab Results  Component Value Date   WBC 6.3 08/25/2022   NEUTROABS 4.0 08/25/2022   HGB 10.5 (L) 08/25/2022   HCT 30.3 (L) 08/25/2022   MCV 101.3 (H) 08/25/2022   PLT 168 08/25/2022     STUDIES: CT CHEST ABDOMEN PELVIS W CONTRAST  Result Date: 10/12/2022 CLINICAL DATA:  66 year old male with history of rectal cancer status post chemotherapy and radiation therapy. Follow-up study. * Tracking Code: BO * EXAM: CT CHEST, ABDOMEN, AND PELVIS WITH CONTRAST TECHNIQUE: Multidetector CT imaging of the chest, abdomen and pelvis was performed following the standard protocol during bolus administration of intravenous contrast. RADIATION DOSE REDUCTION: This exam was performed according to the departmental dose-optimization program which includes automated exposure control, adjustment of the mA and/or kV according to patient size and/or use of iterative reconstruction technique. CONTRAST:  10m OMNIPAQUE IOHEXOL 300 MG/ML  SOLN COMPARISON:  Multiple prior examinations, including contemporaneously obtained MRI of the pelvis 10/10/2022, MRI of the pelvis 03/03/2022. Chest CT 02/25/2022. CT of the abdomen and pelvis 02/22/2022. FINDINGS: CT CHEST FINDINGS Cardiovascular: Heart size is normal. There is no significant pericardial fluid, thickening or pericardial calcification. There is aortic atherosclerosis, as well as atherosclerosis of the great vessels of the mediastinum and the coronary arteries, including calcified atherosclerotic plaque in the left main, left anterior descending, left circumflex and right coronary arteries. Calcifications of the mitral annulus. Right internal jugular single-lumen Port-A-Cath with tip terminating in the distal  superior vena cava. Mediastinum/Nodes: No pathologically enlarged mediastinal or hilar lymph nodes. Esophagus is unremarkable in appearance. No axillary lymphadenopathy. Lungs/Pleura: No acute consolidative airspace disease. No pleural effusions. No definite suspicious appearing pulmonary nodules or masses are noted. Diffuse bronchial wall thickening with mild centrilobular and paraseptal emphysema. Musculoskeletal: Old healed posterior left-sided rib fractures are again noted. There are no aggressive appearing lytic or blastic lesions noted in the visualized portions of the skeleton. CT ABDOMEN PELVIS FINDINGS Hepatobiliary: Subcentimeter low-attenuation lesion in segment 2 of the liver (axial image 64 of series 2), too small to characterize, but similar to the prior examinations, statistically likely a tiny cyst. No other aggressive appearing cystic or solid hepatic lesions are identified.  No intra or extrahepatic biliary ductal dilatation. Tiny partially calcified gallstone measuring 3 mm lying dependently in the fundus of the gallbladder. Gallbladder is otherwise unremarkable in appearance. Pancreas: No pancreatic mass. No pancreatic ductal dilatation. No pancreatic or peripancreatic fluid collections or inflammatory changes. Spleen: Unremarkable. Adrenals/Urinary Tract: Subcentimeter low-attenuation lesion in the anterior aspect of the lower pole of the left kidney, too small to characterize, but statistically likely tiny cysts (no imaging follow-up recommended). Right kidney and bilateral adrenal glands are otherwise unremarkable in appearance. No hydroureteronephrosis. Urinary bladder wall appears diffusely thickened, without discrete enhancing bladder wall mass confidently identified. Stomach/Bowel: The stomach is nearly decompressed, but otherwise unremarkable in appearance. No pathologic dilatation of small bowel or colon. Diverting colostomy in the left lower quadrant. Normal appendix. Diffuse mural  thickening and luminal narrowing in the terminal ileum (axial image 105 of series 2), new compared to the prior study. Arising from the left side of the rectum (axial image 117 of series 2 and coronal image 82 of series 5) there is a 8.2 x 6.1 x 7.6 cm mass which is predominantly low attenuation with multiple internal septations which enhance. This mass extends outward from the rectal wall exerting mass effect upon adjacent structures, displacing the seminal vesicles anteriorly, making broad contact with the left seminal vesicle and the posterior aspect of the urinary bladder, without definite direct invasion into the urinary bladder wall at this time (although left seminal vesicle invasion is not excluded). This mass extends outward completely obscuring the adjacent mesorectal fat, with extension into the presacral space posteriorly and extension along the posterolateral aspect of the left pelvic sidewall where there is soft tissue thickening and enhancement best appreciated on axial image 121 of series 2. The inferior margin of the mass does not appear to contact the anal sphincter (best appreciated on sagittal images 98 of series 6). Vascular/Lymphatic: Atherosclerosis in the abdominal aorta and pelvic vasculature, without evidence of aneurysm or dissection. Enlarged meso colic lymph node measuring 1.5 cm in short axis (axial image 107 of series 2) with central calcification, likely metastatic lymph node. No other lymphadenopathy confidently identified elsewhere in the abdomen or pelvis. Reproductive: Prostate gland is unremarkable in appearance. Medial aspect of the right seminal vesicle, and the entire posterior aspect of the left seminal vesicle makes broad contact with the previously described rectal mass (see above). Other: No significant volume of ascites.  No pneumoperitoneum. Musculoskeletal: There are no aggressive appearing lytic or blastic lesions noted in the visualized portions of the skeleton.  IMPRESSION: 1. Status post diverting colostomy in the left lower quadrant. Previously noted rectal mass measures 8.2 x 6.1 x 7.6 cm, minimally decreased in size compared to remote prior examinations. Extensive local invasion from this mass is again noted, including direct extension into the presacral space and posterolateral aspect of the left pelvic sidewall as detailed above. There is an enlarged mesorectal lymph node, compatible with locoregional nodal metastasis. No distal nodal metastatic disease or other extranodal metastatic disease noted elsewhere in the chest, abdomen or pelvis. Findings appear to reflect T4 N1 M0 disease. 2. Interval development of extensive mural thickening and luminal narrowing in the terminal ileum concerning for ileitis. 3. Aortic atherosclerosis, in addition to left main and three-vessel coronary artery disease. Please note that although the presence of coronary artery calcium documents the presence of coronary artery disease, the severity of this disease and any potential stenosis cannot be assessed on this non-gated CT examination. Assessment for potential risk factor modification, dietary  therapy or pharmacologic therapy may be warranted, if clinically indicated. 4. Mild diffuse bronchial wall thickening with mild centrilobular and paraseptal emphysema; imaging findings suggestive of underlying COPD. 5. Additional incidental findings, as above. Electronically Signed   By: Vinnie Langton M.D.   On: 10/12/2022 07:21   MR PELVIS WO CONTRAST  Result Date: 10/12/2022 CLINICAL DATA:  66 year old male history of adenocarcinoma of the rectum. EXAM: MRI PELVIS WITHOUT CONTRAST TECHNIQUE: Multiplanar multisequence MR imaging of the pelvis was performed. No intravenous contrast was administered. COMPARISON:  MRI of the pelvis 03/03/2022. FINDINGS: Urinary Tract: Distal ureters are unremarkable. Urinary bladder wall appears diffusely thickened and mildly trabeculated. Bowel: Again noted  is a large mass which extends exophytically from the left side of the rectum (axial image 23 of series 9 and coronal image 26 of series 10) estimated to measure approximately 7.5 x 6.4 x 7.7 cm. This lesion extends well beyond the confines of the rectal wall, outward into the mesorectal fat, extending beyond the mesorectal fascia, with extension posteriorly and laterally into the presacral space and along the posterolateral left pelvic sidewall where a portion of the lesion extends beyond the presacral fascia (axial image 29 of series 9). The inferior margin of the lesion is well above the level of the anorectal junction (approximately 4.4 cm (sagittal image 24 of series 3). Vascular/Lymphatic: Vessels are poorly evaluated on today's noncontrast examination. Enlarged mesorectal lymph node measuring 1.4 cm (image 4 of series 9). Reproductive: Prostate gland is unremarkable in appearance seminal vesicles are slightly displaced anteriorly by the large rectal mass, which is intimately associated with the posterior aspect of both seminal vesicles (left-greater-than-right), better demonstrated on contemporaneously obtained contrast-enhanced CT of the abdomen and pelvis. Other: No significant volume of ascites in the visualized portions of the peritoneal cavity. Left lower quadrant diverting colostomy. Musculoskeletal: Visualized portions are unremarkable. IMPRESSION: 1. Large fungating rectal mass measuring 7.5 x 6.4 x 7.7 cm with extension into the presacral space and posterolateral left pelvic sidewall, with an enlarged mesorectal lymph node, as detailed above. Findings are indicative of T4 N1 disease. The inferior aspect of the lesion is well above the anorectal junction. Electronically Signed   By: Vinnie Langton M.D.   On: 10/12/2022 07:18    ASSESSMENT: Stage IIa adenocarcinoma of the rectum.  PLAN:    Stage IIa adenocarcinoma of the rectum: Recent imaging suggest progressive disease despite receiving  neoadjuvant chemotherapy with FOLFOX as well as neoadjuvant concurrent capecitabine and XRT.  Surgical consult x 2 most recently at [facility] also concurrent that patient is no longer resectable.  He has declined any further chemotherapy, but would be interested in directed therapy.  His molecular studies were drawn at [facility] and are pending at time of dictation.  Patient is not interested in hospice at this time, but may require services in the future.  No further intervention is needed.  Return to clinic in 1 month for further evaluation.  Appreciate palliative care input. Pain: Significantly worse.  Increase fentanyl patch to 50 mcg every 72 hours and continue oxycodone 10 mg as needed.   Anemia: Chronic and unchanged. Thrombocytopenia: Resolved. Hyponatremia: Chronic and unchanged.   Mouth pain/poor dentition: Patient does not complain of this today.  Patient was previously given the number of the local dentists to call.     Patient expressed understanding and was in agreement with this plan. He also understands that He can call clinic at any time with any questions, concerns, or complaints.  Cancer Staging  Adenocarcinoma of rectum [facility]) Staging form: Colon and Rectum, AJCC 8th Edition - Clinical stage from 03/10/2022: Stage IIA (cT3, cN0, cM0) - Signed by Lloyd Huger, MD on 03/10/2022 Stage prefix: Initial diagnosis Total positive nodes: 0  Lloyd Huger, MD   11/06/2022 7:11 PM

## 2022-11-06 NOTE — Progress Notes (Signed)
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 Hands and feet stay cold. His cancer is inoperable. L upper abd around stoma ( gas pains, cramping) Stool passing thru ostomy and mucus from his rectum. Appetite is fine. No energy, stays tired.

## 2022-11-07 DIAGNOSIS — C20 Malignant neoplasm of rectum: Secondary | ICD-10-CM | POA: Diagnosis not present

## 2022-11-07 NOTE — Progress Notes (Signed)
[place] at [facility] [phone] [phone]  Patient Care Team: Kathrine Haddock, NP as PCP - General (Nurse Practitioner) Jules Husbands, MD as Consulting Physician (General Surgery) Clent Jacks, RN as Oncology Nurse Navigator Grayland Ormond, Kathlene November, MD as Consulting Physician (Oncology) [name], MD as Consulting Physician (Cardiology) Michael Boston, MD as Consulting Physician (Colon and Rectal Surgery) Lesly Rubenstein, MD as Consulting Physician (Gastroenterology) Billey Co, MD as Consulting Physician (Urology)   NAME OF PATIENT: Kyle Larson  [id number]  22-Mar-1956   DATE OF VISIT: 11/07/22  REASON FOR CONSULT: Kyle Larson is a 66 y.o. 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  Patient had disease progression.  He was referred to palliative care to address goals.  SOCIAL HISTORY: Patient lives at home with his wife and 3 children ages 71-14.  Patient previously drove a truck.  CODE STATUS: DNR (DNR order signed on 11/06/22)   PAST MEDICAL HISTORY: Past Medical History:  Diagnosis Date   AC joint dislocation    CAD (coronary artery disease)    CVA (cerebral vascular accident) ([place])    Metatarsal bone fracture    3rd metatarsal , left foot; April 2021   Syncope    June '22   Tobacco abuse     PAST SURGICAL HISTORY:  Past Surgical History:  Procedure Laterality Date   ACROMIO-CLAVICULAR JOINT REPAIR Left 09/11/2016   Procedure: ACROMIO-CLAVICULAR RECONSTRUCTION;  Surgeon: Corky Mull, MD;  Location: [facility];  Service: Orthopedics;  Laterality: Left;  clavicle   FLEXIBLE SIGMOIDOSCOPY N/A 02/24/2022   Procedure: FLEXIBLE SIGMOIDOSCOPY;  Surgeon: Lesly Rubenstein, MD;  Location: [facility];  Service: Endoscopy;   Laterality: N/A;   IR IMAGING GUIDED PORT INSERTION  03/10/2022   LEFT HEART CATH AND CORONARY ANGIOGRAPHY N/A 05/20/2021   Procedure: LEFT HEART CATH AND CORONARY ANGIOGRAPHY with coronary intervention;  Surgeon: [name], MD;  Location: [facility];  Service: Cardiovascular;  Laterality: N/A;   MANDIBLE RECONSTRUCTION     TONSILLECTOMY     AGE 30   XI ROBOTIC ASSISTED INGUINAL HERNIA REPAIR WITH MESH Right 03/26/2020   Procedure: XI ROBOTIC ASSISTED INGUINAL HERNIA REPAIR WITH MESH;  Surgeon: Herbert Pun, MD;  Location: [facility] ORS;  Service: General;  Laterality: Right;    HEMATOLOGY/ONCOLOGY HISTORY:  Oncology History  Adenocarcinoma of rectum ([place])  02/25/2022 Initial Diagnosis   Adenocarcinoma of rectum ([place])   03/10/2022 Cancer Staging   Staging form: Colon and Rectum, AJCC 8th Edition - Clinical stage from 03/10/2022: Stage IIA (cT3, cN0, cM0) - Signed by Lloyd Huger, MD on 03/10/2022 Stage prefix: Initial diagnosis Total positive nodes: 0   03/12/2022 - 07/11/2022 Chemotherapy   Patient is on Treatment Plan : COLORECTAL FOLFOX q14d x 4 months       ALLERGIES:  has No Known Allergies.  MEDICATIONS:  Current Outpatient Medications  Medication Sig Dispense Refill   dexamethasone (DECADRON) 2 MG tablet Take 1 tablet (2 mg total) by mouth daily. 30 tablet 1   acetaminophen (TYLENOL) 325 MG tablet Take 2 tablets (650 mg total) by mouth every 6 (six) hours as needed for mild pain.     ALPRAZolam (XANAX) 0.25 MG tablet Take 1 tablet (0.25 mg total) by mouth at bedtime as needed for sleep. 30 tablet 0   atorvastatin (LIPITOR) 40 MG tablet Take  1 tablet (40 mg total) by mouth daily. 30 tablet 0   feeding supplement (ENSURE ENLIVE / ENSURE PLUS) LIQD Take 237 mLs by mouth 3 (three) times daily between meals. 21330 mL 0   fentaNYL (DURAGESIC) 50 MCG/HR Place 1 patch onto the skin every 3 (three) days. 10 patch 0   lidocaine-prilocaine (EMLA) cream Apply to  affected area once 30 g 3   lidocaine-prilocaine (EMLA) cream Apply 1 application  topically as needed. Apply cream to port 1 hour prior to chemotherapy. Cover with plastic wrap. 30 g 3   loperamide (IMODIUM A-D) 2 MG tablet Take 2 mg by mouth 4 (four) times daily as needed for diarrhea or loose stools. (Patient not taking: Reported on 11/06/2022)     Multiple Vitamin (MULTI-VITAMIN) tablet Take by mouth.     Multiple Vitamins-Minerals (MULTIVITAMIN WITH MINERALS) tablet Take 1 tablet by mouth daily.     nicotine (NICODERM CQ - DOSED IN MG/24 HOURS) 21 mg/24hr patch Place 21 mg onto the skin daily. (Patient not taking: Reported on 11/06/2022)     ondansetron (ZOFRAN) 8 MG tablet TAKE 1 TABLET 2 TIMES DAILY AS NEEDED FOR REFRACTORY NAUSEA / VOMITING. START DAY 3 AFTER CHEMO (Patient not taking: Reported on 10/03/2022) 60 tablet 2   Oxycodone HCl 10 MG TABS Take 1 tablet (10 mg total) by mouth every 6 (six) hours as needed. Okay to take 1/2 tablet as needed. 90 tablet 0   pantoprazole (PROTONIX) 40 MG tablet Take 1 tablet (40 mg total) by mouth daily. 30 tablet 2   polyethylene glycol (MIRALAX / GLYCOLAX) 17 g packet Take 17 g by mouth daily. (Patient not taking: Reported on 10/03/2022)     prochlorperazine (COMPAZINE) 10 MG tablet Take 1 tablet (10 mg total) by mouth every 6 (six) hours as needed (Nausea or vomiting). (Patient not taking: Reported on 10/03/2022) 60 tablet 2   sildenafil (VIAGRA) 25 MG tablet Take 1 tablet (25 mg total) by mouth daily as needed for erectile dysfunction. 10 tablet 0   sotalol (BETAPACE) 80 MG tablet Take 80 mg by mouth daily.     tamsulosin (FLOMAX) 0.4 MG CAPS capsule Take 2 capsules (0.8 mg total) by mouth daily. Take one in the morning and one at night. 60 capsule 11   thiamine 100 MG tablet Take 1 tablet (100 mg total) by mouth daily. (Patient not taking: Reported on 10/03/2022) 30 tablet 0   No current facility-administered medications for this visit.    VITAL  SIGNS: There were no vitals taken for this visit. There were no vitals filed for this visit.   Estimated body mass index is 17.83 kg/m as calculated from the following:   Height as of 10/22/22: 6' (1.829 m).   Weight as of 10/22/22: 131 lb 8 oz (59.6 kg).  LABS: CBC:    Component Value Date/Time   WBC 6.3 08/25/2022 1337   HGB 10.5 (L) 08/25/2022 1337   HCT 30.3 (L) 08/25/2022 1337   PLT 168 08/25/2022 1337   MCV 101.3 (H) 08/25/2022 1337   NEUTROABS 4.0 08/25/2022 1337   LYMPHSABS 0.7 08/25/2022 1337   MONOABS 0.9 08/25/2022 1337   EOSABS 0.7 (H) 08/25/2022 1337   BASOSABS 0.0 08/25/2022 1337   Comprehensive Metabolic Panel:    Component Value Date/Time   NA 133 (L) 08/25/2022 1337   K 3.5 08/25/2022 1337   CL 101 08/25/2022 1337   CO2 27 08/25/2022 1337   BUN 5 (L) 08/25/2022 1337  CREATININE 0.38 (L) 08/25/2022 1337   GLUCOSE 117 (H) 08/25/2022 1337   CALCIUM 8.3 (L) 08/25/2022 1337   AST 24 08/25/2022 1337   ALT 12 08/25/2022 1337   ALKPHOS 75 08/25/2022 1337   BILITOT 0.4 08/25/2022 1337   PROT 6.2 (L) 08/25/2022 1337   ALBUMIN 3.0 (L) 08/25/2022 1337    RADIOGRAPHIC STUDIES: CT CHEST ABDOMEN PELVIS W CONTRAST  Result Date: 10/12/2022 CLINICAL DATA:  66 year old male with history of rectal cancer status post chemotherapy and radiation therapy. Follow-up study. * Tracking Code: BO * EXAM: CT CHEST, ABDOMEN, AND PELVIS WITH CONTRAST TECHNIQUE: Multidetector CT imaging of the chest, abdomen and pelvis was performed following the standard protocol during bolus administration of intravenous contrast. RADIATION DOSE REDUCTION: This exam was performed according to the departmental dose-optimization program which includes automated exposure control, adjustment of the mA and/or kV according to patient size and/or use of iterative reconstruction technique. CONTRAST:  58m OMNIPAQUE IOHEXOL 300 MG/ML  SOLN COMPARISON:  Multiple prior examinations, including contemporaneously  obtained MRI of the pelvis 10/10/2022, MRI of the pelvis 03/03/2022. Chest CT 02/25/2022. CT of the abdomen and pelvis 02/22/2022. FINDINGS: CT CHEST FINDINGS Cardiovascular: Heart size is normal. There is no significant pericardial fluid, thickening or pericardial calcification. There is aortic atherosclerosis, as well as atherosclerosis of the great vessels of the mediastinum and the coronary arteries, including calcified atherosclerotic plaque in the left main, left anterior descending, left circumflex and right coronary arteries. Calcifications of the mitral annulus. Right internal jugular single-lumen Port-A-Cath with tip terminating in the distal superior vena cava. Mediastinum/Nodes: No pathologically enlarged mediastinal or hilar lymph nodes. Esophagus is unremarkable in appearance. No axillary lymphadenopathy. Lungs/Pleura: No acute consolidative airspace disease. No pleural effusions. No definite suspicious appearing pulmonary nodules or masses are noted. Diffuse bronchial wall thickening with mild centrilobular and paraseptal emphysema. Musculoskeletal: Old healed posterior left-sided rib fractures are again noted. There are no aggressive appearing lytic or blastic lesions noted in the visualized portions of the skeleton. CT ABDOMEN PELVIS FINDINGS Hepatobiliary: Subcentimeter low-attenuation lesion in segment 2 of the liver (axial image 64 of series 2), too small to characterize, but similar to the prior examinations, statistically likely a tiny cyst. No other aggressive appearing cystic or solid hepatic lesions are identified. No intra or extrahepatic biliary ductal dilatation. Tiny partially calcified gallstone measuring 3 mm lying dependently in the fundus of the gallbladder. Gallbladder is otherwise unremarkable in appearance. Pancreas: No pancreatic mass. No pancreatic ductal dilatation. No pancreatic or peripancreatic fluid collections or inflammatory changes. Spleen: Unremarkable. Adrenals/Urinary  Tract: Subcentimeter low-attenuation lesion in the anterior aspect of the lower pole of the left kidney, too small to characterize, but statistically likely tiny cysts (no imaging follow-up recommended). Right kidney and bilateral adrenal glands are otherwise unremarkable in appearance. No hydroureteronephrosis. Urinary bladder wall appears diffusely thickened, without discrete enhancing bladder wall mass confidently identified. Stomach/Bowel: The stomach is nearly decompressed, but otherwise unremarkable in appearance. No pathologic dilatation of small bowel or colon. Diverting colostomy in the left lower quadrant. Normal appendix. Diffuse mural thickening and luminal narrowing in the terminal ileum (axial image 105 of series 2), new compared to the prior study. Arising from the left side of the rectum (axial image 117 of series 2 and coronal image 82 of series 5) there is a 8.2 x 6.1 x 7.6 cm mass which is predominantly low attenuation with multiple internal septations which enhance. This mass extends outward from the rectal wall exerting mass effect upon adjacent  structures, displacing the seminal vesicles anteriorly, making broad contact with the left seminal vesicle and the posterior aspect of the urinary bladder, without definite direct invasion into the urinary bladder wall at this time (although left seminal vesicle invasion is not excluded). This mass extends outward completely obscuring the adjacent mesorectal fat, with extension into the presacral space posteriorly and extension along the posterolateral aspect of the left pelvic sidewall where there is soft tissue thickening and enhancement best appreciated on axial image 121 of series 2. The inferior margin of the mass does not appear to contact the anal sphincter (best appreciated on sagittal images 98 of series 6). Vascular/Lymphatic: Atherosclerosis in the abdominal aorta and pelvic vasculature, without evidence of aneurysm or dissection. Enlarged  meso colic lymph node measuring 1.5 cm in short axis (axial image 107 of series 2) with central calcification, likely metastatic lymph node. No other lymphadenopathy confidently identified elsewhere in the abdomen or pelvis. Reproductive: Prostate gland is unremarkable in appearance. Medial aspect of the right seminal vesicle, and the entire posterior aspect of the left seminal vesicle makes broad contact with the previously described rectal mass (see above). Other: No significant volume of ascites.  No pneumoperitoneum. Musculoskeletal: There are no aggressive appearing lytic or blastic lesions noted in the visualized portions of the skeleton. IMPRESSION: 1. Status post diverting colostomy in the left lower quadrant. Previously noted rectal mass measures 8.2 x 6.1 x 7.6 cm, minimally decreased in size compared to remote prior examinations. Extensive local invasion from this mass is again noted, including direct extension into the presacral space and posterolateral aspect of the left pelvic sidewall as detailed above. There is an enlarged mesorectal lymph node, compatible with locoregional nodal metastasis. No distal nodal metastatic disease or other extranodal metastatic disease noted elsewhere in the chest, abdomen or pelvis. Findings appear to reflect T4 N1 M0 disease. 2. Interval development of extensive mural thickening and luminal narrowing in the terminal ileum concerning for ileitis. 3. Aortic atherosclerosis, in addition to left main and three-vessel coronary artery disease. Please note that although the presence of coronary artery calcium documents the presence of coronary artery disease, the severity of this disease and any potential stenosis cannot be assessed on this non-gated CT examination. Assessment for potential risk factor modification, dietary therapy or pharmacologic therapy may be warranted, if clinically indicated. 4. Mild diffuse bronchial wall thickening with mild centrilobular and  paraseptal emphysema; imaging findings suggestive of underlying COPD. 5. Additional incidental findings, as above. Electronically Signed   By: Vinnie Langton M.D.   On: 10/12/2022 07:21   MR PELVIS WO CONTRAST  Result Date: 10/12/2022 CLINICAL DATA:  66 year old male history of adenocarcinoma of the rectum. EXAM: MRI PELVIS WITHOUT CONTRAST TECHNIQUE: Multiplanar multisequence MR imaging of the pelvis was performed. No intravenous contrast was administered. COMPARISON:  MRI of the pelvis 03/03/2022. FINDINGS: Urinary Tract: Distal ureters are unremarkable. Urinary bladder wall appears diffusely thickened and mildly trabeculated. Bowel: Again noted is a large mass which extends exophytically from the left side of the rectum (axial image 23 of series 9 and coronal image 26 of series 10) estimated to measure approximately 7.5 x 6.4 x 7.7 cm. This lesion extends well beyond the confines of the rectal wall, outward into the mesorectal fat, extending beyond the mesorectal fascia, with extension posteriorly and laterally into the presacral space and along the posterolateral left pelvic sidewall where a portion of the lesion extends beyond the presacral fascia (axial image 29 of series 9). The inferior margin  of the lesion is well above the level of the anorectal junction (approximately 4.4 cm (sagittal image 24 of series 3). Vascular/Lymphatic: Vessels are poorly evaluated on today's noncontrast examination. Enlarged mesorectal lymph node measuring 1.4 cm (image 4 of series 9). Reproductive: Prostate gland is unremarkable in appearance seminal vesicles are slightly displaced anteriorly by the large rectal mass, which is intimately associated with the posterior aspect of both seminal vesicles (left-greater-than-right), better demonstrated on contemporaneously obtained contrast-enhanced CT of the abdomen and pelvis. Other: No significant volume of ascites in the visualized portions of the peritoneal cavity. Left lower  quadrant diverting colostomy. Musculoskeletal: Visualized portions are unremarkable. IMPRESSION: 1. Large fungating rectal mass measuring 7.5 x 6.4 x 7.7 cm with extension into the presacral space and posterolateral left pelvic sidewall, with an enlarged mesorectal lymph node, as detailed above. Findings are indicative of T4 N1 disease. The inferior aspect of the lesion is well above the anorectal junction. Electronically Signed   By: Vinnie Langton M.D.   On: 10/12/2022 07:18    PERFORMANCE STATUS (ECOG) : 1 - Symptomatic but completely ambulatory  Review of Systems Unless otherwise noted, a complete review of systems is negative.  Physical Exam General: NAD Cardiovascular: regular rate and rhythm Pulmonary: clear ant fields Abdomen: Ostomy noted but not visualized GU: no suprapubic tenderness Extremities: no edema, no joint deformities Skin: no rashes Neurological: Weakness but otherwise nonfocal  IMPRESSION: Patient was an add-on to my clinic schedule today at Dr. Gary Fleet request.  Unfortunately, patient has had recent CT showing extensive local invasion from the mass without significant improvement from chemotherapy.  He has been evaluated at both [facility] in [place] for concerns of progressive disease and was felt that cancer is nonresectable.  Patient has declined further chemotherapy.  I spoke with patient and wife regarding goals.  Patient spoke candidly about his expectation that the cancer will be the cause of his ultimate demise.  At this point, he feels like his quality of life is decent with the exception of significant fatigue.  He is still able to engage in some activities such as working on his cars but says that he just feels tired all of the time.  He says that he has a "million things to do" before his end-of-life.  He did request medications for fatigue and says that he is previously felt improved on steroids.  Agreed to trial a short course of dexamethasone.  We  discussed future involvement of hospice.  Patient feels like he is not ready for that at this time but would prefer to die at home when it is his end-of-life.  We discussed CODE STATUS.  Patient stated clearly and repeatedly that he would not want to be resuscitated nor have his life prolonged artificially on machines.  He was in agreement with DNR/DNI.  I signed a DNR order for him to take home today.  PLAN: -Best supportive care -Trial of dexamethasone 2 mg daily -Recommend hospice when patient is in agreement -DNR/DNI -Follow-up visit in 1 month   Thank you for allowing me to participate in the care of this very pleasant patient.   Time Total: 20 minutes  Visit consisted of counseling and education dealing with the complex and emotionally intense issues of symptom management in the setting of serious illness.Greater than 50%  of this time was spent counseling and coordinating care related to the above assessment and plan.  Signed by: Altha Harm, PhD, NP-C

## 2022-11-10 ENCOUNTER — Inpatient Hospital Stay: Payer: Medicare Other | Admitting: Hospice and Palliative Medicine

## 2022-11-10 ENCOUNTER — Encounter: Payer: Self-pay | Admitting: Oncology

## 2022-11-11 ENCOUNTER — Ambulatory Visit: Payer: Medicare Other | Admitting: Urology

## 2022-11-18 ENCOUNTER — Telehealth: Payer: Self-pay | Admitting: *Deleted

## 2022-11-18 NOTE — Telephone Encounter (Signed)
Patient called asking for an appointment with Dr Grayland Ormond as he wants to change his DNR  ASAP

## 2022-11-19 ENCOUNTER — Ambulatory Visit: Payer: Medicare Other | Admitting: Urology

## 2022-11-19 ENCOUNTER — Inpatient Hospital Stay: Payer: Medicare Other | Admitting: Hospice and Palliative Medicine

## 2022-11-19 NOTE — Progress Notes (Signed)
Guardant 360 results send to HIM for scanning.

## 2022-11-20 ENCOUNTER — Encounter: Payer: Self-pay | Admitting: Oncology

## 2022-11-26 ENCOUNTER — Other Ambulatory Visit: Payer: Self-pay

## 2022-11-26 ENCOUNTER — Encounter: Payer: Self-pay | Admitting: Hospice and Palliative Medicine

## 2022-11-26 ENCOUNTER — Inpatient Hospital Stay: Payer: Medicare Other | Attending: Oncology | Admitting: Hospice and Palliative Medicine

## 2022-11-26 VITALS — BP 127/78 | HR 71 | Temp 96.7°F | Resp 18 | Ht 72.0 in | Wt 128.0 lb

## 2022-11-26 DIAGNOSIS — F1090 Alcohol use, unspecified, uncomplicated: Secondary | ICD-10-CM | POA: Diagnosis not present

## 2022-11-26 DIAGNOSIS — Z933 Colostomy status: Secondary | ICD-10-CM | POA: Diagnosis not present

## 2022-11-26 DIAGNOSIS — Z720 Tobacco use: Secondary | ICD-10-CM | POA: Diagnosis not present

## 2022-11-26 DIAGNOSIS — D649 Anemia, unspecified: Secondary | ICD-10-CM | POA: Insufficient documentation

## 2022-11-26 DIAGNOSIS — Z79899 Other long term (current) drug therapy: Secondary | ICD-10-CM | POA: Insufficient documentation

## 2022-11-26 DIAGNOSIS — Z5111 Encounter for antineoplastic chemotherapy: Secondary | ICD-10-CM | POA: Diagnosis not present

## 2022-11-26 DIAGNOSIS — E871 Hypo-osmolality and hyponatremia: Secondary | ICD-10-CM | POA: Diagnosis not present

## 2022-11-26 DIAGNOSIS — C20 Malignant neoplasm of rectum: Secondary | ICD-10-CM | POA: Diagnosis not present

## 2022-11-26 DIAGNOSIS — G893 Neoplasm related pain (acute) (chronic): Secondary | ICD-10-CM | POA: Diagnosis not present

## 2022-11-26 MED ORDER — PANTOPRAZOLE SODIUM 40 MG PO TBEC: 40.0000 mg | DELAYED_RELEASE_TABLET | Freq: Every day | ORAL | 2 refills | Status: DC

## 2022-11-26 MED ORDER — GABAPENTIN 100 MG PO CAPS: 100.0000 mg | ORAL_CAPSULE | Freq: Three times a day (TID) | ORAL | 0 refills | Status: DC

## 2022-11-26 MED ORDER — FENTANYL 50 MCG/HR TD PT72: 1.0000 | MEDICATED_PATCH | TRANSDERMAL | 0 refills | Status: DC

## 2022-11-26 MED ORDER — OXYCODONE HCL 10 MG PO TABS: 10.0000 mg | ORAL_TABLET | Freq: Four times a day (QID) | ORAL | 0 refills | Status: DC | PRN
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
## 2022-11-27 ENCOUNTER — Telehealth: Payer: Self-pay

## 2022-11-27 ENCOUNTER — Telehealth: Payer: Self-pay | Admitting: *Deleted

## 2022-11-27 DIAGNOSIS — Z933 Colostomy status: Secondary | ICD-10-CM | POA: Diagnosis not present

## 2022-11-27 NOTE — Telephone Encounter (Signed)
Faxed written order to [facility] for colostomy supplies.

## 2022-11-28 NOTE — Telephone Encounter (Signed)
Received notification from pharmacy that pt needs RF on xanax. At last visit, pt wasn't using this script. Sent msg to patient to inquire if he needed this RF.

## 2022-12-01 ENCOUNTER — Other Ambulatory Visit: Payer: Self-pay | Admitting: *Deleted

## 2022-12-01 MED ORDER — ALPRAZOLAM 0.25 MG PO TABS: 0.2500 mg | ORAL_TABLET | Freq: Every evening | ORAL | 0 refills | Status: DC | PRN

## 2022-12-01 NOTE — Telephone Encounter (Signed)
Patient needs RF on xanax

## 2022-12-05 ENCOUNTER — Encounter: Payer: Self-pay | Admitting: Oncology

## 2022-12-05 ENCOUNTER — Encounter: Payer: Medicare Other | Admitting: Hospice and Palliative Medicine

## 2022-12-05 ENCOUNTER — Inpatient Hospital Stay (HOSPITAL_BASED_OUTPATIENT_CLINIC_OR_DEPARTMENT_OTHER): Payer: Medicare Other | Admitting: Oncology

## 2022-12-05 VITALS — BP 122/78 | HR 86 | Temp 97.8°F | Resp 18 | Ht 72.0 in | Wt 136.0 lb

## 2022-12-05 DIAGNOSIS — Z933 Colostomy status: Secondary | ICD-10-CM | POA: Diagnosis not present

## 2022-12-05 DIAGNOSIS — Z79899 Other long term (current) drug therapy: Secondary | ICD-10-CM | POA: Diagnosis not present

## 2022-12-05 DIAGNOSIS — C20 Malignant neoplasm of rectum: Secondary | ICD-10-CM

## 2022-12-05 DIAGNOSIS — E871 Hypo-osmolality and hyponatremia: Secondary | ICD-10-CM | POA: Diagnosis not present

## 2022-12-05 DIAGNOSIS — F1090 Alcohol use, unspecified, uncomplicated: Secondary | ICD-10-CM | POA: Diagnosis not present

## 2022-12-05 DIAGNOSIS — Z5111 Encounter for antineoplastic chemotherapy: Secondary | ICD-10-CM | POA: Diagnosis not present

## 2022-12-05 DIAGNOSIS — Z720 Tobacco use: Secondary | ICD-10-CM | POA: Diagnosis not present

## 2022-12-05 DIAGNOSIS — D649 Anemia, unspecified: Secondary | ICD-10-CM | POA: Diagnosis not present

## 2022-12-05 MED ORDER — LOPERAMIDE HCL 2 MG PO CAPS: ORAL_CAPSULE | ORAL | 2 refills | Status: DC

## 2022-12-05 MED ORDER — DEXAMETHASONE 2 MG PO TABS: 2.0000 mg | ORAL_TABLET | Freq: Two times a day (BID) | ORAL | 2 refills | Status: DC | PRN

## 2022-12-05 MED ORDER — LIDOCAINE-PRILOCAINE 2.5-2.5 % EX CREA: TOPICAL_CREAM | CUTANEOUS | 3 refills | Status: DC

## 2022-12-05 NOTE — Progress Notes (Signed)
[place]  Telephone:(336) [phone] [phone]) [phone]  ID: Kyle Larson OB: 1956-07-16  MR#: [id number]  NLZ#:[id number]  Patient Care Team: Kathrine Haddock, NP as PCP - General (Nurse Practitioner) Jules Husbands, MD as Consulting Physician (General Surgery) Clent Jacks, RN as Oncology Nurse Navigator Grayland Ormond, Kathlene November, MD as Consulting Physician (Oncology) [name], MD as Consulting Physician (Cardiology) Michael Boston, MD as Consulting Physician (Colon and Rectal Surgery) Lesly Rubenstein, MD as Consulting Physician (Gastroenterology) Billey Co, MD as Consulting Physician (Urology)  CHIEF COMPLAINT: Progressive adenocarcinoma of the rectum.  INTERVAL HISTORY: Patient returns to clinic today for further evaluation and discussion of reinitiating chemotherapy.  Initially he did not wish to pursue palliative treatment, but has since changed his mind.  He currently feels well.  His pain is well-controlled.  He has a good appetite and is gaining weight.  He denies any weakness or fatigue. He has no neurologic complaints.  He denies any recent fevers or illnesses.  He has no chest pain, shortness of breath, cough, or hemoptysis.  He has no nausea, vomiting, or constipation.  He does not report any melena or hematochezia.  He has no urinary complaints.  Patient offers no specific complaints today.  REVIEW OF SYSTEMS:   Review of Systems  Constitutional: Negative.  Negative for fever, malaise/fatigue and weight loss.  Respiratory: Negative.  Negative for cough, hemoptysis and shortness of breath.   Cardiovascular: Negative.  Negative for chest pain and leg swelling.  Gastrointestinal: Negative.  Negative for abdominal pain, blood in stool, constipation, diarrhea and melena.  Genitourinary: Negative.  Negative for dysuria.  Musculoskeletal: Negative.  Negative for back pain.  Skin: Negative.  Negative for rash.  Neurological: Negative.  Negative  for dizziness, speech change, focal weakness, weakness and headaches.  Psychiatric/Behavioral: Negative.  The patient is not nervous/anxious and does not have insomnia.     As per HPI. Otherwise, a complete review of systems is negative.  PAST MEDICAL HISTORY: Past Medical History:  Diagnosis Date   AC joint dislocation    CAD (coronary artery disease)    CVA (cerebral vascular accident) ([place])    Metatarsal bone fracture    3rd metatarsal , left foot; April 2021   Syncope    June '22   Tobacco abuse     PAST SURGICAL HISTORY: Past Surgical History:  Procedure Laterality Date   ACROMIO-CLAVICULAR JOINT REPAIR Left 09/11/2016   Procedure: ACROMIO-CLAVICULAR RECONSTRUCTION;  Surgeon: Corky Mull, MD;  Location: [facility] OR[facility];  Service: Orthopedics;  Laterality: Left;  clavicle   FLEXIBLE SIGMOIDOSCOPY N/A 02/24/2022   Procedure: FLEXIBLE SIGMOIDOSCOPY;  Surgeon: Lesly Rubenstein, MD;  Location: [facility] END[facility];  Service: Endoscopy;  Laterality: N/A;   IR IMAGING GUIDED PORT INSERTION  03/10/2022   LEFT HEART CATH AND CORONARY ANGIOGRAPHY N/A 05/20/2021   Procedure: LEFT HEART CATH AND CORONARY ANGIOGRAPHY with coronary intervention;  Surgeon: [name], MD;  Location: [place] [facility];  Service: Cardiovascular;  Laterality: N/A;   MANDIBLE RECONSTRUCTION     TONSILLECTOMY     AGE 67   XI ROBOTIC ASSISTED INGUINAL HERNIA REPAIR WITH MESH Right 03/26/2020   Procedure: XI ROBOTIC ASSISTED INGUINAL HERNIA REPAIR WITH MESH;  Surgeon: Herbert Pun, MD;  Location: [facility] ORS;  Service: General;  Laterality: Right;    FAMILY HISTORY: Family History  Problem Relation Age of Onset   Diabetes Mother    Prostate cancer Father     ADVANCED DIRECTIVES (Y/N):  N  HEALTH MAINTENANCE: Social History   Tobacco Use   Smoking status: Some Days    Packs/day: 0.25    Years: 15.00    Total pack years: 3.75    Types: Cigarettes    Passive exposure: Current   Smokeless tobacco:  Never  Vaping Use   Vaping Use: Never used  Substance Use Topics   Alcohol use: Yes    Alcohol/week: 42.0 standard drinks of alcohol    Types: 42 Cans of beer per week    Comment: 3 every other day   Drug use: No     Colonoscopy:  PAP:  Bone density:  Lipid panel:  No Known Allergies  Current Outpatient Medications  Medication Sig Dispense Refill   ALPRAZolam (XANAX) 0.25 MG tablet Take 1 tablet (0.25 mg total) by mouth at bedtime as needed for sleep. 30 tablet 0   atorvastatin (LIPITOR) 40 MG tablet Take 1 tablet (40 mg total) by mouth daily. 30 tablet 0   fentaNYL (DURAGESIC) 50 MCG/HR Place 1 patch onto the skin every 3 (three) days. 10 patch 0   gabapentin (NEURONTIN) 100 MG capsule Take 1 capsule (100 mg total) by mouth 3 (three) times daily. 90 capsule 0   Multiple Vitamin (MULTI-VITAMIN) tablet Take by mouth.     Multiple Vitamins-Minerals (MULTIVITAMIN WITH MINERALS) tablet Take 1 tablet by mouth daily.     Oxycodone HCl 10 MG TABS Take 1 tablet (10 mg total) by mouth every 6 (six) hours as needed. Okay to take 1/2 tablet as needed. 90 tablet 0   pantoprazole (PROTONIX) 40 MG tablet Take 1 tablet (40 mg total) by mouth daily. 30 tablet 2   sildenafil (VIAGRA) 25 MG tablet Take 1 tablet (25 mg total) by mouth daily as needed for erectile dysfunction. 10 tablet 0   sotalol (BETAPACE) 80 MG tablet Take 80 mg by mouth daily.     tamsulosin (FLOMAX) 0.4 MG CAPS capsule Take 2 capsules (0.8 mg total) by mouth daily. Take one in the morning and one at night. 60 capsule 11   acetaminophen (TYLENOL) 325 MG tablet Take 2 tablets (650 mg total) by mouth every 6 (six) hours as needed for mild pain. (Patient not taking: Reported on 11/26/2022)     dexamethasone (DECADRON) 2 MG tablet Take 1 tablet (2 mg total) by mouth 2 (two) times daily as needed. for appetite and nausea. 60 tablet 2   feeding supplement (ENSURE ENLIVE / ENSURE PLUS) LIQD Take 237 mLs by mouth 3 (three) times daily  between meals. (Patient not taking: Reported on 12/05/2022) 21330 mL 0   lidocaine-prilocaine (EMLA) cream Apply 1 application  topically as needed. Apply cream to port 1 hour prior to chemotherapy. Cover with plastic wrap. (Patient not taking: Reported on 11/26/2022) 30 g 3   lidocaine-prilocaine (EMLA) cream Apply to affected area once 30 g 3   loperamide (IMODIUM A-D) 2 MG tablet Take 2 mg by mouth 4 (four) times daily as needed for diarrhea or loose stools. (Patient not taking: Reported on 11/06/2022)     loperamide (IMODIUM) 2 MG capsule Take 2 tabs by mouth with first loose stool, then 1 tab with each additional loose stool as needed. Do not exceed 8 tabs in a 24-hour period 60 capsule 2   nicotine (NICODERM CQ - DOSED IN MG/24 HOURS) 21 mg/24hr patch Place 21 mg onto the skin daily. (Patient not taking: Reported on 11/06/2022)     ondansetron (ZOFRAN) 8 MG tablet TAKE 1  TABLET 2 TIMES DAILY AS NEEDED FOR REFRACTORY NAUSEA / VOMITING. START DAY 3 AFTER CHEMO (Patient not taking: Reported on 10/03/2022) 60 tablet 2   polyethylene glycol (MIRALAX / GLYCOLAX) 17 g packet Take 17 g by mouth daily. (Patient not taking: Reported on 12/05/2022)     prochlorperazine (COMPAZINE) 10 MG tablet Take 1 tablet (10 mg total) by mouth every 6 (six) hours as needed (Nausea or vomiting). (Patient not taking: Reported on 10/03/2022) 60 tablet 2   thiamine 100 MG tablet Take 1 tablet (100 mg total) by mouth daily. (Patient not taking: Reported on 10/03/2022) 30 tablet 0   No current facility-administered medications for this visit.    OBJECTIVE: Vitals:   12/05/22 1017  BP: 122/78  Pulse: 86  Resp: 18  Temp: 97.8 F (36.6 C)  SpO2: 100%      Body mass index is 18.44 kg/m.    ECOG FS:1 - Symptomatic but completely ambulatory  General: Well-developed, well-nourished, no acute distress. Eyes: Pink conjunctiva, anicteric sclera. HEENT: Normocephalic, moist mucous membranes. Lungs: No audible wheezing or  coughing. Heart: Regular rate and rhythm. Abdomen: Soft, nontender, no obvious distention. Musculoskeletal: No edema, cyanosis, or clubbing. Neuro: Alert, answering all questions appropriately. Cranial nerves grossly intact. Skin: No rashes or petechiae noted. Psych: Normal affect.  LAB RESULTS:  Lab Results  Component Value Date   NA 133 (L) 08/25/2022   K 3.5 08/25/2022   CL 101 08/25/2022   CO2 27 08/25/2022   GLUCOSE 117 (H) 08/25/2022   BUN 5 (L) 08/25/2022   CREATININE 0.38 (L) 08/25/2022   CALCIUM 8.3 (L) 08/25/2022   PROT 6.2 (L) 08/25/2022   ALBUMIN 3.0 (L) 08/25/2022   AST 24 08/25/2022   ALT 12 08/25/2022   ALKPHOS 75 08/25/2022   BILITOT 0.4 08/25/2022   GFRNONAA >60 08/25/2022   GFRAA >60 10/03/2016    Lab Results  Component Value Date   WBC 6.3 08/25/2022   NEUTROABS 4.0 08/25/2022   HGB 10.5 (L) 08/25/2022   HCT 30.3 (L) 08/25/2022   MCV 101.3 (H) 08/25/2022   PLT 168 08/25/2022     STUDIES: No results found.  ASSESSMENT: Stage IIa adenocarcinoma of the rectum.  PLAN:    Stage IIa adenocarcinoma of the rectum: Recent imaging suggest progressive disease despite receiving neoadjuvant chemotherapy with FOLFOX as well as neoadjuvant concurrent capecitabine and XRT.  Surgical consult x 2 most recently at [facility] also concurrent that patient is no longer resectable.  Patient is change his mind and wishes to pursue palliative chemotherapy using FOLFOX plus Avastin.  Return to clinic on December 17, 2022 to initiate cycle 1.  Plan to reimage after cycle 6.  Appreciate palliative care input. Pain: Patient does not complain of this today.  Continue fentanyl patch 50 mcg every 72 hours and oxycodone 10 mg as needed.   Anemia: Chronic and unchanged.   Thrombocytopenia: Resolved. Hyponatremia: Chronic and unchanged.   Mouth pain/poor dentition: Patient does not complain of this today.  Patient was previously given the number of the local dentists to call.     I spent a total of 30 minutes reviewing chart data, face-to-face evaluation with the patient, counseling and coordination of care as detailed above.    Patient expressed understanding and was in agreement with this plan. He also understands that He can call clinic at any time with any questions, concerns, or complaints.    Cancer Staging  Adenocarcinoma of rectum [facility]) Staging form: Colon and Rectum,  AJCC 8th Edition - Clinical stage from 03/10/2022: Stage IIA (cT3, cN0, cM0) - Signed by Lloyd Huger, MD on 03/10/2022 Stage prefix: Initial diagnosis Total positive nodes: 0  Lloyd Huger, MD   12/05/2022 11:28 AM

## 2022-12-05 NOTE — Progress Notes (Signed)
DISCONTINUE ON PATHWAY REGIMEN - Colorectal     A cycle is every 14 days:     Oxaliplatin      Leucovorin      Fluorouracil      Fluorouracil   **Always confirm dose/schedule in your pharmacy ordering system**  REASON: Disease Progression PRIOR TREATMENT: ROS56: mFOLFOX6 q14 Days x 4 Months TREATMENT RESPONSE: Progressive Disease (PD)  START OFF PATHWAY REGIMEN - Colorectal   [id number]:FOLFIRI + Bevacizumab (Leucovorin IV D1 + Fluorouracil IV D1/CIV D1,2 + Irinotecan IV D1 + Bevacizumab IV D1) q14 Days:   A cycle is every 14 days:     Bevacizumab-xxxx      Irinotecan      Leucovorin      Fluorouracil      Fluorouracil   **Always confirm dose/schedule in your pharmacy ordering system**  Patient Characteristics: Preoperative or Nonsurgical Candidate, M0 (Clinical Staging), Rectal, cT2, cN1 or cT3, cN0-1, and Candidate for Sphincter-sparing Surgery Tumor Location: Rectal Therapeutic Status: Preoperative or Nonsurgical Candidate, M0 (Clinical Staging) AJCC T Category: cT3 AJCC N Category: cN0 AJCC M Category: cM0 AJCC 8 Stage Grouping: IIA Intent of Therapy: Non-Curative / Palliative Intent, Discussed with Patient

## 2022-12-10 ENCOUNTER — Inpatient Hospital Stay (HOSPITAL_BASED_OUTPATIENT_CLINIC_OR_DEPARTMENT_OTHER): Payer: Medicare Other | Admitting: Hospice and Palliative Medicine

## 2022-12-10 DIAGNOSIS — C20 Malignant neoplasm of rectum: Secondary | ICD-10-CM

## 2022-12-10 NOTE — Progress Notes (Signed)
Unable to reach patient.  Voicemail left.  Will reschedule.

## 2022-12-12 ENCOUNTER — Other Ambulatory Visit: Payer: Self-pay | Admitting: *Deleted

## 2022-12-12 MED ORDER — DEXAMETHASONE 2 MG PO TABS: 2.0000 mg | ORAL_TABLET | Freq: Two times a day (BID) | ORAL | 2 refills | Status: DC | PRN

## 2022-12-16 MED FILL — Dexamethasone Sodium Phosphate Inj 100 MG/10ML: INTRAMUSCULAR | Qty: 1 | Status: AC

## 2022-12-17 ENCOUNTER — Inpatient Hospital Stay: Payer: Medicare Other

## 2022-12-17 ENCOUNTER — Other Ambulatory Visit: Payer: Self-pay | Admitting: Oncology

## 2022-12-17 ENCOUNTER — Encounter: Payer: Self-pay | Admitting: Oncology

## 2022-12-17 ENCOUNTER — Inpatient Hospital Stay (HOSPITAL_BASED_OUTPATIENT_CLINIC_OR_DEPARTMENT_OTHER): Payer: Medicare Other | Admitting: Hospice and Palliative Medicine

## 2022-12-17 ENCOUNTER — Inpatient Hospital Stay (HOSPITAL_BASED_OUTPATIENT_CLINIC_OR_DEPARTMENT_OTHER): Payer: Medicare Other | Admitting: Oncology

## 2022-12-17 DIAGNOSIS — Z515 Encounter for palliative care: Secondary | ICD-10-CM | POA: Diagnosis not present

## 2022-12-17 DIAGNOSIS — G893 Neoplasm related pain (acute) (chronic): Secondary | ICD-10-CM | POA: Diagnosis not present

## 2022-12-17 DIAGNOSIS — C20 Malignant neoplasm of rectum: Secondary | ICD-10-CM

## 2022-12-17 DIAGNOSIS — D649 Anemia, unspecified: Secondary | ICD-10-CM | POA: Diagnosis not present

## 2022-12-17 DIAGNOSIS — Z79899 Other long term (current) drug therapy: Secondary | ICD-10-CM | POA: Diagnosis not present

## 2022-12-17 DIAGNOSIS — F1090 Alcohol use, unspecified, uncomplicated: Secondary | ICD-10-CM | POA: Diagnosis not present

## 2022-12-17 DIAGNOSIS — Z720 Tobacco use: Secondary | ICD-10-CM | POA: Diagnosis not present

## 2022-12-17 DIAGNOSIS — Z5111 Encounter for antineoplastic chemotherapy: Secondary | ICD-10-CM | POA: Diagnosis not present

## 2022-12-17 DIAGNOSIS — E871 Hypo-osmolality and hyponatremia: Secondary | ICD-10-CM | POA: Diagnosis not present

## 2022-12-17 DIAGNOSIS — Z933 Colostomy status: Secondary | ICD-10-CM | POA: Diagnosis not present

## 2022-12-17 LAB — COMPREHENSIVE METABOLIC PANEL
ALT: 25 U/L (ref 0–44)
AST: 28 U/L (ref 15–41)
Albumin: 3.4 g/dL — ABNORMAL LOW (ref 3.5–5.0)
Alkaline Phosphatase: 69 U/L (ref 38–126)
Anion gap: 11 (ref 5–15)
BUN: 12 mg/dL (ref 8–23)
CO2: 25 mmol/L (ref 22–32)
Calcium: 8.4 mg/dL — ABNORMAL LOW (ref 8.9–10.3)
Chloride: 97 mmol/L — ABNORMAL LOW (ref 98–111)
Creatinine, Ser: 0.56 mg/dL — ABNORMAL LOW (ref 0.61–1.24)
GFR, Estimated: >60 mL/min (ref >60)
Glucose, Bld: 125 mg/dL — ABNORMAL HIGH (ref 70–99)
Potassium: 3.1 mmol/L — ABNORMAL LOW (ref 3.5–5.1)
Sodium: 133 mmol/L — ABNORMAL LOW (ref 135–145)
Total Bilirubin: 0.4 mg/dL (ref 0.3–1.2)
Total Protein: 6.4 g/dL — ABNORMAL LOW (ref 6.5–8.1)

## 2022-12-17 LAB — CBC WITH DIFFERENTIAL/PLATELET
Abs Immature Granulocytes: 0.1 10*3/uL — ABNORMAL HIGH (ref 0.00–0.07)
Basophils Absolute: 0 10*3/uL (ref 0.0–0.1)
Basophils Relative: 0 %
Eosinophils Absolute: 0 10*3/uL (ref 0.0–0.5)
Eosinophils Relative: 0 %
HCT: 40 % (ref 39.0–52.0)
Hemoglobin: 13.6 g/dL (ref 13.0–17.0)
Immature Granulocytes: 1 %
Lymphocytes Relative: 8 %
Lymphs Abs: 0.8 10*3/uL (ref 0.7–4.0)
MCH: 32.6 pg (ref 26.0–34.0)
MCHC: 34 g/dL (ref 30.0–36.0)
MCV: 95.9 fL (ref 80.0–100.0)
Monocytes Absolute: 0.7 10*3/uL (ref 0.1–1.0)
Monocytes Relative: 8 %
Neutro Abs: 7.5 10*3/uL (ref 1.7–7.7)
Neutrophils Relative %: 83 %
Platelets: 159 10*3/uL (ref 150–400)
RBC: 4.17 MIL/uL — ABNORMAL LOW (ref 4.22–5.81)
RDW: 16.3 % — ABNORMAL HIGH (ref 11.5–15.5)
WBC: 9.1 10*3/uL (ref 4.0–10.5)
nRBC: 0 % (ref 0.0–0.2)

## 2022-12-17 LAB — URINALYSIS, DIPSTICK ONLY
Bilirubin Urine: NEGATIVE
Glucose, UA: NEGATIVE mg/dL
Hgb urine dipstick: NEGATIVE
Ketones, ur: NEGATIVE mg/dL
Nitrite: NEGATIVE
Protein, ur: 30 mg/dL — AB
Specific Gravity, Urine: 1.017 (ref 1.005–1.030)
pH: 5 (ref 5.0–8.0)

## 2022-12-17 MED ORDER — FLUOROURACIL CHEMO INJECTION 2.5 GM/50ML
400.0000 mg/m2 | Freq: Once | INTRAVENOUS | Status: AC
  Administered 2022-12-17: 700 mg via INTRAVENOUS
  Filled 2022-12-17: qty 14

## 2022-12-17 MED ORDER — SODIUM CHLORIDE 0.9 % IV SOLN
300.0000 mg | Freq: Once | INTRAVENOUS | Status: AC
  Administered 2022-12-17: 300 mg via INTRAVENOUS
  Filled 2022-12-17: qty 15

## 2022-12-17 MED ORDER — SODIUM CHLORIDE 0.9 % IV SOLN
400.0000 mg/m2 | Freq: Once | INTRAVENOUS | Status: AC
  Administered 2022-12-17: 708 mg via INTRAVENOUS
  Filled 2022-12-17: qty 35.4

## 2022-12-17 MED ORDER — POTASSIUM CHLORIDE CRYS ER 20 MEQ PO TBCR: 20.0000 meq | EXTENDED_RELEASE_TABLET | Freq: Every day | ORAL | 1 refills | Status: DC

## 2022-12-17 MED ORDER — HEPARIN SOD (PORK) LOCK FLUSH 100 UNIT/ML IV SOLN
500.0000 [IU] | Freq: Once | INTRAVENOUS | Status: DC | PRN
  Filled 2022-12-17: qty 5

## 2022-12-17 MED ORDER — SODIUM CHLORIDE 0.9 % IV SOLN
10.0000 mg | Freq: Once | INTRAVENOUS | Status: AC
  Administered 2022-12-17: 10 mg via INTRAVENOUS
  Filled 2022-12-17: qty 10

## 2022-12-17 MED ORDER — SODIUM CHLORIDE 0.9 % IV SOLN
5.0000 mg/kg | Freq: Once | INTRAVENOUS | Status: AC
  Administered 2022-12-17: 300 mg via INTRAVENOUS
  Filled 2022-12-17: qty 12

## 2022-12-17 MED ORDER — SODIUM CHLORIDE 0.9 % IV SOLN
Freq: Once | INTRAVENOUS | Status: AC
  Filled 2022-12-17: qty 250

## 2022-12-17 MED ORDER — PALONOSETRON HCL INJECTION 0.25 MG/5ML
0.2500 mg | Freq: Once | INTRAVENOUS | Status: AC
  Administered 2022-12-17: 0.25 mg via INTRAVENOUS
  Filled 2022-12-17: qty 5

## 2022-12-17 MED ORDER — SODIUM CHLORIDE 0.9 % IV SOLN
2400.0000 mg/m2 | INTRAVENOUS | Status: DC
  Administered 2022-12-17: 4250 mg via INTRAVENOUS
  Filled 2022-12-17: qty 85

## 2022-12-17 NOTE — Patient Instructions (Signed)
Kyle Larson  Discharge Instructions: Thank you for choosing [facility] to provide your oncology and hematology care.  If you have a lab appointment with the [facility], please go directly to the [facility] and check in at the registration area.  Wear comfortable clothing and clothing appropriate for easy access to any Portacath or PICC line.   We strive to give you quality time with your provider. You may need to reschedule your appointment if you arrive late (15 or more minutes).  Arriving late affects you and other patients whose appointments are after yours.  Also, if you miss three or more appointments without notifying the office, you may be dismissed from the clinic at the provider's discretion.      For prescription refill requests, have your pharmacy contact our office and allow 72 hours for refills to be completed.    Today you received the following chemotherapy and/or immunotherapy agents FOLFIRI      To help prevent nausea and vomiting after your treatment, we encourage you to take your nausea medication as directed.  BELOW ARE SYMPTOMS THAT SHOULD BE REPORTED IMMEDIATELY: *FEVER GREATER THAN 100.4 F (38 C) OR HIGHER *CHILLS OR SWEATING *NAUSEA AND VOMITING THAT IS NOT CONTROLLED WITH YOUR NAUSEA MEDICATION *UNUSUAL SHORTNESS OF BREATH *UNUSUAL BRUISING OR BLEEDING *URINARY PROBLEMS (pain or burning when urinating, or frequent urination) *BOWEL PROBLEMS (unusual diarrhea, constipation, pain near the anus) TENDERNESS IN MOUTH AND THROAT WITH OR WITHOUT PRESENCE OF ULCERS (sore throat, sores in mouth, or a toothache) UNUSUAL RASH, SWELLING OR PAIN  UNUSUAL VAGINAL DISCHARGE OR ITCHING   Items with * indicate a potential emergency and should be followed up as soon as possible or go to the Emergency Department if any problems should occur.  Please show the CHEMOTHERAPY ALERT CARD or IMMUNOTHERAPY ALERT CARD at check-in to  the Emergency Department and triage nurse.  Should you have questions after your visit or need to cancel or reschedule your appointment, please contact [facility]  [phone] and follow the prompts.  Office hours are 8:00 a.m. to 4:30 p.m. Monday - Friday. Please note that voicemails left after 4:00 p.m. may not be returned until the following business day.  We are closed weekends and major holidays. You have access to a nurse at all times for urgent questions. Please call the main number to the clinic [phone] and follow the prompts.  For any non-urgent questions, you may also contact your provider using MyChart. We now offer e-Visits for anyone 6 and older to request care online for non-urgent symptoms. For details visit [email or web address].   Also download the MyChart app! Go to the app store, search "MyChart", open the app, select [facility], and log in with your MyChart username and password.  Irinotecan Injection What is this medication? IRINOTECAN (ir in oh TEE kan) treats some types of cancer. It works by slowing down the growth of cancer cells. This medicine may be used for other purposes; ask your health care provider or pharmacist if you have questions. COMMON BRAND NAME(S): Camptosar What should I tell my care team before I take this medication? They need to know if you have any of these conditions: Dehydration Diarrhea Infection, especially a viral infection, such as chickenpox, cold sores, herpes Liver disease Low blood cell levels (white cells, red cells, and platelets) Low levels of electrolytes, such as calcium, magnesium, or potassium in your blood Recent or ongoing  radiation An unusual or allergic reaction to irinotecan, other medications, foods, dyes, or preservatives If you or your partner are pregnant or trying to get pregnant Breast-feeding How should I use this medication? This medication is injected into a vein. It is  given by your care team in a hospital or clinic setting. Talk to your care team about the use of this medication in children. Special care may be needed. Overdosage: If you think you have taken too much of this medicine contact a poison control center or emergency room at once. NOTE: This medicine is only for you. Do not share this medicine with others. What if I miss a dose? Keep appointments for follow-up doses. It is important not to miss your dose. Call your care team if you are unable to keep an appointment. What may interact with this medication? Do not take this medication with any of the following: Cobicistat Itraconazole This medication may also interact with the following: Certain antibiotics, such as clarithromycin, rifampin, rifabutin Certain antivirals for HIV or AIDS Certain medications for fungal infections, such as ketoconazole, posaconazole, voriconazole Certain medications for seizures, such as carbamazepine, phenobarbital, phenytoin Gemfibrozil Nefazodone St. John's wort This list may not describe all possible interactions. Give your health care provider a list of all the medicines, herbs, non-prescription drugs, or dietary supplements you use. Also tell them if you smoke, drink alcohol, or use illegal drugs. Some items may interact with your medicine. What should I watch for while using this medication? Your condition will be monitored carefully while you are receiving this medication. You may need blood work while taking this medication. This medication may make you feel generally unwell. This is not uncommon as chemotherapy can affect healthy cells as well as cancer cells. Report any side effects. Continue your course of treatment even though you feel ill unless your care team tells you to stop. This medication can cause serious side effects. To reduce the risk, your care team may give you other medications to take before receiving this one. Be sure to follow the  directions from your care team. This medication may affect your coordination, reaction time, or judgement. Do not drive or operate machinery until you know how this medication affects you. Sit up or stand slowly to reduce the risk of dizzy or fainting spells. Drinking alcohol with this medication can increase the risk of these side effects. This medication may increase your risk of getting an infection. Call your care team for advice if you get a fever, chills, sore throat, or other symptoms of a cold or flu. Do not treat yourself. Try to avoid being around people who are sick. Avoid taking medications that contain aspirin, acetaminophen, ibuprofen, naproxen, or ketoprofen unless instructed by your care team. These medications may hide a fever. This medication may increase your risk to bruise or bleed. Call your care team if you notice any unusual bleeding. Be careful brushing or flossing your teeth or using a toothpick because you may get an infection or bleed more easily. If you have any dental work done, tell your dentist you are receiving this medication. Talk to your care team if you or your partner are pregnant or think either of you might be pregnant. This medication can cause serious birth defects if taken during pregnancy and for 6 months after the last dose. You will need a negative pregnancy test before starting this medication. Contraception is recommended while taking this medication and for 6 months after the last dose. Your  care team can help you find the option that works for you. Do not father a child while taking this medication and for 3 months after the last dose. Use a condom for contraception during this time period. Do not breastfeed while taking this medication and for 7 days after the last dose. This medication may cause infertility. Talk to your care team if you are concerned about your fertility. What side effects may I notice from receiving this medication? Side effects that  you should report to your care team as soon as possible: Allergic reactions--skin rash, itching, hives, swelling of the face, lips, tongue, or throat Dry cough, shortness of breath or trouble breathing Increased saliva or tears, increased sweating, stomach cramping, diarrhea, small pupils, unusual weakness or fatigue, slow heartbeat Infection--fever, chills, cough, sore throat, wounds that don't heal, pain or trouble when passing urine, general feeling of discomfort or being unwell Kidney injury--decrease in the amount of urine, swelling of the ankles, hands, or feet Low red blood cell level--unusual weakness or fatigue, dizziness, headache, trouble breathing Severe or prolonged diarrhea Unusual bruising or bleeding Side effects that usually do not require medical attention (report to your care team if they continue or are bothersome): Constipation Diarrhea Hair loss Loss of appetite Nausea Stomach pain This list may not describe all possible side effects. Call your doctor for medical advice about side effects. You may report side effects to FDA at 1-800-FDA-1088. Where should I keep my medication? This medication is given in a hospital or clinic. It will not be stored at home. NOTE: This sheet is a summary. It may not cover all possible information. If you have questions about this medicine, talk to your doctor, pharmacist, or health care provider.  2023 Elsevier/Gold Standard (2022-03-20 00:00:00)  Bevacizumab Injection What is this medication? BEVACIZUMAB (be va SIZ yoo mab) treats some types of cancer. It works by blocking a protein that causes cancer cells to grow and multiply. This helps to slow or stop the spread of cancer cells. It is a monoclonal antibody. This medicine may be used for other purposes; ask your health care provider or pharmacist if you have questions. COMMON BRAND NAME(S): Alymsys, Avastin, MVASI, Noah Charon What should I tell my care team before I take this  medication? They need to know if you have any of these conditions: Blood clots Coughing up blood Having or recent surgery Heart failure High blood pressure History of a connection between 2 or more body parts that do not usually connect (fistula) History of a tear in your stomach or intestines Protein in your urine An unusual or allergic reaction to bevacizumab, other medications, foods, dyes, or preservatives Pregnant or trying to get pregnant Breast-feeding How should I use this medication? This medication is injected into a vein. It is given by your care team in a hospital or clinic setting. Talk to your care team the use of this medication in children. Special care may be needed. Overdosage: If you think you have taken too much of this medicine contact a poison control center or emergency room at once. NOTE: This medicine is only for you. Do not share this medicine with others. What if I miss a dose? Keep appointments for follow-up doses. It is important not to miss your dose. Call your care team if you are unable to keep an appointment. What may interact with this medication? Interactions are not expected. This list may not describe all possible interactions. Give your health care provider a list of  all the medicines, herbs, non-prescription drugs, or dietary supplements you use. Also tell them if you smoke, drink alcohol, or use illegal drugs. Some items may interact with your medicine. What should I watch for while using this medication? Your condition will be monitored carefully while you are receiving this medication. You may need blood work while taking this medication. This medication may make you feel generally unwell. This is not uncommon as chemotherapy can affect healthy cells as well as cancer cells. Report any side effects. Continue your course of treatment even though you feel ill unless your care team tells you to stop. This medication may increase your risk to bruise or  bleed. Call your care team if you notice any unusual bleeding. Before having surgery, talk to your care team to make sure it is ok. This medication can increase the risk of poor healing of your surgical site or wound. You will need to stop this medication for 28 days before surgery. After surgery, wait at least 28 days before restarting this medication. Make sure the surgical site or wound is healed enough before restarting this medication. Talk to your care team if questions. Talk to your care team if you may be pregnant. Serious birth defects can occur if you take this medication during pregnancy and for 6 months after the last dose. Contraception is recommended while taking this medication and for 6 months after the last dose. Your care team can help you find the option that works for you. Do not breastfeed while taking this medication and for 6 months after the last dose. This medication can cause infertility. Talk to your care team if you are concerned about your fertility. What side effects may I notice from receiving this medication? Side effects that you should report to your care team as soon as possible: Allergic reactions--skin rash, itching, hives, swelling of the face, lips, tongue, or throat Bleeding--bloody or black, tar-like stools, vomiting blood or brown material that looks like coffee grounds, red or dark brown urine, small red or purple spots on skin, unusual bruising or bleeding Blood clot--pain, swelling, or warmth in the leg, shortness of breath, chest pain Heart attack--pain or tightness in the chest, shoulders, arms, or jaw, nausea, shortness of breath, cold or clammy skin, feeling faint or lightheaded Heart failure--shortness of breath, swelling of the ankles, feet, or hands, sudden weight gain, unusual weakness or fatigue Increase in blood pressure Infection--fever, chills, cough, sore throat, wounds that don't heal, pain or trouble when passing urine, general feeling of  discomfort or being unwell Infusion reactions--chest pain, shortness of breath or trouble breathing, feeling faint or lightheaded Kidney injury--decrease in the amount of urine, swelling of the ankles, hands, or feet Stomach pain that is severe, does not go away, or gets worse Stroke--sudden numbness or weakness of the face, arm, or leg, trouble speaking, confusion, trouble walking, loss of balance or coordination, dizziness, severe headache, change in vision Sudden and severe headache, confusion, change in vision, seizures, which may be signs of posterior reversible encephalopathy syndrome (PRES) Side effects that usually do not require medical attention (report to your care team if they continue or are bothersome): Back pain Change in taste Diarrhea Dry skin Increased tears Nosebleed This list may not describe all possible side effects. Call your doctor for medical advice about side effects. You may report side effects to FDA at 1-800-FDA-1088. Where should I keep my medication? This medication is given in a hospital or clinic. It will not be stored  at home. NOTE: This sheet is a summary. It may not cover all possible information. If you have questions about this medicine, talk to your doctor, pharmacist, or health care provider.  2023 Elsevier/Gold Standard (2022-03-14 00:00:00)

## 2022-12-17 NOTE — Progress Notes (Signed)
[facility] at [facility] [phone] [phone]  Patient Care Team: Kathrine Haddock, NP as PCP - General (Nurse Practitioner) Jules Husbands, MD as Consulting Physician (General Surgery) Clent Jacks, RN as Oncology Nurse Navigator Grayland Ormond, Kathlene November, MD as Consulting Physician (Oncology) [name], MD as Consulting Physician (Cardiology) Michael Boston, MD as Consulting Physician (Colon and Rectal Surgery) Lesly Rubenstein, MD as Consulting Physician (Gastroenterology) Billey Co, MD as Consulting Physician (Urology)   NAME OF PATIENT: Kyle Larson  [id number]  November 01, 1956   DATE OF VISIT: 12/17/22  REASON FOR CONSULT: Kyle Larson is a 67 y.o. 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  Patient had disease progression.  He was referred to palliative care to address goals.  SOCIAL HISTORY: Patient lives at home with his wife and 3 children ages 53-14.  Patient previously drove a truck.  CODE STATUS: Full code   PAST MEDICAL HISTORY: Past Medical History:  Diagnosis Date   AC joint dislocation    CAD (coronary artery disease)    CVA (cerebral vascular accident) ([place])    Metatarsal bone fracture    3rd metatarsal , left foot; April 2021   Syncope    June '22   Tobacco abuse     PAST SURGICAL HISTORY:  Past Surgical History:  Procedure Laterality Date   ACROMIO-CLAVICULAR JOINT REPAIR Left 09/11/2016   Procedure: ACROMIO-CLAVICULAR RECONSTRUCTION;  Surgeon: Corky Mull, MD;  Location: [facility] ORS;  Service: Orthopedics;  Laterality: Left;  clavicle   FLEXIBLE SIGMOIDOSCOPY N/A 02/24/2022   Procedure: FLEXIBLE SIGMOIDOSCOPY;  Surgeon: Lesly Rubenstein, MD;  Location: [facility] ENDOSCOPY;  Service: Endoscopy;  Laterality: N/A;   IR IMAGING  GUIDED PORT INSERTION  03/10/2022   LEFT HEART CATH AND CORONARY ANGIOGRAPHY N/A 05/20/2021   Procedure: LEFT HEART CATH AND CORONARY ANGIOGRAPHY with coronary intervention;  Surgeon: [name], MD;  Location: [facility];  Service: Cardiovascular;  Laterality: N/A;   MANDIBLE RECONSTRUCTION     TONSILLECTOMY     AGE 49   XI ROBOTIC ASSISTED INGUINAL HERNIA REPAIR WITH MESH Right 03/26/2020   Procedure: XI ROBOTIC ASSISTED INGUINAL HERNIA REPAIR WITH MESH;  Surgeon: Herbert Pun, MD;  Location: [facility] ORS;  Service: General;  Laterality: Right;    HEMATOLOGY/ONCOLOGY HISTORY:  Oncology History  Adenocarcinoma of rectum ([place])  02/25/2022 Initial Diagnosis   Adenocarcinoma of rectum ([place])   03/10/2022 Cancer Staging   Staging form: Colon and Rectum, AJCC 8th Edition - Clinical stage from 03/10/2022: Stage IIA (cT3, cN0, cM0) - Signed by Lloyd Huger, MD on 03/10/2022 Stage prefix: Initial diagnosis Total positive nodes: 0   03/12/2022 - 07/11/2022 Chemotherapy   Patient is on Treatment Plan : COLORECTAL FOLFOX q14d x 4 months     12/17/2022 -  Chemotherapy   Patient is on Treatment Plan : COLORECTAL FOLFIRI + Bevacizumab q14d       ALLERGIES:  has No Known Allergies.  MEDICATIONS:  Current Outpatient Medications  Medication Sig Dispense Refill   acetaminophen (TYLENOL) 325 MG tablet Take 2 tablets (650 mg total) by mouth every 6 (six) hours as needed for mild pain. (Patient not taking: Reported on 11/26/2022)     ALPRAZolam (XANAX) 0.25 MG tablet Take 1 tablet (0.25 mg total) by mouth at bedtime as needed for sleep. 30 tablet 0   atorvastatin (LIPITOR)  40 MG tablet Take 1 tablet (40 mg total) by mouth daily. 30 tablet 0   dexamethasone (DECADRON) 2 MG tablet Take 1 tablet (2 mg total) by mouth 2 (two) times daily as needed. for appetite and nausea. 60 tablet 2   feeding supplement (ENSURE ENLIVE / ENSURE PLUS) LIQD Take 237 mLs by mouth 3 (three) times daily between  meals. (Patient not taking: Reported on 12/05/2022) 21330 mL 0   fentaNYL (DURAGESIC) 50 MCG/HR Place 1 patch onto the skin every 3 (three) days. 10 patch 0   gabapentin (NEURONTIN) 100 MG capsule Take 1 capsule (100 mg total) by mouth 3 (three) times daily. 90 capsule 0   lidocaine-prilocaine (EMLA) cream Apply 1 application  topically as needed. Apply cream to port 1 hour prior to chemotherapy. Cover with plastic wrap. 30 g 3   lidocaine-prilocaine (EMLA) cream Apply to affected area once 30 g 3   loperamide (IMODIUM A-D) 2 MG tablet Take 2 mg by mouth 4 (four) times daily as needed for diarrhea or loose stools. (Patient not taking: Reported on 11/06/2022)     loperamide (IMODIUM) 2 MG capsule Take 2 tabs by mouth with first loose stool, then 1 tab with each additional loose stool as needed. Do not exceed 8 tabs in a 24-hour period 60 capsule 2   Multiple Vitamin (MULTI-VITAMIN) tablet Take by mouth.     Multiple Vitamins-Minerals (MULTIVITAMIN WITH MINERALS) tablet Take 1 tablet by mouth daily.     nicotine (NICODERM CQ - DOSED IN MG/24 HOURS) 21 mg/24hr patch Place 21 mg onto the skin daily. (Patient not taking: Reported on 11/06/2022)     ondansetron (ZOFRAN) 8 MG tablet TAKE 1 TABLET 2 TIMES DAILY AS NEEDED FOR REFRACTORY NAUSEA / VOMITING. START DAY 3 AFTER CHEMO (Patient not taking: Reported on 10/03/2022) 60 tablet 2   Oxycodone HCl 10 MG TABS Take 1 tablet (10 mg total) by mouth every 6 (six) hours as needed. Okay to take 1/2 tablet as needed. 90 tablet 0   pantoprazole (PROTONIX) 40 MG tablet Take 1 tablet (40 mg total) by mouth daily. 30 tablet 2   polyethylene glycol (MIRALAX / GLYCOLAX) 17 g packet Take 17 g by mouth daily. (Patient not taking: Reported on 12/05/2022)     potassium chloride SA (KLOR-CON M) 20 MEQ tablet Take 1 tablet (20 mEq total) by mouth daily. 30 tablet 1   prochlorperazine (COMPAZINE) 10 MG tablet Take 1 tablet (10 mg total) by mouth every 6 (six) hours as needed  (Nausea or vomiting). (Patient not taking: Reported on 10/03/2022) 60 tablet 2   sildenafil (VIAGRA) 25 MG tablet Take 1 tablet (25 mg total) by mouth daily as needed for erectile dysfunction. 10 tablet 0   sotalol (BETAPACE) 80 MG tablet Take 80 mg by mouth daily.     tamsulosin (FLOMAX) 0.4 MG CAPS capsule Take 2 capsules (0.8 mg total) by mouth daily. Take one in the morning and one at night. 60 capsule 11   thiamine 100 MG tablet Take 1 tablet (100 mg total) by mouth daily. (Patient not taking: Reported on 10/03/2022) 30 tablet 0   No current facility-administered medications for this visit.    VITAL SIGNS: There were no vitals taken for this visit. There were no vitals filed for this visit.    Estimated body mass index is 18.58 kg/m as calculated from the following:   Height as of an earlier encounter on 12/17/22: 6' (1.829 m).   Weight as of  an earlier encounter on 12/17/22: 137 lb (62.1 kg).  LABS: CBC:    Component Value Date/Time   WBC 9.1 12/17/2022 0854   HGB 13.6 12/17/2022 0854   HCT 40.0 12/17/2022 0854   PLT 159 12/17/2022 0854   MCV 95.9 12/17/2022 0854   NEUTROABS 7.5 12/17/2022 0854   LYMPHSABS 0.8 12/17/2022 0854   MONOABS 0.7 12/17/2022 0854   EOSABS 0.0 12/17/2022 0854   BASOSABS 0.0 12/17/2022 0854   Comprehensive Metabolic Panel:    Component Value Date/Time   NA 133 (L) 12/17/2022 0854   K 3.1 (L) 12/17/2022 0854   CL 97 (L) 12/17/2022 0854   CO2 25 12/17/2022 0854   BUN 12 12/17/2022 0854   CREATININE 0.56 (L) 12/17/2022 0854   GLUCOSE 125 (H) 12/17/2022 0854   CALCIUM 8.4 (L) 12/17/2022 0854   AST 28 12/17/2022 0854   ALT 25 12/17/2022 0854   ALKPHOS 69 12/17/2022 0854   BILITOT 0.4 12/17/2022 0854   PROT 6.4 (L) 12/17/2022 0854   ALBUMIN 3.4 (L) 12/17/2022 0854    RADIOGRAPHIC STUDIES: No results found.  PERFORMANCE STATUS (ECOG) : 1 - Symptomatic but completely ambulatory  Review of Systems Unless otherwise noted, a complete review  of systems is negative.  Physical Exam General: NAD Cardiovascular: regular rate and rhythm Pulmonary: clear ant fields Abdomen: Ostomy noted but not visualized GU: no suprapubic tenderness Extremities: no edema, no joint deformities Skin: no rashes Neurological: Weakness but otherwise nonfocal  IMPRESSION: Follow-up visit with patient and wife.  Patient reports he is doing reasonably well.  He denies any significant changes or concerns.  No symptomatic complaints today.  Patient reports that he is eating well.  Pain is improved on current regimen.  He mostly wanted to talk today about issues he is having with his Clair Gulling.  He is looking forward to treatment.  Patient says that he is still working on completing advanced directive paperwork with his attorney.  PLAN: -Continue current scope of treatment -Continue fentanyl/oxycodone -Continue gabapentin 100 mg 3 times daily -Follow-up telephone visit 1 month   Thank you for allowing me to participate in the care of this very pleasant patient.   Time Total: 15 minutes  Visit consisted of counseling and education dealing with the complex and emotionally intense issues of symptom management in the setting of serious illness.Greater than 50%  of this time was spent counseling and coordinating care related to the above assessment and plan.  Signed by: Altha Harm, PhD, NP-C

## 2022-12-17 NOTE — Progress Notes (Signed)
. [name]  [phone]) [phone] [phone]) [phone]  ID: Kyle Larson OB: Apr 13, 1956  MR#: [id number]  ZHG#:[id number]  Patient Care Team: Kyle Haddock, NP as PCP - General (Nurse Practitioner) Kyle Husbands, MD as Consulting Physician (General Surgery) Kyle Jacks, RN as Oncology Nurse Navigator Kyle Larson, Kyle November, MD as Consulting Physician (Oncology) Kyle David, MD as Consulting Physician (Cardiology) Kyle Boston, MD as Consulting Physician (Colon and Rectal Surgery) Kyle Rubenstein, MD as Consulting Physician (Gastroenterology) Kyle Co, MD as Consulting Physician (Urology)  CHIEF COMPLAINT: Progressive adenocarcinoma of the rectum.  INTERVAL HISTORY: Patient returns to clinic today for further evaluation and initiation of cycle 1 of palliative FOLFIRI plus Avastin.  He currently feels well and is asymptomatic.  He does not complain of pain today. He has a good appetite and is gaining weight.  He denies any weakness or fatigue. He has no neurologic complaints.  He denies any recent fevers or illnesses.  He has no chest pain, shortness of breath, cough, or hemoptysis.  He has no nausea, vomiting, or constipation.  He does not report any melena or hematochezia.  He has no urinary complaints.  Patient offers no specific complaints today.  REVIEW OF SYSTEMS:   Review of Systems  Constitutional: Negative.  Negative for fever, malaise/fatigue and weight loss.  Respiratory: Negative.  Negative for cough, hemoptysis and shortness of breath.   Cardiovascular: Negative.  Negative for chest pain and leg swelling.  Gastrointestinal: Negative.  Negative for abdominal pain, blood in stool, constipation, diarrhea and melena.  Genitourinary: Negative.  Negative for dysuria.  Musculoskeletal: Negative.  Negative for back pain.  Skin: Negative.  Negative for rash.  Neurological: Negative.  Negative for dizziness, speech change, focal weakness,  weakness and headaches.  Psychiatric/Behavioral: Negative.  The patient is not nervous/anxious and does not have insomnia.     As per HPI. Otherwise, a complete review of systems is negative.  PAST MEDICAL HISTORY: Past Medical History:  Diagnosis Date   AC joint dislocation    CAD (coronary artery disease)    CVA (cerebral vascular accident) ([place])    Metatarsal bone fracture    3rd metatarsal , left foot; April 2021   Syncope    June '22   Tobacco abuse     PAST SURGICAL HISTORY: Past Surgical History:  Procedure Laterality Date   ACROMIO-CLAVICULAR JOINT REPAIR Left 09/11/2016   Procedure: ACROMIO-CLAVICULAR RECONSTRUCTION;  Surgeon: Kyle Mull, MD;  Location: [facility];  Service: Orthopedics;  Laterality: Left;  clavicle   FLEXIBLE SIGMOIDOSCOPY N/A 02/24/2022   Procedure: FLEXIBLE SIGMOIDOSCOPY;  Surgeon: Kyle Rubenstein, MD;  Location: [facility];  Service: Endoscopy;  Laterality: N/A;   IR IMAGING GUIDED PORT INSERTION  03/10/2022   LEFT HEART CATH AND CORONARY ANGIOGRAPHY N/A 05/20/2021   Procedure: LEFT HEART CATH AND CORONARY ANGIOGRAPHY with coronary intervention;  Surgeon: Kyle David, MD;  Location: [facility];  Service: Cardiovascular;  Laterality: N/A;   MANDIBLE RECONSTRUCTION     TONSILLECTOMY     AGE 3   XI ROBOTIC ASSISTED INGUINAL HERNIA REPAIR WITH MESH Right 03/26/2020   Procedure: XI ROBOTIC ASSISTED INGUINAL HERNIA REPAIR WITH MESH;  Surgeon: Kyle Pun, MD;  Location: [facility];  Service: General;  Laterality: Right;    FAMILY HISTORY: Family History  Problem Relation Age of Onset   Diabetes Mother    Prostate cancer Father     ADVANCED DIRECTIVES (Y/N):  N  HEALTH MAINTENANCE:  Social History   Tobacco Use   Smoking status: Some Days    Packs/day: 0.25    Years: 15.00    Total pack years: 3.75    Types: Cigarettes    Passive exposure: Current   Smokeless tobacco: Never  Vaping Use   Vaping Use: Never used   Substance Use Topics   Alcohol use: Yes    Alcohol/week: 42.0 standard drinks of alcohol    Types: 42 Cans of beer per week    Comment: 3 every other day   Drug use: No     Colonoscopy:  PAP:  Bone density:  Lipid panel:  No Known Allergies  Current Outpatient Medications  Medication Sig Dispense Refill   ALPRAZolam (XANAX) 0.25 MG tablet Take 1 tablet (0.25 mg total) by mouth at bedtime as needed for sleep. 30 tablet 0   atorvastatin (LIPITOR) 40 MG tablet Take 1 tablet (40 mg total) by mouth daily. 30 tablet 0   dexamethasone (DECADRON) 2 MG tablet Take 1 tablet (2 mg total) by mouth 2 (two) times daily as needed. for appetite and nausea. 60 tablet 2   fentaNYL (DURAGESIC) 50 MCG/HR Place 1 patch onto the skin every 3 (three) days. 10 patch 0   gabapentin (NEURONTIN) 100 MG capsule Take 1 capsule (100 mg total) by mouth 3 (three) times daily. 90 capsule 0   lidocaine-prilocaine (EMLA) cream Apply 1 application  topically as needed. Apply cream to port 1 hour prior to chemotherapy. Cover with plastic wrap. 30 g 3   lidocaine-prilocaine (EMLA) cream Apply to affected area once 30 g 3   loperamide (IMODIUM) 2 MG capsule Take 2 tabs by mouth with first loose stool, then 1 tab with each additional loose stool as needed. Do not exceed 8 tabs in a 24-hour period 60 capsule 2   Multiple Vitamin (MULTI-VITAMIN) tablet Take by mouth.     Multiple Vitamins-Minerals (MULTIVITAMIN WITH MINERALS) tablet Take 1 tablet by mouth daily.     Oxycodone HCl 10 MG TABS Take 1 tablet (10 mg total) by mouth every 6 (six) hours as needed. Okay to take 1/2 tablet as needed. 90 tablet 0   pantoprazole (PROTONIX) 40 MG tablet Take 1 tablet (40 mg total) by mouth daily. 30 tablet 2   sildenafil (VIAGRA) 25 MG tablet Take 1 tablet (25 mg total) by mouth daily as needed for erectile dysfunction. 10 tablet 0   sotalol (BETAPACE) 80 MG tablet Take 80 mg by mouth daily.     tamsulosin (FLOMAX) 0.4 MG CAPS capsule  Take 2 capsules (0.8 mg total) by mouth daily. Take one in the morning and one at night. 60 capsule 11   acetaminophen (TYLENOL) 325 MG tablet Take 2 tablets (650 mg total) by mouth every 6 (six) hours as needed for mild pain. (Patient not taking: Reported on 11/26/2022)     feeding supplement (ENSURE ENLIVE / ENSURE PLUS) LIQD Take 237 mLs by mouth 3 (three) times daily between meals. (Patient not taking: Reported on 12/05/2022) 21330 mL 0   loperamide (IMODIUM A-D) 2 MG tablet Take 2 mg by mouth 4 (four) times daily as needed for diarrhea or loose stools. (Patient not taking: Reported on 11/06/2022)     nicotine (NICODERM CQ - DOSED IN MG/24 HOURS) 21 mg/24hr patch Place 21 mg onto the skin daily. (Patient not taking: Reported on 11/06/2022)     ondansetron (ZOFRAN) 8 MG tablet TAKE 1 TABLET 2 TIMES DAILY AS NEEDED FOR REFRACTORY NAUSEA /  VOMITING. START DAY 3 AFTER CHEMO (Patient not taking: Reported on 10/03/2022) 60 tablet 2   polyethylene glycol (MIRALAX / GLYCOLAX) 17 g packet Take 17 g by mouth daily. (Patient not taking: Reported on 12/05/2022)     potassium chloride SA (KLOR-CON M) 20 MEQ tablet TAKE 1 TABLET(20 MEQ) BY MOUTH DAILY 90 tablet 0   prochlorperazine (COMPAZINE) 10 MG tablet Take 1 tablet (10 mg total) by mouth every 6 (six) hours as needed (Nausea or vomiting). (Patient not taking: Reported on 10/03/2022) 60 tablet 2   thiamine 100 MG tablet Take 1 tablet (100 mg total) by mouth daily. (Patient not taking: Reported on 10/03/2022) 30 tablet 0   No current facility-administered medications for this visit.   Facility-Administered Medications Ordered in Other Visits  Medication Dose Route Frequency Provider Last Rate Last Admin   fluorouracil (ADRUCIL) 4,250 mg in sodium chloride 0.9 % 65 mL chemo infusion  2,400 mg/m2 (Treatment Plan Recorded) Intravenous 1 day or 1 dose Lloyd Huger, MD   4,250 mg at 12/17/22 1425   heparin lock flush 100 unit/mL  500 Units Intracatheter Once  PRN Lloyd Huger, MD        OBJECTIVE: Vitals:   12/17/22 0908  BP: 139/83  Pulse: 87  Resp: 18  Temp: (!) 97.3 F (36.3 C)  SpO2: 99%      Body mass index is 18.58 kg/m.    ECOG FS:1 - Symptomatic but completely ambulatory  General: Well-developed, well-nourished, no acute distress. Eyes: Pink conjunctiva, anicteric sclera. HEENT: Normocephalic, moist mucous membranes. Lungs: No audible wheezing or coughing. Heart: Regular rate and rhythm. Abdomen: Soft, nontender, no obvious distention. Musculoskeletal: No edema, cyanosis, or clubbing. Neuro: Alert, answering all questions appropriately. Cranial nerves grossly intact. Skin: No rashes or petechiae noted. Psych: Normal affect.  LAB RESULTS:  Lab Results  Component Value Date   NA 133 (L) 12/17/2022   K 3.1 (L) 12/17/2022   CL 97 (L) 12/17/2022   CO2 25 12/17/2022   GLUCOSE 125 (H) 12/17/2022   BUN 12 12/17/2022   CREATININE 0.56 (L) 12/17/2022   CALCIUM 8.4 (L) 12/17/2022   PROT 6.4 (L) 12/17/2022   ALBUMIN 3.4 (L) 12/17/2022   AST 28 12/17/2022   ALT 25 12/17/2022   ALKPHOS 69 12/17/2022   BILITOT 0.4 12/17/2022   GFRNONAA >60 12/17/2022   GFRAA >60 10/03/2016    Lab Results  Component Value Date   WBC 9.1 12/17/2022   NEUTROABS 7.5 12/17/2022   HGB 13.6 12/17/2022   HCT 40.0 12/17/2022   MCV 95.9 12/17/2022   PLT 159 12/17/2022     STUDIES: No results found.  ASSESSMENT: Stage IIa adenocarcinoma of the rectum.  PLAN:    Stage IIa adenocarcinoma of the rectum: Recent imaging suggest progressive disease despite receiving neoadjuvant chemotherapy with FOLFOX as well as neoadjuvant concurrent capecitabine and XRT.  Surgical consult x 2 most recently at [facility] both agreed that patient's malignancy is no longer resectable.  Patient is change his mind and wishes to pursue palliative chemotherapy using FOLFIRI plus Avastin every 2 weeks.  Proceed with cycle 1 of treatment today.  Return to  clinic in 2 days for pump removal and then in 2 weeks for further evaluation and consideration of cycle 2.  Appreciate palliative care input. Pain: Well-controlled.  Continue fentanyl patch 50 mcg every 72 hours and oxycodone 10 mg as needed.   Anemia: Resolved.   Thrombocytopenia: Resolved. Hyponatremia: Chronic and unchanged.  Patient's  sodium is 133 today. Hypokalemia: Patient was given oral supplementation as well as dietary recommendations. Mouth pain/poor dentition: Patient does not complain of this today.     Patient expressed understanding and was in agreement with this plan. He also understands that He can call clinic at any time with any questions, concerns, or complaints.    Cancer Staging  Adenocarcinoma of rectum [facility]) Staging form: Colon and Rectum, AJCC 8th Edition - Clinical stage from 03/10/2022: Stage IIA (cT3, cN0, cM0) - Signed by Lloyd Huger, MD on 03/10/2022 Stage prefix: Initial diagnosis Total positive nodes: 0  Lloyd Huger, MD   12/17/2022 2:33 PM

## 2022-12-19 ENCOUNTER — Inpatient Hospital Stay: Payer: Medicare Other

## 2022-12-19 ENCOUNTER — Encounter: Payer: Self-pay | Admitting: Oncology

## 2022-12-19 VITALS — BP 160/91 | HR 82 | Resp 18

## 2022-12-19 DIAGNOSIS — Z933 Colostomy status: Secondary | ICD-10-CM | POA: Diagnosis not present

## 2022-12-19 DIAGNOSIS — D649 Anemia, unspecified: Secondary | ICD-10-CM | POA: Diagnosis not present

## 2022-12-19 DIAGNOSIS — Z5111 Encounter for antineoplastic chemotherapy: Secondary | ICD-10-CM | POA: Diagnosis not present

## 2022-12-19 DIAGNOSIS — C20 Malignant neoplasm of rectum: Secondary | ICD-10-CM | POA: Diagnosis not present

## 2022-12-19 DIAGNOSIS — Z720 Tobacco use: Secondary | ICD-10-CM | POA: Diagnosis not present

## 2022-12-19 DIAGNOSIS — F1090 Alcohol use, unspecified, uncomplicated: Secondary | ICD-10-CM | POA: Diagnosis not present

## 2022-12-19 DIAGNOSIS — Z79899 Other long term (current) drug therapy: Secondary | ICD-10-CM | POA: Diagnosis not present

## 2022-12-19 DIAGNOSIS — E871 Hypo-osmolality and hyponatremia: Secondary | ICD-10-CM | POA: Diagnosis not present

## 2022-12-19 MED ORDER — SODIUM CHLORIDE 0.9% FLUSH
10.0000 mL | INTRAVENOUS | Status: DC | PRN
  Administered 2022-12-19: 10 mL
  Filled 2022-12-19: qty 10

## 2022-12-19 MED ORDER — HEPARIN SOD (PORK) LOCK FLUSH 100 UNIT/ML IV SOLN
500.0000 [IU] | Freq: Once | INTRAVENOUS | Status: AC | PRN
  Administered 2022-12-19: 500 [IU]
  Filled 2022-12-19: qty 5

## 2022-12-25 ENCOUNTER — Other Ambulatory Visit: Payer: Self-pay | Admitting: *Deleted

## 2022-12-25 MED ORDER — GABAPENTIN 100 MG PO CAPS: 100.0000 mg | ORAL_CAPSULE | Freq: Three times a day (TID) | ORAL | 2 refills | Status: DC

## 2022-12-27 DIAGNOSIS — Z933 Colostomy status: Secondary | ICD-10-CM | POA: Diagnosis not present

## 2022-12-30 ENCOUNTER — Other Ambulatory Visit: Payer: Self-pay | Admitting: *Deleted

## 2022-12-30 MED ORDER — FENTANYL 50 MCG/HR TD PT72: 1.0000 | MEDICATED_PATCH | TRANSDERMAL | 0 refills | Status: DC

## 2022-12-30 MED FILL — Dexamethasone Sodium Phosphate Inj 100 MG/10ML: INTRAMUSCULAR | Qty: 1 | Status: AC

## 2022-12-30 NOTE — Telephone Encounter (Signed)
Patient called reporting that [facility] does not have Fentanyl 50 mcg patches and so he asked that I call [facility] [place] to see if they have any. [facility] does not, but [facility] it in [facility] to [facility] for #10 patches. Please resend to [facility] in [place]

## 2022-12-31 ENCOUNTER — Other Ambulatory Visit: Payer: Self-pay | Admitting: *Deleted

## 2022-12-31 ENCOUNTER — Inpatient Hospital Stay: Payer: Medicare Other

## 2022-12-31 ENCOUNTER — Encounter: Payer: Self-pay | Admitting: Oncology

## 2022-12-31 ENCOUNTER — Inpatient Hospital Stay: Payer: Medicare Other | Attending: Oncology

## 2022-12-31 ENCOUNTER — Inpatient Hospital Stay (HOSPITAL_BASED_OUTPATIENT_CLINIC_OR_DEPARTMENT_OTHER): Payer: Medicare Other | Admitting: Oncology

## 2022-12-31 VITALS — BP 123/82 | HR 99

## 2022-12-31 DIAGNOSIS — Z79899 Other long term (current) drug therapy: Secondary | ICD-10-CM | POA: Diagnosis not present

## 2022-12-31 DIAGNOSIS — Z5111 Encounter for antineoplastic chemotherapy: Secondary | ICD-10-CM | POA: Diagnosis not present

## 2022-12-31 DIAGNOSIS — C20 Malignant neoplasm of rectum: Secondary | ICD-10-CM

## 2022-12-31 DIAGNOSIS — E876 Hypokalemia: Secondary | ICD-10-CM | POA: Diagnosis not present

## 2022-12-31 LAB — COMPREHENSIVE METABOLIC PANEL
ALT: 27 U/L (ref 0–44)
AST: 23 U/L (ref 15–41)
Albumin: 3.5 g/dL (ref 3.5–5.0)
Alkaline Phosphatase: 58 U/L (ref 38–126)
Anion gap: 10 (ref 5–15)
BUN: 12 mg/dL (ref 8–23)
CO2: 26 mmol/L (ref 22–32)
Calcium: 8.4 mg/dL — ABNORMAL LOW (ref 8.9–10.3)
Chloride: 94 mmol/L — ABNORMAL LOW (ref 98–111)
Creatinine, Ser: 0.61 mg/dL (ref 0.61–1.24)
GFR, Estimated: >60 mL/min (ref >60)
Glucose, Bld: 109 mg/dL — ABNORMAL HIGH (ref 70–99)
Potassium: 3.2 mmol/L — ABNORMAL LOW (ref 3.5–5.1)
Sodium: 130 mmol/L — ABNORMAL LOW (ref 135–145)
Total Bilirubin: 1.1 mg/dL (ref 0.3–1.2)
Total Protein: 6.4 g/dL — ABNORMAL LOW (ref 6.5–8.1)

## 2022-12-31 LAB — CBC WITH DIFFERENTIAL/PLATELET
Abs Immature Granulocytes: 0.03 10*3/uL (ref 0.00–0.07)
Basophils Absolute: 0 10*3/uL (ref 0.0–0.1)
Basophils Relative: 1 %
Eosinophils Absolute: 0 10*3/uL (ref 0.0–0.5)
Eosinophils Relative: 1 %
HCT: 38.5 % — ABNORMAL LOW (ref 39.0–52.0)
Hemoglobin: 13.3 g/dL (ref 13.0–17.0)
Immature Granulocytes: 1 %
Lymphocytes Relative: 9 %
Lymphs Abs: 0.4 10*3/uL — ABNORMAL LOW (ref 0.7–4.0)
MCH: 32.2 pg (ref 26.0–34.0)
MCHC: 34.5 g/dL (ref 30.0–36.0)
MCV: 93.2 fL (ref 80.0–100.0)
Monocytes Absolute: 0.1 10*3/uL (ref 0.1–1.0)
Monocytes Relative: 3 %
Neutro Abs: 3.2 10*3/uL (ref 1.7–7.7)
Neutrophils Relative %: 85 %
Platelets: 165 10*3/uL (ref 150–400)
RBC: 4.13 MIL/uL — ABNORMAL LOW (ref 4.22–5.81)
RDW: 17.7 % — ABNORMAL HIGH (ref 11.5–15.5)
Smear Review: NORMAL
WBC: 3.7 10*3/uL — ABNORMAL LOW (ref 4.0–10.5)
nRBC: 0.5 % — ABNORMAL HIGH (ref 0.0–0.2)

## 2022-12-31 MED ORDER — PALONOSETRON HCL INJECTION 0.25 MG/5ML
0.2500 mg | Freq: Once | INTRAVENOUS | Status: AC
  Administered 2022-12-31: 0.25 mg via INTRAVENOUS
  Filled 2022-12-31: qty 5

## 2022-12-31 MED ORDER — SODIUM CHLORIDE 0.9 % IV SOLN
5.0000 mg/kg | Freq: Once | INTRAVENOUS | Status: AC
  Administered 2022-12-31: 300 mg via INTRAVENOUS
  Filled 2022-12-31: qty 12

## 2022-12-31 MED ORDER — SODIUM CHLORIDE 0.9 % IV SOLN
180.0000 mg/m2 | Freq: Once | INTRAVENOUS | Status: AC
  Administered 2022-12-31: 300 mg via INTRAVENOUS
  Filled 2022-12-31: qty 15

## 2022-12-31 MED ORDER — SODIUM CHLORIDE 0.9 % IV SOLN
Freq: Once | INTRAVENOUS | Status: AC
  Filled 2022-12-31: qty 250

## 2022-12-31 MED ORDER — OXYCODONE HCL 10 MG PO TABS: 10.0000 mg | ORAL_TABLET | Freq: Four times a day (QID) | ORAL | 0 refills | Status: DC | PRN

## 2022-12-31 MED ORDER — SODIUM CHLORIDE 0.9 % IV SOLN
700.0000 mg | Freq: Once | INTRAVENOUS | Status: AC
  Administered 2022-12-31: 700 mg via INTRAVENOUS
  Filled 2022-12-31: qty 35

## 2022-12-31 MED ORDER — SODIUM CHLORIDE 0.9 % IV SOLN
10.0000 mg | Freq: Once | INTRAVENOUS | Status: AC
  Administered 2022-12-31: 10 mg via INTRAVENOUS
  Filled 2022-12-31: qty 10

## 2022-12-31 MED ORDER — FLUOROURACIL CHEMO INJECTION 2.5 GM/50ML
400.0000 mg/m2 | Freq: Once | INTRAVENOUS | Status: AC
  Administered 2022-12-31: 700 mg via INTRAVENOUS
  Filled 2022-12-31: qty 14

## 2022-12-31 MED ORDER — SODIUM CHLORIDE 0.9 % IV SOLN
2400.0000 mg/m2 | INTRAVENOUS | Status: DC
  Administered 2022-12-31: 4250 mg via INTRAVENOUS
  Filled 2022-12-31: qty 85

## 2022-12-31 NOTE — Patient Instructions (Signed)
[facility]  Discharge Instructions: Thank you for choosing [facility] to provide your oncology and hematology care.  If you have a lab appointment with the [facility], please go directly to the [facility] and check in at the registration area.  Wear comfortable clothing and clothing appropriate for easy access to any Portacath or PICC line.   We strive to give you quality time with your provider. You may need to reschedule your appointment if you arrive late (15 or more minutes).  Arriving late affects you and other patients whose appointments are after yours.  Also, if you miss three or more appointments without notifying the office, you may be dismissed from the clinic at the provider's discretion.      For prescription refill requests, have your pharmacy contact our office and allow 72 hours for refills to be completed.    Today you received the following chemotherapy and/or immunotherapy agents FOLFIRI      To help prevent nausea and vomiting after your treatment, we encourage you to take your nausea medication as directed.  BELOW ARE SYMPTOMS THAT SHOULD BE REPORTED IMMEDIATELY: *FEVER GREATER THAN 100.4 F (38 C) OR HIGHER *CHILLS OR SWEATING *NAUSEA AND VOMITING THAT IS NOT CONTROLLED WITH YOUR NAUSEA MEDICATION *UNUSUAL SHORTNESS OF BREATH *UNUSUAL BRUISING OR BLEEDING *URINARY PROBLEMS (pain or burning when urinating, or frequent urination) *BOWEL PROBLEMS (unusual diarrhea, constipation, pain near the anus) TENDERNESS IN MOUTH AND THROAT WITH OR WITHOUT PRESENCE OF ULCERS (sore throat, sores in mouth, or a toothache) UNUSUAL RASH, SWELLING OR PAIN  UNUSUAL VAGINAL DISCHARGE OR ITCHING   Items with * indicate a potential emergency and should be followed up as soon as possible or go to the Emergency Department if any problems should occur.  Please show the CHEMOTHERAPY ALERT CARD or IMMUNOTHERAPY ALERT CARD at check-in to  the Emergency Department and triage nurse.  Should you have questions after your visit or need to cancel or reschedule your appointment, please contact [facility]  [phone] and follow the prompts.  Office hours are 8:00 a.m. to 4:30 p.m. Monday - Friday. Please note that voicemails left after 4:00 p.m. may not be returned until the following business day.  We are closed weekends and major holidays. You have access to a nurse at all times for urgent questions. Please call the main number to the clinic [phone] and follow the prompts.  For any non-urgent questions, you may also contact your provider using MyChart. We now offer e-Visits for anyone 20 and older to request care online for non-urgent symptoms. For details visit [email or web address].   Also download the MyChart app! Go to the app store, search "MyChart", open the app, select [facility], and log in with your MyChart username and password.  Irinotecan Injection What is this medication? IRINOTECAN (ir in oh TEE kan) treats some types of cancer. It works by slowing down the growth of cancer cells. This medicine may be used for other purposes; ask your health care provider or pharmacist if you have questions. COMMON BRAND NAME(S): Camptosar What should I tell my care team before I take this medication? They need to know if you have any of these conditions: Dehydration Diarrhea Infection, especially a viral infection, such as chickenpox, cold sores, herpes Liver disease Low blood cell levels (white cells, red cells, and platelets) Low levels of electrolytes, such as calcium, magnesium, or potassium in your blood Recent or ongoing  radiation An unusual or allergic reaction to irinotecan, other medications, foods, dyes, or preservatives If you or your partner are pregnant or trying to get pregnant Breast-feeding How should I use this medication? This medication is injected into a vein. It is  given by your care team in a hospital or clinic setting. Talk to your care team about the use of this medication in children. Special care may be needed. Overdosage: If you think you have taken too much of this medicine contact a poison control center or emergency room at once. NOTE: This medicine is only for you. Do not share this medicine with others. What if I miss a dose? Keep appointments for follow-up doses. It is important not to miss your dose. Call your care team if you are unable to keep an appointment. What may interact with this medication? Do not take this medication with any of the following: Cobicistat Itraconazole This medication may also interact with the following: Certain antibiotics, such as clarithromycin, rifampin, rifabutin Certain antivirals for HIV or AIDS Certain medications for fungal infections, such as ketoconazole, posaconazole, voriconazole Certain medications for seizures, such as carbamazepine, phenobarbital, phenytoin Gemfibrozil Nefazodone St. John's wort This list may not describe all possible interactions. Give your health care provider a list of all the medicines, herbs, non-prescription drugs, or dietary supplements you use. Also tell them if you smoke, drink alcohol, or use illegal drugs. Some items may interact with your medicine. What should I watch for while using this medication? Your condition will be monitored carefully while you are receiving this medication. You may need blood work while taking this medication. This medication may make you feel generally unwell. This is not uncommon as chemotherapy can affect healthy cells as well as cancer cells. Report any side effects. Continue your course of treatment even though you feel ill unless your care team tells you to stop. This medication can cause serious side effects. To reduce the risk, your care team may give you other medications to take before receiving this one. Be sure to follow the  directions from your care team. This medication may affect your coordination, reaction time, or judgement. Do not drive or operate machinery until you know how this medication affects you. Sit up or stand slowly to reduce the risk of dizzy or fainting spells. Drinking alcohol with this medication can increase the risk of these side effects. This medication may increase your risk of getting an infection. Call your care team for advice if you get a fever, chills, sore throat, or other symptoms of a cold or flu. Do not treat yourself. Try to avoid being around people who are sick. Avoid taking medications that contain aspirin, acetaminophen, ibuprofen, naproxen, or ketoprofen unless instructed by your care team. These medications may hide a fever. This medication may increase your risk to bruise or bleed. Call your care team if you notice any unusual bleeding. Be careful brushing or flossing your teeth or using a toothpick because you may get an infection or bleed more easily. If you have any dental work done, tell your dentist you are receiving this medication. Talk to your care team if you or your partner are pregnant or think either of you might be pregnant. This medication can cause serious birth defects if taken during pregnancy and for 6 months after the last dose. You will need a negative pregnancy test before starting this medication. Contraception is recommended while taking this medication and for 6 months after the last dose. Your  care team can help you find the option that works for you. Do not father a child while taking this medication and for 3 months after the last dose. Use a condom for contraception during this time period. Do not breastfeed while taking this medication and for 7 days after the last dose. This medication may cause infertility. Talk to your care team if you are concerned about your fertility. What side effects may I notice from receiving this medication? Side effects that  you should report to your care team as soon as possible: Allergic reactions--skin rash, itching, hives, swelling of the face, lips, tongue, or throat Dry cough, shortness of breath or trouble breathing Increased saliva or tears, increased sweating, stomach cramping, diarrhea, small pupils, unusual weakness or fatigue, slow heartbeat Infection--fever, chills, cough, sore throat, wounds that don't heal, pain or trouble when passing urine, general feeling of discomfort or being unwell Kidney injury--decrease in the amount of urine, swelling of the ankles, hands, or feet Low red blood cell level--unusual weakness or fatigue, dizziness, headache, trouble breathing Severe or prolonged diarrhea Unusual bruising or bleeding Side effects that usually do not require medical attention (report to your care team if they continue or are bothersome): Constipation Diarrhea Hair loss Loss of appetite Nausea Stomach pain This list may not describe all possible side effects. Call your doctor for medical advice about side effects. You may report side effects to FDA at 1-800-FDA-1088. Where should I keep my medication? This medication is given in a hospital or clinic. It will not be stored at home. NOTE: This sheet is a summary. It may not cover all possible information. If you have questions about this medicine, talk to your doctor, pharmacist, or health care provider.  2023 Elsevier/Gold Standard (2022-03-20 00:00:00)  Bevacizumab Injection What is this medication? BEVACIZUMAB (be va SIZ yoo mab) treats some types of cancer. It works by blocking a protein that causes cancer cells to grow and multiply. This helps to slow or stop the spread of cancer cells. It is a monoclonal antibody. This medicine may be used for other purposes; ask your health care provider or pharmacist if you have questions. COMMON BRAND NAME(S): Alymsys, Avastin, MVASI, Noah Charon What should I tell my care team before I take this  medication? They need to know if you have any of these conditions: Blood clots Coughing up blood Having or recent surgery Heart failure High blood pressure History of a connection between 2 or more body parts that do not usually connect (fistula) History of a tear in your stomach or intestines Protein in your urine An unusual or allergic reaction to bevacizumab, other medications, foods, dyes, or preservatives Pregnant or trying to get pregnant Breast-feeding How should I use this medication? This medication is injected into a vein. It is given by your care team in a hospital or clinic setting. Talk to your care team the use of this medication in children. Special care may be needed. Overdosage: If you think you have taken too much of this medicine contact a poison control center or emergency room at once. NOTE: This medicine is only for you. Do not share this medicine with others. What if I miss a dose? Keep appointments for follow-up doses. It is important not to miss your dose. Call your care team if you are unable to keep an appointment. What may interact with this medication? Interactions are not expected. This list may not describe all possible interactions. Give your health care provider a list of  all the medicines, herbs, non-prescription drugs, or dietary supplements you use. Also tell them if you smoke, drink alcohol, or use illegal drugs. Some items may interact with your medicine. What should I watch for while using this medication? Your condition will be monitored carefully while you are receiving this medication. You may need blood work while taking this medication. This medication may make you feel generally unwell. This is not uncommon as chemotherapy can affect healthy cells as well as cancer cells. Report any side effects. Continue your course of treatment even though you feel ill unless your care team tells you to stop. This medication may increase your risk to bruise or  bleed. Call your care team if you notice any unusual bleeding. Before having surgery, talk to your care team to make sure it is ok. This medication can increase the risk of poor healing of your surgical site or wound. You will need to stop this medication for 28 days before surgery. After surgery, wait at least 28 days before restarting this medication. Make sure the surgical site or wound is healed enough before restarting this medication. Talk to your care team if questions. Talk to your care team if you may be pregnant. Serious birth defects can occur if you take this medication during pregnancy and for 6 months after the last dose. Contraception is recommended while taking this medication and for 6 months after the last dose. Your care team can help you find the option that works for you. Do not breastfeed while taking this medication and for 6 months after the last dose. This medication can cause infertility. Talk to your care team if you are concerned about your fertility. What side effects may I notice from receiving this medication? Side effects that you should report to your care team as soon as possible: Allergic reactions--skin rash, itching, hives, swelling of the face, lips, tongue, or throat Bleeding--bloody or black, tar-like stools, vomiting blood or brown material that looks like coffee grounds, red or dark brown urine, small red or purple spots on skin, unusual bruising or bleeding Blood clot--pain, swelling, or warmth in the leg, shortness of breath, chest pain Heart attack--pain or tightness in the chest, shoulders, arms, or jaw, nausea, shortness of breath, cold or clammy skin, feeling faint or lightheaded Heart failure--shortness of breath, swelling of the ankles, feet, or hands, sudden weight gain, unusual weakness or fatigue Increase in blood pressure Infection--fever, chills, cough, sore throat, wounds that don't heal, pain or trouble when passing urine, general feeling of  discomfort or being unwell Infusion reactions--chest pain, shortness of breath or trouble breathing, feeling faint or lightheaded Kidney injury--decrease in the amount of urine, swelling of the ankles, hands, or feet Stomach pain that is severe, does not go away, or gets worse Stroke--sudden numbness or weakness of the face, arm, or leg, trouble speaking, confusion, trouble walking, loss of balance or coordination, dizziness, severe headache, change in vision Sudden and severe headache, confusion, change in vision, seizures, which may be signs of posterior reversible encephalopathy syndrome (PRES) Side effects that usually do not require medical attention (report to your care team if they continue or are bothersome): Back pain Change in taste Diarrhea Dry skin Increased tears Nosebleed This list may not describe all possible side effects. Call your doctor for medical advice about side effects. You may report side effects to FDA at 1-800-FDA-1088. Where should I keep my medication? This medication is given in a hospital or clinic. It will not be stored  at home. NOTE: This sheet is a summary. It may not cover all possible information. If you have questions about this medicine, talk to your doctor, pharmacist, or health care provider.  2023 Elsevier/Gold Standard (2022-03-14 00:00:00)

## 2022-12-31 NOTE — Progress Notes (Signed)
. [place]  [phone] [phone]  ID: Kyle Larson OB: 10-19-1956  MR#: [id number]  XAJ#:[id number]  Patient Care Team: Kathrine Haddock, NP as PCP - General (Nurse Practitioner) Jules Husbands, MD as Consulting Physician (General Surgery) Clent Jacks, RN as Oncology Nurse Navigator Grayland Ormond, Kathlene November, MD as Consulting Physician (Oncology) [name], MD as Consulting Physician (Cardiology) Michael Boston, MD as Consulting Physician (Colon and Rectal Surgery) Lesly Rubenstein, MD as Consulting Physician (Gastroenterology) Billey Co, MD as Consulting Physician (Urology)  CHIEF COMPLAINT: Progressive adenocarcinoma of the rectum.  INTERVAL HISTORY: Patient returns to clinic today for further evaluation and consideration of cycle 2 of palliative FOLFIRI plus Avastin.  He tolerated his first treatment well without significant side effects.  He had slightly increased pain this past week secondary to his fentanyl patch not sticking to his skin.  He otherwise feels well.  He has a good appetite and is gaining weight.  He denies any weakness or fatigue. He has no neurologic complaints.  He denies any recent fevers or illnesses.  He has no chest pain, shortness of breath, cough, or hemoptysis.  He has no nausea, vomiting, or constipation.  He does not report any melena or hematochezia.  He has no urinary complaints.  Patient offers no further specific complaints today.  REVIEW OF SYSTEMS:   Review of Systems  Constitutional: Negative.  Negative for fever, malaise/fatigue and weight loss.  Respiratory: Negative.  Negative for cough, hemoptysis and shortness of breath.   Cardiovascular: Negative.  Negative for chest pain and leg swelling.  Gastrointestinal: Negative.  Negative for abdominal pain, blood in stool, constipation, diarrhea and melena.  Genitourinary: Negative.  Negative for dysuria.  Musculoskeletal: Negative.  Negative for  back pain.  Skin: Negative.  Negative for rash.  Neurological: Negative.  Negative for dizziness, speech change, focal weakness, weakness and headaches.  Psychiatric/Behavioral: Negative.  The patient is not nervous/anxious and does not have insomnia.     As per HPI. Otherwise, a complete review of systems is negative.  PAST MEDICAL HISTORY: Past Medical History:  Diagnosis Date   AC joint dislocation    CAD (coronary artery disease)    CVA (cerebral vascular accident) ([place])    Metatarsal bone fracture    3rd metatarsal , left foot; April 2021   Syncope    June '22   Tobacco abuse     PAST SURGICAL HISTORY: Past Surgical History:  Procedure Laterality Date   ACROMIO-CLAVICULAR JOINT REPAIR Left 09/11/2016   Procedure: ACROMIO-CLAVICULAR RECONSTRUCTION;  Surgeon: Corky Mull, MD;  Location: [facility];  Service: Orthopedics;  Laterality: Left;  clavicle   FLEXIBLE SIGMOIDOSCOPY N/A 02/24/2022   Procedure: FLEXIBLE SIGMOIDOSCOPY;  Surgeon: Lesly Rubenstein, MD;  Location: [facility];  Service: Endoscopy;  Laterality: N/A;   IR IMAGING GUIDED PORT INSERTION  03/10/2022   LEFT HEART CATH AND CORONARY ANGIOGRAPHY N/A 05/20/2021   Procedure: LEFT HEART CATH AND CORONARY ANGIOGRAPHY with coronary intervention;  Surgeon: [name], MD;  Location: [place] [facility];  Service: Cardiovascular;  Laterality: N/A;   MANDIBLE RECONSTRUCTION     TONSILLECTOMY     AGE 67   XI ROBOTIC ASSISTED INGUINAL HERNIA REPAIR WITH MESH Right 03/26/2020   Procedure: XI ROBOTIC ASSISTED INGUINAL HERNIA REPAIR WITH MESH;  Surgeon: Herbert Pun, MD;  Location: [facility];  Service: General;  Laterality: Right;    FAMILY HISTORY: Family History  Problem Relation Age of Onset  Diabetes Mother    Prostate cancer Father     ADVANCED DIRECTIVES (Y/N):  N  HEALTH MAINTENANCE: Social History   Tobacco Use   Smoking status: Some Days    Packs/day: 0.25    Years: 15.00    Total pack  years: 3.75    Types: Cigarettes    Passive exposure: Current   Smokeless tobacco: Never  Vaping Use   Vaping Use: Never used  Substance Use Topics   Alcohol use: Yes    Alcohol/week: 42.0 standard drinks of alcohol    Types: 42 Cans of beer per week    Comment: 3 every other day   Drug use: No     Colonoscopy:  PAP:  Bone density:  Lipid panel:  No Known Allergies  Current Outpatient Medications  Medication Sig Dispense Refill   ALPRAZolam (XANAX) 0.25 MG tablet Take 1 tablet (0.25 mg total) by mouth at bedtime as needed for sleep. 30 tablet 0   atorvastatin (LIPITOR) 40 MG tablet Take 1 tablet (40 mg total) by mouth daily. 30 tablet 0   dexamethasone (DECADRON) 2 MG tablet Take 1 tablet (2 mg total) by mouth 2 (two) times daily as needed. for appetite and nausea. 60 tablet 2   fentaNYL (DURAGESIC) 50 MCG/HR Place 1 patch onto the skin every 3 (three) days. 10 patch 0   gabapentin (NEURONTIN) 100 MG capsule Take 1 capsule (100 mg total) by mouth 3 (three) times daily. 90 capsule 2   lidocaine-prilocaine (EMLA) cream Apply 1 application  topically as needed. Apply cream to port 1 hour prior to chemotherapy. Cover with plastic wrap. 30 g 3   lidocaine-prilocaine (EMLA) cream Apply to affected area once 30 g 3   loperamide (IMODIUM) 2 MG capsule Take 2 tabs by mouth with first loose stool, then 1 tab with each additional loose stool as needed. Do not exceed 8 tabs in a 24-hour period 60 capsule 2   Multiple Vitamin (MULTI-VITAMIN) tablet Take by mouth.     Multiple Vitamins-Minerals (MULTIVITAMIN WITH MINERALS) tablet Take 1 tablet by mouth daily.     Oxycodone HCl 10 MG TABS Take 1 tablet (10 mg total) by mouth every 6 (six) hours as needed. Okay to take 1/2 tablet as needed. 90 tablet 0   pantoprazole (PROTONIX) 40 MG tablet Take 1 tablet (40 mg total) by mouth daily. 30 tablet 2   potassium chloride SA (KLOR-CON M) 20 MEQ tablet TAKE 1 TABLET(20 MEQ) BY MOUTH DAILY 90 tablet 0    sildenafil (VIAGRA) 25 MG tablet Take 1 tablet (25 mg total) by mouth daily as needed for erectile dysfunction. 10 tablet 0   sotalol (BETAPACE) 80 MG tablet Take 80 mg by mouth daily.     tamsulosin (FLOMAX) 0.4 MG CAPS capsule Take 2 capsules (0.8 mg total) by mouth daily. Take one in the morning and one at night. 60 capsule 11   acetaminophen (TYLENOL) 325 MG tablet Take 2 tablets (650 mg total) by mouth every 6 (six) hours as needed for mild pain. (Patient not taking: Reported on 11/26/2022)     feeding supplement (ENSURE ENLIVE / ENSURE PLUS) LIQD Take 237 mLs by mouth 3 (three) times daily between meals. (Patient not taking: Reported on 12/05/2022) 21330 mL 0   loperamide (IMODIUM A-D) 2 MG tablet Take 2 mg by mouth 4 (four) times daily as needed for diarrhea or loose stools. (Patient not taking: Reported on 11/06/2022)     nicotine (NICODERM CQ - DOSED  IN MG/24 HOURS) 21 mg/24hr patch Place 21 mg onto the skin daily. (Patient not taking: Reported on 11/06/2022)     ondansetron (ZOFRAN) 8 MG tablet TAKE 1 TABLET 2 TIMES DAILY AS NEEDED FOR REFRACTORY NAUSEA / VOMITING. START DAY 3 AFTER CHEMO (Patient not taking: Reported on 10/03/2022) 60 tablet 2   polyethylene glycol (MIRALAX / GLYCOLAX) 17 g packet Take 17 g by mouth daily. (Patient not taking: Reported on 12/05/2022)     prochlorperazine (COMPAZINE) 10 MG tablet Take 1 tablet (10 mg total) by mouth every 6 (six) hours as needed (Nausea or vomiting). (Patient not taking: Reported on 10/03/2022) 60 tablet 2   thiamine 100 MG tablet Take 1 tablet (100 mg total) by mouth daily. (Patient not taking: Reported on 10/03/2022) 30 tablet 0   No current facility-administered medications for this visit.   Facility-Administered Medications Ordered in Other Visits  Medication Dose Route Frequency Provider Last Rate Last Admin   bevacizumab-awwb (MVASI) 300 mg in sodium chloride 0.9 % 100 mL chemo infusion  5 mg/kg (Treatment Plan Recorded) Intravenous Once  Lloyd Huger, MD       dexamethasone (DECADRON) 10 mg in sodium chloride 0.9 % 50 mL IVPB  10 mg Intravenous Once Lloyd Huger, MD 204 mL/hr at 12/31/22 1009 10 mg at 12/31/22 1009   fluorouracil (ADRUCIL) 4,250 mg in sodium chloride 0.9 % 65 mL chemo infusion  2,400 mg/m2 (Treatment Plan Recorded) Intravenous 1 day or 1 dose Lloyd Huger, MD       fluorouracil (ADRUCIL) chemo injection 700 mg  400 mg/m2 (Treatment Plan Recorded) Intravenous Once Lloyd Huger, MD       irinotecan (CAMPTOSAR) 300 mg in sodium chloride 0.9 % 500 mL chemo infusion  180 mg/m2 (Treatment Plan Recorded) Intravenous Once Lloyd Huger, MD       leucovorin 700 mg in sodium chloride 0.9 % 250 mL infusion  700 mg Intravenous Once Lloyd Huger, MD        OBJECTIVE: Vitals:   12/31/22 0911  BP: 129/87  Pulse: (!) 122  Resp: 18  Temp: 98.6 F (37 C)  SpO2: 98%      Body mass index is 18.16 kg/m.    ECOG FS:1 - Symptomatic but completely ambulatory  General: Well-developed, well-nourished, no acute distress. Eyes: Pink conjunctiva, anicteric sclera. HEENT: Normocephalic, moist mucous membranes. Lungs: No audible wheezing or coughing. Heart: Regular rate and rhythm. Abdomen: Soft, nontender, no obvious distention.  Colostomy bag noted. Musculoskeletal: No edema, cyanosis, or clubbing. Neuro: Alert, answering all questions appropriately. Cranial nerves grossly intact. Skin: No rashes or petechiae noted. Psych: Normal affect.  LAB RESULTS:  Lab Results  Component Value Date   NA 130 (L) 12/31/2022   K 3.2 (L) 12/31/2022   CL 94 (L) 12/31/2022   CO2 26 12/31/2022   GLUCOSE 109 (H) 12/31/2022   BUN 12 12/31/2022   CREATININE 0.61 12/31/2022   CALCIUM 8.4 (L) 12/31/2022   PROT 6.4 (L) 12/31/2022   ALBUMIN 3.5 12/31/2022   AST 23 12/31/2022   ALT 27 12/31/2022   ALKPHOS 58 12/31/2022   BILITOT 1.1 12/31/2022   GFRNONAA >60 12/31/2022   GFRAA >60 10/03/2016     Lab Results  Component Value Date   WBC 3.7 (L) 12/31/2022   NEUTROABS 3.2 12/31/2022   HGB 13.3 12/31/2022   HCT 38.5 (L) 12/31/2022   MCV 93.2 12/31/2022   PLT 165 12/31/2022     STUDIES: No  results found.  ASSESSMENT: Stage IIa adenocarcinoma of the rectum.  PLAN:    Stage IIa adenocarcinoma of the rectum: Recent imaging suggest progressive disease despite receiving neoadjuvant chemotherapy with FOLFOX as well as neoadjuvant concurrent capecitabine and XRT.  Surgical consult x 2, most recently at [facility], both agreed that patient's malignancy is no longer resectable.  Patient has changed his mind and wishes to pursue palliative chemotherapy using FOLFIRI plus Avastin every 2 weeks.  Proceed with cycle 2 of treatment today.  Return to clinic in 2 days for pump removal and then in 2 weeks for further evaluation and consideration of cycle 3.  Will reimage after cycle 6.  Appreciate palliative care input. Pain: Well-controlled.  Continue fentanyl patch 50 mcg every 72 hours and oxycodone 10 mg as needed.   Anemia: Resolved.   Thrombocytopenia: Resolved. Hyponatremia: Sodium levels have trended down to 130, monitor.   Hypokalemia: Chronic and unchanged.  Continue oral supplementation as well as dietary changes.   Mouth pain/poor dentition: Patient does not complain of this today.   Leukopenia: Mild, monitor.   Patient expressed understanding and was in agreement with this plan. He also understands that He can call clinic at any time with any questions, concerns, or complaints.    Cancer Staging  Adenocarcinoma of rectum [facility]) Staging form: Colon and Rectum, AJCC 8th Edition - Clinical stage from 03/10/2022: Stage IIA (cT3, cN0, cM0) - Signed by Lloyd Huger, MD on 03/10/2022 Stage prefix: Initial diagnosis Total positive nodes: 0  Lloyd Huger, MD   12/31/2022 10:13 AM

## 2023-01-02 ENCOUNTER — Inpatient Hospital Stay: Payer: Medicare Other

## 2023-01-02 VITALS — BP 139/95 | HR 89

## 2023-01-02 DIAGNOSIS — E876 Hypokalemia: Secondary | ICD-10-CM | POA: Diagnosis not present

## 2023-01-02 DIAGNOSIS — Z5111 Encounter for antineoplastic chemotherapy: Secondary | ICD-10-CM | POA: Diagnosis not present

## 2023-01-02 DIAGNOSIS — C20 Malignant neoplasm of rectum: Secondary | ICD-10-CM

## 2023-01-02 DIAGNOSIS — Z79899 Other long term (current) drug therapy: Secondary | ICD-10-CM | POA: Diagnosis not present

## 2023-01-02 MED ORDER — SODIUM CHLORIDE 0.9% FLUSH
10.0000 mL | INTRAVENOUS | Status: DC | PRN
  Administered 2023-01-02: 10 mL
  Filled 2023-01-02: qty 10

## 2023-01-02 MED ORDER — HEPARIN SOD (PORK) LOCK FLUSH 100 UNIT/ML IV SOLN
500.0000 [IU] | Freq: Once | INTRAVENOUS | Status: AC | PRN
  Administered 2023-01-02: 500 [IU]
  Filled 2023-01-02: qty 5

## 2023-01-06 ENCOUNTER — Emergency Department: Payer: Medicare Other

## 2023-01-06 ENCOUNTER — Inpatient Hospital Stay
Admission: EM | Admit: 2023-01-06 | Discharge: 2023-01-10 | DRG: 871 | Discharge status: Against Medical Advice | Payer: Medicare Other | Attending: Family Medicine | Admitting: Family Medicine

## 2023-01-06 ENCOUNTER — Other Ambulatory Visit: Payer: Self-pay

## 2023-01-06 DIAGNOSIS — E876 Hypokalemia: Secondary | ICD-10-CM

## 2023-01-06 DIAGNOSIS — R6520 Severe sepsis without septic shock: Secondary | ICD-10-CM | POA: Diagnosis present

## 2023-01-06 DIAGNOSIS — E860 Dehydration: Secondary | ICD-10-CM | POA: Diagnosis present

## 2023-01-06 DIAGNOSIS — K56600 Partial intestinal obstruction, unspecified as to cause: Secondary | ICD-10-CM | POA: Diagnosis not present

## 2023-01-06 DIAGNOSIS — Z87891 Personal history of nicotine dependence: Secondary | ICD-10-CM

## 2023-01-06 DIAGNOSIS — S2242XA Multiple fractures of ribs, left side, initial encounter for closed fracture: Secondary | ICD-10-CM | POA: Diagnosis not present

## 2023-01-06 DIAGNOSIS — J9601 Acute respiratory failure with hypoxia: Secondary | ICD-10-CM | POA: Diagnosis not present

## 2023-01-06 DIAGNOSIS — I2510 Atherosclerotic heart disease of native coronary artery without angina pectoris: Secondary | ICD-10-CM | POA: Diagnosis not present

## 2023-01-06 DIAGNOSIS — C7800 Secondary malignant neoplasm of unspecified lung: Secondary | ICD-10-CM | POA: Diagnosis not present

## 2023-01-06 DIAGNOSIS — D709 Neutropenia, unspecified: Secondary | ICD-10-CM | POA: Diagnosis not present

## 2023-01-06 DIAGNOSIS — R5081 Fever presenting with conditions classified elsewhere: Secondary | ICD-10-CM | POA: Diagnosis present

## 2023-01-06 DIAGNOSIS — K449 Diaphragmatic hernia without obstruction or gangrene: Secondary | ICD-10-CM | POA: Diagnosis not present

## 2023-01-06 DIAGNOSIS — N400 Enlarged prostate without lower urinary tract symptoms: Secondary | ICD-10-CM | POA: Diagnosis present

## 2023-01-06 DIAGNOSIS — Z1152 Encounter for screening for COVID-19: Secondary | ICD-10-CM

## 2023-01-06 DIAGNOSIS — D6959 Other secondary thrombocytopenia: Secondary | ICD-10-CM | POA: Diagnosis not present

## 2023-01-06 DIAGNOSIS — Z5329 Procedure and treatment not carried out because of patient's decision for other reasons: Secondary | ICD-10-CM | POA: Diagnosis not present

## 2023-01-06 DIAGNOSIS — I499 Cardiac arrhythmia, unspecified: Secondary | ICD-10-CM | POA: Diagnosis not present

## 2023-01-06 DIAGNOSIS — B961 Klebsiella pneumoniae [K. pneumoniae] as the cause of diseases classified elsewhere: Secondary | ICD-10-CM | POA: Diagnosis present

## 2023-01-06 DIAGNOSIS — I493 Ventricular premature depolarization: Secondary | ICD-10-CM | POA: Diagnosis present

## 2023-01-06 DIAGNOSIS — B9689 Other specified bacterial agents as the cause of diseases classified elsewhere: Secondary | ICD-10-CM | POA: Diagnosis present

## 2023-01-06 DIAGNOSIS — D701 Agranulocytosis secondary to cancer chemotherapy: Secondary | ICD-10-CM | POA: Diagnosis not present

## 2023-01-06 DIAGNOSIS — Z833 Family history of diabetes mellitus: Secondary | ICD-10-CM

## 2023-01-06 DIAGNOSIS — R7881 Bacteremia: Secondary | ICD-10-CM

## 2023-01-06 DIAGNOSIS — B009 Herpesviral infection, unspecified: Secondary | ICD-10-CM | POA: Diagnosis present

## 2023-01-06 DIAGNOSIS — E878 Other disorders of electrolyte and fluid balance, not elsewhere classified: Secondary | ICD-10-CM | POA: Diagnosis present

## 2023-01-06 DIAGNOSIS — C779 Secondary and unspecified malignant neoplasm of lymph node, unspecified: Secondary | ICD-10-CM | POA: Diagnosis not present

## 2023-01-06 DIAGNOSIS — Z8673 Personal history of transient ischemic attack (TIA), and cerebral infarction without residual deficits: Secondary | ICD-10-CM

## 2023-01-06 DIAGNOSIS — D61818 Other pancytopenia: Secondary | ICD-10-CM

## 2023-01-06 DIAGNOSIS — Z8042 Family history of malignant neoplasm of prostate: Secondary | ICD-10-CM

## 2023-01-06 DIAGNOSIS — Z743 Need for continuous supervision: Secondary | ICD-10-CM | POA: Diagnosis not present

## 2023-01-06 DIAGNOSIS — E785 Hyperlipidemia, unspecified: Secondary | ICD-10-CM | POA: Diagnosis not present

## 2023-01-06 DIAGNOSIS — T451X5A Adverse effect of antineoplastic and immunosuppressive drugs, initial encounter: Secondary | ICD-10-CM | POA: Diagnosis not present

## 2023-01-06 DIAGNOSIS — R531 Weakness: Secondary | ICD-10-CM | POA: Diagnosis not present

## 2023-01-06 DIAGNOSIS — K130 Diseases of lips: Secondary | ICD-10-CM | POA: Diagnosis present

## 2023-01-06 DIAGNOSIS — C20 Malignant neoplasm of rectum: Secondary | ICD-10-CM | POA: Diagnosis not present

## 2023-01-06 DIAGNOSIS — J439 Emphysema, unspecified: Secondary | ICD-10-CM | POA: Diagnosis present

## 2023-01-06 DIAGNOSIS — R109 Unspecified abdominal pain: Secondary | ICD-10-CM | POA: Diagnosis not present

## 2023-01-06 DIAGNOSIS — J189 Pneumonia, unspecified organism: Secondary | ICD-10-CM

## 2023-01-06 DIAGNOSIS — R0689 Other abnormalities of breathing: Secondary | ICD-10-CM | POA: Diagnosis not present

## 2023-01-06 DIAGNOSIS — F17210 Nicotine dependence, cigarettes, uncomplicated: Secondary | ICD-10-CM | POA: Diagnosis not present

## 2023-01-06 DIAGNOSIS — J1569 Pneumonia due to other gram-negative bacteria: Secondary | ICD-10-CM | POA: Diagnosis not present

## 2023-01-06 DIAGNOSIS — A419 Sepsis, unspecified organism: Principal | ICD-10-CM | POA: Diagnosis present

## 2023-01-06 DIAGNOSIS — Z933 Colostomy status: Secondary | ICD-10-CM | POA: Diagnosis not present

## 2023-01-06 DIAGNOSIS — R000 Tachycardia, unspecified: Secondary | ICD-10-CM | POA: Diagnosis not present

## 2023-01-06 DIAGNOSIS — E871 Hypo-osmolality and hyponatremia: Secondary | ICD-10-CM | POA: Diagnosis not present

## 2023-01-06 DIAGNOSIS — K56609 Unspecified intestinal obstruction, unspecified as to partial versus complete obstruction: Secondary | ICD-10-CM | POA: Diagnosis not present

## 2023-01-06 DIAGNOSIS — Z85048 Personal history of other malignant neoplasm of rectum, rectosigmoid junction, and anus: Secondary | ICD-10-CM

## 2023-01-06 DIAGNOSIS — E861 Hypovolemia: Secondary | ICD-10-CM | POA: Diagnosis present

## 2023-01-06 LAB — RESP PANEL BY RT-PCR (RSV, FLU A&B, COVID)  RVPGX2
Influenza A by PCR: NEGATIVE
Influenza B by PCR: NEGATIVE
Resp Syncytial Virus by PCR: NEGATIVE
SARS Coronavirus 2 by RT PCR: NEGATIVE

## 2023-01-06 LAB — CBC WITH DIFFERENTIAL/PLATELET
Abs Immature Granulocytes: 0.03 10*3/uL (ref 0.00–0.07)
Basophils Absolute: 0 10*3/uL (ref 0.0–0.1)
Basophils Relative: 3 %
Eosinophils Absolute: 0 10*3/uL (ref 0.0–0.5)
Eosinophils Relative: 7 %
HCT: 35.6 % — ABNORMAL LOW (ref 39.0–52.0)
Hemoglobin: 12.8 g/dL — ABNORMAL LOW (ref 13.0–17.0)
Immature Granulocytes: 10 %
Lymphocytes Relative: 56 %
Lymphs Abs: 0.2 10*3/uL — ABNORMAL LOW (ref 0.7–4.0)
MCH: 32.6 pg (ref 26.0–34.0)
MCHC: 36 g/dL (ref 30.0–36.0)
MCV: 90.6 fL (ref 80.0–100.0)
Monocytes Absolute: 0 10*3/uL — ABNORMAL LOW (ref 0.1–1.0)
Monocytes Relative: 10 %
Neutro Abs: 0 10*3/uL — CL (ref 1.7–7.7)
Neutrophils Relative %: 14 %
Platelets: 61 10*3/uL — ABNORMAL LOW (ref 150–400)
RBC: 3.93 MIL/uL — ABNORMAL LOW (ref 4.22–5.81)
RDW: 17.1 % — ABNORMAL HIGH (ref 11.5–15.5)
Smear Review: NORMAL
WBC: 0.3 10*3/uL — CL (ref 4.0–10.5)
nRBC: 0 % (ref 0.0–0.2)

## 2023-01-06 LAB — URINALYSIS, COMPLETE (UACMP) WITH MICROSCOPIC
Bilirubin Urine: NEGATIVE
Glucose, UA: NEGATIVE mg/dL
Ketones, ur: NEGATIVE mg/dL
Leukocytes,Ua: NEGATIVE
Nitrite: NEGATIVE
Protein, ur: 30 mg/dL — AB
Specific Gravity, Urine: 1.017 (ref 1.005–1.030)
Squamous Epithelial / HPF: NONE SEEN /HPF (ref 0–5)
pH: 7 (ref 5.0–8.0)

## 2023-01-06 LAB — TROPONIN I (HIGH SENSITIVITY)
Troponin I (High Sensitivity): 11 ng/L (ref <18)
Troponin I (High Sensitivity): 27 ng/L — ABNORMAL HIGH (ref <18)

## 2023-01-06 LAB — APTT: aPTT: 30 seconds (ref 24–36)

## 2023-01-06 LAB — COMPREHENSIVE METABOLIC PANEL
ALT: 20 U/L (ref 0–44)
AST: 29 U/L (ref 15–41)
Albumin: 2.6 g/dL — ABNORMAL LOW (ref 3.5–5.0)
Alkaline Phosphatase: 96 U/L (ref 38–126)
Anion gap: 12 (ref 5–15)
BUN: 16 mg/dL (ref 8–23)
CO2: 25 mmol/L (ref 22–32)
Calcium: 7.8 mg/dL — ABNORMAL LOW (ref 8.9–10.3)
Chloride: 92 mmol/L — ABNORMAL LOW (ref 98–111)
Creatinine, Ser: 0.64 mg/dL (ref 0.61–1.24)
GFR, Estimated: >60 mL/min (ref >60)
Glucose, Bld: 88 mg/dL (ref 70–99)
Potassium: 3.2 mmol/L — ABNORMAL LOW (ref 3.5–5.1)
Sodium: 129 mmol/L — ABNORMAL LOW (ref 135–145)
Total Bilirubin: 2.1 mg/dL — ABNORMAL HIGH (ref 0.3–1.2)
Total Protein: 5.9 g/dL — ABNORMAL LOW (ref 6.5–8.1)

## 2023-01-06 LAB — LACTIC ACID, PLASMA
Lactic Acid, Venous: 2.2 mmol/L (ref 0.5–1.9)
Lactic Acid, Venous: 2.1 mmol/L (ref 0.5–1.9)

## 2023-01-06 LAB — PROTIME-INR
INR: 1.3 — ABNORMAL HIGH (ref 0.8–1.2)
Prothrombin Time: 16.2 seconds — ABNORMAL HIGH (ref 11.4–15.2)

## 2023-01-06 MED ORDER — MAGNESIUM HYDROXIDE 400 MG/5ML PO SUSP: 30.0000 mL | Freq: Every day | ORAL | Status: DC | PRN

## 2023-01-06 MED ORDER — IOHEXOL 300 MG/ML  SOLN
100.0000 mL | Freq: Once | INTRAMUSCULAR | Status: AC | PRN
  Administered 2023-01-06: 100 mL via INTRAVENOUS

## 2023-01-06 MED ORDER — FENTANYL 50 MCG/HR TD PT72: 1.0000 | MEDICATED_PATCH | TRANSDERMAL | Status: DC

## 2023-01-06 MED ORDER — SODIUM CHLORIDE 0.9 % IV SOLN
2.0000 g | Freq: Once | INTRAVENOUS | Status: AC
  Administered 2023-01-06: 2 g via INTRAVENOUS
  Filled 2023-01-06: qty 12.5

## 2023-01-06 MED ORDER — SODIUM CHLORIDE 0.9 % IV SOLN
500.0000 mg | INTRAVENOUS | Status: DC
  Administered 2023-01-07 – 2023-01-08 (×2): 500 mg via INTRAVENOUS
  Filled 2023-01-06 (×2): qty 5

## 2023-01-06 MED ORDER — VANCOMYCIN HCL 1500 MG/300ML IV SOLN
1500.0000 mg | Freq: Once | INTRAVENOUS | Status: AC
  Administered 2023-01-06: 1500 mg via INTRAVENOUS
  Filled 2023-01-06: qty 300

## 2023-01-06 MED ORDER — GABAPENTIN 100 MG PO CAPS
100.0000 mg | ORAL_CAPSULE | Freq: Three times a day (TID) | ORAL | Status: DC
  Administered 2023-01-07 – 2023-01-10 (×11): 100 mg via ORAL
  Filled 2023-01-06 (×11): qty 1

## 2023-01-06 MED ORDER — LACTATED RINGERS IV BOLUS (SEPSIS)
1000.0000 mL | Freq: Once | INTRAVENOUS | Status: AC
  Administered 2023-01-06: 1000 mL via INTRAVENOUS

## 2023-01-06 MED ORDER — TRAZODONE HCL 50 MG PO TABS: 25.0000 mg | ORAL_TABLET | Freq: Every evening | ORAL | Status: DC | PRN

## 2023-01-06 MED ORDER — SODIUM CHLORIDE 0.9 % IV SOLN
2.0000 g | INTRAVENOUS | Status: DC
  Administered 2023-01-07 – 2023-01-09 (×3): 2 g via INTRAVENOUS
  Filled 2023-01-06 (×2): qty 2
  Filled 2023-01-06: qty 20

## 2023-01-06 MED ORDER — ENOXAPARIN SODIUM 40 MG/0.4ML IJ SOSY
40.0000 mg | PREFILLED_SYRINGE | INTRAMUSCULAR | Status: DC
  Administered 2023-01-07 – 2023-01-10 (×4): 40 mg via SUBCUTANEOUS
  Filled 2023-01-06 (×4): qty 0.4

## 2023-01-06 MED ORDER — ENSURE ENLIVE PO LIQD
237.0000 mL | Freq: Three times a day (TID) | ORAL | Status: DC
  Administered 2023-01-07 – 2023-01-10 (×9): 237 mL via ORAL

## 2023-01-06 MED ORDER — POLYETHYLENE GLYCOL 3350 17 G PO PACK
17.0000 g | PACK | Freq: Every day | ORAL | Status: DC
  Filled 2023-01-06 (×4): qty 1

## 2023-01-06 MED ORDER — ADULT MULTIVITAMIN W/MINERALS CH
1.0000 | ORAL_TABLET | Freq: Every day | ORAL | Status: DC
  Administered 2023-01-07 – 2023-01-10 (×4): 1 via ORAL
  Filled 2023-01-06 (×4): qty 1

## 2023-01-06 MED ORDER — SOTALOL HCL 80 MG PO TABS
80.0000 mg | ORAL_TABLET | Freq: Every day | ORAL | Status: DC
  Administered 2023-01-07 – 2023-01-10 (×4): 80 mg via ORAL
  Filled 2023-01-06 (×4): qty 1

## 2023-01-06 MED ORDER — PANTOPRAZOLE SODIUM 40 MG PO TBEC
40.0000 mg | DELAYED_RELEASE_TABLET | Freq: Every day | ORAL | Status: DC
  Administered 2023-01-07 – 2023-01-10 (×4): 40 mg via ORAL
  Filled 2023-01-06 (×4): qty 1

## 2023-01-06 MED ORDER — ONDANSETRON HCL 4 MG/2ML IJ SOLN: 4.0000 mg | Freq: Four times a day (QID) | INTRAMUSCULAR | Status: DC | PRN

## 2023-01-06 MED ORDER — NICOTINE 21 MG/24HR TD PT24
21.0000 mg | MEDICATED_PATCH | Freq: Every day | TRANSDERMAL | Status: DC
  Administered 2023-01-07 – 2023-01-10 (×5): 21 mg via TRANSDERMAL
  Filled 2023-01-06 (×5): qty 1

## 2023-01-06 MED ORDER — ATORVASTATIN CALCIUM 20 MG PO TABS
40.0000 mg | ORAL_TABLET | Freq: Every day | ORAL | Status: DC
  Administered 2023-01-07 – 2023-01-10 (×4): 40 mg via ORAL
  Filled 2023-01-06 (×4): qty 2

## 2023-01-06 MED ORDER — ACETAMINOPHEN 325 MG PO TABS
650.0000 mg | ORAL_TABLET | Freq: Four times a day (QID) | ORAL | Status: DC | PRN
  Filled 2023-01-06: qty 2

## 2023-01-06 MED ORDER — DEXAMETHASONE 4 MG PO TABS: 2.0000 mg | ORAL_TABLET | Freq: Two times a day (BID) | ORAL | Status: DC | PRN

## 2023-01-06 MED ORDER — OXYCODONE HCL 5 MG PO TABS
10.0000 mg | ORAL_TABLET | Freq: Four times a day (QID) | ORAL | Status: DC | PRN
  Administered 2023-01-07 – 2023-01-09 (×3): 10 mg via ORAL
  Filled 2023-01-06 (×3): qty 2

## 2023-01-06 MED ORDER — ACETAMINOPHEN 650 MG RE SUPP: 650.0000 mg | Freq: Four times a day (QID) | RECTAL | Status: DC | PRN

## 2023-01-06 MED ORDER — POTASSIUM CHLORIDE IN NACL 20-0.9 MEQ/L-% IV SOLN
INTRAVENOUS | Status: DC
  Filled 2023-01-06 (×10): qty 1000

## 2023-01-06 MED ORDER — PROCHLORPERAZINE MALEATE 10 MG PO TABS: 10.0000 mg | ORAL_TABLET | Freq: Four times a day (QID) | ORAL | Status: DC | PRN

## 2023-01-06 MED ORDER — THIAMINE HCL 100 MG PO TABS
100.0000 mg | ORAL_TABLET | Freq: Every day | ORAL | Status: DC
  Administered 2023-01-07 – 2023-01-10 (×4): 100 mg via ORAL
  Filled 2023-01-06 (×7): qty 1

## 2023-01-06 MED ORDER — POTASSIUM CHLORIDE CRYS ER 20 MEQ PO TBCR: 20.0000 meq | EXTENDED_RELEASE_TABLET | Freq: Every day | ORAL | Status: DC

## 2023-01-06 MED ORDER — SODIUM CHLORIDE 0.9 % IV BOLUS
1000.0000 mL | Freq: Once | INTRAVENOUS | Status: AC
  Administered 2023-01-06: 1000 mL via INTRAVENOUS

## 2023-01-06 MED ORDER — MULTI-VITAMIN PO TABS: 1.0000 | ORAL_TABLET | Freq: Every day | ORAL | Status: DC

## 2023-01-06 MED ORDER — ALPRAZOLAM 0.25 MG PO TABS: 0.2500 mg | ORAL_TABLET | Freq: Every evening | ORAL | Status: DC | PRN

## 2023-01-06 MED ORDER — ONDANSETRON HCL 4 MG PO TABS: 4.0000 mg | ORAL_TABLET | Freq: Four times a day (QID) | ORAL | Status: DC | PRN

## 2023-01-06 MED ORDER — METRONIDAZOLE 500 MG/100ML IV SOLN
500.0000 mg | Freq: Once | INTRAVENOUS | Status: AC
  Administered 2023-01-06: 500 mg via INTRAVENOUS
  Filled 2023-01-06: qty 100

## 2023-01-06 NOTE — H&P (Incomplete)
[place]   PATIENT NAME: Kyle Larson    MR#:  [id number]  DATE OF BIRTH:  11/11/1956  DATE OF ADMISSION:  01/06/2023  PRIMARY CARE PHYSICIAN: Kathrine Haddock, NP   Patient is coming from: Home  REQUESTING/REFERRING PHYSICIAN: Lucillie Garfinkel, MD  CHIEF COMPLAINT:   Chief Complaint  Patient presents with   Weakness    HISTORY OF PRESENT ILLNESS:  Kyle Larson is a 67 y.o. Caucasian male with medical history significant for rectal cancer on palliative chemoradiotherapy, coronary artery disease, CVA, and tobacco abuse, who presented to the emergency room with acute onset of generalized weakness and generalized abdominal pain.  Patient admitted to fever and chills.  He has been having cough with inability to expectorate as well as dyspnea and wheezing.  No nausea or vomiting or diarrhea.  No melena or bright red blood per rectum.  He denies any chest pain or palpitations.  No dysuria, oliguria or hematuria or flank pain.  ED Course: When the patient came to the ER, heart rate was 108 with respiratory to 31 and pulse currently 95% on 2 L of O2 by nasal cannula with otherwise normal vital signs.  Labs revealed hyponatremia and hypochloremia and hypokalemia albumin of 2.6 and total bili of 5.9 with total bili of 2.1.  High sensitive troponin I was 11 and later 27.  Lactic acid was 2.2 and later 2.1.  CBC showed leukopenia of 0.3 and anemia slightly worse than previous levels with platelets of 61 show pancytopenia.  INR is 1.3 and PT 16.2 with PTT of 30.  Influenza, RSV and COVID-19 PCR's came back negative.  UA was negative.  Blood cultures were drawn.  Urine culture was sent.   EKG as reviewed by me : Showed sinus tachycardia with a rate of 100 and PACs, PVCs and borderline right axis deviation. Imaging: Chest past, abdominal and pelvic CT scan with contrast revealed the following: 1. Extensive bibasilar airway impaction and consolidation within the posterior basal lower lobes  bilaterally, left greater than right, in keeping with changes of aspiration or multifocal infection. Superimposed bronchial wall thickening in keeping with airway inflammation. 2. Moderate emphysema. 3. Moderate multi-vessel coronary artery calcification. 4. Centrally necrotic rectal mass again identified with extensive local invasion of the presacral space and left posterolateral pelvic sidewall. Stable mesorectal lymph node metastasis. Developing interstitial gas within the primary mass may reflect changes of treatment effect and increasing necrosis and/or superimposed infection. 5. Lower quadrant diverting sigmoid loop colostomy with prominent stomal prolapse and partial large bowel obstruction with point of transition at the stomal hiatus. Moderate retained stool proximally within the colon. 6. Interval enlargement of a single pulmonary nodule along the left major fissure, indeterminate. An isolated pulmonary metastasis is of low likelihood, but not completely excluded. Close attention on a follow-up surveillance imaging is warranted.  Portable chest x-ray showed interval right-sided venous Port-A-Cath placement without evidence for pneumothorax.  The patient was given IV cefepime, vancomycin and Flagyl, 1 L bolus of IV lactated Ringer and 1 L of IV normal saline.  He will be admitted to a medical telemetry bed for further evaluation and management. PAST MEDICAL HISTORY:   Past Medical History:  Diagnosis Date   AC joint dislocation    CAD (coronary artery disease)    CVA (cerebral vascular accident) ([place])    Metatarsal bone fracture    3rd metatarsal , left foot; April 2021   Syncope    June '22   Tobacco  abuse     PAST SURGICAL HISTORY:   Past Surgical History:  Procedure Laterality Date   ACROMIO-CLAVICULAR JOINT REPAIR Left 09/11/2016   Procedure: ACROMIO-CLAVICULAR RECONSTRUCTION;  Surgeon: Corky Mull, MD;  Location: [facility];  Service: Orthopedics;  Laterality:  Left;  clavicle   FLEXIBLE SIGMOIDOSCOPY N/A 02/24/2022   Procedure: FLEXIBLE SIGMOIDOSCOPY;  Surgeon: Lesly Rubenstein, MD;  Location: [facility];  Service: Endoscopy;  Laterality: N/A;   IR IMAGING GUIDED PORT INSERTION  03/10/2022   LEFT HEART CATH AND CORONARY ANGIOGRAPHY N/A 05/20/2021   Procedure: LEFT HEART CATH AND CORONARY ANGIOGRAPHY with coronary intervention;  Surgeon: [name], MD;  Location: [place] [facility];  Service: Cardiovascular;  Laterality: N/A;   MANDIBLE RECONSTRUCTION     TONSILLECTOMY     AGE 21   XI ROBOTIC ASSISTED INGUINAL HERNIA REPAIR WITH MESH Right 03/26/2020   Procedure: XI ROBOTIC ASSISTED INGUINAL HERNIA REPAIR WITH MESH;  Surgeon: Herbert Pun, MD;  Location: [facility];  Service: General;  Laterality: Right;    SOCIAL HISTORY:   Social History   Tobacco Use   Smoking status: Some Days    Packs/day: 0.25    Years: 15.00    Total pack years: 3.75    Types: Cigarettes    Passive exposure: Current   Smokeless tobacco: Never  Substance Use Topics   Alcohol use: Yes    Alcohol/week: 42.0 standard drinks of alcohol    Types: 42 Cans of beer per week    Comment: 3 every other day    FAMILY HISTORY:   Family History  Problem Relation Age of Onset   Diabetes Mother    Prostate cancer Father     DRUG ALLERGIES:  No Known Allergies  REVIEW OF SYSTEMS:   ROS As per history of present illness. All pertinent systems were reviewed above. Constitutional, HEENT, cardiovascular, respiratory, GI, GU, musculoskeletal, neuro, psychiatric, endocrine, integumentary and hematologic systems were reviewed and are otherwise negative/unremarkable except for positive findings mentioned above in the HPI.   MEDICATIONS AT HOME:   Prior to Admission medications   Medication Sig Start Date End Date Taking? Authorizing Provider  acetaminophen (TYLENOL) 325 MG tablet Take 2 tablets (650 mg total) by mouth every 6 (six) hours as needed for mild  pain. 02/25/22  Yes Wieting, Meagan, MD  ALPRAZolam Duanne Moron) 0.25 MG tablet Take 1 tablet (0.25 mg total) by mouth at bedtime as needed for sleep. 12/01/22  Yes Borders, Kirt Boys, NP  atorvastatin (LIPITOR) 40 MG tablet Take 1 tablet (40 mg total) by mouth daily. 02/25/22  Yes Wieting, Verlyn, MD  dexamethasone (DECADRON) 2 MG tablet Take 1 tablet (2 mg total) by mouth 2 (two) times daily as needed. for appetite and nausea. 12/12/22  Yes Lloyd Huger, MD  fentaNYL (DURAGESIC) 50 MCG/HR Place 1 patch onto the skin every 3 (three) days. 12/30/22  Yes Lloyd Huger, MD  gabapentin (NEURONTIN) 100 MG capsule Take 1 capsule (100 mg total) by mouth 3 (three) times daily. 12/25/22  Yes Borders, Kirt Boys, NP  lidocaine-prilocaine (EMLA) cream Apply 1 application  topically as needed. Apply cream to port 1 hour prior to chemotherapy. Cover with plastic wrap. 05/07/22  Yes Lloyd Huger, MD  loperamide (IMODIUM) 2 MG capsule Take 2 tabs by mouth with first loose stool, then 1 tab with each additional loose stool as needed. Do not exceed 8 tabs in a 24-hour period 12/05/22  Yes Finnegan, Kathlene November, MD  Multiple  Vitamin (MULTI-VITAMIN) tablet Take by mouth.   Yes [provider]  ondansetron (ZOFRAN) 8 MG tablet TAKE 1 TABLET 2 TIMES DAILY AS NEEDED FOR REFRACTORY NAUSEA / VOMITING. START DAY 3 AFTER CHEMO 03/05/22  Yes Finnegan, Kathlene November, MD  Oxycodone HCl 10 MG TABS Take 1 tablet (10 mg total) by mouth every 6 (six) hours as needed. Okay to take 1/2 tablet as needed. 12/31/22  Yes Lloyd Huger, MD  pantoprazole (PROTONIX) 40 MG tablet Take 1 tablet (40 mg total) by mouth daily. 11/26/22  Yes Borders, Kirt Boys, NP  polyethylene glycol (MIRALAX / GLYCOLAX) 17 g packet Take 17 g by mouth daily.   Yes [provider]  potassium chloride SA (KLOR-CON M) 20 MEQ tablet TAKE 1 TABLET(20 MEQ) BY MOUTH DAILY 12/17/22  Yes Lloyd Huger, MD  prochlorperazine (COMPAZINE) 10 MG tablet Take 1  tablet (10 mg total) by mouth every 6 (six) hours as needed (Nausea or vomiting). 03/04/22  Yes Lloyd Huger, MD  sotalol (BETAPACE) 80 MG tablet Take 80 mg by mouth daily. 03/10/22  Yes [provider]  tamsulosin (FLOMAX) 0.4 MG CAPS capsule Take 2 capsules (0.8 mg total) by mouth daily. Take one in the morning and one at night. 09/25/22  Yes Vaillancourt, Samantha, PA-C  thiamine 100 MG tablet Take 1 tablet (100 mg total) by mouth daily. 02/25/22  Yes Wieting, Juda, MD  feeding supplement (ENSURE ENLIVE / ENSURE PLUS) LIQD Take 237 mLs by mouth 3 (three) times daily between meals. Patient not taking: Reported on 12/05/2022 02/25/22   Loletha Grayer, MD  lidocaine-prilocaine (EMLA) cream Apply to affected area once 12/05/22   Lloyd Huger, MD  loperamide (IMODIUM A-D) 2 MG tablet Take 2 mg by mouth 4 (four) times daily as needed for diarrhea or loose stools. Patient not taking: Reported on 11/06/2022    [provider]  Multiple Vitamins-Minerals (MULTIVITAMIN WITH MINERALS) tablet Take 1 tablet by mouth daily.    [provider]  nicotine (NICODERM CQ - DOSED IN MG/24 HOURS) 21 mg/24hr patch Place 21 mg onto the skin daily. Patient not taking: Reported on 11/06/2022 02/14/22   [provider]  sildenafil (VIAGRA) 25 MG tablet Take 1 tablet (25 mg total) by mouth daily as needed for erectile dysfunction. 07/09/22   Borders, Kirt Boys, NP      VITAL SIGNS:  Blood pressure 138/82, pulse 80, temperature (!) 97.5 F (36.4 C), temperature source Oral, resp. rate (!) 25, weight 62.1 kg, SpO2 93 %.  PHYSICAL EXAMINATION:  Physical Exam  GENERAL:  67 y.o.-year-old Caucasian male patient lying in the bed with no acute distress.  EYES: Pupils equal, round, reactive to light and accommodation. No scleral icterus. Extraocular muscles intact.  HEENT: Head atraumatic, normocephalic. Oropharynx with .and nasopharynx clear.  NECK:  Supple, no jugular venous  distention. No thyroid enlargement, no tenderness.  LUNGS: Normal breath sounds bilaterally, no wheezing, rales,rhonchi or crepitation. No use of accessory muscles of respiration.  CARDIOVASCULAR: Regular rate and rhythm, S1, S2 normal. No murmurs, rubs, or gallops.  ABDOMEN: Soft, nondistended, with generalized tenderness without rebound tenderness guarding or rigidity.  Bowel sounds diminished.  Left colostomy in place with herniation. EXTREMITIES: No pedal edema, cyanosis, or clubbing.  NEUROLOGIC: Cranial nerves II through XII are intact. Muscle strength 5/5 in all extremities. Sensation intact. Gait not checked.  PSYCHIATRIC: The patient is alert and oriented x 3.  Normal affect and good eye contact. SKIN: No obvious  rash, lesion, or ulcer.   LABORATORY PANEL:   CBC Recent Labs  Lab 01/06/23 1934  WBC 0.3*  HGB 12.8*  HCT 35.6*  PLT 61*   ------------------------------------------------------------------------------------------------------------------  Chemistries  Recent Labs  Lab 01/06/23 1934  NA 129*  K 3.2*  CL 92*  CO2 25  GLUCOSE 88  BUN 16  CREATININE 0.64  CALCIUM 7.8*  AST 29  ALT 20  ALKPHOS 96  BILITOT 2.1*   ------------------------------------------------------------------------------------------------------------------  Cardiac Enzymes No results for input(s): "TROPONINI" in the last 168 hours. ------------------------------------------------------------------------------------------------------------------  RADIOLOGY:  CT CHEST ABDOMEN PELVIS W CONTRAST  Result Date: 01/06/2023 CLINICAL DATA:  Sepsis EXAM: CT CHEST, ABDOMEN, AND PELVIS WITH CONTRAST TECHNIQUE: Multidetector CT imaging of the chest, abdomen and pelvis was performed following the standard protocol during bolus administration of intravenous contrast. RADIATION DOSE REDUCTION: This exam was performed according to the departmental dose-optimization program which includes automated  exposure control, adjustment of the mA and/or kV according to patient size and/or use of iterative reconstruction technique. CONTRAST:  195m OMNIPAQUE IOHEXOL 300 MG/ML  SOLN COMPARISON:  10/10/2022 FINDINGS: CT CHEST FINDINGS Cardiovascular: Moderate multi-vessel coronary artery calcification. Global cardiac size within normal limits. No pericardial effusion. Central pulmonary arteries are of normal caliber. No intraluminal filling defect through the segmental level to suggest acute pulmonary embolism. Mild atherosclerotic calcification within the thoracic aorta. No aortic aneurysm. Right internal jugular chest port tip noted within the superior vena cava. Mediastinum/Nodes: No pathologic thoracic adenopathy. Esophagus unremarkable. Visualized thyroid is unremarkable. Small hiatal hernia. Lungs/Pleura: Moderate emphysema. Extensive bibasilar airway impaction and consolidation within the posterior basal lower lobes bilaterally, left greater than right in keeping with changes of aspiration or multifocal infection. Superimposed bronchial wall thickening in keeping with airway inflammation. No pneumothorax. No pleural effusion. Interval enlargement of a single pulmonary nodule along the left major fissure, axial image # 96/4, measuring 5 mm (previously measuring 3 mm), indeterminate. An isolated pulmonary metastasis is of low likelihood, but not completely excluded. Musculoskeletal: Multiple remote left rib fractures noted. No acute bone abnormality. No lytic or blastic bone lesion within the thorax. CT ABDOMEN PELVIS FINDINGS Hepatobiliary: Stable tiny hypodensity within the left hepatic lobe most in keeping with a tiny hepatic cyst. Liver otherwise unremarkable. Gallbladder unremarkable. No intra or extrahepatic biliary ductal dilation. Pancreas: Unremarkable. No pancreatic ductal dilatation or surrounding inflammatory changes. Spleen: Normal in size without focal abnormality. Adrenals/Urinary Tract: The adrenal  glands are unremarkable. The kidneys are normal. The bladder is mildly distended, but is otherwise unremarkable. Stomach/Bowel: Centrally necrotic rectal mass is again identified now with interstitial gas which may reflect changes of treatment effect or superimposed infection. The mass is again seen extending into the presacral space and abutting the left posterolateral pelvic sidewall in keeping with extensive local invasion. Lower quadrant diverting sigmoid loop colostomy is again identified with prominent stomal prolapse and partial large bowel obstruction with point of transition at the stomal hiatus at axial image # 106/2 with moderate retained stool proximally within the colon. The stomach, small bowel, and large bowel are otherwise unremarkable. No free intraperitoneal gas or fluid. Vascular/Lymphatic: Moderate aortoiliac atherosclerotic calcification. No aortic aneurysm. Previously noted mesorectal lymph node metastasis is again identified at axial image # 102/2 and is stable. No new pathologic adenopathy. Reproductive: Rectal mass abuts the left seminal vesicle, unchanged from prior examination and the intervening flat pain appears obliterated, unchanged suggesting possible invasion of the structure. The prostate gland is unremarkable. Other: None Musculoskeletal: No acute bone abnormality.  No lytic or blastic bone lesion. IMPRESSION: 1. Extensive bibasilar airway impaction and consolidation within the posterior basal lower lobes bilaterally, left greater than right, in keeping with changes of aspiration or multifocal infection. Superimposed bronchial wall thickening in keeping with airway inflammation. 2. Moderate emphysema. 3. Moderate multi-vessel coronary artery calcification. 4. Centrally necrotic rectal mass again identified with extensive local invasion of the presacral space and left posterolateral pelvic sidewall. Stable mesorectal lymph node metastasis. Developing interstitial gas within the  primary mass may reflect changes of treatment effect and increasing necrosis and/or superimposed infection. 5. Lower quadrant diverting sigmoid loop colostomy with prominent stomal prolapse and partial large bowel obstruction with point of transition at the stomal hiatus. Moderate retained stool proximally within the colon. 6. Interval enlargement of a single pulmonary nodule along the left major fissure, indeterminate. An isolated pulmonary metastasis is of low likelihood, but not completely excluded. Close attention on a follow-up surveillance imaging is warranted. Aortic Atherosclerosis (ICD10-I70.0) and Emphysema (ICD10-J43.9). Electronically Signed   By: Fidela Salisbury M.D.   On: 01/06/2023 21:46   DG Chest Port 1 View  Result Date: 01/06/2023 CLINICAL DATA:  Possible sepsis. EXAM: PORTABLE CHEST 1 VIEW COMPARISON:  February 23, 2022 FINDINGS: Since the prior study there is been interval placement of a right-sided venous Port-A-Cath. Its distal tip is seen at the junction of the superior vena cava and right atrium. The heart size and mediastinal contours are within normal limits. Mild areas of atelectasis and/or infiltrate are seen within the bilateral lung bases. There is no evidence of a pleural effusion or pneumothorax. Multiple chronic left-sided rib fractures are noted, with a radiopaque fixation plate and screws seen overlying the left acromion and distal left clavicle. IMPRESSION: Interval right-sided venous Port-A-Cath placement positioning, as described above, without evidence of pneumothorax. Electronically Signed   By: Virgina Norfolk M.D.   On: 01/06/2023 20:30      IMPRESSION AND PLAN:  Assessment and Plan: * Sepsis due to pneumonia [facility]) - This is due to multifocal pneumonia. - Sepsis manifested by leukopenia, tachycardia and tachypnea. - The patient will be admitted to a medical telemetry bed. - We will continue antibiotic therapy with IV Rocephin and Zithromax and add IV vancomycin  being MRSA risk - Mucolytic therapy and bronchodilator therapy will be provided. - We will follow blood cultures. - We will continue hydration with IV normal saline with added potassium chloride. - O2 protocol for associated acute respiratory failure with hypoxia.  Large bowel obstruction (HCC) - This seems to be partial and he has a transition point. - General surgery consult will be obtained. - The patient will be kept NPO. - We will continue hydration with IV normal saline with added potassium chloride. - General surgery consult will be obtained. - I notified Dr. 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 - The patient is undergoing palliative chemo and radiotherapy with associated pancytopenia.  Hypokalemia - Potassium will be replaced and magnesium will be checked.  Hyponatremia This is likely hypovolemic due to volume depletion and dehydration.  BPH (benign prostatic hyperplasia) - We will continue Flomax.  Dyslipidemia - We will continue statin therapy.       DVT prophylaxis: Lovenox.  Advanced Care Planning:  Code Status: full code.  This was discussed with him. Family Communication:  The plan of care was discussed in details with the patient (and family). I answered all questions. The patient agreed to proceed with the  above mentioned plan. Further management will depend upon hospital course. Disposition Plan: Back to previous home environment Consults called: General surgery. All the records are reviewed and case discussed with ED provider.  Status is: Inpatient    At the time of the admission, it appears that the appropriate admission status for this patient is inpatient.  This is judged to be reasonable and necessary in order to provide the required intensity of service to ensure the patient's safety given the presenting symptoms, physical exam findings and initial radiographic and laboratory data in the  context of comorbid conditions.  The patient requires inpatient status due to high intensity of service, high risk of further deterioration and high frequency of surveillance required.  I certify that at the time of admission, it is my clinical judgment that the patient will require inpatient hospital care extending more than 2 midnights.                            Dispo: The patient is from: Home              Anticipated d/c is to: Home              Patient currently is not medically stable to d/c.              Difficult to place patient: No  Christel Mormon M.D on 01/07/2023 at 1:15 AM  [facility]   From 7 PM-7 AM, contact night-coverage [email or web address]  CC: Primary care physician; Kathrine Haddock, NP

## 2023-01-06 NOTE — ED Provider Notes (Addendum)
[facility] Provider Note    Event Date/Time   First MD Initiated Contact with Patient 01/06/23 1922     (approximate)   History   Weakness   HPI  Kyle Larson is a 67 y.o. male   Past medical history of rectal cancer on palliative chemoradiation therapy who presents to the emergency department with lethargy, chills, cough, abdominal pain.  Last chemotherapy session was last week.  Symptoms for several days.  Patient is ill-appearing, shivering in bed.  He has no pain at this time.  Initial vital signs were concerning for tachycardia 108 and increased respiratory rate of 31.  Independent Historian contributed to assessment above: EMS  External Medical Documents Reviewed: Oncology note from February 2024      Physical Exam   Triage Vital Signs: ED Triage Vitals  Enc Vitals Group     BP      Pulse      Resp      Temp      Temp src      SpO2      Weight      Height      Head Circumference      Peak Flow      Pain Score      Pain Loc      Pain Edu?      Excl. in [place]?     Most recent vital signs: Vitals:   01/06/23 2100 01/06/23 2200  BP: (!) 144/77 133/87  Pulse: 90 82  Resp: (!) 26 19  Temp:    SpO2: 96% 96%    General: Awake, ill-appearing cachectic CV:  Mucous membranes dry and skin turgor is poor looks clinically dehydrated Resp:  Increased respiratory rate with no obvious wheezing or rales or focality Abd:  No distention.  No guarding or rigidity, mild tenderness to palpation diffusely though patient states this is chronic with his cancer Other:  Catheter site on the right chest does not appear overtly infected.  He has ostomy bag in the left lower abdomen   ED Results / Procedures / Treatments   Labs (all labs ordered are listed, but only abnormal results are displayed) Labs Reviewed  LACTIC ACID, PLASMA - Abnormal; Notable for the following components:      Result Value   Lactic Acid, Venous 2.2 (*)    All  other components within normal limits  COMPREHENSIVE METABOLIC PANEL - Abnormal; Notable for the following components:   Sodium 129 (*)    Potassium 3.2 (*)    Chloride 92 (*)    Calcium 7.8 (*)    Total Protein 5.9 (*)    Albumin 2.6 (*)    Total Bilirubin 2.1 (*)    All other components within normal limits  CBC WITH DIFFERENTIAL/PLATELET - Abnormal; Notable for the following components:   WBC 0.3 (*)    RBC 3.93 (*)    Hemoglobin 12.8 (*)    HCT 35.6 (*)    RDW 17.1 (*)    Platelets 61 (*)    Neutro Abs 0.0 (*)    Lymphs Abs 0.2 (*)    Monocytes Absolute 0.0 (*)    All other components within normal limits  PROTIME-INR - Abnormal; Notable for the following components:   Prothrombin Time 16.2 (*)    INR 1.3 (*)    All other components within normal limits  RESP PANEL BY RT-PCR (RSV, FLU A&B, COVID)  [id number]  CULTURE, BLOOD (ROUTINE X 2)  CULTURE, BLOOD (ROUTINE X 2)  URINE CULTURE  APTT  LACTIC ACID, PLASMA  URINALYSIS, COMPLETE (UACMP) WITH MICROSCOPIC  PATHOLOGIST SMEAR REVIEW  TROPONIN I (HIGH SENSITIVITY)  TROPONIN I (HIGH SENSITIVITY)     I ordered and reviewed the above labs they are notable for lactic acidosis of 2.2 and a white blood cell count markedly decreased at 0.3 and 0 absolute neutrophil count  EKG  ED ECG REPORT I, Lucillie Garfinkel, the attending physician, personally viewed and interpreted this ECG.   Date: 01/06/2023  EKG Time: 1937  Rate: 100  Rhythm: sinus tach  Axis: nl  Intervals:pvc's   ST&T Change: no stemi    RADIOLOGY I independently reviewed and interpreted chest x-ray and see no obvious focality or pneumothorax   PROCEDURES:  Critical Care performed: Yes, see critical care procedure note(s)  .Critical Care  Performed by: Lucillie Garfinkel, MD Authorized by: Lucillie Garfinkel, MD   Critical care provider statement:    Critical care time (minutes):  30   Critical care was necessary to treat or prevent imminent or life-threatening  deterioration of the following conditions:  Sepsis   Critical care was time spent personally by me on the following activities:  Development of treatment plan with patient or surrogate, discussions with consultants, evaluation of patient's response to treatment, examination of patient, ordering and review of laboratory studies, ordering and review of radiographic studies, ordering and performing treatments and interventions, pulse oximetry, re-evaluation of patient's condition and review of old charts    MEDICATIONS ORDERED IN ED: Medications  vancomycin (VANCOREADY) IVPB 1500 mg/300 mL (0 mg Intravenous Stopped 01/06/23 2245)  lactated ringers bolus 1,000 mL (0 mLs Intravenous Stopped 01/06/23 2051)  ceFEPIme (MAXIPIME) 2 g in sodium chloride 0.9 % 100 mL IVPB (0 g Intravenous Stopped 01/06/23 2017)  metroNIDAZOLE (FLAGYL) IVPB 500 mg (0 mg Intravenous Stopped 01/06/23 2114)  sodium chloride 0.9 % bolus 1,000 mL (0 mLs Intravenous Stopped 01/06/23 2200)  iohexol (OMNIPAQUE) 300 MG/ML solution 100 mL (100 mLs Intravenous Contrast Given 01/06/23 2118)    External physician / consultants:  I spoke with hospitalist for admission & regarding care plan for this patient.   IMPRESSION / MDM / ASSESSMENT AND PLAN / ED COURSE  I reviewed the triage vital signs and the nursing notes.                                Patient's presentation is most consistent with acute presentation with potential threat to life or bodily function.  Differential diagnosis includes, but is not limited to, sepsis, neutropenia, intra-abdominal infection, lung infection, viral URI, urinary tract infection, intra-abdominal infection, progression of disease cancer, electrolyte derangements or dehydration   The patient is on the cardiac monitor to evaluate for evidence of arrhythmia and/or significant heart rate changes.  MDM: Patient is neutropenic with concern for fever tachycardia and increased respiratory rate with lactic  acidosis.  Given sepsis bolus of IV crystalloid and broad-spectrum antibiotics vancomycin, cefepime, and Flagyl at the outset while determining infectious source.  Patient's vital signs improved after initial IV fluids with blood pressure 138/77 and pulse rate normalizing to 89, respiratory rate decreased to 23.   Chest x-ray is clear.  Awaiting UA.  Given tenderness to the abdomen, and normal creatinine, will obtain a CT chest abdomen and pelvis with IV contrast to further elucidate a source, check for intra-abdominal infection.  He will need to be admitted.  CT scan shows evidence of multifocal pneumonia.  There is also questionable infection around the rectal mass and large bowel obstruction.  I consulted with Dr. Adora Fridge of general surgery who will evaluate the patient while inpatient.  He is already on broad-spectrum antibiotics.  His vital signs have improved and his cognition/mental status has improved as well.  I spoke about this diagnosis and plan for admission with him and his wife both are in agreement.  They are aware of his poor prognosis, but want to remain full code at this time.      FINAL CLINICAL IMPRESSION(S) / ED DIAGNOSES   Final diagnoses:  Sepsis, due to unspecified organism, unspecified whether acute organ dysfunction present ([place])  Chemotherapy-induced neutropenia ([place])     Rx / DC Orders   ED Discharge Orders     None        Note:  This document was prepared using Dragon voice recognition software and may include unintentional dictation errors.    Lucillie Garfinkel, MD 01/06/23 2123    Lucillie Garfinkel, MD 01/06/23 2220

## 2023-01-06 NOTE — Consult Note (Signed)
PHARMACY -  BRIEF ANTIBIOTIC NOTE   Pharmacy has received consult(s) for vancomycin from an ED provider.  The patient's profile has been reviewed for ht/wt/allergies/indication/available labs.    One time order(s) placed for vancomycin 1554m IV x1  Further antibiotics/pharmacy consults should be ordered by admitting physician if indicated.                       Thank you, ADarrick Penna2/13/2024  7:58 PM

## 2023-01-07 ENCOUNTER — Inpatient Hospital Stay: Payer: Medicare Other

## 2023-01-07 ENCOUNTER — Other Ambulatory Visit: Payer: Self-pay

## 2023-01-07 ENCOUNTER — Encounter: Payer: Self-pay | Admitting: Family Medicine

## 2023-01-07 DIAGNOSIS — C20 Malignant neoplasm of rectum: Secondary | ICD-10-CM | POA: Diagnosis not present

## 2023-01-07 DIAGNOSIS — K56609 Unspecified intestinal obstruction, unspecified as to partial versus complete obstruction: Secondary | ICD-10-CM

## 2023-01-07 DIAGNOSIS — D61818 Other pancytopenia: Secondary | ICD-10-CM

## 2023-01-07 DIAGNOSIS — T451X5A Adverse effect of antineoplastic and immunosuppressive drugs, initial encounter: Secondary | ICD-10-CM

## 2023-01-07 DIAGNOSIS — D701 Agranulocytosis secondary to cancer chemotherapy: Secondary | ICD-10-CM

## 2023-01-07 DIAGNOSIS — E785 Hyperlipidemia, unspecified: Secondary | ICD-10-CM | POA: Insufficient documentation

## 2023-01-07 DIAGNOSIS — J189 Pneumonia, unspecified organism: Secondary | ICD-10-CM | POA: Diagnosis not present

## 2023-01-07 DIAGNOSIS — A419 Sepsis, unspecified organism: Secondary | ICD-10-CM | POA: Diagnosis not present

## 2023-01-07 DIAGNOSIS — E876 Hypokalemia: Secondary | ICD-10-CM

## 2023-01-07 LAB — BLOOD CULTURE ID PANEL (REFLEXED) - BCID2

## 2023-01-07 LAB — HIV ANTIBODY (ROUTINE TESTING W REFLEX): HIV Screen 4th Generation wRfx: NONREACTIVE

## 2023-01-07 LAB — BASIC METABOLIC PANEL
Anion gap: 9 (ref 5–15)
BUN: 16 mg/dL (ref 8–23)
CO2: 25 mmol/L (ref 22–32)
Calcium: 7.6 mg/dL — ABNORMAL LOW (ref 8.9–10.3)
Chloride: 96 mmol/L — ABNORMAL LOW (ref 98–111)
Creatinine, Ser: 0.59 mg/dL — ABNORMAL LOW (ref 0.61–1.24)
GFR, Estimated: >60 mL/min (ref >60)
Glucose, Bld: 78 mg/dL (ref 70–99)
Potassium: 2.7 mmol/L — CL (ref 3.5–5.1)
Sodium: 130 mmol/L — ABNORMAL LOW (ref 135–145)

## 2023-01-07 LAB — PROTIME-INR
INR: 1.3 — ABNORMAL HIGH (ref 0.8–1.2)
Prothrombin Time: 16.3 seconds — ABNORMAL HIGH (ref 11.4–15.2)

## 2023-01-07 LAB — CBC
HCT: 33 % — ABNORMAL LOW (ref 39.0–52.0)
Hemoglobin: 11.4 g/dL — ABNORMAL LOW (ref 13.0–17.0)
MCH: 32.4 pg (ref 26.0–34.0)
MCHC: 34.5 g/dL (ref 30.0–36.0)
MCV: 93.8 fL (ref 80.0–100.0)
Platelets: 46 10*3/uL — ABNORMAL LOW (ref 150–400)
RBC: 3.52 MIL/uL — ABNORMAL LOW (ref 4.22–5.81)
RDW: 17.5 % — ABNORMAL HIGH (ref 11.5–15.5)
WBC: 0.2 10*3/uL — CL (ref 4.0–10.5)
nRBC: 0 % (ref 0.0–0.2)

## 2023-01-07 LAB — PATHOLOGIST SMEAR REVIEW

## 2023-01-07 LAB — PROCALCITONIN: Procalcitonin: 15.2 ng/mL

## 2023-01-07 LAB — MAGNESIUM: Magnesium: 1.8 mg/dL (ref 1.7–2.4)

## 2023-01-07 LAB — CORTISOL-AM, BLOOD: Cortisol - AM: 24 ug/dL — ABNORMAL HIGH (ref 6.7–22.6)

## 2023-01-07 MED ORDER — POTASSIUM CHLORIDE 20 MEQ PO PACK
40.0000 meq | PACK | Freq: Once | ORAL | Status: AC
  Administered 2023-01-07: 40 meq via ORAL
  Filled 2023-01-07: qty 2

## 2023-01-07 MED ORDER — POTASSIUM CHLORIDE CRYS ER 20 MEQ PO TBCR
40.0000 meq | EXTENDED_RELEASE_TABLET | Freq: Once | ORAL | Status: AC
  Administered 2023-01-07: 40 meq via ORAL
  Filled 2023-01-07: qty 2

## 2023-01-07 MED ORDER — POTASSIUM CHLORIDE 10 MEQ/100ML IV SOLN: 10.0000 meq | INTRAVENOUS | Status: DC

## 2023-01-07 MED ORDER — VANCOMYCIN HCL IN DEXTROSE 1-5 GM/200ML-% IV SOLN
1000.0000 mg | Freq: Two times a day (BID) | INTRAVENOUS | Status: DC
  Administered 2023-01-07: 1000 mg via INTRAVENOUS
  Filled 2023-01-07: qty 200

## 2023-01-07 MED ORDER — POTASSIUM CHLORIDE CRYS ER 20 MEQ PO TBCR
20.0000 meq | EXTENDED_RELEASE_TABLET | Freq: Every day | ORAL | Status: DC
  Administered 2023-01-08 – 2023-01-10 (×3): 20 meq via ORAL
  Filled 2023-01-07 (×3): qty 1

## 2023-01-07 MED ORDER — MAGNESIUM SULFATE 2 GM/50ML IV SOLN
2.0000 g | Freq: Once | INTRAVENOUS | Status: AC
  Administered 2023-01-07: 2 g via INTRAVENOUS
  Filled 2023-01-07: qty 50

## 2023-01-07 MED ORDER — VANCOMYCIN HCL 750 MG/150ML IV SOLN
750.0000 mg | Freq: Two times a day (BID) | INTRAVENOUS | Status: DC
  Administered 2023-01-07 – 2023-01-08 (×2): 750 mg via INTRAVENOUS
  Filled 2023-01-07 (×3): qty 150

## 2023-01-07 MED ORDER — FENTANYL 50 MCG/HR TD PT72
1.0000 | MEDICATED_PATCH | TRANSDERMAL | Status: DC
  Administered 2023-01-07 – 2023-01-10 (×2): 1 via TRANSDERMAL
  Filled 2023-01-07 (×2): qty 1

## 2023-01-07 MED ORDER — MORPHINE SULFATE (PF) 2 MG/ML IV SOLN: 2.0000 mg | INTRAVENOUS | Status: DC | PRN

## 2023-01-07 NOTE — Assessment & Plan Note (Signed)
-   We will continue Flomax. 

## 2023-01-07 NOTE — Progress Notes (Signed)
Pharmacy Antibiotic Note  Kyle Larson is a 67 y.o. male admitted on 01/06/2023 with sepsis.  Pharmacy has been consulted for Vancomycin dosing.  Plan: Vancomycin 1500 mg IV X 1 given on 2/13 @ 2043. Vancomycin 1 gm IV Q12H ordered to start on 2/14 @ 0900.  Vanc trough = 13.0 AUC = 506.8  Weight: 62.1 kg (137 lb)  Temp (24hrs), Avg:98.2 F (36.8 C), Min:97.5 F (36.4 C), Max:98.8 F (37.1 C)  Recent Labs  Lab 12/31/22 0849 01/06/23 1934 01/06/23 2140  WBC 3.7* 0.3*  --   CREATININE 0.61 0.64  --   LATICACIDVEN  --  2.2* 2.1*    Estimated Creatinine Clearance: 80.9 mL/min (by C-G formula based on SCr of 0.64 mg/dL).    No Known Allergies  Antimicrobials this admission:   >>    >>   Dose adjustments this admission:   Microbiology results:  BCx:   UCx:    Sputum:    MRSA PCR:   Thank you for allowing pharmacy to be a part of this patient's care.  Kendrik Mcshan D 01/07/2023 1:35 AM

## 2023-01-07 NOTE — Consult Note (Signed)
Patient ID: Kyle Larson, male   DOB: 15-Feb-1956, 67 y.o.   MRN: [id number]  HPI Kyle Larson is a 67 y.o. male known to be seen in consultation for potential partial large bowel obstruction.  Does have a known history of unresectable rectal cancer and I did palliative diverting loop sigmoid colostomy last year.  He does have prolapse of the loop colostomy.  Unfortunately his cancer did not respond to neoadjuvant chemoradiation therapy and his disease has continued to progress.  He is now on palliative chemotherapy.  He now comes in with lethargy, chills abdominal discomfort.  He did have the last chemo last week. Does have significant history of coronary artery disease and prior stroke. The scan personally reviewed showing evidence of a very large rectal mass involving the sacrum.  Also evidence of type focal pneumonia and moderate emphysema. No evidence of free air or pneumatosis. Labs revealed hyponatremia and hypochloremia and hypokalemia albumin of 2.6 and total bili of 5.9 with total bili of 2.1. High sensitive troponin I was 11 and later 27. Lactic acid was 2.2 and later 2.1. CBC showed leukopenia of 0.3 and anemia slightly worse than previous levels with platelets of 61 show pancytopenia. INR is 1.3 and PT 16.2  HPI  Past Medical History:  Diagnosis Date   AC joint dislocation    CAD (coronary artery disease)    CVA (cerebral vascular accident) ([place])    Metatarsal bone fracture    3rd metatarsal , left foot; April 2021   Syncope    June '22   Tobacco abuse     Past Surgical History:  Procedure Laterality Date   ACROMIO-CLAVICULAR JOINT REPAIR Left 09/11/2016   Procedure: ACROMIO-CLAVICULAR RECONSTRUCTION;  Surgeon: Corky Mull, MD;  Location: [facility] ORS;  Service: Orthopedics;  Laterality: Left;  clavicle   FLEXIBLE SIGMOIDOSCOPY N/A 02/24/2022   Procedure: FLEXIBLE SIGMOIDOSCOPY;  Surgeon: Lesly Rubenstein, MD;  Location: [facility] ENDOS[facility];  Service: Endoscopy;  Laterality: N/A;    IR IMAGING GUIDED PORT INSERTION  03/10/2022   LEFT HEART CATH AND CORONARY ANGIOGRAPHY N/A 05/20/2021   Procedure: LEFT HEART CATH AND CORONARY ANGIOGRAPHY with coronary intervention;  Surgeon: [name], MD;  Location: [facility];  Service: Cardiovascular;  Laterality: N/A;   MANDIBLE RECONSTRUCTION     TONSILLECTOMY     AGE 55   XI ROBOTIC ASSISTED INGUINAL HERNIA REPAIR WITH MESH Right 03/26/2020   Procedure: XI ROBOTIC ASSISTED INGUINAL HERNIA REPAIR WITH MESH;  Surgeon: Herbert Pun, MD;  Location: [facility] ORS;  Service: General;  Laterality: Right;    Family History  Problem Relation Age of Onset   Diabetes Mother    Prostate cancer Father     Social History Social History   Tobacco Use   Smoking status: Some Days    Packs/day: 0.25    Years: 15.00    Total pack years: 3.75    Types: Cigarettes    Passive exposure: Current   Smokeless tobacco: Never  Vaping Use   Vaping Use: Never used  Substance Use Topics   Alcohol use: Yes    Alcohol/week: 42.0 standard drinks of alcohol    Types: 42 Cans of beer per week    Comment: 3 every other day   Drug use: No    No Known Allergies  Current Facility-Administered Medications  Medication Dose Route Frequency Provider Last Rate Last Admin   0.9 % NaCl with KCl 20 mEq/ L  infusion   Intravenous Continuous Mansy, Jan  A, MD 100 mL/hr at 01/07/23 0134 New Bag at 01/07/23 0134   acetaminophen (TYLENOL) tablet 650 mg  650 mg Oral Q6H PRN Mansy, Jan A, MD       Or   acetaminophen (TYLENOL) suppository 650 mg  650 mg Rectal Q6H PRN Mansy, Arvella Merles, MD       ALPRAZolam Duanne Moron) tablet 0.25 mg  0.25 mg Oral QHS PRN Mansy, Jan A, MD       atorvastatin (LIPITOR) tablet 40 mg  40 mg Oral Daily Mansy, Jan A, MD       azithromycin (ZITHROMAX) 500 mg in sodium chloride 0.9 % 250 mL IVPB  500 mg Intravenous Q24H Mansy, Jan A, MD   Stopped at 01/07/23 0235   cefTRIAXone (ROCEPHIN) 2 g in sodium chloride 0.9 % 100 mL IVPB  2  g Intravenous Q24H Mansy, Jan A, MD   Stopped at 01/07/23 [id number]   dexamethasone (DECADRON) tablet 2 mg  2 mg Oral BID PRN Mansy, Jan A, MD       enoxaparin (LOVENOX) injection 40 mg  40 mg Subcutaneous Q24H Mansy, Jan A, MD   40 mg at 01/07/23 0758   feeding supplement (ENSURE ENLIVE / ENSURE PLUS) liquid 237 mL  237 mL Oral TID BM Mansy, Jan A, MD       fentaNYL (DURAGESIC) 50 MCG/HR 1 patch  1 patch Transdermal Q72H Mansy, Jan A, MD   1 patch at 01/07/23 0127   gabapentin (NEURONTIN) capsule 100 mg  100 mg Oral TID Mansy, Jan A, MD   100 mg at 01/07/23 0125   magnesium hydroxide (MILK OF MAGNESIA) suspension 30 mL  30 mL Oral Daily PRN Mansy, Jan A, MD       morphine (PF) 2 MG/ML injection 2 mg  2 mg Intravenous Q2H PRN Mansy, Jan A, MD       multivitamin with minerals tablet 1 tablet  1 tablet Oral Daily Mansy, Jan A, MD       nicotine (NICODERM CQ - dosed in mg/24 hours) patch 21 mg  21 mg Transdermal Daily Mansy, Jan A, MD   21 mg at 01/07/23 0126   ondansetron (ZOFRAN) tablet 4 mg  4 mg Oral Q6H PRN Mansy, Jan A, MD       Or   ondansetron [facility]) injection 4 mg  4 mg Intravenous Q6H PRN Mansy, Jan A, MD       oxyCODONE (Oxy IR/ROXICODONE) immediate release tablet 10 mg  10 mg Oral Q6H PRN Mansy, Jan A, MD   10 mg at 01/07/23 0316   pantoprazole (PROTONIX) EC tablet 40 mg  40 mg Oral Daily Mansy, Jan A, MD       polyethylene glycol (MIRALAX / GLYCOLAX) packet 17 g  17 g Oral Daily Mansy, Jan A, MD       potassium chloride SA (KLOR-CON M) CR tablet 20 mEq  20 mEq Oral Daily Mansy, Jan A, MD       prochlorperazine (COMPAZINE) tablet 10 mg  10 mg Oral Q6H PRN Mansy, Jan A, MD       sotalol (BETAPACE) tablet 80 mg  80 mg Oral Daily Mansy, Jan A, MD       thiamine (VITAMIN B1) tablet 100 mg  100 mg Oral Daily Mansy, Jan A, MD       traZODone (DESYREL) tablet 25 mg  25 mg Oral QHS PRN Mansy, Arvella Merles, MD       vancomycin (VANCOCIN) IVPB 1000  mg/200 mL premix  1,000 mg Intravenous Q12H Mansy, Arvella Merles,  MD       Current Outpatient Medications  Medication Sig Dispense Refill   acetaminophen (TYLENOL) 325 MG tablet Take 2 tablets (650 mg total) by mouth every 6 (six) hours as needed for mild pain.     ALPRAZolam (XANAX) 0.25 MG tablet Take 1 tablet (0.25 mg total) by mouth at bedtime as needed for sleep. 30 tablet 0   atorvastatin (LIPITOR) 40 MG tablet Take 1 tablet (40 mg total) by mouth daily. 30 tablet 0   dexamethasone (DECADRON) 2 MG tablet Take 1 tablet (2 mg total) by mouth 2 (two) times daily as needed. for appetite and nausea. 60 tablet 2   fentaNYL (DURAGESIC) 50 MCG/HR Place 1 patch onto the skin every 3 (three) days. 10 patch 0   gabapentin (NEURONTIN) 100 MG capsule Take 1 capsule (100 mg total) by mouth 3 (three) times daily. 90 capsule 2   lidocaine-prilocaine (EMLA) cream Apply 1 application  topically as needed. Apply cream to port 1 hour prior to chemotherapy. Cover with plastic wrap. 30 g 3   loperamide (IMODIUM) 2 MG capsule Take 2 tabs by mouth with first loose stool, then 1 tab with each additional loose stool as needed. Do not exceed 8 tabs in a 24-hour period 60 capsule 2   Multiple Vitamin (MULTI-VITAMIN) tablet Take by mouth.     ondansetron (ZOFRAN) 8 MG tablet TAKE 1 TABLET 2 TIMES DAILY AS NEEDED FOR REFRACTORY NAUSEA / VOMITING. START DAY 3 AFTER CHEMO 60 tablet 2   Oxycodone HCl 10 MG TABS Take 1 tablet (10 mg total) by mouth every 6 (six) hours as needed. Okay to take 1/2 tablet as needed. 45 tablet 0   pantoprazole (PROTONIX) 40 MG tablet Take 1 tablet (40 mg total) by mouth daily. 30 tablet 2   polyethylene glycol (MIRALAX / GLYCOLAX) 17 g packet Take 17 g by mouth daily.     potassium chloride SA (KLOR-CON M) 20 MEQ tablet TAKE 1 TABLET(20 MEQ) BY MOUTH DAILY 90 tablet 0   prochlorperazine (COMPAZINE) 10 MG tablet Take 1 tablet (10 mg total) by mouth every 6 (six) hours as needed (Nausea or vomiting). 60 tablet 2   sotalol (BETAPACE) 80 MG tablet Take 80 mg by  mouth daily.     tamsulosin (FLOMAX) 0.4 MG CAPS capsule Take 2 capsules (0.8 mg total) by mouth daily. Take one in the morning and one at night. 60 capsule 11   thiamine 100 MG tablet Take 1 tablet (100 mg total) by mouth daily. 30 tablet 0   feeding supplement (ENSURE ENLIVE / ENSURE PLUS) LIQD Take 237 mLs by mouth 3 (three) times daily between meals. (Patient not taking: Reported on 12/05/2022) 21330 mL 0   lidocaine-prilocaine (EMLA) cream Apply to affected area once 30 g 3   loperamide (IMODIUM A-D) 2 MG tablet Take 2 mg by mouth 4 (four) times daily as needed for diarrhea or loose stools. (Patient not taking: Reported on 11/06/2022)     Multiple Vitamins-Minerals (MULTIVITAMIN WITH MINERALS) tablet Take 1 tablet by mouth daily.     nicotine (NICODERM CQ - DOSED IN MG/24 HOURS) 21 mg/24hr patch Place 21 mg onto the skin daily. (Patient not taking: Reported on 11/06/2022)     sildenafil (VIAGRA) 25 MG tablet Take 1 tablet (25 mg total) by mouth daily as needed for erectile dysfunction. 10 tablet 0     Review of Systems Full ROS  was asked and was negative except for the information on the HPI  Physical Exam Blood pressure 125/79, pulse 75, temperature 99 F (37.2 C), resp. rate 18, height 6' (1.829 m), weight 62.1 kg, SpO2 96 %. CONSTITUTIONAL: He is chronically ill, malnourished. Tachypneic EYES: Pupils are equal, round,  Sclera are non-icteric. EARS, NOSE, MOUTH AND THROAT: The oropharynx is clear. The oral mucosa is pink and moist. Hearing is intact to voice. LYMPH NODES:  Lymph nodes in the neck are normal. RESPIRATORY: Looks like a fish out of water He's Got horrible breath sounds with wheezes rhonchi and rales CARDIOVASCULAR: Heart is regular without murmurs, gallops, or rubs. GI: The abdomen is  soft, nontender, and nondistended.  There is evidence of a prolapsed loop colostomy on the left side without evidence of necrosis.  There is evidence of stool.   No peritonitis GU:  Rectal deferred.   MUSCULOSKELETAL: Normal muscle strength and tone. No cyanosis or edema.   SKIN: Turgor is good and there are no pathologic skin lesions or ulcers. NEUROLOGIC: Motor and sensation is grossly normal. Cranial nerves are grossly intact. PSYCH:  Oriented to person, place and time. Affect is normal.  Data Reviewed  I have personally reviewed the patient's imaging, laboratory findings and medical records.    Assessment/Plan 67 year old male with unresectable carcinoma of the rectum with progression of disease now with neutropenic fever and sepsis from multifocal pneumonia in addition some intermittent partial prolapse of the ostomy.  At this point the patient does not have an obstruction does not have an acute abdomen.  No need for any surgical intervention.  Unfortunately prognosis is poor given the significant deterioration over the last few months of the patient condition.  Patient wishes to continue chemotherapy and aggressive care. We will place him on a liquid diet and continue to reassess. Spent 75 minutes in this encounter including personally reviewing significant imaging studies, coordination of his care, placing orders and performing appropriate documentation  Caroleen Hamman, MD FACS General Surgeon 01/07/2023, 8:42 AM

## 2023-01-07 NOTE — Progress Notes (Signed)
Pharmacy Antibiotic Note  Kyle Larson is a 67 y.o. male admitted on 01/06/2023 with sepsis.  Pharmacy has been consulted for Vancomycin dosing.  Plan: Change to vancomycin 750 mg IV every 12 hours AUC goal 400-550 Estimated  AUC 469.1, Cmin 13.3 Scr 0.8, Vd coefficient 0.72, wt for CrCl & Ke TBW (62.1 kg)  Vancomycin levels at steady state or when clinically indicated Follow renal function for adjustments Azithromycin 500 mg IV every 24 hours and ceftriaxone 2 grams IV every 24 hours per provider   Height: 6' (182.9 cm) Weight: 62.1 kg (136 lb 14.5 oz) IBW/kg (Calculated) : 77.6  Temp (24hrs), Avg:98.7 F (37.1 C), Min:97.5 F (36.4 C), Max:100.3 F (37.9 C)  Recent Labs  Lab 01/06/23 1934 01/06/23 2140 01/07/23 0315  WBC 0.3*  --  0.2*  CREATININE 0.64  --  0.59*  LATICACIDVEN 2.2* 2.1*  --      Estimated Creatinine Clearance: 80.9 mL/min (A) (by C-G formula based on SCr of 0.59 mg/dL (L)).    No Known Allergies  Antimicrobials this admission:  Ceftriaxone/azithromycin 2/14 >>   Vancomycin 2/13  >>   Dose adjustments this admission: Vancomycin 1 gram q12h to 750 mg q12h  Microbiology results:  2/13 BCx: ngtd  2/13 UCx:  ordered  2/14 MRSA PCR: ordered  Thank you for allowing pharmacy to be a part of this patient's care.  Lorin Picket, PharmD 01/07/2023 12:12 PM

## 2023-01-07 NOTE — ED Notes (Signed)
Assumed care from [place]. Pt resting comfortably in bed at this time. Pt denies any current needs or questions. Call light with in reach.

## 2023-01-07 NOTE — Progress Notes (Signed)
PHARMACY - PHYSICIAN COMMUNICATION CRITICAL VALUE ALERT - BLOOD CULTURE IDENTIFICATION (BCID)  Kyle Larson is an 67 y.o. male who presented to [facility] on 01/06/2023 with a chief complaint of PNA  Assessment:  Blood cx: 1 of 4 bottles aerobic:  Gram negative [place]  Name of physician (or Provider) Contacted: Madaline Brilliant  Current antibiotics: Ceftriaxone/Azithromycin/Vancomcyin for PNA  Changes to prescribed antibiotics recommended:  Patient is on recommended antibiotics - No changes needed   ID RPh included on secure chat   Results for orders placed or performed during the hospital encounter of 01/06/23  Blood Culture ID Panel (Reflexed) (Collected: 01/06/2023  7:34 PM)  Result Value Ref Range   Enterococcus faecalis NOT DETECTED NOT DETECTED   Enterococcus Faecium NOT DETECTED NOT DETECTED   Listeria monocytogenes NOT DETECTED NOT DETECTED   Staphylococcus species NOT DETECTED NOT DETECTED   Staphylococcus aureus (BCID) NOT DETECTED NOT DETECTED   Staphylococcus epidermidis NOT DETECTED NOT DETECTED   Staphylococcus lugdunensis NOT DETECTED NOT DETECTED   Streptococcus species NOT DETECTED NOT DETECTED   Streptococcus agalactiae NOT DETECTED NOT DETECTED   Streptococcus pneumoniae NOT DETECTED NOT DETECTED   Streptococcus pyogenes NOT DETECTED NOT DETECTED   A.calcoaceticus-baumannii NOT DETECTED NOT DETECTED   Bacteroides fragilis NOT DETECTED NOT DETECTED   Enterobacterales NOT DETECTED NOT DETECTED   Enterobacter cloacae complex NOT DETECTED NOT DETECTED   Escherichia coli NOT DETECTED NOT DETECTED   Klebsiella aerogenes NOT DETECTED NOT DETECTED   Klebsiella oxytoca NOT DETECTED NOT DETECTED   Klebsiella pneumoniae NOT DETECTED NOT DETECTED   Proteus species NOT DETECTED NOT DETECTED   Salmonella species NOT DETECTED NOT DETECTED   Serratia marcescens NOT DETECTED NOT DETECTED   Haemophilus influenzae NOT DETECTED NOT DETECTED   Neisseria meningitidis NOT DETECTED NOT  DETECTED   Pseudomonas aeruginosa NOT DETECTED NOT DETECTED   Stenotrophomonas maltophilia NOT DETECTED NOT DETECTED   Candida albicans NOT DETECTED NOT DETECTED   Candida auris NOT DETECTED NOT DETECTED   Candida glabrata NOT DETECTED NOT DETECTED   Candida krusei NOT DETECTED NOT DETECTED   Candida parapsilosis NOT DETECTED NOT DETECTED   Candida tropicalis NOT DETECTED NOT DETECTED   Cryptococcus neoformans/gattii NOT DETECTED NOT DETECTED    Gabryelle Whitmoyer A 01/07/2023  6:58 PM

## 2023-01-07 NOTE — Assessment & Plan Note (Signed)
This is likely hypovolemic due to volume depletion and dehydration.

## 2023-01-07 NOTE — Assessment & Plan Note (Addendum)
-   This is due to multifocal pneumonia. - Sepsis manifested by leukopenia, tachycardia and tachypnea. - The patient will be admitted to a medical telemetry bed. - We will continue antibiotic therapy with IV Rocephin and Zithromax and add IV vancomycin being MRSA risk - Mucolytic therapy and bronchodilator therapy will be provided. - We will follow blood cultures. - We will continue hydration with IV normal saline with added potassium chloride. - O2 protocol for associated acute respiratory failure with hypoxia.

## 2023-01-07 NOTE — Plan of Care (Signed)
  Problem: Fluid Volume: Goal: Hemodynamic stability will improve Outcome: Progressing   Problem: Clinical Measurements: Goal: Diagnostic test results will improve Outcome: Progressing Goal: Signs and symptoms of infection will decrease Outcome: Progressing   Problem: Respiratory: Goal: Ability to maintain adequate ventilation will improve Outcome: Progressing   Problem: Education: Goal: Knowledge of General Education information will improve Description: Including pain rating scale, medication(s)/side effects and non-pharmacologic comfort measures Outcome: Progressing   Problem: Health Behavior/Discharge Planning: Goal: Ability to manage health-related needs will improve Outcome: Progressing   Problem: Clinical Measurements: Goal: Ability to maintain clinical measurements within normal limits will improve Outcome: Progressing Goal: Will remain free from infection Outcome: Progressing Goal: Diagnostic test results will improve Outcome: Progressing Goal: Respiratory complications will improve Outcome: Progressing Goal: Cardiovascular complication will be avoided Outcome: Progressing

## 2023-01-07 NOTE — ED Notes (Signed)
I called lab to add on Magnesium level

## 2023-01-07 NOTE — Assessment & Plan Note (Signed)
-   We will continue statin therapy. 

## 2023-01-07 NOTE — Progress Notes (Signed)
PROGRESS NOTE    Kyle Larson  [id number] DOB: 1956-10-21 DOA: 01/06/2023 PCP: Kathrine Haddock, NP   Brief Narrative:  This 67 yrs old male with medical history significant for rectal cancer on palliative chemo-radiotherapy, coronary artery disease, CVA, and tobacco abuse, who presented to the emergency room with acute onset of generalized weakness and generalized abdominal pain. Patient admitted to fever and chills.  He has been having cough with inability to expectorate as well as dyspnea and wheezing.  Pertinent labs in the ED includes hyponatremia, hypokalemia, total bilirubin 5.9 , lactic acid 2.2, CBC shows leukopenia WBC 0.3,  anemia and thrombocytopenia.  Influenza, RSV, COVID PCR negative.  CT abdomen and pelvis showed extensive bibasilar airway impaction and consolidation consistent with multifocal pneumonia, patient had for further evaluation and started on empiric antibiotics.  Assessment & Plan:   Principal Problem:   Sepsis due to pneumonia [facility]) Active Problems:   Large bowel obstruction (HCC)   Adenocarcinoma of rectum (HCC)   Hypokalemia   Hyponatremia   BPH (benign prostatic hyperplasia)   Dyslipidemia   Pancytopenia (HCC)  Sepsis secondary to Multifocal pneumonia: Acute hypoxic respiratory failure: Patient presented with leukopenia, tachycardia, tachypnea,  CT chest shows multifocal pneumonia Empirically started on IV Rocephin and Zithromax, IV vancomycin was added being MRSA risk. Continue mucolytic therapy, bronchodilator therapy Follow blood cultures.  Continue IV fluid resuscitation. He was hypoxic requiring supplemental oxygen 3 L for SpO2 98% He is successfully weaned down to room air.  Large bowel obstruction: This seems to be partial as he has transition point. General surgery is consulted. Patient was kept n.p.o., started on IV pain control and IV fluid resuscitation. Patient is started on clear liquid diet, no surgical intervention needed at this  point  Adenocarcinoma of rectum: Continue adequate pain control. Patient is getting palliative chemo and radiation therapy.  Pancytopenia: Continue to monitor CBC. Continue neutropenic precautions. Oncology is consulted.  Hyponatremia: Likely hypovolemic due to volume depletion and dehydration.  Hypokalemia Replaced.  Continue to monitor  BPH : Continue Flomax  Dyslipidemia: Continue Lipitor   DVT prophylaxis: Lovenox Code Status:Full  Family Communication: Wife at bed side. Disposition Plan:   Status is: Inpatient Remains inpatient appropriate because: Admitted for multifocal pneumonia requiring IV antibiotics.    Consultants:  General surgery  Procedures: None Antimicrobials:  Anti-infectives (From admission, onward)    Start     Dose/Rate Route Frequency Ordered Stop   01/07/23 2100  vancomycin (VANCOREADY) IVPB 750 mg/150 mL        750 mg 150 mL/hr over 60 Minutes Intravenous Every 12 hours 01/07/23 1220     01/07/23 0900  vancomycin (VANCOCIN) IVPB 1000 mg/200 mL premix  Status:  Discontinued        1,000 mg 200 mL/hr over 60 Minutes Intravenous Every 12 hours 01/07/23 0134 01/07/23 1220   01/07/23 0700  cefTRIAXone (ROCEPHIN) 2 g in sodium chloride 0.9 % 100 mL IVPB        2 g 200 mL/hr over 30 Minutes Intravenous Every 24 hours 01/06/23 2326 01/12/23 0659   01/07/23 0030  azithromycin (ZITHROMAX) 500 mg in sodium chloride 0.9 % 250 mL IVPB        500 mg 250 mL/hr over 60 Minutes Intravenous Every 24 hours 01/06/23 2326 01/12/23 0029   01/06/23 2000  vancomycin (VANCOREADY) IVPB 1500 mg/300 mL        1,500 mg 150 mL/hr over 120 Minutes Intravenous  Once 01/06/23 1958 01/06/23 2245   01/06/23  1945  ceFEPIme (MAXIPIME) 2 g in sodium chloride 0.9 % 100 mL IVPB        2 g 200 mL/hr over 30 Minutes Intravenous  Once 01/06/23 1936 01/06/23 2017   01/06/23 1945  metroNIDAZOLE (FLAGYL) IVPB 500 mg        500 mg 100 mL/hr over 60 Minutes Intravenous  Once  01/06/23 1936 01/06/23 2114      Subjective: Patient was seen and examined at bedside.  Overnight events noted.  Patient report doing much better, still reports feeling weak and tired.  Objective: Vitals:   01/07/23 1000 01/07/23 1130 01/07/23 1200 01/07/23 1300  BP: 138/83 123/84 (!) 134/94 126/83  Pulse: 76 68 79 68  Resp: 18 19 (!) 22 16  Temp:   (!) 97.4 F (36.3 C) 97.7 F (36.5 C)  TempSrc:   Oral   SpO2: 96%  97% 98%  Weight:      Height:        Intake/Output Summary (Last 24 hours) at 01/07/2023 1405 Last data filed at 01/07/2023 0235 Gross per 24 hour  Intake 250 ml  Output --  Net 250 ml   Filed Weights   01/06/23 1926 01/07/23 0757  Weight: 62.1 kg 62.1 kg    Examination:  General exam: Appears calm and comfortable, deconditioned. Respiratory system: Decreased breath sounds, respiratory effort normal, no accessory muscle use, RR 15 Cardiovascular system: S1 & S2 heard, regular rate and rhythm, no murmur. Gastrointestinal system: Abdomen is soft, non tender, non distended, BS+  Central nervous system: Alert and oriented. No focal neurological deficits. Extremities: No edema, no cyanosis, no clubbing Skin: No rashes, lesions or ulcers Psychiatry: Judgement and insight appear normal. Mood & affect appropriate.     Data Reviewed: I have personally reviewed following labs and imaging studies  CBC: Recent Labs  Lab 01/06/23 1934 01/07/23 0315  WBC 0.3* 0.2*  NEUTROABS 0.0*  --   HGB 12.8* 11.4*  HCT 35.6* 33.0*  MCV 90.6 93.8  PLT 61* 46*   Basic Metabolic Panel: Recent Labs  Lab 01/06/23 1934 01/07/23 0315  NA 129* 130*  K 3.2* 2.7*  CL 92* 96*  CO2 25 25  GLUCOSE 88 78  BUN 16 16  CREATININE 0.64 0.59*  CALCIUM 7.8* 7.6*  MG  --  1.8   GFR: Estimated Creatinine Clearance: 80.9 mL/min (A) (by C-G formula based on SCr of 0.59 mg/dL (L)). Liver Function Tests: Recent Labs  Lab 01/06/23 1934  AST 29  ALT 20  ALKPHOS 96  BILITOT  2.1*  PROT 5.9*  ALBUMIN 2.6*   No results for input(s): "LIPASE", "AMYLASE" in the last 168 hours. No results for input(s): "AMMONIA" in the last 168 hours. Coagulation Profile: Recent Labs  Lab 01/06/23 1934 01/07/23 0315  INR 1.3* 1.3*   Cardiac Enzymes: No results for input(s): "CKTOTAL", "CKMB", "CKMBINDEX", "TROPONINI" in the last 168 hours. BNP (last 3 results) No results for input(s): "PROBNP" in the last 8760 hours. HbA1C: No results for input(s): "HGBA1C" in the last 72 hours. CBG: No results for input(s): "GLUCAP" in the last 168 hours. Lipid Profile: No results for input(s): "CHOL", "HDL", "LDLCALC", "TRIG", "CHOLHDL", "LDLDIRECT" in the last 72 hours. Thyroid Function Tests: No results for input(s): "TSH", "T4TOTAL", "FREET4", "T3FREE", "THYROIDAB" in the last 72 hours. Anemia Panel: No results for input(s): "VITAMINB12", "FOLATE", "FERRITIN", "TIBC", "IRON", "RETICCTPCT" in the last 72 hours. Sepsis Labs: Recent Labs  Lab 01/06/23 1934 01/06/23 2140 01/07/23 0315  PROCALCITON  --   --  15.20  LATICACIDVEN 2.2* 2.1*  --     Recent Results (from the past 240 hour(s))  Blood Culture (routine x 2)     Status: None (Preliminary result)   Collection Time: 01/06/23  7:34 PM   Specimen: BLOOD  Result Value Ref Range Status   Specimen Description BLOOD BLOOD RIGHT ARM  Final   Special Requests   Final    BOTTLES DRAWN AEROBIC AND ANAEROBIC Blood Culture results may not be optimal due to an inadequate volume of blood received in culture bottles   Culture   Final    NO GROWTH < 12 HOURS Performed at [facility], [address], [place], [place] [address]    Report Status PENDING  Incomplete  Blood Culture (routine x 2)     Status: None (Preliminary result)   Collection Time: 01/06/23  7:34 PM   Specimen: BLOOD  Result Value Ref Range Status   Specimen Description BLOOD BLOOD LEFT ARM  Final   Special Requests   Final    BOTTLES DRAWN AEROBIC AND  ANAEROBIC Blood Culture adequate volume   Culture   Final    NO GROWTH < 12 HOURS Performed at [facility], [address], [place], [place] [address]    Report Status PENDING  Incomplete  Resp panel by RT-PCR (RSV, Flu A&B, Covid) Anterior Nasal Swab     Status: None   Collection Time: 01/06/23  7:34 PM   Specimen: Anterior Nasal Swab  Result Value Ref Range Status   SARS Coronavirus 2 by RT PCR NEGATIVE NEGATIVE Final    Comment: (NOTE) SARS-CoV-2 target nucleic acids are NOT DETECTED.  The SARS-CoV-2 RNA is generally detectable in upper respiratory specimens during the acute phase of infection. The lowest concentration of SARS-CoV-2 viral copies this assay can detect is 138 copies/mL. A negative result does not preclude SARS-Cov-2 infection and should not be used as the sole basis for treatment or other patient management decisions. A negative result may occur with  improper specimen collection/handling, submission of specimen other than nasopharyngeal swab, presence of viral mutation(s) within the areas targeted by this assay, and inadequate number of viral copies(<138 copies/mL). A negative result must be combined with clinical observations, patient history, and epidemiological information. The expected result is Negative.  Fact Sheet for Patients:  EntrepreneurPulse.com.au  Fact Sheet for Healthcare Providers:  IncredibleEmployment.be  This test is no t yet approved or cleared by the [place] FDA and  has been authorized for detection and/or diagnosis of SARS-CoV-2 by FDA under an Emergency Use Authorization (EUA). This EUA will remain  in effect (meaning this test can be used) for the duration of the COVID-19 declaration under Section 564(b)(1) of the Act, 21 U.S.C.section 360bbb-3(b)(1), unless the authorization is terminated  or revoked sooner.       Influenza A by PCR NEGATIVE NEGATIVE Final   Influenza B by PCR  NEGATIVE NEGATIVE Final    Comment: (NOTE) The Xpert Xpress SARS-CoV-2/FLU/RSV plus assay is intended as an aid in the diagnosis of influenza from Nasopharyngeal swab specimens and should not be used as a sole basis for treatment. Nasal washings and aspirates are unacceptable for Xpert Xpress SARS-CoV-2/FLU/RSV testing.  Fact Sheet for Patients: EntrepreneurPulse.com.au  Fact Sheet for Healthcare Providers: IncredibleEmployment.be  This test is not yet approved or cleared by the [place] FDA and has been authorized for detection and/or diagnosis of SARS-CoV-2 by FDA under an Emergency Use Authorization (EUA). This EUA will remain in  effect (meaning this test can be used) for the duration of the COVID-19 declaration under Section 564(b)(1) of the Act, 21 U.S.C. section 360bbb-3(b)(1), unless the authorization is terminated or revoked.     Resp Syncytial Virus by PCR NEGATIVE NEGATIVE Final    Comment: (NOTE) Fact Sheet for Patients: EntrepreneurPulse.com.au  Fact Sheet for Healthcare Providers: IncredibleEmployment.be  This test is not yet approved or cleared by the [place] FDA and has been authorized for detection and/or diagnosis of SARS-CoV-2 by FDA under an Emergency Use Authorization (EUA). This EUA will remain in effect (meaning this test can be used) for the duration of the COVID-19 declaration under Section 564(b)(1) of the Act, 21 U.S.C. section 360bbb-3(b)(1), unless the authorization is terminated or revoked.  Performed at [facility], [address] [place], [place] [address]     Radiology Studies: DG Abd 2 Views  Result Date: 01/07/2023 CLINICAL DATA:  Large bowel obstruction.  Abdominal pain. EXAM: ABDOMEN - 2 VIEW COMPARISON:  CT chest abdomen pelvis 01/06/2023 and chest radiograph 01/06/2023. FINDINGS: Right IJ power port tip is in the low SVC. Heart size stable.  Mild basilar hazy opacification. Small bilateral pleural effusions. Old left rib fractures. Minimal gaseous cecal distension. Bowel gas pattern is otherwise unremarkable. IMPRESSION: 1. Bibasilar aspiration or pneumonia. 2. Small bilateral pleural effusions. 3. Mild cecal distension, otherwise normal bowel gas pattern. Electronically Signed   By: Lorin Picket M.D.   On: 01/07/2023 10:20   CT CHEST ABDOMEN PELVIS W CONTRAST  Result Date: 01/06/2023 CLINICAL DATA:  Sepsis EXAM: CT CHEST, ABDOMEN, AND PELVIS WITH CONTRAST TECHNIQUE: Multidetector CT imaging of the chest, abdomen and pelvis was performed following the standard protocol during bolus administration of intravenous contrast. RADIATION DOSE REDUCTION: This exam was performed according to the departmental dose-optimization program which includes automated exposure control, adjustment of the mA and/or kV according to patient size and/or use of iterative reconstruction technique. CONTRAST:  15m OMNIPAQUE IOHEXOL 300 MG/ML  SOLN COMPARISON:  10/10/2022 FINDINGS: CT CHEST FINDINGS Cardiovascular: Moderate multi-vessel coronary artery calcification. Global cardiac size within normal limits. No pericardial effusion. Central pulmonary arteries are of normal caliber. No intraluminal filling defect through the segmental level to suggest acute pulmonary embolism. Mild atherosclerotic calcification within the thoracic aorta. No aortic aneurysm. Right internal jugular chest port tip noted within the superior vena cava. Mediastinum/Nodes: No pathologic thoracic adenopathy. Esophagus unremarkable. Visualized thyroid is unremarkable. Small hiatal hernia. Lungs/Pleura: Moderate emphysema. Extensive bibasilar airway impaction and consolidation within the posterior basal lower lobes bilaterally, left greater than right in keeping with changes of aspiration or multifocal infection. Superimposed bronchial wall thickening in keeping with airway inflammation. No  pneumothorax. No pleural effusion. Interval enlargement of a single pulmonary nodule along the left major fissure, axial image # 96/4, measuring 5 mm (previously measuring 3 mm), indeterminate. An isolated pulmonary metastasis is of low likelihood, but not completely excluded. Musculoskeletal: Multiple remote left rib fractures noted. No acute bone abnormality. No lytic or blastic bone lesion within the thorax. CT ABDOMEN PELVIS FINDINGS Hepatobiliary: Stable tiny hypodensity within the left hepatic lobe most in keeping with a tiny hepatic cyst. Liver otherwise unremarkable. Gallbladder unremarkable. No intra or extrahepatic biliary ductal dilation. Pancreas: Unremarkable. No pancreatic ductal dilatation or surrounding inflammatory changes. Spleen: Normal in size without focal abnormality. Adrenals/Urinary Tract: The adrenal glands are unremarkable. The kidneys are normal. The bladder is mildly distended, but is otherwise unremarkable. Stomach/Bowel: Centrally necrotic rectal mass is again identified now with interstitial gas  which may reflect changes of treatment effect or superimposed infection. The mass is again seen extending into the presacral space and abutting the left posterolateral pelvic sidewall in keeping with extensive local invasion. Lower quadrant diverting sigmoid loop colostomy is again identified with prominent stomal prolapse and partial large bowel obstruction with point of transition at the stomal hiatus at axial image # 106/2 with moderate retained stool proximally within the colon. The stomach, small bowel, and large bowel are otherwise unremarkable. No free intraperitoneal gas or fluid. Vascular/Lymphatic: Moderate aortoiliac atherosclerotic calcification. No aortic aneurysm. Previously noted mesorectal lymph node metastasis is again identified at axial image # 102/2 and is stable. No new pathologic adenopathy. Reproductive: Rectal mass abuts the left seminal vesicle, unchanged from prior  examination and the intervening flat pain appears obliterated, unchanged suggesting possible invasion of the structure. The prostate gland is unremarkable. Other: None Musculoskeletal: No acute bone abnormality. No lytic or blastic bone lesion. IMPRESSION: 1. Extensive bibasilar airway impaction and consolidation within the posterior basal lower lobes bilaterally, left greater than right, in keeping with changes of aspiration or multifocal infection. Superimposed bronchial wall thickening in keeping with airway inflammation. 2. Moderate emphysema. 3. Moderate multi-vessel coronary artery calcification. 4. Centrally necrotic rectal mass again identified with extensive local invasion of the presacral space and left posterolateral pelvic sidewall. Stable mesorectal lymph node metastasis. Developing interstitial gas within the primary mass may reflect changes of treatment effect and increasing necrosis and/or superimposed infection. 5. Lower quadrant diverting sigmoid loop colostomy with prominent stomal prolapse and partial large bowel obstruction with point of transition at the stomal hiatus. Moderate retained stool proximally within the colon. 6. Interval enlargement of a single pulmonary nodule along the left major fissure, indeterminate. An isolated pulmonary metastasis is of low likelihood, but not completely excluded. Close attention on a follow-up surveillance imaging is warranted. Aortic Atherosclerosis (ICD10-I70.0) and Emphysema (ICD10-J43.9). Electronically Signed   By: Fidela Salisbury M.D.   On: 01/06/2023 21:46   DG Chest Port 1 View  Result Date: 01/06/2023 CLINICAL DATA:  Possible sepsis. EXAM: PORTABLE CHEST 1 VIEW COMPARISON:  February 23, 2022 FINDINGS: Since the prior study there is been interval placement of a right-sided venous Port-A-Cath. Its distal tip is seen at the junction of the superior vena cava and right atrium. The heart size and mediastinal contours are within normal limits. Mild areas  of atelectasis and/or infiltrate are seen within the bilateral lung bases. There is no evidence of a pleural effusion or pneumothorax. Multiple chronic left-sided rib fractures are noted, with a radiopaque fixation plate and screws seen overlying the left acromion and distal left clavicle. IMPRESSION: Interval right-sided venous Port-A-Cath placement positioning, as described above, without evidence of pneumothorax. Electronically Signed   By: Virgina Norfolk M.D.   On: 01/06/2023 20:30    Scheduled Meds:  atorvastatin  40 mg Oral Daily   enoxaparin (LOVENOX) injection  40 mg Subcutaneous Q24H   feeding supplement  237 mL Oral TID BM   fentaNYL  1 patch Transdermal Q72H   gabapentin  100 mg Oral TID   multivitamin with minerals  1 tablet Oral Daily   nicotine  21 mg Transdermal Daily   pantoprazole  40 mg Oral Daily   polyethylene glycol  17 g Oral Daily   [START ON 01/08/2023] potassium chloride SA  20 mEq Oral Daily   sotalol  80 mg Oral Daily   thiamine  100 mg Oral Daily   Continuous Infusions:  0.9 % NaCl  with KCl 20 mEq / L 100 mL/hr at 01/07/23 1315   azithromycin Stopped (01/07/23 0235)   cefTRIAXone (ROCEPHIN)  IV Stopped (01/07/23 [id number])   vancomycin       LOS: 1 day    Time spent: 50 mins    Christie Viscomi, MD [facility]   If 7PM-7AM, please contact night-coverage

## 2023-01-07 NOTE — ED Notes (Signed)
Report given to Alexys, RN

## 2023-01-07 NOTE — Assessment & Plan Note (Signed)
-   Potassium will be replaced and magnesium will be checked.

## 2023-01-07 NOTE — Assessment & Plan Note (Addendum)
-   Pain management will be provided. - The patient is undergoing palliative chemo and radiotherapy with associated pancytopenia.

## 2023-01-07 NOTE — Consult Note (Signed)
[place]  [phone] [phone]  ID: [name] OB: [birth year]  MR#: [id number]  [id number]  Patient Care Team: Kathrine Haddock, NP as PCP - General (Nurse Practitioner) Jules Husbands, MD as Consulting Physician (General Surgery) Clent Jacks, RN as Oncology Nurse Navigator Grayland Ormond, Kathlene November, MD as Consulting Physician (Oncology) [name], MD as Consulting Physician (Cardiology) Michael Boston, MD as Consulting Physician (Colon and Rectal Surgery) Lesly Rubenstein, MD as Consulting Physician (Gastroenterology) Billey Co, MD as Consulting Physician (Urology)  CHIEF COMPLAINT: 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  INTERVAL HISTORY: Patient is a 67 year old male actively receiving chemotherapy for progressive rectal cancer who noted increasing weakness and fatigue as well as worsening shortness of breath.  Upon admission, he was found to be neutropenic.  He has no neurologic complaints.  He denies any chest pain, cough, or hemoptysis.  He has no nausea, vomiting, constipation, or diarrhea.  He has no urinary complaints.  Patient feels generally terrible, but offers no further specific complaints today.  REVIEW OF SYSTEMS:   Review of Systems  Constitutional:  Positive for fever and malaise/fatigue. Negative for weight loss.  Respiratory:  Positive for cough and shortness of breath. Negative for hemoptysis.   Cardiovascular: Negative.  Negative for leg swelling.  Gastrointestinal:  Positive for abdominal pain.  Genitourinary: Negative.  Negative for dysuria.  Musculoskeletal:  Positive for back pain.  Skin: Negative.  Negative for rash.  Neurological:  Positive for weakness. Negative for dizziness, focal weakness and headaches.  Psychiatric/Behavioral: Negative.  The patient is not nervous/anxious.     As per HPI. Otherwise, a complete review of systems is  negative.  PAST MEDICAL HISTORY: Past Medical History:  Diagnosis Date   AC joint dislocation    CAD (coronary artery disease)    CVA (cerebral vascular accident) ([place])    Metatarsal bone fracture    3rd metatarsal , left foot; April 2021   Syncope    June '22   Tobacco abuse     PAST SURGICAL HISTORY: Past Surgical History:  Procedure Laterality Date   ACROMIO-CLAVICULAR JOINT REPAIR Left 09/11/2016   Procedure: ACROMIO-CLAVICULAR RECONSTRUCTION;  Surgeon: Corky Mull, MD;  Location: [facility];  Service: Orthopedics;  Laterality: Left;  clavicle   FLEXIBLE SIGMOIDOSCOPY N/A 02/24/2022   Procedure: FLEXIBLE SIGMOIDOSCOPY;  Surgeon: Lesly Rubenstein, MD;  Location: [facility];  Service: Endoscopy;  Laterality: N/A;   IR IMAGING GUIDED PORT INSERTION  03/10/2022   LEFT HEART CATH AND CORONARY ANGIOGRAPHY N/A 05/20/2021   Procedure: LEFT HEART CATH AND CORONARY ANGIOGRAPHY with coronary intervention;  Surgeon: [name], MD;  Location: [facility];  Service: Cardiovascular;  Laterality: N/A;   MANDIBLE RECONSTRUCTION     TONSILLECTOMY     AGE 67   XI ROBOTIC ASSISTED INGUINAL HERNIA REPAIR WITH MESH Right 03/26/2020   Procedure: XI ROBOTIC ASSISTED INGUINAL HERNIA REPAIR WITH MESH;  Surgeon: Herbert Pun, MD;  Location: [facility];  Service: General;  Laterality: Right;    FAMILY HISTORY: Family History  Problem Relation Age of Onset   Diabetes Mother    Prostate cancer Father     ADVANCED DIRECTIVES (Y/N):  @ADVDIR$ @  HEALTH MAINTENANCE: Social History   Tobacco Use   Smoking status: Some Days    Packs/day: 0.25    Years: 15.00    Total pack years: 3.75    Types: Cigarettes    Passive exposure: Current  Smokeless tobacco: Never  Vaping Use   Vaping Use: Never used  Substance Use Topics   Alcohol use: Yes    Alcohol/week: 42.0 standard drinks of alcohol    Types: 42 Cans of beer per week    Comment: 3 every other day   Drug use: No      Colonoscopy:  PAP:  Bone density:  Lipid panel:  No Known Allergies  Current Facility-Administered Medications  Medication Dose Route Frequency Provider Last Rate Last Admin   0.9 % NaCl with KCl 20 mEq/ L  infusion   Intravenous Continuous Mansy, Jan A, MD 100 mL/hr at 01/07/23 2207 New Bag at 01/07/23 2207   acetaminophen (TYLENOL) tablet 650 mg  650 mg Oral Q6H PRN Mansy, Jan A, MD       Or   acetaminophen (TYLENOL) suppository 650 mg  650 mg Rectal Q6H PRN Mansy, Arvella Merles, MD       ALPRAZolam Duanne Moron) tablet 0.25 mg  0.25 mg Oral QHS PRN Mansy, Jan A, MD       atorvastatin (LIPITOR) tablet 40 mg  40 mg Oral Daily Mansy, Jan A, MD   40 mg at 01/07/23 0947   azithromycin (ZITHROMAX) 500 mg in sodium chloride 0.9 % 250 mL IVPB  500 mg Intravenous Q24H Mansy, Jan A, MD   Stopped at 01/07/23 0235   cefTRIAXone (ROCEPHIN) 2 g in sodium chloride 0.9 % 100 mL IVPB  2 g Intravenous Q24H Mansy, Jan A, MD   Stopped at 01/07/23 [id number]   dexamethasone (DECADRON) tablet 2 mg  2 mg Oral BID PRN Mansy, Jan A, MD       enoxaparin (LOVENOX) injection 40 mg  40 mg Subcutaneous Q24H Mansy, Jan A, MD   40 mg at 01/07/23 0758   feeding supplement (ENSURE ENLIVE / ENSURE PLUS) liquid 237 mL  237 mL Oral TID BM Mansy, Jan A, MD   237 mL at 01/07/23 1405   fentaNYL (DURAGESIC) 50 MCG/HR 1 patch  1 patch Transdermal Q72H Mansy, Jan A, MD   1 patch at 01/07/23 0127   gabapentin (NEURONTIN) capsule 100 mg  100 mg Oral TID Mansy, Jan A, MD   100 mg at 01/07/23 1622   magnesium hydroxide (MILK OF MAGNESIA) suspension 30 mL  30 mL Oral Daily PRN Mansy, Jan A, MD       morphine (PF) 2 MG/ML injection 2 mg  2 mg Intravenous Q2H PRN Mansy, Jan A, MD       multivitamin with minerals tablet 1 tablet  1 tablet Oral Daily Mansy, Jan A, MD   1 tablet at 01/07/23 0948   nicotine (NICODERM CQ - dosed in mg/24 hours) patch 21 mg  21 mg Transdermal Daily Mansy, Jan A, MD   21 mg at 01/07/23 0948   ondansetron (ZOFRAN) tablet 4  mg  4 mg Oral Q6H PRN Mansy, Jan A, MD       Or   ondansetron [facility]) injection 4 mg  4 mg Intravenous Q6H PRN Mansy, Jan A, MD       oxyCODONE (Oxy IR/ROXICODONE) immediate release tablet 10 mg  10 mg Oral Q6H PRN Mansy, Jan A, MD   10 mg at 01/07/23 0316   pantoprazole (PROTONIX) EC tablet 40 mg  40 mg Oral Daily Mansy, Jan A, MD   40 mg at 01/07/23 0948   polyethylene glycol (MIRALAX / GLYCOLAX) packet 17 g  17 g Oral Daily Mansy, Arvella Merles, MD       [  START ON 01/08/2023] potassium chloride SA (KLOR-CON M) CR tablet 20 mEq  20 mEq Oral Daily Shawna Clamp, MD       prochlorperazine (COMPAZINE) tablet 10 mg  10 mg Oral Q6H PRN Mansy, Jan A, MD       sotalol (BETAPACE) tablet 80 mg  80 mg Oral Daily Mansy, Jan A, MD   80 mg at 01/07/23 [id number]   thiamine (VITAMIN B1) tablet 100 mg  100 mg Oral Daily Mansy, Jan A, MD   100 mg at 01/07/23 0947   traZODone (DESYREL) tablet 25 mg  25 mg Oral QHS PRN Mansy, Jan A, MD       vancomycin (VANCOREADY) IVPB 750 mg/150 mL  750 mg Intravenous Q12H Lorin Picket, [place]        OBJECTIVE: Vitals:   01/07/23 1646 01/07/23 1949  BP: 132/84 (!) 151/108  Pulse: 76 91  Resp: 17 17  Temp: 97.9 F (36.6 C) 98.5 F (36.9 C)  SpO2: 98% 96%     Body mass index is 18.57 kg/m.    ECOG FS:4 - Bedbound  General: Ill-appearing, mild respiratory distress. Eyes: Pink conjunctiva, anicteric sclera. HEENT: Normocephalic, moist mucous membranes. Lungs: No audible wheezing or coughing. Heart: Regular rate and rhythm. Abdomen: Soft, nontender, no obvious distention. Musculoskeletal: No edema, cyanosis, or clubbing. Neuro: Alert, answering all questions appropriately. Cranial nerves grossly intact. Skin: No rashes or petechiae noted. Psych: Normal affect.  LAB RESULTS:  Lab Results  Component Value Date   NA 130 (L) 01/07/2023   K 2.7 (LL) 01/07/2023   CL 96 (L) 01/07/2023   CO2 25 01/07/2023   GLUCOSE 78 01/07/2023   BUN 16 01/07/2023   CREATININE 0.59 (L)  01/07/2023   CALCIUM 7.6 (L) 01/07/2023   PROT 5.9 (L) 01/06/2023   ALBUMIN 2.6 (L) 01/06/2023   AST 29 01/06/2023   ALT 20 01/06/2023   ALKPHOS 96 01/06/2023   BILITOT 2.1 (H) 01/06/2023   GFRNONAA >60 01/07/2023   GFRAA >60 10/03/2016    Lab Results  Component Value Date   WBC 0.2 (LL) 01/07/2023   NEUTROABS 0.0 (LL) 01/06/2023   HGB 11.4 (L) 01/07/2023   HCT 33.0 (L) 01/07/2023   MCV 93.8 01/07/2023   PLT 46 (L) 01/07/2023     STUDIES: DG Abd 2 Views  Result Date: 01/07/2023 CLINICAL DATA:  Large bowel obstruction.  Abdominal pain. EXAM: ABDOMEN - 2 VIEW COMPARISON:  CT chest abdomen pelvis 01/06/2023 and chest radiograph 01/06/2023. FINDINGS: Right IJ power port tip is in the low SVC. Heart size stable. Mild basilar hazy opacification. Small bilateral pleural effusions. Old left rib fractures. Minimal gaseous cecal distension. Bowel gas pattern is otherwise unremarkable. IMPRESSION: 1. Bibasilar aspiration or pneumonia. 2. Small bilateral pleural effusions. 3. Mild cecal distension, otherwise normal bowel gas pattern. Electronically Signed   By: Lorin Picket M.D.   On: 01/07/2023 10:20   CT CHEST ABDOMEN PELVIS W CONTRAST  Result Date: 01/06/2023 CLINICAL DATA:  Sepsis EXAM: CT CHEST, ABDOMEN, AND PELVIS WITH CONTRAST TECHNIQUE: Multidetector CT imaging of the chest, abdomen and pelvis was performed following the standard protocol during bolus administration of intravenous contrast. RADIATION DOSE REDUCTION: This exam was performed according to the departmental dose-optimization program which includes automated exposure control, adjustment of the mA and/or kV according to patient size and/or use of iterative reconstruction technique. CONTRAST:  174m OMNIPAQUE IOHEXOL 300 MG/ML  SOLN COMPARISON:  10/10/2022 FINDINGS: CT CHEST FINDINGS Cardiovascular: Moderate multi-vessel coronary artery calcification.  Global cardiac size within normal limits. No pericardial effusion. Central  pulmonary arteries are of normal caliber. No intraluminal filling defect through the segmental level to suggest acute pulmonary embolism. Mild atherosclerotic calcification within the thoracic aorta. No aortic aneurysm. Right internal jugular chest port tip noted within the superior vena cava. Mediastinum/Nodes: No pathologic thoracic adenopathy. Esophagus unremarkable. Visualized thyroid is unremarkable. Small hiatal hernia. Lungs/Pleura: Moderate emphysema. Extensive bibasilar airway impaction and consolidation within the posterior basal lower lobes bilaterally, left greater than right in keeping with changes of aspiration or multifocal infection. Superimposed bronchial wall thickening in keeping with airway inflammation. No pneumothorax. No pleural effusion. Interval enlargement of a single pulmonary nodule along the left major fissure, axial image # 96/4, measuring 5 mm (previously measuring 3 mm), indeterminate. An isolated pulmonary metastasis is of low likelihood, but not completely excluded. Musculoskeletal: Multiple remote left rib fractures noted. No acute bone abnormality. No lytic or blastic bone lesion within the thorax. CT ABDOMEN PELVIS FINDINGS Hepatobiliary: Stable tiny hypodensity within the left hepatic lobe most in keeping with a tiny hepatic cyst. Liver otherwise unremarkable. Gallbladder unremarkable. No intra or extrahepatic biliary ductal dilation. Pancreas: Unremarkable. No pancreatic ductal dilatation or surrounding inflammatory changes. Spleen: Normal in size without focal abnormality. Adrenals/Urinary Tract: The adrenal glands are unremarkable. The kidneys are normal. The bladder is mildly distended, but is otherwise unremarkable. Stomach/Bowel: Centrally necrotic rectal mass is again identified now with interstitial gas which may reflect changes of treatment effect or superimposed infection. The mass is again seen extending into the presacral space and abutting the left posterolateral  pelvic sidewall in keeping with extensive local invasion. Lower quadrant diverting sigmoid loop colostomy is again identified with prominent stomal prolapse and partial large bowel obstruction with point of transition at the stomal hiatus at axial image # 106/2 with moderate retained stool proximally within the colon. The stomach, small bowel, and large bowel are otherwise unremarkable. No free intraperitoneal gas or fluid. Vascular/Lymphatic: Moderate aortoiliac atherosclerotic calcification. No aortic aneurysm. Previously noted mesorectal lymph node metastasis is again identified at axial image # 102/2 and is stable. No new pathologic adenopathy. Reproductive: Rectal mass abuts the left seminal vesicle, unchanged from prior examination and the intervening flat pain appears obliterated, unchanged suggesting possible invasion of the structure. The prostate gland is unremarkable. Other: None Musculoskeletal: No acute bone abnormality. No lytic or blastic bone lesion. IMPRESSION: 1. Extensive bibasilar airway impaction and consolidation within the posterior basal lower lobes bilaterally, left greater than right, in keeping with changes of aspiration or multifocal infection. Superimposed bronchial wall thickening in keeping with airway inflammation. 2. Moderate emphysema. 3. Moderate multi-vessel coronary artery calcification. 4. Centrally necrotic rectal mass again identified with extensive local invasion of the presacral space and left posterolateral pelvic sidewall. Stable mesorectal lymph node metastasis. Developing interstitial gas within the primary mass may reflect changes of treatment effect and increasing necrosis and/or superimposed infection. 5. Lower quadrant diverting sigmoid loop colostomy with prominent stomal prolapse and partial large bowel obstruction with point of transition at the stomal hiatus. Moderate retained stool proximally within the colon. 6. Interval enlargement of a single pulmonary nodule  along the left major fissure, indeterminate. An isolated pulmonary metastasis is of low likelihood, but not completely excluded. Close attention on a follow-up surveillance imaging is warranted. Aortic Atherosclerosis (ICD10-I70.0) and Emphysema (ICD10-J43.9). Electronically Signed   By: Fidela Salisbury M.D.   On: 01/06/2023 21:46   DG Chest Port 1 View  Result Date: 01/06/2023 CLINICAL DATA:  Possible sepsis. EXAM: PORTABLE CHEST 1 VIEW COMPARISON:  February 23, 2022 FINDINGS: Since the prior study there is been interval placement of a right-sided venous Port-A-Cath. Its distal tip is seen at the junction of the superior vena cava and right atrium. The heart size and mediastinal contours are within normal limits. Mild areas of atelectasis and/or infiltrate are seen within the bilateral lung bases. There is no evidence of a pleural effusion or pneumothorax. Multiple chronic left-sided rib fractures are noted, with a radiopaque fixation plate and screws seen overlying the left acromion and distal left clavicle. IMPRESSION: Interval right-sided venous Port-A-Cath placement positioning, as described above, without evidence of pneumothorax. Electronically Signed   By: Virgina Norfolk M.D.   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  PLAN:    Sepsis secondary to pneumonia: CT scan results from January 06, 2023 reviewed independently suggesting bilateral multifocal pneumonia.  Respiratory panel is negative.  1 of 2 blood cultures growing gram-negative cocci.  Patient currently on broad-spectrum antibiotics. Progressive rectal cancer: Patient last received chemotherapy with FOLFIRI and Avastin approximately 1 week ago.  His next treatment is scheduled for 1 week, but this will likely need to be delayed until his acute symptoms resolve. Neutropenia: Secondary to FOLFIRI chemotherapy which patient received approximately 1 week ago.   Monitor.  Expect white blood cell count to start increasing over the next 3 to 4 days. Thrombocytopenia: Secondary to chemotherapy.  Monitor. Anemia: Mild, monitor. Hypokalemia: Replace electrolytes as needed. Hyponatremia: Chronic and unchanged.  Patient sodium is 130 today. Possible large bowel obstruction: Surgery has been consulted.  Patient is n.p.o. and being treated conservatively. Pain: Continue current narcotic regimen.  Appreciate consult, will follow.  Lloyd Huger, MD   01/07/2023 10:13 PM

## 2023-01-07 NOTE — Assessment & Plan Note (Addendum)
-   This seems to be partial and he has a transition point. - General surgery consult will be obtained. - The patient will be kept NPO. - We will continue hydration with IV normal saline with added potassium chloride. - General surgery consult will be obtained. - I notified Dr. Estanislado Emms about the patient

## 2023-01-07 NOTE — ED Notes (Signed)
Pt colostomy bag changed. Pt gown and male [place] changed.

## 2023-01-07 NOTE — ED Notes (Signed)
Pt colostomy bag coming undone and leaking around edges. RN attempting to get accurate bag replacement from supply. RN asked pt family if they can bring his supply so it is appropriate for his ostomy. Pt family states they can bring some from home but in the mean time we are trying to find it here.

## 2023-01-08 DIAGNOSIS — R7881 Bacteremia: Secondary | ICD-10-CM | POA: Diagnosis not present

## 2023-01-08 DIAGNOSIS — F17210 Nicotine dependence, cigarettes, uncomplicated: Secondary | ICD-10-CM

## 2023-01-08 DIAGNOSIS — R6520 Severe sepsis without septic shock: Secondary | ICD-10-CM | POA: Diagnosis not present

## 2023-01-08 DIAGNOSIS — J189 Pneumonia, unspecified organism: Secondary | ICD-10-CM | POA: Diagnosis not present

## 2023-01-08 DIAGNOSIS — J9601 Acute respiratory failure with hypoxia: Secondary | ICD-10-CM | POA: Diagnosis not present

## 2023-01-08 DIAGNOSIS — J1569 Pneumonia due to other gram-negative bacteria: Secondary | ICD-10-CM | POA: Diagnosis not present

## 2023-01-08 DIAGNOSIS — A419 Sepsis, unspecified organism: Secondary | ICD-10-CM | POA: Diagnosis not present

## 2023-01-08 DIAGNOSIS — C20 Malignant neoplasm of rectum: Secondary | ICD-10-CM | POA: Diagnosis not present

## 2023-01-08 LAB — MRSA NEXT GEN BY PCR, NASAL: MRSA by PCR Next Gen: NOT DETECTED

## 2023-01-08 LAB — CBC
HCT: 32 % — ABNORMAL LOW (ref 39.0–52.0)
Hemoglobin: 11.6 g/dL — ABNORMAL LOW (ref 13.0–17.0)
MCH: 32.4 pg (ref 26.0–34.0)
MCHC: 36.3 g/dL — ABNORMAL HIGH (ref 30.0–36.0)
MCV: 89.4 fL (ref 80.0–100.0)
Platelets: 64 10*3/uL — ABNORMAL LOW (ref 150–400)
RBC: 3.58 MIL/uL — ABNORMAL LOW (ref 4.22–5.81)
RDW: 17.8 % — ABNORMAL HIGH (ref 11.5–15.5)
WBC: 0.4 10*3/uL — CL (ref 4.0–10.5)
nRBC: 0 % (ref 0.0–0.2)

## 2023-01-08 LAB — MAGNESIUM: Magnesium: 2 mg/dL (ref 1.7–2.4)

## 2023-01-08 LAB — BASIC METABOLIC PANEL WITH GFR
Anion gap: 7 (ref 5–15)
BUN: 20 mg/dL (ref 8–23)
CO2: 21 mmol/L — ABNORMAL LOW (ref 22–32)
Calcium: 7.3 mg/dL — ABNORMAL LOW (ref 8.9–10.3)
Chloride: 104 mmol/L (ref 98–111)
Creatinine, Ser: 0.5 mg/dL — ABNORMAL LOW (ref 0.61–1.24)
GFR, Estimated: >60 mL/min
Glucose, Bld: 167 mg/dL — ABNORMAL HIGH (ref 70–99)
Potassium: 3.1 mmol/L — ABNORMAL LOW (ref 3.5–5.1)
Sodium: 132 mmol/L — ABNORMAL LOW (ref 135–145)

## 2023-01-08 LAB — RESPIRATORY PANEL BY PCR

## 2023-01-08 LAB — PHOSPHORUS: Phosphorus: <1 mg/dL — CL (ref 2.5–4.6)

## 2023-01-08 MED ORDER — POTASSIUM PHOSPHATES 15 MMOLE/5ML IV SOLN
30.0000 mmol | Freq: Once | INTRAVENOUS | Status: AC
  Administered 2023-01-08: 30 mmol via INTRAVENOUS
  Filled 2023-01-08: qty 10

## 2023-01-08 MED ORDER — VALACYCLOVIR HCL 500 MG PO TABS
1000.0000 mg | ORAL_TABLET | Freq: Two times a day (BID) | ORAL | Status: DC
  Administered 2023-01-08 – 2023-01-10 (×4): 1000 mg via ORAL
  Filled 2023-01-08 (×4): qty 2

## 2023-01-08 MED ORDER — POTASSIUM CHLORIDE CRYS ER 20 MEQ PO TBCR
40.0000 meq | EXTENDED_RELEASE_TABLET | Freq: Once | ORAL | Status: AC
  Administered 2023-01-08: 40 meq via ORAL
  Filled 2023-01-08: qty 2

## 2023-01-08 NOTE — Progress Notes (Signed)
       CROSS COVER NOTE  NAME: Kyle Larson MRN: [id number] DOB : Jul 19, 1956    HPI/Events of Note   Patient admitted for generalized weakness and abdominal pain. He was found to have multifocal pneumonia and large bowel obstruction as well as electrolyte derangements. Significant pertinent history for adenocarcinoma of the rectum with ongoing palliative chemo and radiation. Nurse reports critical phosphorous level of 1  Assessment and  Interventions   Assessment: Chem panel also reviewed. Mag 2.0 , k 3.1 Has daily 20 meq po potassium already ordered Plan: Potassium phosphate 30 mmol IV over 6 hours , repeat PO4 level in am Additional 40 meq of potassium po now      Kathlene Cote NP [facility]

## 2023-01-08 NOTE — Consult Note (Addendum)
NAME: [name]  DOB: 18-Jun-1956  MRN: [id number]  Date/Time: 01/08/2023 1:47 PM  REQUESTING PROVIDER: Dr.Kumar Subjective:  REASON FOR CONSULT: Bacteremia ?pt is too weak to give any history- chart reviewed Kyle Larson is a 67 y.o. 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, CAD, CVA, tobacco use presented to the ED on 2/13 with weakness, cough  , fever and chills In the ED vitals were 133/83, Pulse 108, Temp 98.8  Latest Reference Range & Units 01/06/23 19:34  WBC 4.0 - 10.5 K/uL 0.3 (LL) [1]  Hemoglobin 13.0 - 17.0 g/dL 12.8 (L)  HCT 39.0 - 52.0 % 35.6 (L)  Platelets 150 - 400 K/uL 61 (L) [2]  Creatinine 0.61 - 1.24 mg/dL 0.64  Blood culture sent, CXR b/l basilar infiltrate- CT chest b/l basilar infiltrate Started on broad spectrum antibiotic I am asked to see him for bacteremia Moraxella in blood culture Urine klebsiella   Past Medical History:  Diagnosis Date   AC joint dislocation    CAD (coronary artery disease)    CVA (cerebral vascular accident) ([place])    Metatarsal bone fracture    3rd metatarsal , left foot; April 2021   Syncope    June '22   Tobacco abuse     Past Surgical History:  Procedure Laterality Date   ACROMIO-CLAVICULAR JOINT REPAIR Left 09/11/2016   Procedure: ACROMIO-CLAVICULAR RECONSTRUCTION;  Surgeon: Corky Mull, MD;  Location: [facility];  Service: Orthopedics;  Laterality: Left;  clavicle   FLEXIBLE SIGMOIDOSCOPY N/A 02/24/2022   Procedure: FLEXIBLE SIGMOIDOSCOPY;  Surgeon: Lesly Rubenstein, MD;  Location: [facility];  Service: Endoscopy;  Laterality: N/A;   IR IMAGING GUIDED PORT INSERTION  03/10/2022   LEFT HEART CATH AND CORONARY ANGIOGRAPHY N/A 05/20/2021   Procedure: LEFT HEART CATH AND CORONARY ANGIOGRAPHY with coronary intervention;  Surgeon: [name], MD;  Location: [facility];  Service: Cardiovascular;  Laterality: N/A;   MANDIBLE RECONSTRUCTION     TONSILLECTOMY     AGE  70   XI ROBOTIC ASSISTED INGUINAL HERNIA REPAIR WITH MESH Right 03/26/2020   Procedure: XI ROBOTIC ASSISTED INGUINAL HERNIA REPAIR WITH MESH;  Surgeon: Herbert Pun, MD;  Location: [facility];  Service: General;  Laterality: Right;    Social History   Socioeconomic History   Marital status: Married    Spouse name: Not on file   Number of children: Not on file   Years of education: Not on file   Highest education level: Not on file  Occupational History   Not on file  Tobacco Use   Smoking status: Some Days    Packs/day: 0.25    Years: 15.00    Total pack years: 3.75    Types: Cigarettes    Passive exposure: Current   Smokeless tobacco: Never  Vaping Use   Vaping Use: Never used  Substance and Sexual Activity   Alcohol use: Yes    Alcohol/week: 42.0 standard drinks of alcohol    Types: 42 Cans of beer per week    Comment: 3 every other day   Drug use: No   Sexual activity: Yes  Other Topics Concern   Not on file  Social History Narrative   Not on file   Social Determinants of Health   Financial Resource Strain: Not on file  Food Insecurity: Unknown (01/07/2023)   Hunger Vital Sign    Worried About Running Out of Food in the Last Year: Patient refused    Ran Out  of Food in the Last Year: Patient refused  Transportation Needs: Unknown (01/07/2023)   PRAPARE - Hydrologist (Medical): Patient refused    Lack of Transportation (Non-Medical): Patient refused  Physical Activity: Not on file  Stress: Not on file  Social Connections: Not on file  Intimate Partner Violence: Unknown (01/07/2023)   Humiliation, Afraid, Rape, and Kick questionnaire    Fear of Current or Ex-Partner: Patient refused    Emotionally Abused: Patient refused    Physically Abused: Patient refused    Sexually Abused: Patient refused    Family History  Problem Relation Age of Onset   Diabetes Mother    Prostate cancer Father    No Known Allergies I? Current  Facility-Administered Medications  Medication Dose Route Frequency Provider Last Rate Last Admin   0.9 % NaCl with KCl 20 mEq/ L  infusion   Intravenous Continuous Mansy, Jan A, MD 100 mL/hr at 01/08/23 0919 New Bag at 01/08/23 0919   acetaminophen (TYLENOL) tablet 650 mg  650 mg Oral Q6H PRN Mansy, Jan A, MD       Or   acetaminophen (TYLENOL) suppository 650 mg  650 mg Rectal Q6H PRN Mansy, Arvella Merles, MD       ALPRAZolam Duanne Moron) tablet 0.25 mg  0.25 mg Oral QHS PRN Mansy, Jan A, MD       atorvastatin (LIPITOR) tablet 40 mg  40 mg Oral Daily Mansy, Jan A, MD   40 mg at 01/08/23 0930   azithromycin (ZITHROMAX) 500 mg in sodium chloride 0.9 % 250 mL IVPB  500 mg Intravenous Q24H Mansy, Jan A, MD 250 mL/hr at 01/08/23 0108 500 mg at 01/08/23 0108   cefTRIAXone (ROCEPHIN) 2 g in sodium chloride 0.9 % 100 mL IVPB  2 g Intravenous Q24H Mansy, Jan A, MD 200 mL/hr at 01/08/23 0744 2 g at 01/08/23 0744   dexamethasone (DECADRON) tablet 2 mg  2 mg Oral BID PRN Mansy, Jan A, MD       enoxaparin (LOVENOX) injection 40 mg  40 mg Subcutaneous Q24H Mansy, Jan A, MD   40 mg at 01/08/23 0931   feeding supplement (ENSURE ENLIVE / ENSURE PLUS) liquid 237 mL  237 mL Oral TID BM Mansy, Jan A, MD   237 mL at 01/08/23 1307   fentaNYL (DURAGESIC) 50 MCG/HR 1 patch  1 patch Transdermal Q72H Mansy, Jan A, MD   1 patch at 01/07/23 0127   gabapentin (NEURONTIN) capsule 100 mg  100 mg Oral TID Mansy, Jan A, MD   100 mg at 01/08/23 0929   magnesium hydroxide (MILK OF MAGNESIA) suspension 30 mL  30 mL Oral Daily PRN Mansy, Jan A, MD       morphine (PF) 2 MG/ML injection 2 mg  2 mg Intravenous Q2H PRN Mansy, Jan A, MD       multivitamin with minerals tablet 1 tablet  1 tablet Oral Daily Mansy, Jan A, MD   1 tablet at 01/08/23 0930   nicotine (NICODERM CQ - dosed in mg/24 hours) patch 21 mg  21 mg Transdermal Daily Mansy, Jan A, MD   21 mg at 01/08/23 0931   ondansetron (ZOFRAN) tablet 4 mg  4 mg Oral Q6H PRN Mansy, Jan A, MD        Or   ondansetron [facility]) injection 4 mg  4 mg Intravenous Q6H PRN Mansy, Jan A, MD       oxyCODONE (Oxy IR/ROXICODONE) immediate release tablet 10  mg  10 mg Oral Q6H PRN Mansy, Jan A, MD   10 mg at 01/08/23 0930   pantoprazole (PROTONIX) EC tablet 40 mg  40 mg Oral Daily Mansy, Jan A, MD   40 mg at 01/08/23 0929   polyethylene glycol (MIRALAX / GLYCOLAX) packet 17 g  17 g Oral Daily Mansy, Jan A, MD       potassium chloride SA (KLOR-CON M) CR tablet 20 mEq  20 mEq Oral Daily Shawna Clamp, MD   20 mEq at 01/08/23 0929   prochlorperazine (COMPAZINE) tablet 10 mg  10 mg Oral Q6H PRN Mansy, Jan A, MD       sotalol (BETAPACE) tablet 80 mg  80 mg Oral Daily Mansy, Jan A, MD   80 mg at 01/08/23 0930   thiamine (VITAMIN B1) tablet 100 mg  100 mg Oral Daily Mansy, Jan A, MD   100 mg at 01/08/23 0929   traZODone (DESYREL) tablet 25 mg  25 mg Oral QHS PRN Mansy, Jan A, MD       vancomycin (VANCOREADY) IVPB 750 mg/150 mL  750 mg Intravenous Q12H Lorin Picket, RPH 150 mL/hr at 01/08/23 0939 750 mg at 01/08/23 0939     Abtx:  Anti-infectives (From admission, onward)    Start     Dose/Rate Route Frequency Ordered Stop   01/07/23 2100  vancomycin (VANCOREADY) IVPB 750 mg/150 mL        750 mg 150 mL/hr over 60 Minutes Intravenous Every 12 hours 01/07/23 1220     01/07/23 0900  vancomycin (VANCOCIN) IVPB 1000 mg/200 mL premix  Status:  Discontinued        1,000 mg 200 mL/hr over 60 Minutes Intravenous Every 12 hours 01/07/23 0134 01/07/23 1220   01/07/23 0700  cefTRIAXone (ROCEPHIN) 2 g in sodium chloride 0.9 % 100 mL IVPB        2 g 200 mL/hr over 30 Minutes Intravenous Every 24 hours 01/06/23 2326 01/12/23 0659   01/07/23 0030  azithromycin (ZITHROMAX) 500 mg in sodium chloride 0.9 % 250 mL IVPB        500 mg 250 mL/hr over 60 Minutes Intravenous Every 24 hours 01/06/23 2326 01/12/23 0029   01/06/23 2000  vancomycin (VANCOREADY) IVPB 1500 mg/300 mL        1,500 mg 150 mL/hr over 120 Minutes  Intravenous  Once 01/06/23 1958 01/06/23 2245   01/06/23 1945  ceFEPIme (MAXIPIME) 2 g in sodium chloride 0.9 % 100 mL IVPB        2 g 200 mL/hr over 30 Minutes Intravenous  Once 01/06/23 1936 01/06/23 2017   01/06/23 1945  metroNIDAZOLE (FLAGYL) IVPB 500 mg        500 mg 100 mL/hr over 60 Minutes Intravenous  Once 01/06/23 1936 01/06/23 2114       REVIEW OF SYSTEMS:  NA Objective:  VITALS:  BP (!) 141/95 (BP Location: Right Arm)   Pulse 92   Temp 97.7 F (36.5 C) Comment: rechecked  Resp (!) 22   Ht 6' (1.829 m)   Wt 62.1 kg   SpO2 97%   BMI 18.57 kg/m   PHYSICAL EXAM:  General: very weak, lethargic, hoarse voice  Head: Normocephalic, without obvious abnormality, atraumatic. Eyes: Conjunctivae clear, anicteric sclerae. Pupils are equal ENT Nares normal. No drainage or sinus tenderness. Lips - excoriation, ulceration Neck:  symmetrical, no adenopathy, thyroid: non tender no carotid bruit and no JVD. Lungs: b/l air entry- crepts bases Heart: Tachycardia  Abdomen: soft- colostomy- Looks like prolapse of the colon in the bag Extremities: atraumatic, no cyanosis. No edema. No clubbing Skin: No rashes or lesions. Or bruising Lymph: Cervical, supraclavicular normal. Neurologic: Grossly non-focal Pertinent Labs Lab Results CBC    Component Value Date/Time   WBC 0.4 (LL) 01/08/2023 0339   RBC 3.58 (L) 01/08/2023 0339   HGB 11.6 (L) 01/08/2023 0339   HCT 32.0 (L) 01/08/2023 0339   PLT 64 (L) 01/08/2023 0339   MCV 89.4 01/08/2023 0339   MCH 32.4 01/08/2023 0339   MCHC 36.3 (H) 01/08/2023 0339   RDW 17.8 (H) 01/08/2023 0339   LYMPHSABS 0.2 (L) 01/06/2023 1934   MONOABS 0.0 (L) 01/06/2023 1934   EOSABS 0.0 01/06/2023 1934   BASOSABS 0.0 01/06/2023 1934       Latest Ref Rng & Units 01/08/2023    3:39 AM 01/07/2023    3:15 AM 01/06/2023    7:34 PM  CMP  Glucose 70 - 99 mg/dL 167  78  88   BUN 8 - 23 mg/dL 20  16  16   $ Creatinine 0.61 - 1.24 mg/dL 0.50  0.59  0.64    Sodium 135 - 145 mmol/L 132  130  129   Potassium 3.5 - 5.1 mmol/L 3.1  2.7  3.2   Chloride 98 - 111 mmol/L 104  96  92   CO2 22 - 32 mmol/L 21  25  25   $ Calcium 8.9 - 10.3 mg/dL 7.3  7.6  7.8   Total Protein 6.5 - 8.1 g/dL   5.9   Total Bilirubin 0.3 - 1.2 mg/dL   2.1   Alkaline Phos 38 - 126 U/L   96   AST 15 - 41 U/L   29   ALT 0 - 44 U/L   20       Microbiology: Recent Results (from the past 240 hour(s))  Blood Culture (routine x 2)     Status: Abnormal (Preliminary result)   Collection Time: 01/06/23  7:34 PM   Specimen: BLOOD  Result Value Ref Range Status   Specimen Description   Final    BLOOD BLOOD RIGHT ARM Performed at [facility], [address] [place], [place] [address]    Special Requests   Final    BOTTLES DRAWN AEROBIC AND ANAEROBIC Blood Culture results may not be optimal due to an inadequate volume of blood received in culture bottles Performed at [facility], [address] [place], [place] [address]    Culture  Setup Time   Final    GRAM NEGATIVE COCCI AEROBIC BOTTLE ONLY CRITICAL RESULT CALLED TO, READ BACK BY AND VERIFIED WITH: KRISTIN MERRILL 01/07/23 1836 MU    Culture (A)  Final    MORAXELLA CATARRHALIS(BRANHAMELLA) BETA LACTAMASE POSITIVE Performed at [facility], [place] [address], [place], [place] [address]    Report Status PENDING  Incomplete  Blood Culture (routine x 2)     Status: None (Preliminary result)   Collection Time: 01/06/23  7:34 PM   Specimen: BLOOD  Result Value Ref Range Status   Specimen Description BLOOD BLOOD LEFT ARM  Final   Special Requests   Final    BOTTLES DRAWN AEROBIC AND ANAEROBIC Blood Culture adequate volume   Culture   Final    NO GROWTH 2 DAYS Performed at [facility], [address], [place], [place] [address]    Report Status PENDING  Incomplete  Resp panel by RT-PCR (RSV,  Flu A&B, Covid) Anterior Nasal Swab     Status: None   Collection Time: 01/06/23  7:34 PM    Specimen: Anterior Nasal Swab  Result Value Ref Range Status   SARS Coronavirus 2 by RT PCR NEGATIVE NEGATIVE Final    Comment: (NOTE) SARS-CoV-2 target nucleic acids are NOT DETECTED.  The SARS-CoV-2 RNA is generally detectable in upper respiratory specimens during the acute phase of infection. The lowest concentration of SARS-CoV-2 viral copies this assay can detect is 138 copies/mL. A negative result does not preclude SARS-Cov-2 infection and should not be used as the sole basis for treatment or other patient management decisions. A negative result may occur with  improper specimen collection/handling, submission of specimen other than nasopharyngeal swab, presence of viral mutation(s) within the areas targeted by this assay, and inadequate number of viral copies(<138 copies/mL). A negative result must be combined with clinical observations, patient history, and epidemiological information. The expected result is Negative.  Fact Sheet for Patients:  EntrepreneurPulse.com.au  Fact Sheet for Healthcare Providers:  IncredibleEmployment.be  This test is no t yet approved or cleared by the [place] FDA and  has been authorized for detection and/or diagnosis of SARS-CoV-2 by FDA under an Emergency Use Authorization (EUA). This EUA will remain  in effect (meaning this test can be used) for the duration of the COVID-19 declaration under Section 564(b)(1) of the Act, 21 U.S.C.section 360bbb-3(b)(1), unless the authorization is terminated  or revoked sooner.       Influenza A by PCR NEGATIVE NEGATIVE Final   Influenza B by PCR NEGATIVE NEGATIVE Final    Comment: (NOTE) The Xpert Xpress SARS-CoV-2/FLU/RSV plus assay is intended as an aid in the diagnosis of influenza from Nasopharyngeal swab specimens and should not be used as a sole basis for treatment. Nasal washings and aspirates are unacceptable for Xpert Xpress  SARS-CoV-2/FLU/RSV testing.  Fact Sheet for Patients: EntrepreneurPulse.com.au  Fact Sheet for Healthcare Providers: IncredibleEmployment.be  This test is not yet approved or cleared by the [place] FDA and has been authorized for detection and/or diagnosis of SARS-CoV-2 by FDA under an Emergency Use Authorization (EUA). This EUA will remain in effect (meaning this test can be used) for the duration of the COVID-19 declaration under Section 564(b)(1) of the Act, 21 U.S.C. section 360bbb-3(b)(1), unless the authorization is terminated or revoked.     Resp Syncytial Virus by PCR NEGATIVE NEGATIVE Final    Comment: (NOTE) Fact Sheet for Patients: EntrepreneurPulse.com.au  Fact Sheet for Healthcare Providers: IncredibleEmployment.be  This test is not yet approved or cleared by the [place] FDA and has been authorized for detection and/or diagnosis of SARS-CoV-2 by FDA under an Emergency Use Authorization (EUA). This EUA will remain in effect (meaning this test can be used) for the duration of the COVID-19 declaration under Section 564(b)(1) of the Act, 21 U.S.C. section 360bbb-3(b)(1), unless the authorization is terminated or revoked.  Performed at [facility], [address] [place], [place] [address]   Blood Culture ID Panel (Reflexed)     Status: None   Collection Time: 01/06/23  7:34 PM  Result Value Ref Range Status   Enterococcus faecalis NOT DETECTED NOT DETECTED Final   Enterococcus Faecium NOT DETECTED NOT DETECTED Final   Listeria monocytogenes NOT DETECTED NOT DETECTED Final   Staphylococcus species NOT DETECTED NOT DETECTED Final   Staphylococcus aureus (BCID) NOT DETECTED NOT DETECTED Final   Staphylococcus epidermidis NOT DETECTED NOT DETECTED Final   Staphylococcus lugdunensis NOT  DETECTED NOT DETECTED Final   Streptococcus species NOT DETECTED NOT DETECTED Final    Streptococcus agalactiae NOT DETECTED NOT DETECTED Final   Streptococcus pneumoniae NOT DETECTED NOT DETECTED Final   Streptococcus pyogenes NOT DETECTED NOT DETECTED Final   A.calcoaceticus-baumannii NOT DETECTED NOT DETECTED Final   Bacteroides fragilis NOT DETECTED NOT DETECTED Final   Enterobacterales NOT DETECTED NOT DETECTED Final   Enterobacter cloacae complex NOT DETECTED NOT DETECTED Final   Escherichia coli NOT DETECTED NOT DETECTED Final   Klebsiella aerogenes NOT DETECTED NOT DETECTED Final   Klebsiella oxytoca NOT DETECTED NOT DETECTED Final   Klebsiella pneumoniae NOT DETECTED NOT DETECTED Final   Proteus species NOT DETECTED NOT DETECTED Final   Salmonella species NOT DETECTED NOT DETECTED Final   Serratia marcescens NOT DETECTED NOT DETECTED Final   Haemophilus influenzae NOT DETECTED NOT DETECTED Final   Neisseria meningitidis NOT DETECTED NOT DETECTED Final   Pseudomonas aeruginosa NOT DETECTED NOT DETECTED Final   Stenotrophomonas maltophilia NOT DETECTED NOT DETECTED Final   Candida albicans NOT DETECTED NOT DETECTED Final   Candida auris NOT DETECTED NOT DETECTED Final   Candida glabrata NOT DETECTED NOT DETECTED Final   Candida krusei NOT DETECTED NOT DETECTED Final   Candida parapsilosis NOT DETECTED NOT DETECTED Final   Candida tropicalis NOT DETECTED NOT DETECTED Final   Cryptococcus neoformans/gattii NOT DETECTED NOT DETECTED Final    Comment: Performed at [facility], [address], [place], [place] [address]  Urine Culture     Status: Abnormal (Preliminary result)   Collection Time: 01/06/23  9:40 PM   Specimen: Urine, Random  Result Value Ref Range Status   Specimen Description   Final    URINE, RANDOM Performed at [facility], [address], [place], [place] [address]    Special Requests   Final    NONE Performed at [facility], [address] [place], [place] [address]    Culture (A)  Final    >=100,000  COLONIES/mL KLEBSIELLA PNEUMONIAE SUSCEPTIBILITIES TO FOLLOW Performed at [facility], [address] [place], [place] [address]    Report Status PENDING  Incomplete  MRSA Next Gen by PCR, Nasal     Status: None   Collection Time: 01/08/23 11:07 AM   Specimen: Nasal Mucosa; Nasal Swab  Result Value Ref Range Status   MRSA by PCR Next Gen NOT DETECTED NOT DETECTED Final    Comment: (NOTE) The GeneXpert MRSA Assay (FDA approved for NASAL specimens only), is one component of a comprehensive MRSA colonization surveillance program. It is not intended to diagnose MRSA infection nor to guide or monitor treatment for MRSA infections. Test performance is not FDA approved in patients less than 30 years old. Performed at [facility], [address] [place],  [address]     IMAGING RESULTS:  I have personally reviewed the films ? Impression/Recommendation Moraxella bacteremia with bibasilar  pneumonia- borad spectrum antibiotics- deescalate to ceftriaxone  Neutropenia secondary to chemo Herpes simplex lips- for valtrex  ? Obstruction colon  Progressive rectal carcinoma on palliative FOLFORI  H/o BPH- r/o incomplete bladder emptying Klebsiella in urine culture- he is colonized with klebsiella  Thrombocytopenia  ?h/o CAD ? ___________________________________________________ Discussed with patient,and care team Note:  This document was prepared using Dragon voice recognition software and may include unintentional dictation errors.

## 2023-01-08 NOTE — Progress Notes (Signed)
[facility] SURGICAL PROGRESS NOTE (cpt [phone])  Hospital Day(s): 2.   Interval History: Patient seen and examined, no acute events or new complaints overnight. Patient resting comfortably; arouses to verbal stimuli but does not participate much. No abdominal pain, nausea, emesis. Continues to be neutropenic secondary to chemotherapy. Hypokalemia improving; up to 3.1 this morning. Renal function normal; sCr - 0.50; UO - 400 ccs + unmeasured. He is on CLD; tolerating. He is having ostomy function.    Review of Systems:  Constitutional: denies fever, chills; + fatigue HEENT: denies cough or congestion  Respiratory: denies any shortness of breath  Cardiovascular: denies chest pain or palpitations  Gastrointestinal: denies abdominal pain, N/V Genitourinary: denies burning with urination or urinary frequency Musculoskeletal: + generalized weakness  Vital signs in last 24 hours: [min-max] current  Temp:  [96 F (35.6 C)-98.5 F (36.9 C)] 96 F (35.6 C) (02/15 0800) Pulse Rate:  [68-92] 92 (02/15 0800) Resp:  [16-22] 17 (02/15 0534) BP: (123-151)/(83-108) 141/95 (02/15 0800) SpO2:  [96 %-100 %] 97 % (02/15 0800)     Height: 6' (182.9 cm) Weight: 62.1 kg BMI (Calculated): 18.56   Intake/Output last 2 shifts:  02/14 0701 - 02/15 0700 In: 480 [P.O.:480] Out: 400 [Urine:400]   Physical Exam:  Constitutional: resting comfortably; arouses; limited participation; NAD HENT: normocephalic without obvious abnormality  Eyes: PERRL, EOM's grossly intact and symmetric  Respiratory: breathing non-labored at rest  Cardiovascular: regular rate and sinus rhythm  Gastrointestinal: soft, non-tender, and non-distended; no rebound/guarding. Loop colostomy in left abdomen; mild prolapse, pink, patent, there is gas and stool in bag.    Labs:     Latest Ref Rng & Units 01/08/2023    3:39 AM 01/07/2023    3:15 AM 01/06/2023    7:34 PM  CBC  WBC 4.0 - 10.5 K/uL 0.4  0.2  0.3   Hemoglobin  13.0 - 17.0 g/dL 11.6  11.4  12.8   Hematocrit 39.0 - 52.0 % 32.0  33.0  35.6   Platelets 150 - 400 K/uL 64  46  61       Latest Ref Rng & Units 01/08/2023    3:39 AM 01/07/2023    3:15 AM 01/06/2023    7:34 PM  CMP  Glucose 70 - 99 mg/dL 167  78  88   BUN 8 - 23 mg/dL 20  16  16   $ Creatinine 0.61 - 1.24 mg/dL 0.50  0.59  0.64   Sodium 135 - 145 mmol/L 132  130  129   Potassium 3.5 - 5.1 mmol/L 3.1  2.7  3.2   Chloride 98 - 111 mmol/L 104  96  92   CO2 22 - 32 mmol/L 21  25  25   $ Calcium 8.9 - 10.3 mg/dL 7.3  7.6  7.8   Total Protein 6.5 - 8.1 g/dL   5.9   Total Bilirubin 0.3 - 1.2 mg/dL   2.1   Alkaline Phos 38 - 126 U/L   96   AST 15 - 41 U/L   29   ALT 0 - 44 U/L   20      Imaging studies: No new pertinent imaging studies   Assessment/Plan: (ICD-10's: K22.609) 67 y.o. male with unresectable carcinoma of the rectum with progression of disease now with neutropenic fever and sepsis from multifocal pneumonia in addition some intermittent partial prolapse of the ostomy.   - No evidence of large bowel obstruction; having adequate ostomy function. Okay to advance  diet as tolerated - Unfortunately very difficult situation, nothing to add surgically - Appreciate oncology input  - Monitor abdominal examination; on-going bowel function   - Pain control prn; antiemetics prn   - Monitor neutropenia; secondary to chemo - Okay to mobilize  - Further management per primary service; we will remain available. Please call with questions, concerns, or changes in clinical condition.    All of the above findings and recommendations were discussed with the patient, and the medical team, and all of patient's questions were answered to his expressed satisfaction.  -- Edison Simon, PA-C [facility] 01/08/2023, 8:36 AM M-F: 7am - 4pm

## 2023-01-08 NOTE — Progress Notes (Addendum)
PROGRESS NOTE    Kyle Larson  [id number] DOB: 12/05/1955 DOA: 01/06/2023 PCP: Kathrine Haddock, NP   Brief Narrative:  This 67 yrs old male with medical history significant for rectal cancer on palliative chemo-radiotherapy, coronary artery disease, CVA, and tobacco abuse, who presented to the emergency room with acute onset of generalized weakness and generalized abdominal pain. Patient admitted to have fever and chills.  He has been having cough with inability to expectorate as well as dyspnea and wheezing.  Pertinent labs in the ED includes hyponatremia, hypokalemia, total bilirubin 5.9 , lactic acid 2.2, CBC shows leukopenia WBC 0.3,  anemia and thrombocytopenia.  Influenza, RSV, COVID PCR negative.  CT abdomen and pelvis showed extensive bibasilar airway impaction and consolidation consistent with multifocal pneumonia, Patient admitted for further evaluation and started on empiric antibiotics.  Assessment & Plan:   Principal Problem:   Sepsis ([facility]) Active Problems:   Large bowel obstruction (HCC)   Adenocarcinoma of rectum (HCC)   Hypokalemia   Hyponatremia   BPH (benign prostatic hyperplasia)   Dyslipidemia   Pancytopenia (HCC)   Chemotherapy-induced neutropenia (HCC)  Sepsis secondary to Multifocal pneumonia: Acute hypoxic respiratory failure: Klebsiella Bacteremia: Neutropenic fever: Patient presented with leukopenia, tachycardia, tachypnea, hypoxia , lactic acid 2.2 CT chest showed multifocal pneumonia Empirically started on IV Rocephin and Zithromax, IV vancomycin was added being MRSA risk. Continue mucolytic therapy, bronchodilator therapy. Continue IV fluid resuscitation.  Urine culture and 1 of 4 blood cultures grew Klebsiella pneumonia. Continue neutropenic precautions. He was hypoxic requiring supplemental oxygen 3 L to improve SpO2 to 98% Continue supplemental oxygen and wean as tolerated. Infectious disease consulted, awaiting recommendation.  Large bowel  obstruction: This seems to be partial as he has transition point. General surgery is consulted.  Advised conservative management. Patient was kept n.p.o., started on IV pain control and IV fluid resuscitation. Patient is started on clear liquid diet, no surgical intervention needed at this point. LBO resolved.  Adenocarcinoma of rectum: Continue adequate pain control. Patient is getting palliative chemo and radiation therapy. Oncology states hold chemo and radiation until acute infection resolves.  Pancytopenia: Continue to monitor CBC. Continue neutropenic precautions. Oncology is consulted.  Hyponatremia: Likely hypovolemic due to volume depletion and dehydration. Continue to monitor serum electrolytes.  Hypokalemia /Hypophosphatemia: Replaced.  Continue to monitor  BPH : Continue Flomax.  Dyslipidemia: Continue Lipitor.   DVT prophylaxis: Lovenox Code Status:Full  Family Communication: Wife at bed side. Disposition Plan:   Status is: Inpatient Remains inpatient appropriate because: Admitted for multifocal pneumonia requiring IV antibiotics.  Infectious disease and general surgery is consulted.    Consultants:  General surgery Infectious diseases  Procedures: None Antimicrobials:  Anti-infectives (From admission, onward)    Start     Dose/Rate Route Frequency Ordered Stop   01/07/23 2100  vancomycin (VANCOREADY) IVPB 750 mg/150 mL        750 mg 150 mL/hr over 60 Minutes Intravenous Every 12 hours 01/07/23 1220     01/07/23 0900  vancomycin (VANCOCIN) IVPB 1000 mg/200 mL premix  Status:  Discontinued        1,000 mg 200 mL/hr over 60 Minutes Intravenous Every 12 hours 01/07/23 0134 01/07/23 1220   01/07/23 0700  cefTRIAXone (ROCEPHIN) 2 g in sodium chloride 0.9 % 100 mL IVPB        2 g 200 mL/hr over 30 Minutes Intravenous Every 24 hours 01/06/23 2326 01/12/23 0659   01/07/23 0030  azithromycin (ZITHROMAX) 500 mg in sodium chloride 0.9 %  250 mL IVPB         500 mg 250 mL/hr over 60 Minutes Intravenous Every 24 hours 01/06/23 2326 01/12/23 0029   01/06/23 2000  vancomycin (VANCOREADY) IVPB 1500 mg/300 mL        1,500 mg 150 mL/hr over 120 Minutes Intravenous  Once 01/06/23 1958 01/06/23 2245   01/06/23 1945  ceFEPIme (MAXIPIME) 2 g in sodium chloride 0.9 % 100 mL IVPB        2 g 200 mL/hr over 30 Minutes Intravenous  Once 01/06/23 1936 01/06/23 2017   01/06/23 1945  metroNIDAZOLE (FLAGYL) IVPB 500 mg        500 mg 100 mL/hr over 60 Minutes Intravenous  Once 01/06/23 1936 01/06/23 2114      Subjective: Patient was seen and examined at bedside.  Overnight events noted.   Patient reports feeling better, still reports feeling very weak and tired.  He still remains on 3 L of supplemental oxygen.  Objective: Vitals:   01/07/23 1949 01/08/23 0037 01/08/23 0534 01/08/23 0800  BP: (!) 151/108 (!) 147/96 (!) 143/101 (!) 141/95  Pulse: 91 84 90 92  Resp: 17 18 17 $ (!) 22  Temp: 98.5 F (36.9 C) 98.4 F (36.9 C) 98 F (36.7 C) 97.7 F (36.5 C)  TempSrc:      SpO2: 96% 97% 100% 97%  Weight:      Height:        Intake/Output Summary (Last 24 hours) at 01/08/2023 1054 Last data filed at 01/08/2023 0615 Gross per 24 hour  Intake 480 ml  Output 400 ml  Net 80 ml   Filed Weights   01/06/23 1926 01/07/23 0757  Weight: 62.1 kg 62.1 kg    Examination:  General exam: Appears comfortable, not in any acute distress, deconditioned. Respiratory system: Decreased breath sounds, respiratory effort normal, no accessory muscle use. Cardiovascular system: S1 & S2 heard, regular rate and rhythm, no murmur. Gastrointestinal system: Abdomen is soft, non tender, non distended, BS+  Central nervous system: Alert and oriented. No focal neurological deficits. Extremities: No edema, no cyanosis, no clubbing Skin: No rashes, lesions or ulcers Psychiatry: Judgement and insight appear normal. Mood & affect appropriate.     Data Reviewed: I have  personally reviewed following labs and imaging studies  CBC: Recent Labs  Lab 01/06/23 1934 01/07/23 0315 01/08/23 0339  WBC 0.3* 0.2* 0.4*  NEUTROABS 0.0*  --   --   HGB 12.8* 11.4* 11.6*  HCT 35.6* 33.0* 32.0*  MCV 90.6 93.8 89.4  PLT 61* 46* 64*   Basic Metabolic Panel: Recent Labs  Lab 01/06/23 1934 01/07/23 0315 01/08/23 0339  NA 129* 130* 132*  K 3.2* 2.7* 3.1*  CL 92* 96* 104  CO2 25 25 21*  GLUCOSE 88 78 167*  BUN 16 16 20  $ CREATININE 0.64 0.59* 0.50*  CALCIUM 7.8* 7.6* 7.3*  MG  --  1.8 2.0  PHOS  --   --  <1.0*   GFR: Estimated Creatinine Clearance: 80.9 mL/min (A) (by C-G formula based on SCr of 0.5 mg/dL (L)). Liver Function Tests: Recent Labs  Lab 01/06/23 1934  AST 29  ALT 20  ALKPHOS 96  BILITOT 2.1*  PROT 5.9*  ALBUMIN 2.6*   No results for input(s): "LIPASE", "AMYLASE" in the last 168 hours. No results for input(s): "AMMONIA" in the last 168 hours. Coagulation Profile: Recent Labs  Lab 01/06/23 1934 01/07/23 0315  INR 1.3* 1.3*   Cardiac Enzymes: No results for  input(s): "CKTOTAL", "CKMB", "CKMBINDEX", "TROPONINI" in the last 168 hours. BNP (last 3 results) No results for input(s): "PROBNP" in the last 8760 hours. HbA1C: No results for input(s): "HGBA1C" in the last 72 hours. CBG: No results for input(s): "GLUCAP" in the last 168 hours. Lipid Profile: No results for input(s): "CHOL", "HDL", "LDLCALC", "TRIG", "CHOLHDL", "LDLDIRECT" in the last 72 hours. Thyroid Function Tests: No results for input(s): "TSH", "T4TOTAL", "FREET4", "T3FREE", "THYROIDAB" in the last 72 hours. Anemia Panel: No results for input(s): "VITAMINB12", "FOLATE", "FERRITIN", "TIBC", "IRON", "RETICCTPCT" in the last 72 hours. Sepsis Labs: Recent Labs  Lab 01/06/23 1934 01/06/23 2140 01/07/23 0315  PROCALCITON  --   --  15.20  LATICACIDVEN 2.2* 2.1*  --     Recent Results (from the past 240 hour(s))  Blood Culture (routine x 2)     Status: None  (Preliminary result)   Collection Time: 01/06/23  7:34 PM   Specimen: BLOOD  Result Value Ref Range Status   Specimen Description   Final    BLOOD BLOOD RIGHT ARM Performed at [name], [address] [place], [place] [address]    Special Requests   Final    BOTTLES DRAWN AEROBIC AND ANAEROBIC Blood Culture results may not be optimal due to an inadequate volume of blood received in culture bottles Performed at [facility], [address] [place], [place] [address]    Culture  Setup Time   Final    GRAM NEGATIVE COCCI AEROBIC BOTTLE ONLY CRITICAL RESULT CALLED TO, READ BACK BY AND VERIFIED WITH: Chinita Greenland 01/07/23 1836 MU Performed at [facility], [address], [place], [place] [address]    Culture GRAM NEGATIVE COCCI  Final   Report Status PENDING  Incomplete  Blood Culture (routine x 2)     Status: None (Preliminary result)   Collection Time: 01/06/23  7:34 PM   Specimen: BLOOD  Result Value Ref Range Status   Specimen Description BLOOD BLOOD LEFT ARM  Final   Special Requests   Final    BOTTLES DRAWN AEROBIC AND ANAEROBIC Blood Culture adequate volume   Culture   Final    NO GROWTH 2 DAYS Performed at [facility], [address], [place], [place] [address]    Report Status PENDING  Incomplete  Resp panel by RT-PCR (RSV, Flu A&B, Covid) Anterior Nasal Swab     Status: None   Collection Time: 01/06/23  7:34 PM   Specimen: Anterior Nasal Swab  Result Value Ref Range Status   SARS Coronavirus 2 by RT PCR NEGATIVE NEGATIVE Final    Comment: (NOTE) SARS-CoV-2 target nucleic acids are NOT DETECTED.  The SARS-CoV-2 RNA is generally detectable in upper respiratory specimens during the acute phase of infection. The lowest concentration of SARS-CoV-2 viral copies this assay can detect is 138 copies/mL. A negative result does not preclude SARS-Cov-2 infection and should not be used as the sole basis for treatment or other patient  management decisions. A negative result may occur with  improper specimen collection/handling, submission of specimen other than nasopharyngeal swab, presence of viral mutation(s) within the areas targeted by this assay, and inadequate number of viral copies(<138 copies/mL). A negative result must be combined with clinical observations, patient history, and epidemiological information. The expected result is Negative.  Fact Sheet for Patients:  EntrepreneurPulse.com.au  Fact Sheet for Healthcare Providers:  IncredibleEmployment.be  This test is no t yet approved or cleared by the [place] and  has been authorized for detection and/or diagnosis of SARS-CoV-2 by FDA under an Emergency Use Authorization (EUA). This EUA will remain  in effect (meaning this test can be used) for the duration of the COVID-19 declaration under Section 564(b)(1) of the Act, 21 U.S.C.section 360bbb-3(b)(1), unless the authorization is terminated  or revoked sooner.       Influenza A by PCR NEGATIVE NEGATIVE Final   Influenza B by PCR NEGATIVE NEGATIVE Final    Comment: (NOTE) The Xpert Xpress SARS-CoV-2/FLU/RSV plus assay is intended as an aid in the diagnosis of influenza from Nasopharyngeal swab specimens and should not be used as a sole basis for treatment. Nasal washings and aspirates are unacceptable for Xpert Xpress SARS-CoV-2/FLU/RSV testing.  Fact Sheet for Patients: EntrepreneurPulse.com.au  Fact Sheet for Healthcare Providers: IncredibleEmployment.be  This test is not yet approved or cleared by the [place] FDA and has been authorized for detection and/or diagnosis of SARS-CoV-2 by FDA under an Emergency Use Authorization (EUA). This EUA will remain in effect (meaning this test can be used) for the duration of the COVID-19 declaration under Section 564(b)(1) of the Act, 21 U.S.C. section 360bbb-3(b)(1),  unless the authorization is terminated or revoked.     Resp Syncytial Virus by PCR NEGATIVE NEGATIVE Final    Comment: (NOTE) Fact Sheet for Patients: EntrepreneurPulse.com.au  Fact Sheet for Healthcare Providers: IncredibleEmployment.be  This test is not yet approved or cleared by the [place] FDA and has been authorized for detection and/or diagnosis of SARS-CoV-2 by FDA under an Emergency Use Authorization (EUA). This EUA will remain in effect (meaning this test can be used) for the duration of the COVID-19 declaration under Section 564(b)(1) of the Act, 21 U.S.C. section 360bbb-3(b)(1), unless the authorization is terminated or revoked.  Performed at [facility], [address] [place], [place] [address]   Blood Culture ID Panel (Reflexed)     Status: None   Collection Time: 01/06/23  7:34 PM  Result Value Ref Range Status   Enterococcus faecalis NOT DETECTED NOT DETECTED Final   Enterococcus Faecium NOT DETECTED NOT DETECTED Final   Listeria monocytogenes NOT DETECTED NOT DETECTED Final   Staphylococcus species NOT DETECTED NOT DETECTED Final   Staphylococcus aureus (BCID) NOT DETECTED NOT DETECTED Final   Staphylococcus epidermidis NOT DETECTED NOT DETECTED Final   Staphylococcus lugdunensis NOT DETECTED NOT DETECTED Final   Streptococcus species NOT DETECTED NOT DETECTED Final   Streptococcus agalactiae NOT DETECTED NOT DETECTED Final   Streptococcus pneumoniae NOT DETECTED NOT DETECTED Final   Streptococcus pyogenes NOT DETECTED NOT DETECTED Final   A.calcoaceticus-baumannii NOT DETECTED NOT DETECTED Final   Bacteroides fragilis NOT DETECTED NOT DETECTED Final   Enterobacterales NOT DETECTED NOT DETECTED Final   Enterobacter cloacae complex NOT DETECTED NOT DETECTED Final   Escherichia coli NOT DETECTED NOT DETECTED Final   Klebsiella aerogenes NOT DETECTED NOT DETECTED Final   Klebsiella oxytoca NOT DETECTED NOT  DETECTED Final   Klebsiella pneumoniae NOT DETECTED NOT DETECTED Final   Proteus species NOT DETECTED NOT DETECTED Final   Salmonella species NOT DETECTED NOT DETECTED Final   Serratia marcescens NOT DETECTED NOT DETECTED Final   Haemophilus influenzae NOT DETECTED NOT DETECTED Final   Neisseria meningitidis NOT DETECTED NOT DETECTED Final   Pseudomonas aeruginosa NOT DETECTED NOT DETECTED Final   Stenotrophomonas maltophilia NOT DETECTED NOT DETECTED Final   Candida albicans NOT DETECTED NOT DETECTED Final   Candida auris NOT DETECTED NOT DETECTED Final   Candida glabrata  NOT DETECTED NOT DETECTED Final   Candida krusei NOT DETECTED NOT DETECTED Final   Candida parapsilosis NOT DETECTED NOT DETECTED Final   Candida tropicalis NOT DETECTED NOT DETECTED Final   Cryptococcus neoformans/gattii NOT DETECTED NOT DETECTED Final    Comment: Performed at [facility], [address], [place], [place] [address]  Urine Culture     Status: Abnormal (Preliminary result)   Collection Time: 01/06/23  9:40 PM   Specimen: Urine, Random  Result Value Ref Range Status   Specimen Description   Final    URINE, RANDOM Performed at [facility], [address] [place], [place] [address]    Special Requests   Final    NONE Performed at [facility], [address], [place], [place] [address]    Culture (A)  Final    >=100,000 COLONIES/mL KLEBSIELLA PNEUMONIAE SUSCEPTIBILITIES TO FOLLOW Performed at [facility], [place] [address], [place], [place] [address]    Report Status PENDING  Incomplete    Radiology Studies: DG Abd 2 Views  Result Date: 01/07/2023 CLINICAL DATA:  Large bowel obstruction.  Abdominal pain. EXAM: ABDOMEN - 2 VIEW COMPARISON:  CT chest abdomen pelvis 01/06/2023 and chest radiograph 01/06/2023. FINDINGS: Right IJ power port tip is in the low SVC. Heart size stable. Mild basilar hazy opacification. Small bilateral pleural effusions. Old left rib  fractures. Minimal gaseous cecal distension. Bowel gas pattern is otherwise unremarkable. IMPRESSION: 1. Bibasilar aspiration or pneumonia. 2. Small bilateral pleural effusions. 3. Mild cecal distension, otherwise normal bowel gas pattern. Electronically Signed   By: Lorin Picket M.D.   On: 01/07/2023 10:20   CT CHEST ABDOMEN PELVIS W CONTRAST  Result Date: 01/06/2023 CLINICAL DATA:  Sepsis EXAM: CT CHEST, ABDOMEN, AND PELVIS WITH CONTRAST TECHNIQUE: Multidetector CT imaging of the chest, abdomen and pelvis was performed following the standard protocol during bolus administration of intravenous contrast. RADIATION DOSE REDUCTION: This exam was performed according to the departmental dose-optimization program which includes automated exposure control, adjustment of the mA and/or kV according to patient size and/or use of iterative reconstruction technique. CONTRAST:  16m OMNIPAQUE IOHEXOL 300 MG/ML  SOLN COMPARISON:  10/10/2022 FINDINGS: CT CHEST FINDINGS Cardiovascular: Moderate multi-vessel coronary artery calcification. Global cardiac size within normal limits. No pericardial effusion. Central pulmonary arteries are of normal caliber. No intraluminal filling defect through the segmental level to suggest acute pulmonary embolism. Mild atherosclerotic calcification within the thoracic aorta. No aortic aneurysm. Right internal jugular chest port tip noted within the superior vena cava. Mediastinum/Nodes: No pathologic thoracic adenopathy. Esophagus unremarkable. Visualized thyroid is unremarkable. Small hiatal hernia. Lungs/Pleura: Moderate emphysema. Extensive bibasilar airway impaction and consolidation within the posterior basal lower lobes bilaterally, left greater than right in keeping with changes of aspiration or multifocal infection. Superimposed bronchial wall thickening in keeping with airway inflammation. No pneumothorax. No pleural effusion. Interval enlargement of a single pulmonary nodule along  the left major fissure, axial image # 96/4, measuring 5 mm (previously measuring 3 mm), indeterminate. An isolated pulmonary metastasis is of low likelihood, but not completely excluded. Musculoskeletal: Multiple remote left rib fractures noted. No acute bone abnormality. No lytic or blastic bone lesion within the thorax. CT ABDOMEN PELVIS FINDINGS Hepatobiliary: Stable tiny hypodensity within the left hepatic lobe most in keeping with a tiny hepatic cyst. Liver otherwise unremarkable. Gallbladder unremarkable. No intra or extrahepatic biliary ductal dilation. Pancreas: Unremarkable. No pancreatic ductal dilatation or surrounding inflammatory changes. Spleen: Normal in size without focal abnormality. Adrenals/Urinary  Tract: The adrenal glands are unremarkable. The kidneys are normal. The bladder is mildly distended, but is otherwise unremarkable. Stomach/Bowel: Centrally necrotic rectal mass is again identified now with interstitial gas which may reflect changes of treatment effect or superimposed infection. The mass is again seen extending into the presacral space and abutting the left posterolateral pelvic sidewall in keeping with extensive local invasion. Lower quadrant diverting sigmoid loop colostomy is again identified with prominent stomal prolapse and partial large bowel obstruction with point of transition at the stomal hiatus at axial image # 106/2 with moderate retained stool proximally within the colon. The stomach, small bowel, and large bowel are otherwise unremarkable. No free intraperitoneal gas or fluid. Vascular/Lymphatic: Moderate aortoiliac atherosclerotic calcification. No aortic aneurysm. Previously noted mesorectal lymph node metastasis is again identified at axial image # 102/2 and is stable. No new pathologic adenopathy. Reproductive: Rectal mass abuts the left seminal vesicle, unchanged from prior examination and the intervening flat pain appears obliterated, unchanged suggesting possible  invasion of the structure. The prostate gland is unremarkable. Other: None Musculoskeletal: No acute bone abnormality. No lytic or blastic bone lesion. IMPRESSION: 1. Extensive bibasilar airway impaction and consolidation within the posterior basal lower lobes bilaterally, left greater than right, in keeping with changes of aspiration or multifocal infection. Superimposed bronchial wall thickening in keeping with airway inflammation. 2. Moderate emphysema. 3. Moderate multi-vessel coronary artery calcification. 4. Centrally necrotic rectal mass again identified with extensive local invasion of the presacral space and left posterolateral pelvic sidewall. Stable mesorectal lymph node metastasis. Developing interstitial gas within the primary mass may reflect changes of treatment effect and increasing necrosis and/or superimposed infection. 5. Lower quadrant diverting sigmoid loop colostomy with prominent stomal prolapse and partial large bowel obstruction with point of transition at the stomal hiatus. Moderate retained stool proximally within the colon. 6. Interval enlargement of a single pulmonary nodule along the left major fissure, indeterminate. An isolated pulmonary metastasis is of low likelihood, but not completely excluded. Close attention on a follow-up surveillance imaging is warranted. Aortic Atherosclerosis (ICD10-I70.0) and Emphysema (ICD10-J43.9). Electronically Signed   By: Fidela Salisbury M.D.   On: 01/06/2023 21:46   DG Chest Port 1 View  Result Date: 01/06/2023 CLINICAL DATA:  Possible sepsis. EXAM: PORTABLE CHEST 1 VIEW COMPARISON:  February 23, 2022 FINDINGS: Since the prior study there is been interval placement of a right-sided venous Port-A-Cath. Its distal tip is seen at the junction of the superior vena cava and right atrium. The heart size and mediastinal contours are within normal limits. Mild areas of atelectasis and/or infiltrate are seen within the bilateral lung bases. There is no  evidence of a pleural effusion or pneumothorax. Multiple chronic left-sided rib fractures are noted, with a radiopaque fixation plate and screws seen overlying the left acromion and distal left clavicle. IMPRESSION: Interval right-sided venous Port-A-Cath placement positioning, as described above, without evidence of pneumothorax. Electronically Signed   By: Virgina Norfolk M.D.   On: 01/06/2023 20:30    Scheduled Meds:  atorvastatin  40 mg Oral Daily   enoxaparin (LOVENOX) injection  40 mg Subcutaneous Q24H   feeding supplement  237 mL Oral TID BM   fentaNYL  1 patch Transdermal Q72H   gabapentin  100 mg Oral TID   multivitamin with minerals  1 tablet Oral Daily   nicotine  21 mg Transdermal Daily   pantoprazole  40 mg Oral Daily   polyethylene glycol  17 g Oral Daily   potassium chloride SA  20 mEq Oral Daily   sotalol  80 mg Oral Daily   thiamine  100 mg Oral Daily   Continuous Infusions:  0.9 % NaCl with KCl 20 mEq / L 100 mL/hr at 01/08/23 0919   azithromycin 500 mg (01/08/23 0108)   cefTRIAXone (ROCEPHIN)  IV 2 g (01/08/23 0744)   potassium PHOSPHATE IVPB (in mmol) 30 mmol (01/08/23 0521)   vancomycin 750 mg (01/08/23 0939)     LOS: 2 days    Time spent: 35 mins    Platon Arocho, MD [facility]   If 7PM-7AM, please contact night-coverage

## 2023-01-08 NOTE — Progress Notes (Signed)
Initial Nutrition Assessment  DOCUMENTATION CODES:   Severe malnutrition in context of chronic illness  INTERVENTION:   -Ensure Enlive po TID, each supplement provides 350 kcal and 20 grams of protein -MVI with minerals daily -Advance diet to dysphagia 3 diet with thin liquids  NUTRITION DIAGNOSIS:   Severe Malnutrition related to chronic illness (rectal cancer) as evidenced by severe fat depletion, severe muscle depletion.  GOAL:   Patient will meet greater than or equal to 90% of their needs  MONITOR:   PO intake, Supplement acceptance, Diet advancement  REASON FOR ASSESSMENT:   Malnutrition Screening Tool    ASSESSMENT:   Pt with medical history significant for rectal cancer on palliative chemo-radiotherapy, coronary artery disease, CVA, and tobacco abuse, who presented with acute onset of generalized weakness and generalized abdominal pain.  Pt sepsis secondary to multifocal pneumonia.    Reviewed I/O's: +80 ml x 24 hours and +330 ml since admission  UOP: 400 ml x 24 hours  Spoke with pt and wife at bedside. Pt with very hoarse voice and wife provided most of the history. Pt has experienced a general decline in health over the past few months. Pt is typically active and enjoys chopping wood, but has since become more short of breath. Pt also with decreased appetite. Noted that pt with multiple missing teeth. Prior to acute illness, wife reports pt "ate like a pig", but often consumes softer textured foods such as sandwiches and mashed potatoes. Pt consumed some gingerale and Ensure. He thinks he can try some solid foods.   Pt endorses wt loss, but unsure of UBW or how much. Reviewed wt hx; wt has been stable over the past 3 months.   Discussed importance of good meal and supplement intake to promote healing. Pt amenable to continue Ensure.   Case discussed with general surgery; received permission to advance diet.   Medications reviewed and include miralax,  potassium chloride, thiamine, and 0.9% NaCl with Kcl 20 mEq/L infusion @ 100 ml/hr.   Labs reviewed: Na: 132, K: 3.1, CBGS: 130 (inpatient orders for glycemic control are none).    NUTRITION - FOCUSED PHYSICAL EXAM:  Flowsheet Row Most Recent Value  Orbital Region Severe depletion  Upper Arm Region Severe depletion  Thoracic and Lumbar Region Severe depletion  Buccal Region Severe depletion  Temple Region Severe depletion  Clavicle Bone Region Severe depletion  Clavicle and Acromion Bone Region Severe depletion  Scapular Bone Region Severe depletion  Dorsal Hand Severe depletion  Patellar Region Severe depletion  Anterior Thigh Region Severe depletion  Posterior Calf Region Severe depletion  Edema (RD Assessment) None  Hair Reviewed  Eyes Reviewed  Mouth Reviewed  Skin Reviewed  Nails Reviewed       Diet Order:   Diet Order             DIET DYS 3 Fluid consistency: Thin  Diet effective now                   EDUCATION NEEDS:   Education needs have been addressed  Skin:  Skin Assessment: Reviewed RN Assessment  Last BM:  01/07/23 (via ostomy)  Height:   Ht Readings from Last 1 Encounters:  01/07/23 6' (1.829 m)    Weight:   Wt Readings from Last 1 Encounters:  01/07/23 62.1 kg    Ideal Body Weight:  83.6 kg  BMI:  Body mass index is 18.57 kg/m.  Estimated Nutritional Needs:   Kcal:  2200-2400  Protein:  125-150 grams  Fluid:  > 2 L    Loistine Chance, RD, LDN, [place] Registered Dietitian II Certified Diabetes Care and Education Specialist Please refer to [facility] for RD and/or RD on-call/weekend/after hours pager

## 2023-01-09 DIAGNOSIS — D701 Agranulocytosis secondary to cancer chemotherapy: Secondary | ICD-10-CM | POA: Diagnosis not present

## 2023-01-09 DIAGNOSIS — R6520 Severe sepsis without septic shock: Secondary | ICD-10-CM | POA: Diagnosis not present

## 2023-01-09 DIAGNOSIS — T451X5A Adverse effect of antineoplastic and immunosuppressive drugs, initial encounter: Secondary | ICD-10-CM | POA: Diagnosis not present

## 2023-01-09 DIAGNOSIS — F17210 Nicotine dependence, cigarettes, uncomplicated: Secondary | ICD-10-CM | POA: Diagnosis not present

## 2023-01-09 DIAGNOSIS — J9601 Acute respiratory failure with hypoxia: Secondary | ICD-10-CM | POA: Diagnosis not present

## 2023-01-09 DIAGNOSIS — J189 Pneumonia, unspecified organism: Secondary | ICD-10-CM

## 2023-01-09 DIAGNOSIS — A419 Sepsis, unspecified organism: Secondary | ICD-10-CM | POA: Diagnosis not present

## 2023-01-09 DIAGNOSIS — C20 Malignant neoplasm of rectum: Secondary | ICD-10-CM | POA: Diagnosis not present

## 2023-01-09 DIAGNOSIS — R7881 Bacteremia: Secondary | ICD-10-CM | POA: Diagnosis not present

## 2023-01-09 LAB — BASIC METABOLIC PANEL
Anion gap: 5 (ref 5–15)
BUN: 18 mg/dL (ref 8–23)
CO2: 22 mmol/L (ref 22–32)
Calcium: 7.1 mg/dL — ABNORMAL LOW (ref 8.9–10.3)
Chloride: 107 mmol/L (ref 98–111)
Creatinine, Ser: 0.45 mg/dL — ABNORMAL LOW (ref 0.61–1.24)
GFR, Estimated: >60 mL/min (ref >60)
Glucose, Bld: 94 mg/dL (ref 70–99)
Potassium: 2.9 mmol/L — ABNORMAL LOW (ref 3.5–5.1)
Sodium: 134 mmol/L — ABNORMAL LOW (ref 135–145)

## 2023-01-09 LAB — CBC
HCT: 30 % — ABNORMAL LOW (ref 39.0–52.0)
Hemoglobin: 10.4 g/dL — ABNORMAL LOW (ref 13.0–17.0)
MCH: 31.8 pg (ref 26.0–34.0)
MCHC: 34.7 g/dL (ref 30.0–36.0)
MCV: 91.7 fL (ref 80.0–100.0)
Platelets: 88 10*3/uL — ABNORMAL LOW (ref 150–400)
RBC: 3.27 MIL/uL — ABNORMAL LOW (ref 4.22–5.81)
RDW: 18.5 % — ABNORMAL HIGH (ref 11.5–15.5)
WBC: 0.7 10*3/uL — CL (ref 4.0–10.5)
nRBC: 0 % (ref 0.0–0.2)

## 2023-01-09 LAB — PHOSPHORUS: Phosphorus: 1.3 mg/dL — ABNORMAL LOW (ref 2.5–4.6)

## 2023-01-09 LAB — CULTURE, BLOOD (ROUTINE X 2)

## 2023-01-09 LAB — MAGNESIUM: Magnesium: 1.7 mg/dL (ref 1.7–2.4)

## 2023-01-09 LAB — URINE CULTURE: Culture: >=100000 — AB

## 2023-01-09 MED ORDER — POTASSIUM PHOSPHATES 15 MMOLE/5ML IV SOLN
30.0000 mmol | Freq: Once | INTRAVENOUS | Status: AC
  Administered 2023-01-09: 30 mmol via INTRAVENOUS
  Filled 2023-01-09: qty 10

## 2023-01-09 MED ORDER — SODIUM CHLORIDE 0.9 % IV SOLN
2.0000 g | INTRAVENOUS | Status: DC
  Administered 2023-01-10: 2 g via INTRAVENOUS
  Filled 2023-01-09: qty 20

## 2023-01-09 MED ORDER — POTASSIUM CHLORIDE 20 MEQ PO PACK
40.0000 meq | PACK | Freq: Once | ORAL | Status: AC
  Administered 2023-01-09: 40 meq via ORAL
  Filled 2023-01-09: qty 2

## 2023-01-09 NOTE — Progress Notes (Signed)
   Date of Admission:  01/06/2023     ID: Kyle Larson is a 67 y.o. male  Principal Problem:   Sepsis ([place]) Active Problems:   Hyponatremia   BPH (benign prostatic hyperplasia)   Adenocarcinoma of rectum (HCC)   Large bowel obstruction (HCC)   Dyslipidemia   Hypokalemia   Pancytopenia (HCC)   Chemotherapy-induced neutropenia (HCC)    Subjective: Pt says he is slightly better Very tired  Medications:   atorvastatin  40 mg Oral Daily   enoxaparin (LOVENOX) injection  40 mg Subcutaneous Q24H   feeding supplement  237 mL Oral TID BM   fentaNYL  1 patch Transdermal Q72H   gabapentin  100 mg Oral TID   multivitamin with minerals  1 tablet Oral Daily   nicotine  21 mg Transdermal Daily   pantoprazole  40 mg Oral Daily   polyethylene glycol  17 g Oral Daily   potassium chloride SA  20 mEq Oral Daily   sotalol  80 mg Oral Daily   thiamine  100 mg Oral Daily   valACYclovir  1,000 mg Oral BID    Objective: Vital signs in last 24 hours: Temp:  [96.8 F (36 C)-98.6 F (37 C)] 97.8 F (36.6 C) (02/16 0748) Pulse Rate:  [87-99] 99 (02/16 0748) Resp:  [18-20] 20 (02/16 0748) BP: (134-149)/(84-96) 148/93 (02/16 0748) SpO2:  [95 %-97 %] 97 % (02/16 0748)    PHYSICAL EXAM:  General: lethargic, weak Head: Normocephalic, without obvious abnormality, atraumatic. Eyes: Conjunctivae clear, anicteric sclerae. Pupils are equal ENT lips ulceration Neck:  symmetrical, no adenopathy, thyroid: non tender no carotid bruit and no JVD. Lungs: b/l air entry- crepts Heart: Tachycardia Abdomen: Soft,colostomy-  Extremities: atraumatic, no cyanosis. No edema. No clubbing Skin: No rashes or lesions. Or bruising Lymph: Cervical, supraclavicular normal. Neurologic: Grossly non-focal  Lab Results Recent Labs    01/08/23 0339 01/09/23 0408  WBC 0.4* 0.7*  HGB 11.6* 10.4*  HCT 32.0* 30.0*  NA 132* 134*  K 3.1* 2.9*  CL 104 107  CO2 21* 22  BUN 20 18  CREATININE 0.50* 0.45*    Liver Panel Recent Labs    01/06/23 1934  PROT 5.9*  ALBUMIN 2.6*  AST 29  ALT 20  ALKPHOS 96  BILITOT 2.1*     Assessment/Plan: Moraxella bacteremia with bibasilar  pneumonia- on ceftriaxone   Neutropenia secondary to chemo  Herpes simplex lips-  valtrex   ? Obstruction colon   Progressive rectal carcinoma on palliative FOLFORI   H/o BPH- r/o incomplete bladder emptying Klebsiella in urine culture- he is colonized with klebsiella   Thrombocytopenia   ?h/o CAD   ID will follow him peripherally this weekend- call if needed

## 2023-01-09 NOTE — Plan of Care (Signed)

## 2023-01-09 NOTE — Progress Notes (Signed)
[facility]  [phone] [phone]  ID: [name] OB: 1956-09-27  MR#: [id number]  [id number]  Patient Care Team: Kathrine Haddock, NP as PCP - General (Nurse Practitioner) Jules Husbands, MD as Consulting Physician (General Surgery) Clent Jacks, RN as Oncology Nurse Navigator Grayland Ormond, Kathlene November, MD as Consulting Physician (Oncology) [name], MD as Consulting Physician (Cardiology) Michael Boston, MD as Consulting Physician (Colon and Rectal Surgery) Lesly Rubenstein, MD as Consulting Physician (Gastroenterology) Billey Co, MD as Consulting Physician (Urology)  CHIEF COMPLAINT: 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   INTERVAL HISTORY: Patient still with significant weakness and fatigue, but states she is improving.  Shortness of breath and cough are still prevalent, but also improving.  REVIEW OF SYSTEMS:   Review of Systems  Constitutional:  Positive for malaise/fatigue. Negative for fever and weight loss.  Respiratory:  Positive for cough and shortness of breath. Negative for hemoptysis.   Cardiovascular: Negative.  Negative for chest pain and leg swelling.  Gastrointestinal: Negative.  Negative for abdominal pain, diarrhea and heartburn.  Genitourinary: Negative.  Negative for dysuria.  Musculoskeletal: Negative.  Negative for back pain.  Skin: Negative.  Negative for rash.  Neurological:  Positive for weakness. Negative for dizziness, focal weakness and headaches.  Psychiatric/Behavioral: Negative.  The patient is not nervous/anxious.     As per HPI. Otherwise, a complete review of systems is negative.  PAST MEDICAL HISTORY: Past Medical History:  Diagnosis Date   AC joint dislocation    CAD (coronary artery disease)    CVA (cerebral vascular accident) ([place])    Metatarsal bone fracture    3rd metatarsal , left foot; April 2021   Syncope    June '22    Tobacco abuse     PAST SURGICAL HISTORY: Past Surgical History:  Procedure Laterality Date   ACROMIO-CLAVICULAR JOINT REPAIR Left 09/11/2016   Procedure: ACROMIO-CLAVICULAR RECONSTRUCTION;  Surgeon: Corky Mull, MD;  Location: [facility];  Service: Orthopedics;  Laterality: Left;  clavicle   FLEXIBLE SIGMOIDOSCOPY N/A 02/24/2022   Procedure: FLEXIBLE SIGMOIDOSCOPY;  Surgeon: Lesly Rubenstein, MD;  Location: [facility];  Service: Endoscopy;  Laterality: N/A;   IR IMAGING GUIDED PORT INSERTION  03/10/2022   LEFT HEART CATH AND CORONARY ANGIOGRAPHY N/A 05/20/2021   Procedure: LEFT HEART CATH AND CORONARY ANGIOGRAPHY with coronary intervention;  Surgeon: [name], MD;  Location: [facility];  Service: Cardiovascular;  Laterality: N/A;   MANDIBLE RECONSTRUCTION     TONSILLECTOMY     AGE 67   XI ROBOTIC ASSISTED INGUINAL HERNIA REPAIR WITH MESH Right 03/26/2020   Procedure: XI ROBOTIC ASSISTED INGUINAL HERNIA REPAIR WITH MESH;  Surgeon: Herbert Pun, MD;  Location: [facility] OR[facility];  Service: General;  Laterality: Right;    FAMILY HISTORY: Family History  Problem Relation Age of Onset   Diabetes Mother    Prostate cancer Father     ADVANCED DIRECTIVES (Y/N):  @ADVDIR$ @  HEALTH MAINTENANCE: Social History   Tobacco Use   Smoking status: Some Days    Packs/day: 0.25    Years: 15.00    Total pack years: 3.75    Types: Cigarettes    Passive exposure: Current   Smokeless tobacco: Never  Vaping Use   Vaping Use: Never used  Substance Use Topics   Alcohol use: Yes    Alcohol/week: 42.0 standard drinks of alcohol    Types: 42 Cans of beer per week  Comment: 3 every other day   Drug use: No     Colonoscopy:  PAP:  Bone density:  Lipid panel:  No Known Allergies  Current Facility-Administered Medications  Medication Dose Route Frequency Provider Last Rate Last Admin   0.9 % NaCl with KCl 20 mEq/ L  infusion   Intravenous Continuous Mansy, Jan A, MD 100  mL/hr at 01/09/23 [id number] Infusion Verify at 01/09/23 [id number]   acetaminophen (TYLENOL) tablet 650 mg  650 mg Oral Q6H PRN Mansy, Jan A, MD       Or   acetaminophen (TYLENOL) suppository 650 mg  650 mg Rectal Q6H PRN Mansy, Arvella Merles, MD       ALPRAZolam Duanne Moron) tablet 0.25 mg  0.25 mg Oral QHS PRN Mansy, Jan A, MD       atorvastatin (LIPITOR) tablet 40 mg  40 mg Oral Daily Mansy, Jan A, MD   40 mg at 01/09/23 0901   [START ON 01/10/2023] cefTRIAXone (ROCEPHIN) 2 g in sodium chloride 0.9 % 100 mL IVPB  2 g Intravenous Q24H Zeigler, Dustin G, RPH       dexamethasone (DECADRON) tablet 2 mg  2 mg Oral BID PRN Mansy, Jan A, MD       enoxaparin (LOVENOX) injection 40 mg  40 mg Subcutaneous Q24H Mansy, Jan A, MD   40 mg at 01/09/23 0905   feeding supplement (ENSURE ENLIVE / ENSURE PLUS) liquid 237 mL  237 mL Oral TID BM Mansy, Jan A, MD   237 mL at 01/09/23 1420   fentaNYL (DURAGESIC) 50 MCG/HR 1 patch  1 patch Transdermal Q72H Mansy, Jan A, MD   1 patch at 01/07/23 0127   gabapentin (NEURONTIN) capsule 100 mg  100 mg Oral TID Mansy, Jan A, MD   100 mg at 01/09/23 0901   magnesium hydroxide (MILK OF MAGNESIA) suspension 30 mL  30 mL Oral Daily PRN Mansy, Jan A, MD       morphine (PF) 2 MG/ML injection 2 mg  2 mg Intravenous Q2H PRN Mansy, Jan A, MD       multivitamin with minerals tablet 1 tablet  1 tablet Oral Daily Mansy, Jan A, MD   1 tablet at 01/09/23 0901   nicotine (NICODERM CQ - dosed in mg/24 hours) patch 21 mg  21 mg Transdermal Daily Mansy, Jan A, MD   21 mg at 01/09/23 0907   ondansetron (ZOFRAN) tablet 4 mg  4 mg Oral Q6H PRN Mansy, Jan A, MD       Or   ondansetron [facility]) injection 4 mg  4 mg Intravenous Q6H PRN Mansy, Jan A, MD       oxyCODONE (Oxy IR/ROXICODONE) immediate release tablet 10 mg  10 mg Oral Q6H PRN Mansy, Jan A, MD   10 mg at 01/08/23 0930   pantoprazole (PROTONIX) EC tablet 40 mg  40 mg Oral Daily Mansy, Jan A, MD   40 mg at 01/09/23 0902   polyethylene glycol (MIRALAX / GLYCOLAX)  packet 17 g  17 g Oral Daily Mansy, Jan A, MD       potassium chloride SA (KLOR-CON M) CR tablet 20 mEq  20 mEq Oral Daily Shawna Clamp, MD   20 mEq at 01/09/23 0901   potassium PHOSPHATE 30 mmol in dextrose 5 % 500 mL infusion  30 mmol Intravenous Once Shawna Clamp, MD 85 mL/hr at 01/09/23 1421 30 mmol at 01/09/23 1421   prochlorperazine (COMPAZINE) tablet 10 mg  10 mg Oral Q6H  PRN Mansy, Jan A, MD       sotalol (BETAPACE) tablet 80 mg  80 mg Oral Daily Mansy, Jan A, MD   80 mg at 01/09/23 0902   thiamine (VITAMIN B1) tablet 100 mg  100 mg Oral Daily Mansy, Jan A, MD   100 mg at 01/09/23 0902   traZODone (DESYREL) tablet 25 mg  25 mg Oral QHS PRN Mansy, Jan A, MD       valACYclovir (VALTREX) tablet 1,000 mg  1,000 mg Oral BID Tsosie Billing, MD   1,000 mg at 01/09/23 0901    OBJECTIVE: Vitals:   01/09/23 0419 01/09/23 0748  BP: 134/84 (!) 148/93  Pulse: 87 99  Resp: 18 20  Temp: 98.6 F (37 C) 97.8 F (36.6 C)  SpO2: 96% 97%     Body mass index is 18.57 kg/m.    ECOG FS:3 - Symptomatic, >50% confined to bed  General: Ill-appearing, no acute distress. Eyes: Pink conjunctiva, anicteric sclera. HEENT: Normocephalic, moist mucous membranes. Lungs: No audible wheezing or coughing. Heart: Regular rate and rhythm. Abdomen: Soft, nontender, no obvious distention. Musculoskeletal: No edema, cyanosis, or clubbing. Neuro: Alert, answering all questions appropriately. Cranial nerves grossly intact. Skin: No rashes or petechiae noted. Psych: Flat affect.  LAB RESULTS:  Lab Results  Component Value Date   NA 134 (L) 01/09/2023   K 2.9 (L) 01/09/2023   CL 107 01/09/2023   CO2 22 01/09/2023   GLUCOSE 94 01/09/2023   BUN 18 01/09/2023   CREATININE 0.45 (L) 01/09/2023   CALCIUM 7.1 (L) 01/09/2023   PROT 5.9 (L) 01/06/2023   ALBUMIN 2.6 (L) 01/06/2023   AST 29 01/06/2023   ALT 20 01/06/2023   ALKPHOS 96 01/06/2023   BILITOT 2.1 (H) 01/06/2023   GFRNONAA >60 01/09/2023    GFRAA >60 10/03/2016    Lab Results  Component Value Date   WBC 0.7 (LL) 01/09/2023   NEUTROABS 0.0 (LL) 01/06/2023   HGB 10.4 (L) 01/09/2023   HCT 30.0 (L) 01/09/2023   MCV 91.7 01/09/2023   PLT 88 (L) 01/09/2023     STUDIES: DG Abd 2 Views  Result Date: 01/07/2023 CLINICAL DATA:  Large bowel obstruction.  Abdominal pain. EXAM: ABDOMEN - 2 VIEW COMPARISON:  CT chest abdomen pelvis 01/06/2023 and chest radiograph 01/06/2023. FINDINGS: Right IJ power port tip is in the low SVC. Heart size stable. Mild basilar hazy opacification. Small bilateral pleural effusions. Old left rib fractures. Minimal gaseous cecal distension. Bowel gas pattern is otherwise unremarkable. IMPRESSION: 1. Bibasilar aspiration or pneumonia. 2. Small bilateral pleural effusions. 3. Mild cecal distension, otherwise normal bowel gas pattern. Electronically Signed   By: Lorin Picket M.D.   On: 01/07/2023 10:20   CT CHEST ABDOMEN PELVIS W CONTRAST  Result Date: 01/06/2023 CLINICAL DATA:  Sepsis EXAM: CT CHEST, ABDOMEN, AND PELVIS WITH CONTRAST TECHNIQUE: Multidetector CT imaging of the chest, abdomen and pelvis was performed following the standard protocol during bolus administration of intravenous contrast. RADIATION DOSE REDUCTION: This exam was performed according to the departmental dose-optimization program which includes automated exposure control, adjustment of the mA and/or kV according to patient size and/or use of iterative reconstruction technique. CONTRAST:  134m OMNIPAQUE IOHEXOL 300 MG/ML  SOLN COMPARISON:  10/10/2022 FINDINGS: CT CHEST FINDINGS Cardiovascular: Moderate multi-vessel coronary artery calcification. Global cardiac size within normal limits. No pericardial effusion. Central pulmonary arteries are of normal caliber. No intraluminal filling defect through the segmental level to suggest acute pulmonary embolism. Mild atherosclerotic calcification  within the thoracic aorta. No aortic aneurysm. Right  internal jugular chest port tip noted within the superior vena cava. Mediastinum/Nodes: No pathologic thoracic adenopathy. Esophagus unremarkable. Visualized thyroid is unremarkable. Small hiatal hernia. Lungs/Pleura: Moderate emphysema. Extensive bibasilar airway impaction and consolidation within the posterior basal lower lobes bilaterally, left greater than right in keeping with changes of aspiration or multifocal infection. Superimposed bronchial wall thickening in keeping with airway inflammation. No pneumothorax. No pleural effusion. Interval enlargement of a single pulmonary nodule along the left major fissure, axial image # 96/4, measuring 5 mm (previously measuring 3 mm), indeterminate. An isolated pulmonary metastasis is of low likelihood, but not completely excluded. Musculoskeletal: Multiple remote left rib fractures noted. No acute bone abnormality. No lytic or blastic bone lesion within the thorax. CT ABDOMEN PELVIS FINDINGS Hepatobiliary: Stable tiny hypodensity within the left hepatic lobe most in keeping with a tiny hepatic cyst. Liver otherwise unremarkable. Gallbladder unremarkable. No intra or extrahepatic biliary ductal dilation. Pancreas: Unremarkable. No pancreatic ductal dilatation or surrounding inflammatory changes. Spleen: Normal in size without focal abnormality. Adrenals/Urinary Tract: The adrenal glands are unremarkable. The kidneys are normal. The bladder is mildly distended, but is otherwise unremarkable. Stomach/Bowel: Centrally necrotic rectal mass is again identified now with interstitial gas which may reflect changes of treatment effect or superimposed infection. The mass is again seen extending into the presacral space and abutting the left posterolateral pelvic sidewall in keeping with extensive local invasion. Lower quadrant diverting sigmoid loop colostomy is again identified with prominent stomal prolapse and partial large bowel obstruction with point of transition at the  stomal hiatus at axial image # 106/2 with moderate retained stool proximally within the colon. The stomach, small bowel, and large bowel are otherwise unremarkable. No free intraperitoneal gas or fluid. Vascular/Lymphatic: Moderate aortoiliac atherosclerotic calcification. No aortic aneurysm. Previously noted mesorectal lymph node metastasis is again identified at axial image # 102/2 and is stable. No new pathologic adenopathy. Reproductive: Rectal mass abuts the left seminal vesicle, unchanged from prior examination and the intervening flat pain appears obliterated, unchanged suggesting possible invasion of the structure. The prostate gland is unremarkable. Other: None Musculoskeletal: No acute bone abnormality. No lytic or blastic bone lesion. IMPRESSION: 1. Extensive bibasilar airway impaction and consolidation within the posterior basal lower lobes bilaterally, left greater than right, in keeping with changes of aspiration or multifocal infection. Superimposed bronchial wall thickening in keeping with airway inflammation. 2. Moderate emphysema. 3. Moderate multi-vessel coronary artery calcification. 4. Centrally necrotic rectal mass again identified with extensive local invasion of the presacral space and left posterolateral pelvic sidewall. Stable mesorectal lymph node metastasis. Developing interstitial gas within the primary mass may reflect changes of treatment effect and increasing necrosis and/or superimposed infection. 5. Lower quadrant diverting sigmoid loop colostomy with prominent stomal prolapse and partial large bowel obstruction with point of transition at the stomal hiatus. Moderate retained stool proximally within the colon. 6. Interval enlargement of a single pulmonary nodule along the left major fissure, indeterminate. An isolated pulmonary metastasis is of low likelihood, but not completely excluded. Close attention on a follow-up surveillance imaging is warranted. Aortic Atherosclerosis  (ICD10-I70.0) and Emphysema (ICD10-J43.9). Electronically Signed   By: Fidela Salisbury M.D.   On: 01/06/2023 21:46   DG Chest Port 1 View  Result Date: 01/06/2023 CLINICAL DATA:  Possible sepsis. EXAM: PORTABLE CHEST 1 VIEW COMPARISON:  February 23, 2022 FINDINGS: Since the prior study there is been interval placement of a right-sided venous Port-A-Cath. Its distal tip is seen  at the junction of the superior vena cava and right atrium. The heart size and mediastinal contours are within normal limits. Mild areas of atelectasis and/or infiltrate are seen within the bilateral lung bases. There is no evidence of a pleural effusion or pneumothorax. Multiple chronic left-sided rib fractures are noted, with a radiopaque fixation plate and screws seen overlying the left acromion and distal left clavicle. IMPRESSION: Interval right-sided venous Port-A-Cath placement positioning, as described above, without evidence of pneumothorax. Electronically Signed   By: Virgina Norfolk M.D.   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   PLAN:    Sepsis secondary to pneumonia: CT scan results from January 06, 2023 reviewed independently suggesting bilateral multifocal pneumonia.  Respiratory panel is negative.  1 of 2 blood cultures growing gram-negative cocci.  Patient currently on broad-spectrum antibiotics. Progressive rectal cancer: Patient last received chemotherapy with FOLFIRI and Avastin approximately 1 week ago.  Patient has been instructed to keep his previously scheduled appointment next week, but will delay chemotherapy at least 1 week.  Neutropenia: Secondary to FOLFIRI chemotherapy which patient received approximately 1 week ago.  Monitor.  Total white blood cell count now trending up.   Thrombocytopenia: Secondary to chemotherapy.  Platelets now trending up. Anemia: Chronic and unchanged.  Patient's most recent hemoglobin is 10.4.    Hypokalemia: Slightly worse today.  Replace electrolytes as needed. Hyponatremia: Improved.  Patient's most recent sodium is 134. Possible large bowel obstruction: Surgery has been consulted.  Patient is n.p.o. and being treated conservatively. Pain: Continue current narcotic regimen.  Will follow.  Lloyd Huger, MD   01/09/2023 3:43 PM

## 2023-01-09 NOTE — Progress Notes (Signed)
PROGRESS NOTE    Kyle Larson  [id number] DOB: 06-04-1956 DOA: 01/06/2023 PCP: Kathrine Haddock, NP   Brief Narrative:  This 67 yrs old male with medical history significant for rectal cancer on palliative chemo-radiotherapy, coronary artery disease, CVA, and tobacco abuse, who presented to the emergency room with acute onset of generalized weakness and generalized abdominal pain. Patient admitted to have fever and chills.  He has been having cough with inability to expectorate as well as dyspnea and wheezing.  Pertinent labs in the ED includes hyponatremia, hypokalemia, total bilirubin 5.9 , lactic acid 2.2, CBC shows leukopenia WBC 0.3,  anemia and thrombocytopenia.  Influenza, RSV, COVID PCR negative.  CT abdomen and pelvis showed extensive bibasilar airway impaction and consolidation consistent with multifocal pneumonia, Patient admitted for further evaluation and started on empiric antibiotics.  Assessment & Plan:   Principal Problem:   Sepsis ([facility]) Active Problems:   Large bowel obstruction (HCC)   Adenocarcinoma of rectum (HCC)   Hypokalemia   Hyponatremia   BPH (benign prostatic hyperplasia)   Dyslipidemia   Pancytopenia (HCC)   Chemotherapy-induced neutropenia (HCC)  Severe Sepsis secondary to Multifocal pneumonia: Acute hypoxic respiratory failure: Moraxella Bacteremia: Neutropenic fever: Patient presented with leukopenia, tachycardia, tachypnea, hypoxia , lactic acid 2.2 CT chest showed multifocal pneumonia Empirically started on IV Rocephin and Zithromax, IV vancomycin was added for being MRSA risk. Continue mucolytic therapy, bronchodilator therapy. Continue IV fluid resuscitation.  Blood cultures grew Moraxella catarrhalis, urine culture grew Klebsiella Continue neutropenic precautions. He was hypoxic requiring supplemental oxygen 3 L to improve SpO2 to 98% Continue supplemental oxygen and wean as tolerated. Infectious disease consulted, Antibiotics de-escalated  to ceftriaxone.  Large bowel obstruction: This seems to be partial as he has transition point. General surgery consulted.  Advised conservative management. Patient was kept n.p.o., started on IV pain control and IV fluid resuscitation. Patient is started on clear liquid diet, No surgical intervention needed at this point. LBO resolved. Diet resumed.  Adenocarcinoma of rectum: Continue adequate pain control. Patient is getting palliative chemo and radiation therapy. Oncology states hold chemo and radiation until acute infection resolves.  Pancytopenia: Continue to monitor CBC. Continue neutropenic precautions. Oncology is consulted.  Hyponatremia: Likely hypovolemic due to volume depletion and dehydration. Continue to monitor serum electrolytes.  Hypokalemia /Hypophosphatemia: Replaced.  Continue to monitor  BPH : Continue Flomax.  Dyslipidemia: Continue Lipitor.   DVT prophylaxis: Lovenox Code Status:Full  Family Communication: Wife at bed side. Disposition Plan:   Status is: Inpatient Remains inpatient appropriate because: Admitted for multifocal pneumonia requiring IV antibiotics.  Infectious disease and general surgery is consulted. LBO resolved.  Antibiotic de-escalated to ceftriaxone.  Electrolyte replacement in progress.  Consultants:  General surgery Infectious diseases  Procedures: None Antimicrobials:  Anti-infectives (From admission, onward)    Start     Dose/Rate Route Frequency Ordered Stop   01/10/23 0700  cefTRIAXone (ROCEPHIN) 2 g in sodium chloride 0.9 % 100 mL IVPB        2 g 200 mL/hr over 30 Minutes Intravenous Every 24 hours 01/09/23 1053     01/08/23 1800  valACYclovir (VALTREX) tablet 1,000 mg        1,000 mg Oral 2 times daily 01/08/23 1604 01/14/23 0959   01/07/23 2100  vancomycin (VANCOREADY) IVPB 750 mg/150 mL  Status:  Discontinued        750 mg 150 mL/hr over 60 Minutes Intravenous Every 12 hours 01/07/23 1220 01/08/23 1400    01/07/23 0900  vancomycin (VANCOCIN)  IVPB 1000 mg/200 mL premix  Status:  Discontinued        1,000 mg 200 mL/hr over 60 Minutes Intravenous Every 12 hours 01/07/23 0134 01/07/23 1220   01/07/23 0700  cefTRIAXone (ROCEPHIN) 2 g in sodium chloride 0.9 % 100 mL IVPB  Status:  Discontinued        2 g 200 mL/hr over 30 Minutes Intravenous Every 24 hours 01/06/23 2326 01/09/23 1053   01/07/23 0030  azithromycin (ZITHROMAX) 500 mg in sodium chloride 0.9 % 250 mL IVPB  Status:  Discontinued        500 mg 250 mL/hr over 60 Minutes Intravenous Every 24 hours 01/06/23 2326 01/08/23 1400   01/06/23 2000  vancomycin (VANCOREADY) IVPB 1500 mg/300 mL        1,500 mg 150 mL/hr over 120 Minutes Intravenous  Once 01/06/23 1958 01/06/23 2245   01/06/23 1945  ceFEPIme (MAXIPIME) 2 g in sodium chloride 0.9 % 100 mL IVPB        2 g 200 mL/hr over 30 Minutes Intravenous  Once 01/06/23 1936 01/06/23 2017   01/06/23 1945  metroNIDAZOLE (FLAGYL) IVPB 500 mg        500 mg 100 mL/hr over 60 Minutes Intravenous  Once 01/06/23 1936 01/06/23 2114      Subjective: Patient was seen and examined at bedside.  Overnight events noted.   Patient reports feeling better,  still feeling very weak and tired. He still remains on 3 L of supplemental oxygen. Wife at bed side.  Objective: Vitals:   01/08/23 2009 01/09/23 0024 01/09/23 0419 01/09/23 0748  BP: (!) 143/92 (!) 140/91 134/84 (!) 148/93  Pulse: 92 95 87 99  Resp: 19 19 18 20  $ Temp: 98.6 F (37 C) 98.6 F (37 C) 98.6 F (37 C) 97.8 F (36.6 C)  TempSrc:      SpO2:  95% 96% 97%  Weight:      Height:        Intake/Output Summary (Last 24 hours) at 01/09/2023 1125 Last data filed at 01/09/2023 0932 Gross per 24 hour  Intake 5383.05 ml  Output 1750 ml  Net 3633.05 ml   Filed Weights   01/06/23 1926 01/07/23 0757  Weight: 62.1 kg 62.1 kg    Examination:  General exam: Appears deconditioned, not in any acute distress.  Comfortable Respiratory system:  Decreased Breath sounds, respiratory effort normal, no accessory muscle use.  RR 13 Cardiovascular system: S1 & S2 heard, regular rate and rhythm, no murmur. Gastrointestinal system: Abdomen is soft, non tender, non distended, BS+  Central nervous system: Alert and oriented. No focal neurological deficits. Extremities: No edema, no cyanosis, no clubbing Skin: No rashes, lesions or ulcers Psychiatry: Judgement and insight appear normal. Mood & affect appropriate.     Data Reviewed: I have personally reviewed following labs and imaging studies  CBC: Recent Labs  Lab 01/06/23 1934 01/07/23 0315 01/08/23 0339 01/09/23 0408  WBC 0.3* 0.2* 0.4* 0.7*  NEUTROABS 0.0*  --   --   --   HGB 12.8* 11.4* 11.6* 10.4*  HCT 35.6* 33.0* 32.0* 30.0*  MCV 90.6 93.8 89.4 91.7  PLT 61* 46* 64* 88*   Basic Metabolic Panel: Recent Labs  Lab 01/06/23 1934 01/07/23 0315 01/08/23 0339 01/09/23 0408  NA 129* 130* 132* 134*  K 3.2* 2.7* 3.1* 2.9*  CL 92* 96* 104 107  CO2 25 25 21* 22  GLUCOSE 88 78 167* 94  BUN 16 16 20 18  $ CREATININE  0.64 0.59* 0.50* 0.45*  CALCIUM 7.8* 7.6* 7.3* 7.1*  MG  --  1.8 2.0 1.7  PHOS  --   --  <1.0* 1.3*   GFR: Estimated Creatinine Clearance: 80.9 mL/min (A) (by C-G formula based on SCr of 0.45 mg/dL (L)). Liver Function Tests: Recent Labs  Lab 01/06/23 1934  AST 29  ALT 20  ALKPHOS 96  BILITOT 2.1*  PROT 5.9*  ALBUMIN 2.6*   No results for input(s): "LIPASE", "AMYLASE" in the last 168 hours. No results for input(s): "AMMONIA" in the last 168 hours. Coagulation Profile: Recent Labs  Lab 01/06/23 1934 01/07/23 0315  INR 1.3* 1.3*   Cardiac Enzymes: No results for input(s): "CKTOTAL", "CKMB", "CKMBINDEX", "TROPONINI" in the last 168 hours. BNP (last 3 results) No results for input(s): "PROBNP" in the last 8760 hours. HbA1C: No results for input(s): "HGBA1C" in the last 72 hours. CBG: No results for input(s): "GLUCAP" in the last 168 hours. Lipid  Profile: No results for input(s): "CHOL", "HDL", "LDLCALC", "TRIG", "CHOLHDL", "LDLDIRECT" in the last 72 hours. Thyroid Function Tests: No results for input(s): "TSH", "T4TOTAL", "FREET4", "T3FREE", "THYROIDAB" in the last 72 hours. Anemia Panel: No results for input(s): "VITAMINB12", "FOLATE", "FERRITIN", "TIBC", "IRON", "RETICCTPCT" in the last 72 hours. Sepsis Labs: Recent Labs  Lab 01/06/23 1934 01/06/23 2140 01/07/23 0315  PROCALCITON  --   --  15.20  LATICACIDVEN 2.2* 2.1*  --     Recent Results (from the past 240 hour(s))  Blood Culture (routine x 2)     Status: Abnormal   Collection Time: 01/06/23  7:34 PM   Specimen: BLOOD  Result Value Ref Range Status   Specimen Description BLOOD BLOOD RIGHT ARM  Final   Special Requests   Final    BOTTLES DRAWN AEROBIC AND ANAEROBIC Blood Culture results may not be optimal due to an inadequate volume of blood received in culture bottles   Culture  Setup Time   Final    GRAM NEGATIVE COCCI AEROBIC BOTTLE ONLY CRITICAL RESULT CALLED TO, READ BACK BY AND VERIFIED WITH: KRISTIN MERRILL 01/07/23 1836 MU    Culture (A)  Final    MORAXELLA CATARRHALIS(BRANHAMELLA) BETA LACTAMASE POSITIVE    Report Status 01/09/2023 FINAL  Final  Blood Culture (routine x 2)     Status: None (Preliminary result)   Collection Time: 01/06/23  7:34 PM   Specimen: BLOOD  Result Value Ref Range Status   Specimen Description BLOOD BLOOD LEFT ARM  Final   Special Requests   Final    BOTTLES DRAWN AEROBIC AND ANAEROBIC Blood Culture adequate volume   Culture   Final    NO GROWTH 3 DAYS Performed at [facility], [address], [place],  [address]    Report Status PENDING  Incomplete  Resp panel by RT-PCR (RSV, Flu A&B, Covid) Anterior Nasal Swab     Status: None   Collection Time: 01/06/23  7:34 PM   Specimen: Anterior Nasal Swab  Result Value Ref Range Status   SARS Coronavirus 2 by RT PCR NEGATIVE NEGATIVE Final    Comment:  (NOTE) SARS-CoV-2 target nucleic acids are NOT DETECTED.  The SARS-CoV-2 RNA is generally detectable in upper respiratory specimens during the acute phase of infection. The lowest concentration of SARS-CoV-2 viral copies this assay can detect is 138 copies/mL. A negative result does not preclude SARS-Cov-2 infection and should not be used as the sole basis for treatment or other patient management decisions. A negative result may occur  with  improper specimen collection/handling, submission of specimen other than nasopharyngeal swab, presence of viral mutation(s) within the areas targeted by this assay, and inadequate number of viral copies(<138 copies/mL). A negative result must be combined with clinical observations, patient history, and epidemiological information. The expected result is Negative.  Fact Sheet for Patients:  EntrepreneurPulse.com.au  Fact Sheet for Healthcare Providers:  IncredibleEmployment.be  This test is no t yet approved or cleared by the [place] FDA and  has been authorized for detection and/or diagnosis of SARS-CoV-2 by FDA under an Emergency Use Authorization (EUA). This EUA will remain  in effect (meaning this test can be used) for the duration of the COVID-19 declaration under Section 564(b)(1) of the Act, 21 U.S.C.section 360bbb-3(b)(1), unless the authorization is terminated  or revoked sooner.       Influenza A by PCR NEGATIVE NEGATIVE Final   Influenza B by PCR NEGATIVE NEGATIVE Final    Comment: (NOTE) The Xpert Xpress SARS-CoV-2/FLU/RSV plus assay is intended as an aid in the diagnosis of influenza from Nasopharyngeal swab specimens and should not be used as a sole basis for treatment. Nasal washings and aspirates are unacceptable for Xpert Xpress SARS-CoV-2/FLU/RSV testing.  Fact Sheet for Patients: EntrepreneurPulse.com.au  Fact Sheet for Healthcare  Providers: IncredibleEmployment.be  This test is not yet approved or cleared by the [place] FDA and has been authorized for detection and/or diagnosis of SARS-CoV-2 by FDA under an Emergency Use Authorization (EUA). This EUA will remain in effect (meaning this test can be used) for the duration of the COVID-19 declaration under Section 564(b)(1) of the Act, 21 U.S.C. section 360bbb-3(b)(1), unless the authorization is terminated or revoked.     Resp Syncytial Virus by PCR NEGATIVE NEGATIVE Final    Comment: (NOTE) Fact Sheet for Patients: EntrepreneurPulse.com.au  Fact Sheet for Healthcare Providers: IncredibleEmployment.be  This test is not yet approved or cleared by the [place] FDA and has been authorized for detection and/or diagnosis of SARS-CoV-2 by FDA under an Emergency Use Authorization (EUA). This EUA will remain in effect (meaning this test can be used) for the duration of the COVID-19 declaration under Section 564(b)(1) of the Act, 21 U.S.C. section 360bbb-3(b)(1), unless the authorization is terminated or revoked.  Performed at [facility], [address] [place], [place] [address]   Blood Culture ID Panel (Reflexed)     Status: None   Collection Time: 01/06/23  7:34 PM  Result Value Ref Range Status   Enterococcus faecalis NOT DETECTED NOT DETECTED Final   Enterococcus Faecium NOT DETECTED NOT DETECTED Final   Listeria monocytogenes NOT DETECTED NOT DETECTED Final   Staphylococcus species NOT DETECTED NOT DETECTED Final   Staphylococcus aureus (BCID) NOT DETECTED NOT DETECTED Final   Staphylococcus epidermidis NOT DETECTED NOT DETECTED Final   Staphylococcus lugdunensis NOT DETECTED NOT DETECTED Final   Streptococcus species NOT DETECTED NOT DETECTED Final   Streptococcus agalactiae NOT DETECTED NOT DETECTED Final   Streptococcus pneumoniae NOT DETECTED NOT DETECTED Final    Streptococcus pyogenes NOT DETECTED NOT DETECTED Final   A.calcoaceticus-baumannii NOT DETECTED NOT DETECTED Final   Bacteroides fragilis NOT DETECTED NOT DETECTED Final   Enterobacterales NOT DETECTED NOT DETECTED Final   Enterobacter cloacae complex NOT DETECTED NOT DETECTED Final   Escherichia coli NOT DETECTED NOT DETECTED Final   Klebsiella aerogenes NOT DETECTED NOT DETECTED Final   Klebsiella oxytoca NOT DETECTED NOT DETECTED Final   Klebsiella pneumoniae NOT DETECTED NOT DETECTED Final   Proteus species  NOT DETECTED NOT DETECTED Final   Salmonella species NOT DETECTED NOT DETECTED Final   Serratia marcescens NOT DETECTED NOT DETECTED Final   Haemophilus influenzae NOT DETECTED NOT DETECTED Final   Neisseria meningitidis NOT DETECTED NOT DETECTED Final   Pseudomonas aeruginosa NOT DETECTED NOT DETECTED Final   Stenotrophomonas maltophilia NOT DETECTED NOT DETECTED Final   Candida albicans NOT DETECTED NOT DETECTED Final   Candida auris NOT DETECTED NOT DETECTED Final   Candida glabrata NOT DETECTED NOT DETECTED Final   Candida krusei NOT DETECTED NOT DETECTED Final   Candida parapsilosis NOT DETECTED NOT DETECTED Final   Candida tropicalis NOT DETECTED NOT DETECTED Final   Cryptococcus neoformans/gattii NOT DETECTED NOT DETECTED Final    Comment: Performed at [facility], [address], [place], [place] [address]  Urine Culture     Status: Abnormal   Collection Time: 01/06/23  9:40 PM   Specimen: Urine, Random  Result Value Ref Range Status   Specimen Description   Final    URINE, RANDOM Performed at [facility], [address] [place], [place] [address]    Special Requests   Final    NONE Performed at [facility], [place], [place] [address]    Culture >=100,000 COLONIES/mL KLEBSIELLA PNEUMONIAE (A)  Final   Report Status 01/09/2023 FINAL  Final   Organism ID, Bacteria KLEBSIELLA PNEUMONIAE (A)  Final      Susceptibility    Klebsiella pneumoniae - MIC*    AMPICILLIN RESISTANT Resistant     CEFAZOLIN <=4 SENSITIVE Sensitive     CEFEPIME <=0.12 SENSITIVE Sensitive     CEFTRIAXONE <=0.25 SENSITIVE Sensitive     CIPROFLOXACIN <=0.25 SENSITIVE Sensitive     GENTAMICIN <=1 SENSITIVE Sensitive     IMIPENEM <=0.25 SENSITIVE Sensitive     NITROFURANTOIN <=16 SENSITIVE Sensitive     TRIMETH/SULFA <=20 SENSITIVE Sensitive     AMPICILLIN/SULBACTAM 4 SENSITIVE Sensitive     PIP/TAZO <=4 SENSITIVE Sensitive     * >=100,000 COLONIES/mL KLEBSIELLA PNEUMONIAE  MRSA Next Gen by PCR, Nasal     Status: None   Collection Time: 01/08/23 11:07 AM   Specimen: Nasal Mucosa; Nasal Swab  Result Value Ref Range Status   MRSA by PCR Next Gen NOT DETECTED NOT DETECTED Final    Comment: (NOTE) The GeneXpert MRSA Assay (FDA approved for NASAL specimens only), is one component of a comprehensive MRSA colonization surveillance program. It is not intended to diagnose MRSA infection nor to guide or monitor treatment for MRSA infections. Test performance is not FDA approved in patients less than 76 years old. Performed at [facility], [place], [place] [address]   Respiratory (~20 pathogens) panel by PCR     Status: None   Collection Time: 01/08/23  1:56 PM   Specimen: Nasopharyngeal Swab; Respiratory  Result Value Ref Range Status   Adenovirus NOT DETECTED NOT DETECTED Final   Coronavirus 229E NOT DETECTED NOT DETECTED Final    Comment: (NOTE) The Coronavirus on the Respiratory Panel, DOES NOT test for the novel  Coronavirus (2019 nCoV)    Coronavirus HKU1 NOT DETECTED NOT DETECTED Final   Coronavirus NL63 NOT DETECTED NOT DETECTED Final   Coronavirus OC43 NOT DETECTED NOT DETECTED Final   Metapneumovirus NOT DETECTED NOT DETECTED Final   Rhinovirus / Enterovirus NOT DETECTED NOT DETECTED Final   Influenza A NOT DETECTED NOT DETECTED Final   Influenza B NOT DETECTED NOT DETECTED Final  Parainfluenza Virus 1 NOT DETECTED NOT DETECTED Final   Parainfluenza Virus 2 NOT DETECTED NOT DETECTED Final   Parainfluenza Virus 3 NOT DETECTED NOT DETECTED Final   Parainfluenza Virus 4 NOT DETECTED NOT DETECTED Final   Respiratory Syncytial Virus NOT DETECTED NOT DETECTED Final   Bordetella pertussis NOT DETECTED NOT DETECTED Final   Bordetella Parapertussis NOT DETECTED NOT DETECTED Final   Chlamydophila pneumoniae NOT DETECTED NOT DETECTED Final   Mycoplasma pneumoniae NOT DETECTED NOT DETECTED Final    Comment: Performed at [facility], [place] [address] [place], [place] [address]    Radiology Studies: No results found.  Scheduled Meds:  atorvastatin  40 mg Oral Daily   enoxaparin (LOVENOX) injection  40 mg Subcutaneous Q24H   feeding supplement  237 mL Oral TID BM   fentaNYL  1 patch Transdermal Q72H   gabapentin  100 mg Oral TID   multivitamin with minerals  1 tablet Oral Daily   nicotine  21 mg Transdermal Daily   pantoprazole  40 mg Oral Daily   polyethylene glycol  17 g Oral Daily   potassium chloride SA  20 mEq Oral Daily   sotalol  80 mg Oral Daily   thiamine  100 mg Oral Daily   valACYclovir  1,000 mg Oral BID   Continuous Infusions:  0.9 % NaCl with KCl 20 mEq / L 100 mL/hr at 01/09/23 0637   [START ON 01/10/2023] cefTRIAXone (ROCEPHIN)  IV     potassium PHOSPHATE IVPB (in mmol)       LOS: 3 days    Time spent: 35 mins    Bohden Dung, MD [facility]   If 7PM-7AM, please contact night-coverage

## 2023-01-09 NOTE — Care Management Important Message (Signed)
Important Message  Patient Details  Name: Duy Strawther MRN: [id number] Date of Birth: 04/30/1956   Medicare Important Message Given:  Yes     Juliann Pulse A Espen Bethel 01/09/2023, 11:26 AM

## 2023-01-10 DIAGNOSIS — R6520 Severe sepsis without septic shock: Secondary | ICD-10-CM | POA: Diagnosis not present

## 2023-01-10 DIAGNOSIS — A419 Sepsis, unspecified organism: Secondary | ICD-10-CM | POA: Diagnosis not present

## 2023-01-10 DIAGNOSIS — J9601 Acute respiratory failure with hypoxia: Secondary | ICD-10-CM | POA: Diagnosis not present

## 2023-01-10 LAB — PHOSPHORUS: Phosphorus: 1.5 mg/dL — ABNORMAL LOW (ref 2.5–4.6)

## 2023-01-10 LAB — CBC
HCT: 28.8 % — ABNORMAL LOW (ref 39.0–52.0)
Hemoglobin: 10.1 g/dL — ABNORMAL LOW (ref 13.0–17.0)
MCH: 32.3 pg (ref 26.0–34.0)
MCHC: 35.1 g/dL (ref 30.0–36.0)
MCV: 92 fL (ref 80.0–100.0)
Platelets: 141 10*3/uL — ABNORMAL LOW (ref 150–400)
RBC: 3.13 MIL/uL — ABNORMAL LOW (ref 4.22–5.81)
RDW: 18.9 % — ABNORMAL HIGH (ref 11.5–15.5)
WBC: 2.2 10*3/uL — ABNORMAL LOW (ref 4.0–10.5)
nRBC: 0 % (ref 0.0–0.2)

## 2023-01-10 LAB — BASIC METABOLIC PANEL
Anion gap: 6 (ref 5–15)
BUN: 18 mg/dL (ref 8–23)
CO2: 21 mmol/L — ABNORMAL LOW (ref 22–32)
Calcium: 7.1 mg/dL — ABNORMAL LOW (ref 8.9–10.3)
Chloride: 107 mmol/L (ref 98–111)
Creatinine, Ser: 0.48 mg/dL — ABNORMAL LOW (ref 0.61–1.24)
GFR, Estimated: >60 mL/min (ref >60)
Glucose, Bld: 110 mg/dL — ABNORMAL HIGH (ref 70–99)
Potassium: 3.1 mmol/L — ABNORMAL LOW (ref 3.5–5.1)
Sodium: 134 mmol/L — ABNORMAL LOW (ref 135–145)

## 2023-01-10 LAB — MAGNESIUM: Magnesium: 1.7 mg/dL (ref 1.7–2.4)

## 2023-01-10 MED ORDER — POTASSIUM CHLORIDE 20 MEQ PO PACK
40.0000 meq | PACK | Freq: Once | ORAL | Status: AC
  Administered 2023-01-10: 40 meq via ORAL
  Filled 2023-01-10: qty 2

## 2023-01-10 MED ORDER — POTASSIUM PHOSPHATES 15 MMOLE/5ML IV SOLN
30.0000 mmol | Freq: Once | INTRAVENOUS | Status: DC
  Filled 2023-01-10: qty 10

## 2023-01-10 NOTE — Progress Notes (Signed)
PROGRESS NOTE    Kyle Larson  [id number] DOB: 09-13-1956 DOA: 01/06/2023 PCP: Kathrine Haddock, NP   Brief Narrative:  This 67 yrs old male with medical history significant for rectal cancer on palliative chemo-radiotherapy, coronary artery disease, CVA, and tobacco abuse, who presented to the emergency room with acute onset of generalized weakness and generalized abdominal pain. Patient admitted to have fever and chills.  He has been having cough with inability to expectorate as well as dyspnea and wheezing.  Pertinent labs in the ED includes hyponatremia, hypokalemia, total bilirubin 5.9 , lactic acid 2.2, CBC shows leukopenia WBC 0.3,  anemia and thrombocytopenia.  Influenza, RSV, COVID PCR negative.  CT abdomen and pelvis showed extensive bibasilar airway impaction and consolidation consistent with multifocal pneumonia, Patient admitted for further evaluation and started on empiric antibiotics.  Assessment & Plan:   Principal Problem:   Sepsis ([place]) Active Problems:   Large bowel obstruction (HCC)   Adenocarcinoma of rectum (HCC)   Hypokalemia   Hyponatremia   BPH (benign prostatic hyperplasia)   Dyslipidemia   Pancytopenia (HCC)   Chemotherapy-induced neutropenia (HCC)   Bacteremia due to Gram-negative bacteria   Multifocal pneumonia  Severe Sepsis secondary to Multifocal pneumonia: Acute hypoxic respiratory failure: Moraxella Bacteremia: Neutropenic fever: Patient presented with leukopenia, tachycardia, tachypnea, hypoxia , lactic acid 2.2 CT chest showed multifocal pneumonia Empirically started on IV Rocephin and Zithromax, IV vancomycin was added for being MRSA risk. Continue mucolytic therapy, bronchodilator therapy. Continue IV fluid resuscitation.  Blood cultures grew Moraxella catarrhalis, urine culture grew Klebsiella Continue neutropenic precautions. He was hypoxic requiring supplemental oxygen 3 L to improve SpO2 to 98% Continue supplemental oxygen and wean as  tolerated. Infectious disease consulted, Antibiotics de-escalated to ceftriaxone. Continue ceftriaxone for now.  Large bowel obstruction: This seems to be partial as he has transition point. General surgery consulted.  Advised conservative management. Patient was kept n.p.o., started on IV pain control and IV fluid resuscitation. Patient is started on clear liquid diet, No surgical intervention needed at this point. LBO resolved. Diet resumed.  Adenocarcinoma of rectum: Continue adequate pain control. Patient is getting palliative chemo and radiation therapy. Oncology states hold chemo and radiation until acute infection resolves.  Pancytopenia: Continue to monitor CBC. Continue neutropenic precautions. Oncology is consulted.  Hyponatremia: Likely hypovolemic due to volume depletion and dehydration. Continue to monitor serum electrolytes.  Hypokalemia /Hypophosphatemia: Replaced.  Continue to monitor  BPH : Continue Flomax.  Dyslipidemia: Continue Lipitor.   DVT prophylaxis: Lovenox Code Status:Full  Family Communication: Wife at bed side. Disposition Plan:   Status is: Inpatient Remains inpatient appropriate because: Admitted for multifocal pneumonia requiring IV antibiotics.  Infectious disease and general surgery is consulted. LBO resolved.  Antibiotic de-escalated to ceftriaxone.  Electrolyte replacement in progress.  Consultants:  General surgery Infectious diseases  Procedures: None Antimicrobials:  Anti-infectives (From admission, onward)    Start     Dose/Rate Route Frequency Ordered Stop   01/10/23 0700  cefTRIAXone (ROCEPHIN) 2 g in sodium chloride 0.9 % 100 mL IVPB        2 g 200 mL/hr over 30 Minutes Intravenous Every 24 hours 01/09/23 1053     01/08/23 1800  valACYclovir (VALTREX) tablet 1,000 mg        1,000 mg Oral 2 times daily 01/08/23 1604 01/14/23 0959   01/07/23 2100  vancomycin (VANCOREADY) IVPB 750 mg/150 mL  Status:  Discontinued         750 mg 150 mL/hr over 60 Minutes  Intravenous Every 12 hours 01/07/23 1220 01/08/23 1400   01/07/23 0900  vancomycin (VANCOCIN) IVPB 1000 mg/200 mL premix  Status:  Discontinued        1,000 mg 200 mL/hr over 60 Minutes Intravenous Every 12 hours 01/07/23 0134 01/07/23 1220   01/07/23 0700  cefTRIAXone (ROCEPHIN) 2 g in sodium chloride 0.9 % 100 mL IVPB  Status:  Discontinued        2 g 200 mL/hr over 30 Minutes Intravenous Every 24 hours 01/06/23 2326 01/09/23 1053   01/07/23 0030  azithromycin (ZITHROMAX) 500 mg in sodium chloride 0.9 % 250 mL IVPB  Status:  Discontinued        500 mg 250 mL/hr over 60 Minutes Intravenous Every 24 hours 01/06/23 2326 01/08/23 1400   01/06/23 2000  vancomycin (VANCOREADY) IVPB 1500 mg/300 mL        1,500 mg 150 mL/hr over 120 Minutes Intravenous  Once 01/06/23 1958 01/06/23 2245   01/06/23 1945  ceFEPIme (MAXIPIME) 2 g in sodium chloride 0.9 % 100 mL IVPB        2 g 200 mL/hr over 30 Minutes Intravenous  Once 01/06/23 1936 01/06/23 2017   01/06/23 1945  metroNIDAZOLE (FLAGYL) IVPB 500 mg        500 mg 100 mL/hr over 60 Minutes Intravenous  Once 01/06/23 1936 01/06/23 2114      Subjective: Patient was seen and examined at bedside.  Overnight events noted.   Patient reports doing better,  stated he wants to be discharged.   Explained in detail that we are waiting on sensitivity for the culture sent. He still remains on 3 L of supplemental oxygen. Wife at bed side.  Objective: Vitals:   01/09/23 1643 01/09/23 2211 01/10/23 0530 01/10/23 0814  BP: 133/89 119/85 118/84 125/80  Pulse: 91 99 97 93  Resp: 18 16 16 16  $ Temp: 98 F (36.7 C) 98.1 F (36.7 C) 98.7 F (37.1 C) 98 F (36.7 C)  TempSrc:      SpO2: 97% 95% 96% 95%  Weight:      Height:        Intake/Output Summary (Last 24 hours) at 01/10/2023 1449 Last data filed at 01/10/2023 1123 Gross per 24 hour  Intake --  Output 2000 ml  Net -2000 ml   Filed Weights   01/06/23 1926  01/07/23 0757  Weight: 62.1 kg 62.1 kg    Examination:  General exam: Appears comfortable, not in any acute distress, deconditioned. Respiratory system: CTA bilaterally, respiratory effort normal, no accessory muscle use.  RR 12 Cardiovascular system: S1 & S2 heard, regular rate and rhythm, no murmur. Gastrointestinal system: Abdomen is soft, non tender, non distended, BS+  Central nervous system: Alert and oriented. No focal neurological deficits. Extremities: No edema, no cyanosis, no clubbing Skin: No rashes, lesions or ulcers Psychiatry: Judgement and insight appear normal. Mood & affect appropriate.     Data Reviewed: I have personally reviewed following labs and imaging studies  CBC: Recent Labs  Lab 01/06/23 1934 01/07/23 0315 01/08/23 0339 01/09/23 0408 01/10/23 0525  WBC 0.3* 0.2* 0.4* 0.7* 2.2*  NEUTROABS 0.0*  --   --   --   --   HGB 12.8* 11.4* 11.6* 10.4* 10.1*  HCT 35.6* 33.0* 32.0* 30.0* 28.8*  MCV 90.6 93.8 89.4 91.7 92.0  PLT 61* 46* 64* 88* [id number]*   Basic Metabolic Panel: Recent Labs  Lab 01/06/23 1934 01/07/23 0315 01/08/23 0339 01/09/23 0408 01/10/23 0525  NA  129* 130* 132* 134* 134*  K 3.2* 2.7* 3.1* 2.9* 3.1*  CL 92* 96* 104 107 107  CO2 25 25 21* 22 21*  GLUCOSE 88 78 167* 94 110*  BUN 16 16 20 18 18  $ CREATININE 0.64 0.59* 0.50* 0.45* 0.48*  CALCIUM 7.8* 7.6* 7.3* 7.1* 7.1*  MG  --  1.8 2.0 1.7 1.7  PHOS  --   --  <1.0* 1.3* 1.5*   GFR: Estimated Creatinine Clearance: 80.9 mL/min (A) (by C-G formula based on SCr of 0.48 mg/dL (L)). Liver Function Tests: Recent Labs  Lab 01/06/23 1934  AST 29  ALT 20  ALKPHOS 96  BILITOT 2.1*  PROT 5.9*  ALBUMIN 2.6*   No results for input(s): "LIPASE", "AMYLASE" in the last 168 hours. No results for input(s): "AMMONIA" in the last 168 hours. Coagulation Profile: Recent Labs  Lab 01/06/23 1934 01/07/23 0315  INR 1.3* 1.3*   Cardiac Enzymes: No results for input(s): "CKTOTAL", "CKMB",  "CKMBINDEX", "TROPONINI" in the last 168 hours. BNP (last 3 results) No results for input(s): "PROBNP" in the last 8760 hours. HbA1C: No results for input(s): "HGBA1C" in the last 72 hours. CBG: No results for input(s): "GLUCAP" in the last 168 hours. Lipid Profile: No results for input(s): "CHOL", "HDL", "LDLCALC", "TRIG", "CHOLHDL", "LDLDIRECT" in the last 72 hours. Thyroid Function Tests: No results for input(s): "TSH", "T4TOTAL", "FREET4", "T3FREE", "THYROIDAB" in the last 72 hours. Anemia Panel: No results for input(s): "VITAMINB12", "FOLATE", "FERRITIN", "TIBC", "IRON", "RETICCTPCT" in the last 72 hours. Sepsis Labs: Recent Labs  Lab 01/06/23 1934 01/06/23 2140 01/07/23 0315  PROCALCITON  --   --  15.20  LATICACIDVEN 2.2* 2.1*  --     Recent Results (from the past 240 hour(s))  Blood Culture (routine x 2)     Status: Abnormal   Collection Time: 01/06/23  7:34 PM   Specimen: BLOOD  Result Value Ref Range Status   Specimen Description BLOOD BLOOD RIGHT ARM  Final   Special Requests   Final    BOTTLES DRAWN AEROBIC AND ANAEROBIC Blood Culture results may not be optimal due to an inadequate volume of blood received in culture bottles   Culture  Setup Time   Final    GRAM NEGATIVE COCCI AEROBIC BOTTLE ONLY CRITICAL RESULT CALLED TO, READ BACK BY AND VERIFIED WITH: KRISTIN MERRILL 01/07/23 1836 MU    Culture (A)  Final    MORAXELLA CATARRHALIS(BRANHAMELLA) BETA LACTAMASE POSITIVE    Report Status 01/09/2023 FINAL  Final  Blood Culture (routine x 2)     Status: None (Preliminary result)   Collection Time: 01/06/23  7:34 PM   Specimen: BLOOD  Result Value Ref Range Status   Specimen Description BLOOD BLOOD LEFT ARM  Final   Special Requests   Final    BOTTLES DRAWN AEROBIC AND ANAEROBIC Blood Culture adequate volume   Culture   Final    NO GROWTH 4 DAYS Performed at [facility], [address], [place], [place] [address]    Report Status PENDING  Incomplete   Resp panel by RT-PCR (RSV, Flu A&B, Covid) Anterior Nasal Swab     Status: None   Collection Time: 01/06/23  7:34 PM   Specimen: Anterior Nasal Swab  Result Value Ref Range Status   SARS Coronavirus 2 by RT PCR NEGATIVE NEGATIVE Final    Comment: (NOTE) SARS-CoV-2 target nucleic acids are NOT DETECTED.  The SARS-CoV-2 RNA is generally detectable in upper respiratory specimens during the acute  phase of infection. The lowest concentration of SARS-CoV-2 viral copies this assay can detect is 138 copies/mL. A negative result does not preclude SARS-Cov-2 infection and should not be used as the sole basis for treatment or other patient management decisions. A negative result may occur with  improper specimen collection/handling, submission of specimen other than nasopharyngeal swab, presence of viral mutation(s) within the areas targeted by this assay, and inadequate number of viral copies(<138 copies/mL). A negative result must be combined with clinical observations, patient history, and epidemiological information. The expected result is Negative.  Fact Sheet for Patients:  EntrepreneurPulse.com.au  Fact Sheet for Healthcare Providers:  IncredibleEmployment.be  This test is no t yet approved or cleared by the [place] FDA and  has been authorized for detection and/or diagnosis of SARS-CoV-2 by FDA under an Emergency Use Authorization (EUA). This EUA will remain  in effect (meaning this test can be used) for the duration of the COVID-19 declaration under Section 564(b)(1) of the Act, 21 U.S.C.section 360bbb-3(b)(1), unless the authorization is terminated  or revoked sooner.       Influenza A by PCR NEGATIVE NEGATIVE Final   Influenza B by PCR NEGATIVE NEGATIVE Final    Comment: (NOTE) The Xpert Xpress SARS-CoV-2/FLU/RSV plus assay is intended as an aid in the diagnosis of influenza from Nasopharyngeal swab specimens and should not be used  as a sole basis for treatment. Nasal washings and aspirates are unacceptable for Xpert Xpress SARS-CoV-2/FLU/RSV testing.  Fact Sheet for Patients: EntrepreneurPulse.com.au  Fact Sheet for Healthcare Providers: IncredibleEmployment.be  This test is not yet approved or cleared by the [place] FDA and has been authorized for detection and/or diagnosis of SARS-CoV-2 by FDA under an Emergency Use Authorization (EUA). This EUA will remain in effect (meaning this test can be used) for the duration of the COVID-19 declaration under Section 564(b)(1) of the Act, 21 U.S.C. section 360bbb-3(b)(1), unless the authorization is terminated or revoked.     Resp Syncytial Virus by PCR NEGATIVE NEGATIVE Final    Comment: (NOTE) Fact Sheet for Patients: EntrepreneurPulse.com.au  Fact Sheet for Healthcare Providers: IncredibleEmployment.be  This test is not yet approved or cleared by the [place] FDA and has been authorized for detection and/or diagnosis of SARS-CoV-2 by FDA under an Emergency Use Authorization (EUA). This EUA will remain in effect (meaning this test can be used) for the duration of the COVID-19 declaration under Section 564(b)(1) of the Act, 21 U.S.C. section 360bbb-3(b)(1), unless the authorization is terminated or revoked.  Performed at [facility], [address] [place], [place] [address]   Blood Culture ID Panel (Reflexed)     Status: None   Collection Time: 01/06/23  7:34 PM  Result Value Ref Range Status   Enterococcus faecalis NOT DETECTED NOT DETECTED Final   Enterococcus Faecium NOT DETECTED NOT DETECTED Final   Listeria monocytogenes NOT DETECTED NOT DETECTED Final   Staphylococcus species NOT DETECTED NOT DETECTED Final   Staphylococcus aureus (BCID) NOT DETECTED NOT DETECTED Final   Staphylococcus epidermidis NOT DETECTED NOT DETECTED Final   Staphylococcus  lugdunensis NOT DETECTED NOT DETECTED Final   Streptococcus species NOT DETECTED NOT DETECTED Final   Streptococcus agalactiae NOT DETECTED NOT DETECTED Final   Streptococcus pneumoniae NOT DETECTED NOT DETECTED Final   Streptococcus pyogenes NOT DETECTED NOT DETECTED Final   A.calcoaceticus-baumannii NOT DETECTED NOT DETECTED Final   Bacteroides fragilis NOT DETECTED NOT DETECTED Final   Enterobacterales NOT DETECTED NOT DETECTED Final   Enterobacter cloacae  complex NOT DETECTED NOT DETECTED Final   Escherichia coli NOT DETECTED NOT DETECTED Final   Klebsiella aerogenes NOT DETECTED NOT DETECTED Final   Klebsiella oxytoca NOT DETECTED NOT DETECTED Final   Klebsiella pneumoniae NOT DETECTED NOT DETECTED Final   Proteus species NOT DETECTED NOT DETECTED Final   Salmonella species NOT DETECTED NOT DETECTED Final   Serratia marcescens NOT DETECTED NOT DETECTED Final   Haemophilus influenzae NOT DETECTED NOT DETECTED Final   Neisseria meningitidis NOT DETECTED NOT DETECTED Final   Pseudomonas aeruginosa NOT DETECTED NOT DETECTED Final   Stenotrophomonas maltophilia NOT DETECTED NOT DETECTED Final   Candida albicans NOT DETECTED NOT DETECTED Final   Candida auris NOT DETECTED NOT DETECTED Final   Candida glabrata NOT DETECTED NOT DETECTED Final   Candida krusei NOT DETECTED NOT DETECTED Final   Candida parapsilosis NOT DETECTED NOT DETECTED Final   Candida tropicalis NOT DETECTED NOT DETECTED Final   Cryptococcus neoformans/gattii NOT DETECTED NOT DETECTED Final    Comment: Performed at [facility], [address] [place], [place] [address]  Urine Culture     Status: Abnormal   Collection Time: 01/06/23  9:40 PM   Specimen: Urine, Random  Result Value Ref Range Status   Specimen Description   Final    URINE, RANDOM Performed at [facility], [address] [place], [place] [address]    Special Requests   Final    NONE Performed at [facility], [address], [place], [place] [address]    Culture >=100,000 COLONIES/mL KLEBSIELLA PNEUMONIAE (A)  Final   Report Status 01/09/2023 FINAL  Final   Organism ID, Bacteria KLEBSIELLA PNEUMONIAE (A)  Final      Susceptibility   Klebsiella pneumoniae - MIC*    AMPICILLIN RESISTANT Resistant     CEFAZOLIN <=4 SENSITIVE Sensitive     CEFEPIME <=0.12 SENSITIVE Sensitive     CEFTRIAXONE <=0.25 SENSITIVE Sensitive     CIPROFLOXACIN <=0.25 SENSITIVE Sensitive     GENTAMICIN <=1 SENSITIVE Sensitive     IMIPENEM <=0.25 SENSITIVE Sensitive     NITROFURANTOIN <=16 SENSITIVE Sensitive     TRIMETH/SULFA <=20 SENSITIVE Sensitive     AMPICILLIN/SULBACTAM 4 SENSITIVE Sensitive     PIP/TAZO <=4 SENSITIVE Sensitive     * >=100,000 COLONIES/mL KLEBSIELLA PNEUMONIAE  MRSA Next Gen by PCR, Nasal     Status: None   Collection Time: 01/08/23 11:07 AM   Specimen: Nasal Mucosa; Nasal Swab  Result Value Ref Range Status   MRSA by PCR Next Gen NOT DETECTED NOT DETECTED Final    Comment: (NOTE) The GeneXpert MRSA Assay (FDA approved for NASAL specimens only), is one component of a comprehensive MRSA colonization surveillance program. It is not intended to diagnose MRSA infection nor to guide or monitor treatment for MRSA infections. Test performance is not FDA approved in patients less than 63 years old. Performed at [facility], [place], [place] [address]   Respiratory (~20 pathogens) panel by PCR     Status: None   Collection Time: 01/08/23  1:56 PM   Specimen: Nasopharyngeal Swab; Respiratory  Result Value Ref Range Status   Adenovirus NOT DETECTED NOT DETECTED Final   Coronavirus 229E NOT DETECTED NOT DETECTED Final    Comment: (NOTE) The Coronavirus on the Respiratory Panel, DOES NOT test for the novel  Coronavirus (2019 nCoV)    Coronavirus HKU1 NOT DETECTED NOT DETECTED Final   Coronavirus NL63 NOT DETECTED NOT DETECTED Final  Coronavirus OC43 NOT DETECTED NOT DETECTED  Final   Metapneumovirus NOT DETECTED NOT DETECTED Final   Rhinovirus / Enterovirus NOT DETECTED NOT DETECTED Final   Influenza A NOT DETECTED NOT DETECTED Final   Influenza B NOT DETECTED NOT DETECTED Final   Parainfluenza Virus 1 NOT DETECTED NOT DETECTED Final   Parainfluenza Virus 2 NOT DETECTED NOT DETECTED Final   Parainfluenza Virus 3 NOT DETECTED NOT DETECTED Final   Parainfluenza Virus 4 NOT DETECTED NOT DETECTED Final   Respiratory Syncytial Virus NOT DETECTED NOT DETECTED Final   Bordetella pertussis NOT DETECTED NOT DETECTED Final   Bordetella Parapertussis NOT DETECTED NOT DETECTED Final   Chlamydophila pneumoniae NOT DETECTED NOT DETECTED Final   Mycoplasma pneumoniae NOT DETECTED NOT DETECTED Final    Comment: Performed at [facility], [place] [address], [place],  [address]    Radiology Studies: No results found.  Scheduled Meds:  atorvastatin  40 mg Oral Daily   enoxaparin (LOVENOX) injection  40 mg Subcutaneous Q24H   feeding supplement  237 mL Oral TID BM   fentaNYL  1 patch Transdermal Q72H   gabapentin  100 mg Oral TID   multivitamin with minerals  1 tablet Oral Daily   nicotine  21 mg Transdermal Daily   pantoprazole  40 mg Oral Daily   polyethylene glycol  17 g Oral Daily   potassium chloride SA  20 mEq Oral Daily   sotalol  80 mg Oral Daily   thiamine  100 mg Oral Daily   valACYclovir  1,000 mg Oral BID   Continuous Infusions:  0.9 % NaCl with KCl 20 mEq / L 100 mL/hr at 01/09/23 2129   cefTRIAXone (ROCEPHIN)  IV 2 g (01/10/23 0709)   potassium PHOSPHATE IVPB (in mmol)       LOS: 4 days    Time spent: 35 mins    Amaryah Mallen, MD [facility]   If 7PM-7AM, please contact night-coverage

## 2023-01-10 NOTE — Discharge Summary (Signed)
Physician Discharge Summary  Kyle Larson [id number] DOB: 27-Sep-1956 DOA: 01/06/2023  PCP: Kathrine Haddock, NP  Admit date: 01/06/2023  Discharge date: 01/10/2023  Admitted From:  Home Disposition:  AMA   Recommendations for Outpatient Follow-up:  Follow up with PCP in 1-2 weeks Please obtain BMP/CBC in one week Patient left against medical advice.  Home Health:None Equipment/Devices:None  Discharge Condition: Stable CODE STATUS:Full code Diet recommendation: Heart Healthy   Brief [facility] Course: This 67 yrs old male with medical history significant for rectal cancer on palliative chemo-radiotherapy, coronary artery disease, CVA, and tobacco abuse, who presented to the emergency room with acute onset of generalized weakness and generalized abdominal pain. Patient admitted to have fever and chills.  He has been having cough with inability to expectorate as well as dyspnea and wheezing.  Pertinent labs in the ED includes hyponatremia, hypokalemia, total bilirubin 5.9 , lactic acid 2.2, CBC shows leukopenia WBC 0.3,  anemia and thrombocytopenia.  Influenza, RSV, COVID PCR negative.  CT abdomen and pelvis showed extensive bibasilar airway impaction and consolidation consistent with multifocal pneumonia, Patient admitted for further evaluation and started on empiric antibiotics, patient has made slight improvement.  Blood cultures grew Moraxella and urine cultures grew Klebsiella.  Sensitivities pending.  Patient wanted to be discharged.  Explained in detail that we are waiting for culture and sensitivity and he needs couple more days to treat pneumonia.  Patient did not agree and he left AGAINST MEDICAL ADVICE.   Discharge Diagnoses:  Principal Problem:   Sepsis ([place]) Active Problems:   Large bowel obstruction (HCC)   Adenocarcinoma of rectum (HCC)   Hypokalemia   Hyponatremia   BPH (benign prostatic hyperplasia)   Dyslipidemia   Pancytopenia (HCC)   Chemotherapy-induced  neutropenia (HCC)   Bacteremia due to Gram-negative bacteria   Multifocal pneumonia   Discharge Instructions Left AGAINST MEDICAL ADVICE.  No Known Allergies  Consultations: None.  Procedures/Studies: DG Abd 2 Views  Result Date: 01/07/2023 CLINICAL DATA:  Large bowel obstruction.  Abdominal pain. EXAM: ABDOMEN - 2 VIEW COMPARISON:  CT chest abdomen pelvis 01/06/2023 and chest radiograph 01/06/2023. FINDINGS: Right IJ power port tip is in the low SVC. Heart size stable. Mild basilar hazy opacification. Small bilateral pleural effusions. Old left rib fractures. Minimal gaseous cecal distension. Bowel gas pattern is otherwise unremarkable. IMPRESSION: 1. Bibasilar aspiration or pneumonia. 2. Small bilateral pleural effusions. 3. Mild cecal distension, otherwise normal bowel gas pattern. Electronically Signed   By: Lorin Picket M.D.   On: 01/07/2023 10:20   CT CHEST ABDOMEN PELVIS W CONTRAST  Result Date: 01/06/2023 CLINICAL DATA:  Sepsis EXAM: CT CHEST, ABDOMEN, AND PELVIS WITH CONTRAST TECHNIQUE: Multidetector CT imaging of the chest, abdomen and pelvis was performed following the standard protocol during bolus administration of intravenous contrast. RADIATION DOSE REDUCTION: This exam was performed according to the departmental dose-optimization program which includes automated exposure control, adjustment of the mA and/or kV according to patient size and/or use of iterative reconstruction technique. CONTRAST:  117m OMNIPAQUE IOHEXOL 300 MG/ML  SOLN COMPARISON:  10/10/2022 FINDINGS: CT CHEST FINDINGS Cardiovascular: Moderate multi-vessel coronary artery calcification. Global cardiac size within normal limits. No pericardial effusion. Central pulmonary arteries are of normal caliber. No intraluminal filling defect through the segmental level to suggest acute pulmonary embolism. Mild atherosclerotic calcification within the thoracic aorta. No aortic aneurysm. Right internal jugular chest port  tip noted within the superior vena cava. Mediastinum/Nodes: No pathologic thoracic adenopathy. Esophagus unremarkable. Visualized thyroid is unremarkable. Small hiatal  hernia. Lungs/Pleura: Moderate emphysema. Extensive bibasilar airway impaction and consolidation within the posterior basal lower lobes bilaterally, left greater than right in keeping with changes of aspiration or multifocal infection. Superimposed bronchial wall thickening in keeping with airway inflammation. No pneumothorax. No pleural effusion. Interval enlargement of a single pulmonary nodule along the left major fissure, axial image # 96/4, measuring 5 mm (previously measuring 3 mm), indeterminate. An isolated pulmonary metastasis is of low likelihood, but not completely excluded. Musculoskeletal: Multiple remote left rib fractures noted. No acute bone abnormality. No lytic or blastic bone lesion within the thorax. CT ABDOMEN PELVIS FINDINGS Hepatobiliary: Stable tiny hypodensity within the left hepatic lobe most in keeping with a tiny hepatic cyst. Liver otherwise unremarkable. Gallbladder unremarkable. No intra or extrahepatic biliary ductal dilation. Pancreas: Unremarkable. No pancreatic ductal dilatation or surrounding inflammatory changes. Spleen: Normal in size without focal abnormality. Adrenals/Urinary Tract: The adrenal glands are unremarkable. The kidneys are normal. The bladder is mildly distended, but is otherwise unremarkable. Stomach/Bowel: Centrally necrotic rectal mass is again identified now with interstitial gas which may reflect changes of treatment effect or superimposed infection. The mass is again seen extending into the presacral space and abutting the left posterolateral pelvic sidewall in keeping with extensive local invasion. Lower quadrant diverting sigmoid loop colostomy is again identified with prominent stomal prolapse and partial large bowel obstruction with point of transition at the stomal hiatus at axial image #  106/2 with moderate retained stool proximally within the colon. The stomach, small bowel, and large bowel are otherwise unremarkable. No free intraperitoneal gas or fluid. Vascular/Lymphatic: Moderate aortoiliac atherosclerotic calcification. No aortic aneurysm. Previously noted mesorectal lymph node metastasis is again identified at axial image # 102/2 and is stable. No new pathologic adenopathy. Reproductive: Rectal mass abuts the left seminal vesicle, unchanged from prior examination and the intervening flat pain appears obliterated, unchanged suggesting possible invasion of the structure. The prostate gland is unremarkable. Other: None Musculoskeletal: No acute bone abnormality. No lytic or blastic bone lesion. IMPRESSION: 1. Extensive bibasilar airway impaction and consolidation within the posterior basal lower lobes bilaterally, left greater than right, in keeping with changes of aspiration or multifocal infection. Superimposed bronchial wall thickening in keeping with airway inflammation. 2. Moderate emphysema. 3. Moderate multi-vessel coronary artery calcification. 4. Centrally necrotic rectal mass again identified with extensive local invasion of the presacral space and left posterolateral pelvic sidewall. Stable mesorectal lymph node metastasis. Developing interstitial gas within the primary mass may reflect changes of treatment effect and increasing necrosis and/or superimposed infection. 5. Lower quadrant diverting sigmoid loop colostomy with prominent stomal prolapse and partial large bowel obstruction with point of transition at the stomal hiatus. Moderate retained stool proximally within the colon. 6. Interval enlargement of a single pulmonary nodule along the left major fissure, indeterminate. An isolated pulmonary metastasis is of low likelihood, but not completely excluded. Close attention on a follow-up surveillance imaging is warranted. Aortic Atherosclerosis (ICD10-I70.0) and Emphysema  (ICD10-J43.9). Electronically Signed   By: Fidela Salisbury M.D.   On: 01/06/2023 21:46   DG Chest Port 1 View  Result Date: 01/06/2023 CLINICAL DATA:  Possible sepsis. EXAM: PORTABLE CHEST 1 VIEW COMPARISON:  February 23, 2022 FINDINGS: Since the prior study there is been interval placement of a right-sided venous Port-A-Cath. Its distal tip is seen at the junction of the superior vena cava and right atrium. The heart size and mediastinal contours are within normal limits. Mild areas of atelectasis and/or infiltrate are seen within the bilateral lung  bases. There is no evidence of a pleural effusion or pneumothorax. Multiple chronic left-sided rib fractures are noted, with a radiopaque fixation plate and screws seen overlying the left acromion and distal left clavicle. IMPRESSION: Interval right-sided venous Port-A-Cath placement positioning, as described above, without evidence of pneumothorax. Electronically Signed   By: Virgina Norfolk M.D.   On: 01/06/2023 20:30     Subjective: Patient was seen and examined at bedside.  Explained to the patient in detail about risks of leaving [facility].  He understood the risk and left.  Discharge Exam: Vitals:   01/10/23 0530 01/10/23 0814  BP: 118/84 125/80  Pulse: 97 93  Resp: 16 16  Temp: 98.7 F (37.1 C) 98 F (36.7 C)  SpO2: 96% 95%   Vitals:   01/09/23 1643 01/09/23 2211 01/10/23 0530 01/10/23 0814  BP: 133/89 119/85 118/84 125/80  Pulse: 91 99 97 93  Resp: 18 16 16 16  $ Temp: 98 F (36.7 C) 98.1 F (36.7 C) 98.7 F (37.1 C) 98 F (36.7 C)  TempSrc:      SpO2: 97% 95% 96% 95%  Weight:      Height:        General: Pt is alert, awake, not in acute distress Cardiovascular: RRR, S1/S2 +, no rubs, no gallops Respiratory: CTA bilaterally, no wheezing, no rhonchi Abdominal: Soft, NT, ND, bowel sounds + Extremities: no edema, no cyanosis    The results of significant diagnostics from this hospitalization (including imaging,  microbiology, ancillary and laboratory) are listed below for reference.     Microbiology: Recent Results (from the past 240 hour(s))  Blood Culture (routine x 2)     Status: Abnormal   Collection Time: 01/06/23  7:34 PM   Specimen: BLOOD  Result Value Ref Range Status   Specimen Description BLOOD BLOOD RIGHT ARM  Final   Special Requests   Final    BOTTLES DRAWN AEROBIC AND ANAEROBIC Blood Culture results may not be optimal due to an inadequate volume of blood received in culture bottles   Culture  Setup Time   Final    GRAM NEGATIVE COCCI AEROBIC BOTTLE ONLY CRITICAL RESULT CALLED TO, READ BACK BY AND VERIFIED WITH: KRISTIN MERRILL 01/07/23 1836 MU    Culture (A)  Final    MORAXELLA CATARRHALIS(BRANHAMELLA) BETA LACTAMASE POSITIVE    Report Status 01/09/2023 FINAL  Final  Blood Culture (routine x 2)     Status: None (Preliminary result)   Collection Time: 01/06/23  7:34 PM   Specimen: BLOOD  Result Value Ref Range Status   Specimen Description BLOOD BLOOD LEFT ARM  Final   Special Requests   Final    BOTTLES DRAWN AEROBIC AND ANAEROBIC Blood Culture adequate volume   Culture   Final    NO GROWTH 4 DAYS Performed at [facility], [address] [place], [place] [address]    Report Status PENDING  Incomplete  Resp panel by RT-PCR (RSV, Flu A&B, Covid) Anterior Nasal Swab     Status: None   Collection Time: 01/06/23  7:34 PM   Specimen: Anterior Nasal Swab  Result Value Ref Range Status   SARS Coronavirus 2 by RT PCR NEGATIVE NEGATIVE Final    Comment: (NOTE) SARS-CoV-2 target nucleic acids are NOT DETECTED.  The SARS-CoV-2 RNA is generally detectable in upper respiratory specimens during the acute phase of infection. The lowest concentration of SARS-CoV-2 viral copies this assay can detect is 138 copies/mL. A negative result does not preclude  SARS-Cov-2 infection and should not be used as the sole basis for treatment or other patient management decisions. A  negative result may occur with  improper specimen collection/handling, submission of specimen other than nasopharyngeal swab, presence of viral mutation(s) within the areas targeted by this assay, and inadequate number of viral copies(<138 copies/mL). A negative result must be combined with clinical observations, patient history, and epidemiological information. The expected result is Negative.  Fact Sheet for Patients:  EntrepreneurPulse.com.au  Fact Sheet for Healthcare Providers:  IncredibleEmployment.be  This test is no t yet approved or cleared by the [place] FDA and  has been authorized for detection and/or diagnosis of SARS-CoV-2 by FDA under an Emergency Use Authorization (EUA). This EUA will remain  in effect (meaning this test can be used) for the duration of the COVID-19 declaration under Section 564(b)(1) of the Act, 21 U.S.C.section 360bbb-3(b)(1), unless the authorization is terminated  or revoked sooner.       Influenza A by PCR NEGATIVE NEGATIVE Final   Influenza B by PCR NEGATIVE NEGATIVE Final    Comment: (NOTE) The Xpert Xpress SARS-CoV-2/FLU/RSV plus assay is intended as an aid in the diagnosis of influenza from Nasopharyngeal swab specimens and should not be used as a sole basis for treatment. Nasal washings and aspirates are unacceptable for Xpert Xpress SARS-CoV-2/FLU/RSV testing.  Fact Sheet for Patients: EntrepreneurPulse.com.au  Fact Sheet for Healthcare Providers: IncredibleEmployment.be  This test is not yet approved or cleared by the [place] FDA and has been authorized for detection and/or diagnosis of SARS-CoV-2 by FDA under an Emergency Use Authorization (EUA). This EUA will remain in effect (meaning this test can be used) for the duration of the COVID-19 declaration under Section 564(b)(1) of the Act, 21 U.S.C. section 360bbb-3(b)(1), unless the authorization  is terminated or revoked.     Resp Syncytial Virus by PCR NEGATIVE NEGATIVE Final    Comment: (NOTE) Fact Sheet for Patients: EntrepreneurPulse.com.au  Fact Sheet for Healthcare Providers: IncredibleEmployment.be  This test is not yet approved or cleared by the [place] FDA and has been authorized for detection and/or diagnosis of SARS-CoV-2 by FDA under an Emergency Use Authorization (EUA). This EUA will remain in effect (meaning this test can be used) for the duration of the COVID-19 declaration under Section 564(b)(1) of the Act, 21 U.S.C. section 360bbb-3(b)(1), unless the authorization is terminated or revoked.  Performed at [facility], [address] [place], [place] [address]   Blood Culture ID Panel (Reflexed)     Status: None   Collection Time: 01/06/23  7:34 PM  Result Value Ref Range Status   Enterococcus faecalis NOT DETECTED NOT DETECTED Final   Enterococcus Faecium NOT DETECTED NOT DETECTED Final   Listeria monocytogenes NOT DETECTED NOT DETECTED Final   Staphylococcus species NOT DETECTED NOT DETECTED Final   Staphylococcus aureus (BCID) NOT DETECTED NOT DETECTED Final   Staphylococcus epidermidis NOT DETECTED NOT DETECTED Final   Staphylococcus lugdunensis NOT DETECTED NOT DETECTED Final   Streptococcus species NOT DETECTED NOT DETECTED Final   Streptococcus agalactiae NOT DETECTED NOT DETECTED Final   Streptococcus pneumoniae NOT DETECTED NOT DETECTED Final   Streptococcus pyogenes NOT DETECTED NOT DETECTED Final   A.calcoaceticus-baumannii NOT DETECTED NOT DETECTED Final   Bacteroides fragilis NOT DETECTED NOT DETECTED Final   Enterobacterales NOT DETECTED NOT DETECTED Final   Enterobacter cloacae complex NOT DETECTED NOT DETECTED Final   Escherichia coli NOT DETECTED NOT DETECTED Final   Klebsiella aerogenes NOT DETECTED NOT DETECTED  Final   Klebsiella oxytoca NOT DETECTED NOT DETECTED Final    Klebsiella pneumoniae NOT DETECTED NOT DETECTED Final   Proteus species NOT DETECTED NOT DETECTED Final   Salmonella species NOT DETECTED NOT DETECTED Final   Serratia marcescens NOT DETECTED NOT DETECTED Final   Haemophilus influenzae NOT DETECTED NOT DETECTED Final   Neisseria meningitidis NOT DETECTED NOT DETECTED Final   Pseudomonas aeruginosa NOT DETECTED NOT DETECTED Final   Stenotrophomonas maltophilia NOT DETECTED NOT DETECTED Final   Candida albicans NOT DETECTED NOT DETECTED Final   Candida auris NOT DETECTED NOT DETECTED Final   Candida glabrata NOT DETECTED NOT DETECTED Final   Candida krusei NOT DETECTED NOT DETECTED Final   Candida parapsilosis NOT DETECTED NOT DETECTED Final   Candida tropicalis NOT DETECTED NOT DETECTED Final   Cryptococcus neoformans/gattii NOT DETECTED NOT DETECTED Final    Comment: Performed at [facility], [address], [place], [place] [address]  Urine Culture     Status: Abnormal   Collection Time: 01/06/23  9:40 PM   Specimen: Urine, Random  Result Value Ref Range Status   Specimen Description   Final    URINE, RANDOM Performed at [facility], [address], [place], [place] [address]    Special Requests   Final    NONE Performed at [facility], [place], [place] [address]    Culture >=100,000 COLONIES/mL KLEBSIELLA PNEUMONIAE (A)  Final   Report Status 01/09/2023 FINAL  Final   Organism ID, Bacteria KLEBSIELLA PNEUMONIAE (A)  Final      Susceptibility   Klebsiella pneumoniae - MIC*    AMPICILLIN RESISTANT Resistant     CEFAZOLIN <=4 SENSITIVE Sensitive     CEFEPIME <=0.12 SENSITIVE Sensitive     CEFTRIAXONE <=0.25 SENSITIVE Sensitive     CIPROFLOXACIN <=0.25 SENSITIVE Sensitive     GENTAMICIN <=1 SENSITIVE Sensitive     IMIPENEM <=0.25 SENSITIVE Sensitive     NITROFURANTOIN <=16 SENSITIVE Sensitive     TRIMETH/SULFA <=20 SENSITIVE Sensitive     AMPICILLIN/SULBACTAM 4 SENSITIVE  Sensitive     PIP/TAZO <=4 SENSITIVE Sensitive     * >=100,000 COLONIES/mL KLEBSIELLA PNEUMONIAE  MRSA Next Gen by PCR, Nasal     Status: None   Collection Time: 01/08/23 11:07 AM   Specimen: Nasal Mucosa; Nasal Swab  Result Value Ref Range Status   MRSA by PCR Next Gen NOT DETECTED NOT DETECTED Final    Comment: (NOTE) The GeneXpert MRSA Assay (FDA approved for NASAL specimens only), is one component of a comprehensive MRSA colonization surveillance program. It is not intended to diagnose MRSA infection nor to guide or monitor treatment for MRSA infections. Test performance is not FDA approved in patients less than 24 years old. Performed at [facility], [place], [place] [address]   Respiratory (~20 pathogens) panel by PCR     Status: None   Collection Time: 01/08/23  1:56 PM   Specimen: Nasopharyngeal Swab; Respiratory  Result Value Ref Range Status   Adenovirus NOT DETECTED NOT DETECTED Final   Coronavirus 229E NOT DETECTED NOT DETECTED Final    Comment: (NOTE) The Coronavirus on the Respiratory Panel, DOES NOT test for the novel  Coronavirus (2019 nCoV)    Coronavirus HKU1 NOT DETECTED NOT DETECTED Final   Coronavirus NL63 NOT DETECTED NOT DETECTED Final   Coronavirus OC43 NOT DETECTED NOT DETECTED Final   Metapneumovirus NOT DETECTED NOT DETECTED Final   Rhinovirus / Enterovirus  NOT DETECTED NOT DETECTED Final   Influenza A NOT DETECTED NOT DETECTED Final   Influenza B NOT DETECTED NOT DETECTED Final   Parainfluenza Virus 1 NOT DETECTED NOT DETECTED Final   Parainfluenza Virus 2 NOT DETECTED NOT DETECTED Final   Parainfluenza Virus 3 NOT DETECTED NOT DETECTED Final   Parainfluenza Virus 4 NOT DETECTED NOT DETECTED Final   Respiratory Syncytial Virus NOT DETECTED NOT DETECTED Final   Bordetella pertussis NOT DETECTED NOT DETECTED Final   Bordetella Parapertussis NOT DETECTED NOT DETECTED Final   Chlamydophila pneumoniae NOT DETECTED NOT DETECTED  Final   Mycoplasma pneumoniae NOT DETECTED NOT DETECTED Final    Comment: Performed at [facility], [place] [address] [place], [place] [address]     Labs: BNP (last 3 results) No results for input(s): "BNP" in the last 8760 hours. Basic Metabolic Panel: Recent Labs  Lab 01/06/23 1934 01/07/23 0315 01/08/23 0339 01/09/23 0408 01/10/23 0525  NA 129* 130* 132* 134* 134*  K 3.2* 2.7* 3.1* 2.9* 3.1*  CL 92* 96* 104 107 107  CO2 25 25 21* 22 21*  GLUCOSE 88 78 167* 94 110*  BUN 16 16 20 18 18  $ CREATININE 0.64 0.59* 0.50* 0.45* 0.48*  CALCIUM 7.8* 7.6* 7.3* 7.1* 7.1*  MG  --  1.8 2.0 1.7 1.7  PHOS  --   --  <1.0* 1.3* 1.5*   Liver Function Tests: Recent Labs  Lab 01/06/23 1934  AST 29  ALT 20  ALKPHOS 96  BILITOT 2.1*  PROT 5.9*  ALBUMIN 2.6*   No results for input(s): "LIPASE", "AMYLASE" in the last 168 hours. No results for input(s): "AMMONIA" in the last 168 hours. CBC: Recent Labs  Lab 01/06/23 1934 01/07/23 0315 01/08/23 0339 01/09/23 0408 01/10/23 0525  WBC 0.3* 0.2* 0.4* 0.7* 2.2*  NEUTROABS 0.0*  --   --   --   --   HGB 12.8* 11.4* 11.6* 10.4* 10.1*  HCT 35.6* 33.0* 32.0* 30.0* 28.8*  MCV 90.6 93.8 89.4 91.7 92.0  PLT 61* 46* 64* 88* 141*   Cardiac Enzymes: No results for input(s): "CKTOTAL", "CKMB", "CKMBINDEX", "TROPONINI" in the last 168 hours. BNP: Invalid input(s): "POCBNP" CBG: No results for input(s): "GLUCAP" in the last 168 hours. D-Dimer No results for input(s): "DDIMER" in the last 72 hours. Hgb A1c No results for input(s): "HGBA1C" in the last 72 hours. Lipid Profile No results for input(s): "CHOL", "HDL", "LDLCALC", "TRIG", "CHOLHDL", "LDLDIRECT" in the last 72 hours. Thyroid function studies No results for input(s): "TSH", "T4TOTAL", "T3FREE", "THYROIDAB" in the last 72 hours.  Invalid input(s): "FREET3" Anemia work up No results for input(s): "VITAMINB12", "FOLATE", "FERRITIN", "TIBC", "IRON", "RETICCTPCT" in the last 72  hours. Urinalysis    Component Value Date/Time   COLORURINE YELLOW (A) 01/06/2023 2140   APPEARANCEUR HAZY (A) 01/06/2023 2140   APPEARANCEUR Cloudy (A) 09/25/2022 1448   LABSPEC 1.017 01/06/2023 2140   PHURINE 7.0 01/06/2023 2140   GLUCOSEU NEGATIVE 01/06/2023 2140   HGBUR SMALL (A) 01/06/2023 2140   BILIRUBINUR NEGATIVE 01/06/2023 2140   BILIRUBINUR Negative 09/25/2022 [place] 01/06/2023 2140   PROTEINUR 30 (A) 01/06/2023 2140   NITRITE NEGATIVE 01/06/2023 2140   LEUKOCYTESUR NEGATIVE 01/06/2023 2140   Sepsis Labs Recent Labs  Lab 01/07/23 0315 01/08/23 0339 01/09/23 0408 01/10/23 0525  WBC 0.2* 0.4* 0.7* 2.2*   Microbiology Recent Results (from the past 240 hour(s))  Blood Culture (routine x 2)     Status:  Abnormal   Collection Time: 01/06/23  7:34 PM   Specimen: BLOOD  Result Value Ref Range Status   Specimen Description BLOOD BLOOD RIGHT ARM  Final   Special Requests   Final    BOTTLES DRAWN AEROBIC AND ANAEROBIC Blood Culture results may not be optimal due to an inadequate volume of blood received in culture bottles   Culture  Setup Time   Final    GRAM NEGATIVE COCCI AEROBIC BOTTLE ONLY CRITICAL RESULT CALLED TO, READ BACK BY AND VERIFIED WITH: KRISTIN MERRILL 01/07/23 1836 MU    Culture (A)  Final    MORAXELLA CATARRHALIS(BRANHAMELLA) BETA LACTAMASE POSITIVE    Report Status 01/09/2023 FINAL  Final  Blood Culture (routine x 2)     Status: None (Preliminary result)   Collection Time: 01/06/23  7:34 PM   Specimen: BLOOD  Result Value Ref Range Status   Specimen Description BLOOD BLOOD LEFT ARM  Final   Special Requests   Final    BOTTLES DRAWN AEROBIC AND ANAEROBIC Blood Culture adequate volume   Culture   Final    NO GROWTH 4 DAYS Performed at [facility], [address] [place], [place] [address]    Report Status PENDING  Incomplete  Resp panel by RT-PCR (RSV, Flu A&B, Covid) Anterior Nasal Swab     Status: None    Collection Time: 01/06/23  7:34 PM   Specimen: Anterior Nasal Swab  Result Value Ref Range Status   SARS Coronavirus 2 by RT PCR NEGATIVE NEGATIVE Final    Comment: (NOTE) SARS-CoV-2 target nucleic acids are NOT DETECTED.  The SARS-CoV-2 RNA is generally detectable in upper respiratory specimens during the acute phase of infection. The lowest concentration of SARS-CoV-2 viral copies this assay can detect is 138 copies/mL. A negative result does not preclude SARS-Cov-2 infection and should not be used as the sole basis for treatment or other patient management decisions. A negative result may occur with  improper specimen collection/handling, submission of specimen other than nasopharyngeal swab, presence of viral mutation(s) within the areas targeted by this assay, and inadequate number of viral copies(<138 copies/mL). A negative result must be combined with clinical observations, patient history, and epidemiological information. The expected result is Negative.  Fact Sheet for Patients:  EntrepreneurPulse.com.au  Fact Sheet for Healthcare Providers:  IncredibleEmployment.be  This test is no t yet approved or cleared by the [place] FDA and  has been authorized for detection and/or diagnosis of SARS-CoV-2 by FDA under an Emergency Use Authorization (EUA). This EUA will remain  in effect (meaning this test can be used) for the duration of the COVID-19 declaration under Section 564(b)(1) of the Act, 21 U.S.C.section 360bbb-3(b)(1), unless the authorization is terminated  or revoked sooner.       Influenza A by PCR NEGATIVE NEGATIVE Final   Influenza B by PCR NEGATIVE NEGATIVE Final    Comment: (NOTE) The Xpert Xpress SARS-CoV-2/FLU/RSV plus assay is intended as an aid in the diagnosis of influenza from Nasopharyngeal swab specimens and should not be used as a sole basis for treatment. Nasal washings and aspirates are unacceptable for  Xpert Xpress SARS-CoV-2/FLU/RSV testing.  Fact Sheet for Patients: EntrepreneurPulse.com.au  Fact Sheet for Healthcare Providers: IncredibleEmployment.be  This test is not yet approved or cleared by the [place] FDA and has been authorized for detection and/or diagnosis of SARS-CoV-2 by FDA under an Emergency Use Authorization (EUA). This EUA will remain in effect (meaning this test can be used) for the  duration of the COVID-19 declaration under Section 564(b)(1) of the Act, 21 U.S.C. section 360bbb-3(b)(1), unless the authorization is terminated or revoked.     Resp Syncytial Virus by PCR NEGATIVE NEGATIVE Final    Comment: (NOTE) Fact Sheet for Patients: EntrepreneurPulse.com.au  Fact Sheet for Healthcare Providers: IncredibleEmployment.be  This test is not yet approved or cleared by the [place] FDA and has been authorized for detection and/or diagnosis of SARS-CoV-2 by FDA under an Emergency Use Authorization (EUA). This EUA will remain in effect (meaning this test can be used) for the duration of the COVID-19 declaration under Section 564(b)(1) of the Act, 21 U.S.C. section 360bbb-3(b)(1), unless the authorization is terminated or revoked.  Performed at [facility], [address] [place], [place] [address]   Blood Culture ID Panel (Reflexed)     Status: None   Collection Time: 01/06/23  7:34 PM  Result Value Ref Range Status   Enterococcus faecalis NOT DETECTED NOT DETECTED Final   Enterococcus Faecium NOT DETECTED NOT DETECTED Final   Listeria monocytogenes NOT DETECTED NOT DETECTED Final   Staphylococcus species NOT DETECTED NOT DETECTED Final   Staphylococcus aureus (BCID) NOT DETECTED NOT DETECTED Final   Staphylococcus epidermidis NOT DETECTED NOT DETECTED Final   Staphylococcus lugdunensis NOT DETECTED NOT DETECTED Final   Streptococcus species NOT DETECTED NOT  DETECTED Final   Streptococcus agalactiae NOT DETECTED NOT DETECTED Final   Streptococcus pneumoniae NOT DETECTED NOT DETECTED Final   Streptococcus pyogenes NOT DETECTED NOT DETECTED Final   A.calcoaceticus-baumannii NOT DETECTED NOT DETECTED Final   Bacteroides fragilis NOT DETECTED NOT DETECTED Final   Enterobacterales NOT DETECTED NOT DETECTED Final   Enterobacter cloacae complex NOT DETECTED NOT DETECTED Final   Escherichia coli NOT DETECTED NOT DETECTED Final   Klebsiella aerogenes NOT DETECTED NOT DETECTED Final   Klebsiella oxytoca NOT DETECTED NOT DETECTED Final   Klebsiella pneumoniae NOT DETECTED NOT DETECTED Final   Proteus species NOT DETECTED NOT DETECTED Final   Salmonella species NOT DETECTED NOT DETECTED Final   Serratia marcescens NOT DETECTED NOT DETECTED Final   Haemophilus influenzae NOT DETECTED NOT DETECTED Final   Neisseria meningitidis NOT DETECTED NOT DETECTED Final   Pseudomonas aeruginosa NOT DETECTED NOT DETECTED Final   Stenotrophomonas maltophilia NOT DETECTED NOT DETECTED Final   Candida albicans NOT DETECTED NOT DETECTED Final   Candida auris NOT DETECTED NOT DETECTED Final   Candida glabrata NOT DETECTED NOT DETECTED Final   Candida krusei NOT DETECTED NOT DETECTED Final   Candida parapsilosis NOT DETECTED NOT DETECTED Final   Candida tropicalis NOT DETECTED NOT DETECTED Final   Cryptococcus neoformans/gattii NOT DETECTED NOT DETECTED Final    Comment: Performed at [facility], [address], [place], [place] [address]  Urine Culture     Status: Abnormal   Collection Time: 01/06/23  9:40 PM   Specimen: Urine, Random  Result Value Ref Range Status   Specimen Description   Final    URINE, RANDOM Performed at [facility], [address], [place], [place] [address]    Special Requests   Final    NONE Performed at [facility], [address] [place], [place] [address]    Culture >=100,000 COLONIES/mL KLEBSIELLA  PNEUMONIAE (A)  Final   Report Status 01/09/2023 FINAL  Final   Organism ID, Bacteria KLEBSIELLA PNEUMONIAE (A)  Final      Susceptibility   Klebsiella pneumoniae - MIC*    AMPICILLIN RESISTANT Resistant  CEFAZOLIN <=4 SENSITIVE Sensitive     CEFEPIME <=0.12 SENSITIVE Sensitive     CEFTRIAXONE <=0.25 SENSITIVE Sensitive     CIPROFLOXACIN <=0.25 SENSITIVE Sensitive     GENTAMICIN <=1 SENSITIVE Sensitive     IMIPENEM <=0.25 SENSITIVE Sensitive     NITROFURANTOIN <=16 SENSITIVE Sensitive     TRIMETH/SULFA <=20 SENSITIVE Sensitive     AMPICILLIN/SULBACTAM 4 SENSITIVE Sensitive     PIP/TAZO <=4 SENSITIVE Sensitive     * >=100,000 COLONIES/mL KLEBSIELLA PNEUMONIAE  MRSA Next Gen by PCR, Nasal     Status: None   Collection Time: 01/08/23 11:07 AM   Specimen: Nasal Mucosa; Nasal Swab  Result Value Ref Range Status   MRSA by PCR Next Gen NOT DETECTED NOT DETECTED Final    Comment: (NOTE) The GeneXpert MRSA Assay (FDA approved for NASAL specimens only), is one component of a comprehensive MRSA colonization surveillance program. It is not intended to diagnose MRSA infection nor to guide or monitor treatment for MRSA infections. Test performance is not FDA approved in patients less than 71 years old. Performed at [facility], [place], [place] [address]   Respiratory (~20 pathogens) panel by PCR     Status: None   Collection Time: 01/08/23  1:56 PM   Specimen: Nasopharyngeal Swab; Respiratory  Result Value Ref Range Status   Adenovirus NOT DETECTED NOT DETECTED Final   Coronavirus 229E NOT DETECTED NOT DETECTED Final    Comment: (NOTE) The Coronavirus on the Respiratory Panel, DOES NOT test for the novel  Coronavirus (2019 nCoV)    Coronavirus HKU1 NOT DETECTED NOT DETECTED Final   Coronavirus NL63 NOT DETECTED NOT DETECTED Final   Coronavirus OC43 NOT DETECTED NOT DETECTED Final   Metapneumovirus NOT DETECTED NOT DETECTED Final   Rhinovirus / Enterovirus  NOT DETECTED NOT DETECTED Final   Influenza A NOT DETECTED NOT DETECTED Final   Influenza B NOT DETECTED NOT DETECTED Final   Parainfluenza Virus 1 NOT DETECTED NOT DETECTED Final   Parainfluenza Virus 2 NOT DETECTED NOT DETECTED Final   Parainfluenza Virus 3 NOT DETECTED NOT DETECTED Final   Parainfluenza Virus 4 NOT DETECTED NOT DETECTED Final   Respiratory Syncytial Virus NOT DETECTED NOT DETECTED Final   Bordetella pertussis NOT DETECTED NOT DETECTED Final   Bordetella Parapertussis NOT DETECTED NOT DETECTED Final   Chlamydophila pneumoniae NOT DETECTED NOT DETECTED Final   Mycoplasma pneumoniae NOT DETECTED NOT DETECTED Final    Comment: Performed at [facility], [place], [place], [place] [address]     Time coordinating discharge: Over 30 minutes  SIGNED:   Shawna Clamp, MD  [facility] 01/10/2023, 3:03 PM Pager   If 7PM-7AM, please contact night-coverage

## 2023-01-10 NOTE — Progress Notes (Signed)
Pt want to go home.  Pt is alert and oriented . educated by MD and nurse that he is not medically cleared, keep saying he going home today and left AMA.

## 2023-01-11 LAB — CULTURE, BLOOD (ROUTINE X 2)
Culture: NO GROWTH
Special Requests: ADEQUATE

## 2023-01-13 MED FILL — Dexamethasone Sodium Phosphate Inj 100 MG/10ML: INTRAMUSCULAR | Qty: 1 | Status: AC

## 2023-01-14 ENCOUNTER — Encounter: Payer: Self-pay | Admitting: Oncology

## 2023-01-14 ENCOUNTER — Inpatient Hospital Stay: Payer: Medicare Other

## 2023-01-14 ENCOUNTER — Other Ambulatory Visit: Payer: Self-pay

## 2023-01-14 ENCOUNTER — Inpatient Hospital Stay (HOSPITAL_BASED_OUTPATIENT_CLINIC_OR_DEPARTMENT_OTHER): Payer: Medicare Other | Admitting: Oncology

## 2023-01-14 VITALS — BP 134/88 | HR 73 | Temp 97.9°F | Resp 16 | Ht 72.0 in | Wt 140.5 lb

## 2023-01-14 DIAGNOSIS — C20 Malignant neoplasm of rectum: Secondary | ICD-10-CM

## 2023-01-14 DIAGNOSIS — Z5111 Encounter for antineoplastic chemotherapy: Secondary | ICD-10-CM | POA: Diagnosis not present

## 2023-01-14 DIAGNOSIS — E876 Hypokalemia: Secondary | ICD-10-CM

## 2023-01-14 DIAGNOSIS — Z79899 Other long term (current) drug therapy: Secondary | ICD-10-CM | POA: Diagnosis not present

## 2023-01-14 DIAGNOSIS — Z95828 Presence of other vascular implants and grafts: Secondary | ICD-10-CM

## 2023-01-14 LAB — CBC WITH DIFFERENTIAL/PLATELET
Abs Immature Granulocytes: 0.4 10*3/uL — ABNORMAL HIGH (ref 0.00–0.07)
Basophils Absolute: 0 10*3/uL (ref 0.0–0.1)
Basophils Relative: 1 %
Eosinophils Absolute: 0 10*3/uL (ref 0.0–0.5)
Eosinophils Relative: 0 %
HCT: 30.7 % — ABNORMAL LOW (ref 39.0–52.0)
Hemoglobin: 10.6 g/dL — ABNORMAL LOW (ref 13.0–17.0)
Immature Granulocytes: 7 %
Lymphocytes Relative: 13 %
Lymphs Abs: 0.7 10*3/uL (ref 0.7–4.0)
MCH: 32.4 pg (ref 26.0–34.0)
MCHC: 34.5 g/dL (ref 30.0–36.0)
MCV: 93.9 fL (ref 80.0–100.0)
Monocytes Absolute: 0.6 10*3/uL (ref 0.1–1.0)
Monocytes Relative: 10 %
Neutro Abs: 4 10*3/uL (ref 1.7–7.7)
Neutrophils Relative %: 69 %
Platelets: 337 10*3/uL (ref 150–400)
RBC: 3.27 MIL/uL — ABNORMAL LOW (ref 4.22–5.81)
RDW: 19.5 % — ABNORMAL HIGH (ref 11.5–15.5)
Smear Review: NORMAL
WBC: 5.8 10*3/uL (ref 4.0–10.5)
nRBC: 1 % — ABNORMAL HIGH (ref 0.0–0.2)

## 2023-01-14 LAB — URINALYSIS, COMPLETE (UACMP) WITH MICROSCOPIC
Bacteria, UA: NONE SEEN
Bilirubin Urine: NEGATIVE
Glucose, UA: NEGATIVE mg/dL
Ketones, ur: NEGATIVE mg/dL
Leukocytes,Ua: NEGATIVE
Nitrite: NEGATIVE
Protein, ur: NEGATIVE mg/dL
Specific Gravity, Urine: 1.017 (ref 1.005–1.030)
Squamous Epithelial / HPF: NONE SEEN /HPF (ref 0–5)
pH: 6 (ref 5.0–8.0)

## 2023-01-14 LAB — COMPREHENSIVE METABOLIC PANEL
ALT: 49 U/L — ABNORMAL HIGH (ref 0–44)
AST: 49 U/L — ABNORMAL HIGH (ref 15–41)
Albumin: 2.3 g/dL — ABNORMAL LOW (ref 3.5–5.0)
Alkaline Phosphatase: 100 U/L (ref 38–126)
Anion gap: 8 (ref 5–15)
BUN: 16 mg/dL (ref 8–23)
CO2: 27 mmol/L (ref 22–32)
Calcium: 7.9 mg/dL — ABNORMAL LOW (ref 8.9–10.3)
Chloride: 101 mmol/L (ref 98–111)
Creatinine, Ser: 0.59 mg/dL — ABNORMAL LOW (ref 0.61–1.24)
GFR, Estimated: >60 mL/min (ref >60)
Glucose, Bld: 84 mg/dL (ref 70–99)
Potassium: 2.6 mmol/L — CL (ref 3.5–5.1)
Sodium: 136 mmol/L (ref 135–145)
Total Bilirubin: 0.7 mg/dL (ref 0.3–1.2)
Total Protein: 5.1 g/dL — ABNORMAL LOW (ref 6.5–8.1)

## 2023-01-14 LAB — MAGNESIUM: Magnesium: 2 mg/dL (ref 1.7–2.4)

## 2023-01-14 MED ORDER — SODIUM CHLORIDE 0.9 % IV SOLN: 40.0000 meq | Freq: Once | INTRAVENOUS | Status: DC

## 2023-01-14 MED ORDER — POTASSIUM CHLORIDE 20 MEQ/100ML IV SOLN
20.0000 meq | Freq: Once | INTRAVENOUS | Status: AC
  Administered 2023-01-14: 20 meq via INTRAVENOUS

## 2023-01-14 MED ORDER — SODIUM CHLORIDE 0.9 % IV SOLN
Freq: Once | INTRAVENOUS | Status: AC
  Filled 2023-01-14: qty 250

## 2023-01-14 MED ORDER — HEPARIN SOD (PORK) LOCK FLUSH 100 UNIT/ML IV SOLN
500.0000 [IU] | Freq: Once | INTRAVENOUS | Status: AC
  Administered 2023-01-14: 500 [IU] via INTRAVENOUS
  Filled 2023-01-14: qty 5

## 2023-01-14 NOTE — Progress Notes (Signed)
. [place]  [phone] [phone]  ID: Kyle Larson OB: 02/08/1956  MR#: [id number]  [id number]  Patient Care Team: Kathrine Haddock, NP as PCP - General (Nurse Practitioner) Jules Husbands, MD as Consulting Physician (General Surgery) Clent Jacks, RN as Oncology Nurse Navigator Grayland Ormond, Kathlene November, MD as Consulting Physician (Oncology) [name], MD as Consulting Physician (Cardiology) Michael Boston, MD as Consulting Physician (Colon and Rectal Surgery) Lesly Rubenstein, MD as Consulting Physician (Gastroenterology) Billey Co, MD as Consulting Physician (Urology)  CHIEF COMPLAINT: Progressive adenocarcinoma of the rectum.  INTERVAL HISTORY: Patient returns to clinic today for hospital follow-up and further evaluation.  He was recently admitted with neutropenic fever and found to have a UTI as well as possible multifocal pneumonia.  Patient ultimately left the hospital AMA.  He continues to have significant weakness and fatigue.  He has a poor appetite. He has no neurologic complaints. He has no chest pain, shortness of breath, or hemoptysis, but continues to have cough.  He has no nausea, vomiting, constipation, or diarrhea.  He does not report any melena or hematochezia.  He has no urinary complaints.  Patient feels to neurally terrible, but offers no further specific complaints today.  REVIEW OF SYSTEMS:   Review of Systems  Constitutional:  Positive for malaise/fatigue. Negative for fever and weight loss.  Respiratory:  Positive for cough. Negative for hemoptysis and shortness of breath.   Cardiovascular: Negative.  Negative for chest pain and leg swelling.  Gastrointestinal: Negative.  Negative for abdominal pain, blood in stool, constipation, diarrhea and melena.  Genitourinary: Negative.  Negative for dysuria.  Musculoskeletal: Negative.  Negative for back pain.  Skin: Negative.  Negative for rash.   Neurological:  Positive for weakness. Negative for dizziness, speech change, focal weakness and headaches.  Psychiatric/Behavioral: Negative.  The patient is not nervous/anxious and does not have insomnia.     As per HPI. Otherwise, a complete review of systems is negative.  PAST MEDICAL HISTORY: Past Medical History:  Diagnosis Date   AC joint dislocation    CAD (coronary artery disease)    CVA (cerebral vascular accident) ([place])    Metatarsal bone fracture    3rd metatarsal , left foot; April 2021   Syncope    June '22   Tobacco abuse     PAST SURGICAL HISTORY: Past Surgical History:  Procedure Laterality Date   ACROMIO-CLAVICULAR JOINT REPAIR Left 09/11/2016   Procedure: ACROMIO-CLAVICULAR RECONSTRUCTION;  Surgeon: Corky Mull, MD;  Location: [facility];  Service: Orthopedics;  Laterality: Left;  clavicle   FLEXIBLE SIGMOIDOSCOPY N/A 02/24/2022   Procedure: FLEXIBLE SIGMOIDOSCOPY;  Surgeon: Lesly Rubenstein, MD;  Location: [facility];  Service: Endoscopy;  Laterality: N/A;   IR IMAGING GUIDED PORT INSERTION  03/10/2022   LEFT HEART CATH AND CORONARY ANGIOGRAPHY N/A 05/20/2021   Procedure: LEFT HEART CATH AND CORONARY ANGIOGRAPHY with coronary intervention;  Surgeon: [name], MD;  Location: [facility];  Service: Cardiovascular;  Laterality: N/A;   MANDIBLE RECONSTRUCTION     TONSILLECTOMY     AGE 67   XI ROBOTIC ASSISTED INGUINAL HERNIA REPAIR WITH MESH Right 03/26/2020   Procedure: XI ROBOTIC ASSISTED INGUINAL HERNIA REPAIR WITH MESH;  Surgeon: Herbert Pun, MD;  Location: [facility];  Service: General;  Laterality: Right;    FAMILY HISTORY: Family History  Problem Relation Age of Onset   Diabetes Mother    Prostate cancer Father  ADVANCED DIRECTIVES (Y/N):  N  HEALTH MAINTENANCE: Social History   Tobacco Use   Smoking status: Some Days    Packs/day: 0.25    Years: 15.00    Total pack years: 3.75    Types: Cigarettes    Passive  exposure: Current   Smokeless tobacco: Never  Vaping Use   Vaping Use: Never used  Substance Use Topics   Alcohol use: Yes    Alcohol/week: 42.0 standard drinks of alcohol    Types: 42 Cans of beer per week    Comment: 3 every other day   Drug use: No     Colonoscopy:  PAP:  Bone density:  Lipid panel:  No Known Allergies  Current Outpatient Medications  Medication Sig Dispense Refill   ALPRAZolam (XANAX) 0.25 MG tablet Take 1 tablet (0.25 mg total) by mouth at bedtime as needed for sleep. 30 tablet 0   dexamethasone (DECADRON) 2 MG tablet Take 1 tablet (2 mg total) by mouth 2 (two) times daily as needed. for appetite and nausea. 60 tablet 2   fentaNYL (DURAGESIC) 50 MCG/HR Place 1 patch onto the skin every 3 (three) days. 10 patch 0   gabapentin (NEURONTIN) 100 MG capsule Take 1 capsule (100 mg total) by mouth 3 (three) times daily. 90 capsule 2   lidocaine-prilocaine (EMLA) cream Apply 1 application  topically as needed. Apply cream to port 1 hour prior to chemotherapy. Cover with plastic wrap. 30 g 3   lidocaine-prilocaine (EMLA) cream Apply to affected area once 30 g 3   Multiple Vitamin (MULTI-VITAMIN) tablet Take by mouth.     Multiple Vitamins-Minerals (MULTIVITAMIN WITH MINERALS) tablet Take 1 tablet by mouth daily.     Oxycodone HCl 10 MG TABS Take 1 tablet (10 mg total) by mouth every 6 (six) hours as needed. Okay to take 1/2 tablet as needed. 45 tablet 0   pantoprazole (PROTONIX) 40 MG tablet Take 1 tablet (40 mg total) by mouth daily. 30 tablet 2   potassium chloride SA (KLOR-CON M) 20 MEQ tablet TAKE 1 TABLET(20 MEQ) BY MOUTH DAILY 90 tablet 0   prochlorperazine (COMPAZINE) 10 MG tablet Take 1 tablet (10 mg total) by mouth every 6 (six) hours as needed (Nausea or vomiting). 60 tablet 2   sildenafil (VIAGRA) 25 MG tablet Take 1 tablet (25 mg total) by mouth daily as needed for erectile dysfunction. 10 tablet 0   sotalol (BETAPACE) 80 MG tablet Take 80 mg by mouth daily.      tamsulosin (FLOMAX) 0.4 MG CAPS capsule Take 2 capsules (0.8 mg total) by mouth daily. Take one in the morning and one at night. 60 capsule 11   thiamine 100 MG tablet Take 1 tablet (100 mg total) by mouth daily. 30 tablet 0   acetaminophen (TYLENOL) 325 MG tablet Take 2 tablets (650 mg total) by mouth every 6 (six) hours as needed for mild pain.     atorvastatin (LIPITOR) 40 MG tablet Take 1 tablet (40 mg total) by mouth daily. 30 tablet 0   feeding supplement (ENSURE ENLIVE / ENSURE PLUS) LIQD Take 237 mLs by mouth 3 (three) times daily between meals. (Patient not taking: Reported on 01/14/2023) 21330 mL 0   loperamide (IMODIUM A-D) 2 MG tablet Take 2 mg by mouth 4 (four) times daily as needed for diarrhea or loose stools. (Patient not taking: Reported on 11/06/2022)     loperamide (IMODIUM) 2 MG capsule Take 2 tabs by mouth with first loose stool, then 1  tab with each additional loose stool as needed. Do not exceed 8 tabs in a 24-hour period (Patient not taking: Reported on 01/14/2023) 60 capsule 2   nicotine (NICODERM CQ - DOSED IN MG/24 HOURS) 21 mg/24hr patch Place 21 mg onto the skin daily. (Patient not taking: Reported on 11/06/2022)     ondansetron (ZOFRAN) 8 MG tablet TAKE 1 TABLET 2 TIMES DAILY AS NEEDED FOR REFRACTORY NAUSEA / VOMITING. START DAY 3 AFTER CHEMO (Patient not taking: Reported on 01/14/2023) 60 tablet 2   polyethylene glycol (MIRALAX / GLYCOLAX) 17 g packet Take 17 g by mouth daily. (Patient not taking: Reported on 01/14/2023)     No current facility-administered medications for this visit.    OBJECTIVE: Vitals:   01/14/23 0838  BP: 134/88  Pulse: 73  Resp: 16  Temp: 97.9 F (36.6 C)  SpO2: 98%      Body mass index is 19.06 kg/m.    ECOG FS:2 - Symptomatic, <50% confined to bed  General: Ill-appearing, no acute distress. Eyes: Pink conjunctiva, anicteric sclera. HEENT: Normocephalic, moist mucous membranes. Lungs: No audible wheezing or coughing. Heart: Regular  rate and rhythm. Abdomen: Soft, nontender, no obvious distention. Musculoskeletal: No edema, cyanosis, or clubbing. Neuro: Alert, answering all questions appropriately. Cranial nerves grossly intact. Skin: No rashes or petechiae noted. Psych: Normal affect.   LAB RESULTS:  Lab Results  Component Value Date   NA 136 01/14/2023   K 2.6 (LL) 01/14/2023   CL 101 01/14/2023   CO2 27 01/14/2023   GLUCOSE 84 01/14/2023   BUN 16 01/14/2023   CREATININE 0.59 (L) 01/14/2023   CALCIUM 7.9 (L) 01/14/2023   PROT 5.1 (L) 01/14/2023   ALBUMIN 2.3 (L) 01/14/2023   AST 49 (H) 01/14/2023   ALT 49 (H) 01/14/2023   ALKPHOS 100 01/14/2023   BILITOT 0.7 01/14/2023   GFRNONAA >60 01/14/2023   GFRAA >60 10/03/2016    Lab Results  Component Value Date   WBC 5.8 01/14/2023   NEUTROABS 4.0 01/14/2023   HGB 10.6 (L) 01/14/2023   HCT 30.7 (L) 01/14/2023   MCV 93.9 01/14/2023   PLT 337 01/14/2023     STUDIES: DG Abd 2 Views  Result Date: 01/07/2023 CLINICAL DATA:  Large bowel obstruction.  Abdominal pain. EXAM: ABDOMEN - 2 VIEW COMPARISON:  CT chest abdomen pelvis 01/06/2023 and chest radiograph 01/06/2023. FINDINGS: Right IJ power port tip is in the low SVC. Heart size stable. Mild basilar hazy opacification. Small bilateral pleural effusions. Old left rib fractures. Minimal gaseous cecal distension. Bowel gas pattern is otherwise unremarkable. IMPRESSION: 1. Bibasilar aspiration or pneumonia. 2. Small bilateral pleural effusions. 3. Mild cecal distension, otherwise normal bowel gas pattern. Electronically Signed   By: Lorin Picket M.D.   On: 01/07/2023 10:20   CT CHEST ABDOMEN PELVIS W CONTRAST  Result Date: 01/06/2023 CLINICAL DATA:  Sepsis EXAM: CT CHEST, ABDOMEN, AND PELVIS WITH CONTRAST TECHNIQUE: Multidetector CT imaging of the chest, abdomen and pelvis was performed following the standard protocol during bolus administration of intravenous contrast. RADIATION DOSE REDUCTION: This exam  was performed according to the departmental dose-optimization program which includes automated exposure control, adjustment of the mA and/or kV according to patient size and/or use of iterative reconstruction technique. CONTRAST:  163m OMNIPAQUE IOHEXOL 300 MG/ML  SOLN COMPARISON:  10/10/2022 FINDINGS: CT CHEST FINDINGS Cardiovascular: Moderate multi-vessel coronary artery calcification. Global cardiac size within normal limits. No pericardial effusion. Central pulmonary arteries are of normal caliber. No intraluminal filling defect through  the segmental level to suggest acute pulmonary embolism. Mild atherosclerotic calcification within the thoracic aorta. No aortic aneurysm. Right internal jugular chest port tip noted within the superior vena cava. Mediastinum/Nodes: No pathologic thoracic adenopathy. Esophagus unremarkable. Visualized thyroid is unremarkable. Small hiatal hernia. Lungs/Pleura: Moderate emphysema. Extensive bibasilar airway impaction and consolidation within the posterior basal lower lobes bilaterally, left greater than right in keeping with changes of aspiration or multifocal infection. Superimposed bronchial wall thickening in keeping with airway inflammation. No pneumothorax. No pleural effusion. Interval enlargement of a single pulmonary nodule along the left major fissure, axial image # 96/4, measuring 5 mm (previously measuring 3 mm), indeterminate. An isolated pulmonary metastasis is of low likelihood, but not completely excluded. Musculoskeletal: Multiple remote left rib fractures noted. No acute bone abnormality. No lytic or blastic bone lesion within the thorax. CT ABDOMEN PELVIS FINDINGS Hepatobiliary: Stable tiny hypodensity within the left hepatic lobe most in keeping with a tiny hepatic cyst. Liver otherwise unremarkable. Gallbladder unremarkable. No intra or extrahepatic biliary ductal dilation. Pancreas: Unremarkable. No pancreatic ductal dilatation or surrounding inflammatory  changes. Spleen: Normal in size without focal abnormality. Adrenals/Urinary Tract: The adrenal glands are unremarkable. The kidneys are normal. The bladder is mildly distended, but is otherwise unremarkable. Stomach/Bowel: Centrally necrotic rectal mass is again identified now with interstitial gas which may reflect changes of treatment effect or superimposed infection. The mass is again seen extending into the presacral space and abutting the left posterolateral pelvic sidewall in keeping with extensive local invasion. Lower quadrant diverting sigmoid loop colostomy is again identified with prominent stomal prolapse and partial large bowel obstruction with point of transition at the stomal hiatus at axial image # 106/2 with moderate retained stool proximally within the colon. The stomach, small bowel, and large bowel are otherwise unremarkable. No free intraperitoneal gas or fluid. Vascular/Lymphatic: Moderate aortoiliac atherosclerotic calcification. No aortic aneurysm. Previously noted mesorectal lymph node metastasis is again identified at axial image # 102/2 and is stable. No new pathologic adenopathy. Reproductive: Rectal mass abuts the left seminal vesicle, unchanged from prior examination and the intervening flat pain appears obliterated, unchanged suggesting possible invasion of the structure. The prostate gland is unremarkable. Other: None Musculoskeletal: No acute bone abnormality. No lytic or blastic bone lesion. IMPRESSION: 1. Extensive bibasilar airway impaction and consolidation within the posterior basal lower lobes bilaterally, left greater than right, in keeping with changes of aspiration or multifocal infection. Superimposed bronchial wall thickening in keeping with airway inflammation. 2. Moderate emphysema. 3. Moderate multi-vessel coronary artery calcification. 4. Centrally necrotic rectal mass again identified with extensive local invasion of the presacral space and left posterolateral pelvic  sidewall. Stable mesorectal lymph node metastasis. Developing interstitial gas within the primary mass may reflect changes of treatment effect and increasing necrosis and/or superimposed infection. 5. Lower quadrant diverting sigmoid loop colostomy with prominent stomal prolapse and partial large bowel obstruction with point of transition at the stomal hiatus. Moderate retained stool proximally within the colon. 6. Interval enlargement of a single pulmonary nodule along the left major fissure, indeterminate. An isolated pulmonary metastasis is of low likelihood, but not completely excluded. Close attention on a follow-up surveillance imaging is warranted. Aortic Atherosclerosis (ICD10-I70.0) and Emphysema (ICD10-J43.9). Electronically Signed   By: Fidela Salisbury M.D.   On: 01/06/2023 21:46   DG Chest Port 1 View  Result Date: 01/06/2023 CLINICAL DATA:  Possible sepsis. EXAM: PORTABLE CHEST 1 VIEW COMPARISON:  February 23, 2022 FINDINGS: Since the prior study there is been interval placement  of a right-sided venous Port-A-Cath. Its distal tip is seen at the junction of the superior vena cava and right atrium. The heart size and mediastinal contours are within normal limits. Mild areas of atelectasis and/or infiltrate are seen within the bilateral lung bases. There is no evidence of a pleural effusion or pneumothorax. Multiple chronic left-sided rib fractures are noted, with a radiopaque fixation plate and screws seen overlying the left acromion and distal left clavicle. IMPRESSION: Interval right-sided venous Port-A-Cath placement positioning, as described above, without evidence of pneumothorax. Electronically Signed   By: Virgina Norfolk M.D.   On: 01/06/2023 20:30    ASSESSMENT: Stage IIa adenocarcinoma of the rectum.  PLAN:    Stage IIa adenocarcinoma of the rectum: Recent imaging suggest progressive disease despite receiving neoadjuvant chemotherapy with FOLFOX as well as neoadjuvant concurrent  capecitabine and XRT.  Surgical consult x 2, most recently at [facility], both agreed that patient's malignancy is no longer resectable.  Patient has changed his mind and wishes to pursue palliative chemotherapy using FOLFIRI plus Avastin every 2 weeks.  Delay cycle 3 of treatment secondary to recent hospitalization and decreased performance status.  Patient will instead receive IV fluids and electrolyte replacement.  Return to clinic in 1 week for further evaluation and reconsideration of cycle 3.  Will reimage after cycle 6.  Appreciate palliative care input. Pain: Patient does not complain of this today.  Continue fentanyl patch 50 mcg every 72 hours and oxycodone 10 mg as needed.   Anemia: Hemoglobin has trended down to 10.6, monitor. Thrombocytopenia: Resolved. Hyponatremia: Resolved.   Hypokalemia: Significantly worse.  Patient received 40 mEq IV potassium today.  Continue oral supplementation. Mouth pain/poor dentition: Patient does not complain of this today.   Neutropenia: Resolved.   Patient expressed understanding and was in agreement with this plan. He also understands that He can call clinic at any time with any questions, concerns, or complaints.    Cancer Staging  Adenocarcinoma of rectum [facility]) Staging form: Colon and Rectum, AJCC 8th Edition - Clinical stage from 03/10/2022: Stage IIA (cT3, cN0, cM0) - Signed by Lloyd Huger, MD on 03/10/2022 Stage prefix: Initial diagnosis Total positive nodes: 0  Lloyd Huger, MD   01/14/2023 2:40 PM

## 2023-01-15 LAB — URINE CULTURE: Culture: NO GROWTH

## 2023-01-16 ENCOUNTER — Inpatient Hospital Stay: Payer: Medicare Other

## 2023-01-16 ENCOUNTER — Inpatient Hospital Stay: Payer: Medicare Other | Admitting: Hospice and Palliative Medicine

## 2023-01-19 ENCOUNTER — Telehealth: Payer: Medicare Other | Admitting: Hospice and Palliative Medicine

## 2023-01-20 MED FILL — Dexamethasone Sodium Phosphate Inj 100 MG/10ML: INTRAMUSCULAR | Qty: 1 | Status: AC

## 2023-01-21 ENCOUNTER — Inpatient Hospital Stay: Payer: Medicare Other | Admitting: Oncology

## 2023-01-21 ENCOUNTER — Inpatient Hospital Stay: Payer: Medicare Other

## 2023-01-21 ENCOUNTER — Inpatient Hospital Stay: Payer: Medicare Other | Admitting: Hospice and Palliative Medicine

## 2023-01-23 ENCOUNTER — Inpatient Hospital Stay: Payer: Medicare Other | Attending: Oncology

## 2023-01-23 DEATH — deceased

## 2023-01-28 ENCOUNTER — Ambulatory Visit: Payer: Medicare Other

## 2023-01-28 ENCOUNTER — Ambulatory Visit: Payer: Medicare Other | Admitting: Oncology

## 2023-01-28 ENCOUNTER — Other Ambulatory Visit: Payer: Medicare Other

## 2023-01-31 ENCOUNTER — Encounter: Payer: Self-pay | Admitting: Oncology

## 2023-02-03 MED FILL — Dexamethasone Sodium Phosphate Inj 100 MG/10ML: INTRAMUSCULAR | Qty: 1 | Status: AC

## 2023-02-04 ENCOUNTER — Inpatient Hospital Stay: Payer: Medicare Other | Admitting: Oncology

## 2023-02-04 ENCOUNTER — Inpatient Hospital Stay: Payer: Medicare Other

## 2023-02-11 ENCOUNTER — Ambulatory Visit: Payer: Medicare Other | Admitting: Oncology

## 2023-02-11 ENCOUNTER — Ambulatory Visit: Payer: Medicare Other

## 2023-02-11 ENCOUNTER — Other Ambulatory Visit: Payer: Medicare Other

## 2023-02-18 ENCOUNTER — Other Ambulatory Visit: Payer: Medicare Other

## 2023-02-18 ENCOUNTER — Ambulatory Visit: Payer: Medicare Other | Admitting: Oncology

## 2023-02-18 ENCOUNTER — Ambulatory Visit: Payer: Medicare Other

## 2023-02-23 DEATH — deceased

## 2023-02-25 ENCOUNTER — Ambulatory Visit: Payer: Medicare Other

## 2023-02-25 ENCOUNTER — Other Ambulatory Visit: Payer: Medicare Other

## 2023-02-25 ENCOUNTER — Ambulatory Visit: Payer: Medicare Other | Admitting: Oncology

## 2023-03-04 ENCOUNTER — Ambulatory Visit: Payer: Medicare Other | Admitting: Oncology

## 2023-03-04 ENCOUNTER — Ambulatory Visit: Payer: Medicare Other

## 2023-03-04 ENCOUNTER — Other Ambulatory Visit: Payer: Medicare Other

## 2023-04-30 ENCOUNTER — Ambulatory Visit: Payer: Medicare Other | Admitting: Radiation Oncology
